# Patient Record
Sex: Female | Born: 1935 | Race: Black or African American | Hispanic: No | Smoking: Never smoker
Health system: Southern US, Community
[De-identification: ages and names within clinical notes are randomized; demographics above are authoritative.]

## PROBLEM LIST (undated history)

## (undated) DIAGNOSIS — I10 Essential (primary) hypertension: Secondary | ICD-10-CM

## (undated) DIAGNOSIS — Z9109 Other allergy status, other than to drugs and biological substances: Secondary | ICD-10-CM

## (undated) DIAGNOSIS — R7989 Other specified abnormal findings of blood chemistry: Secondary | ICD-10-CM

## (undated) DIAGNOSIS — E871 Hypo-osmolality and hyponatremia: Secondary | ICD-10-CM

## (undated) DIAGNOSIS — K219 Gastro-esophageal reflux disease without esophagitis: Secondary | ICD-10-CM

## (undated) DIAGNOSIS — N390 Urinary tract infection, site not specified: Secondary | ICD-10-CM

## (undated) DIAGNOSIS — R011 Cardiac murmur, unspecified: Secondary | ICD-10-CM

## (undated) DIAGNOSIS — K8020 Calculus of gallbladder without cholecystitis without obstruction: Secondary | ICD-10-CM

## (undated) DIAGNOSIS — J309 Allergic rhinitis, unspecified: Secondary | ICD-10-CM

## (undated) DIAGNOSIS — M1990 Unspecified osteoarthritis, unspecified site: Secondary | ICD-10-CM

## (undated) DIAGNOSIS — I2510 Atherosclerotic heart disease of native coronary artery without angina pectoris: Secondary | ICD-10-CM

## (undated) DIAGNOSIS — D499 Neoplasm of unspecified behavior of unspecified site: Principal | ICD-10-CM

## (undated) DIAGNOSIS — B351 Tinea unguium: Secondary | ICD-10-CM

## (undated) DIAGNOSIS — C801 Malignant (primary) neoplasm, unspecified: Secondary | ICD-10-CM

## (undated) HISTORY — DX: Allergic rhinitis, unspecified: J30.9

## (undated) HISTORY — PX: COLON SURGERY: SHX602

## (undated) HISTORY — PX: SMALL BOWEL REPAIR: SHX6447

## (undated) HISTORY — DX: Atherosclerotic heart disease of native coronary artery without angina pectoris: I25.10

## (undated) HISTORY — DX: Essential (primary) hypertension: I10

## (undated) HISTORY — DX: Tinea unguium: B35.1

## (undated) HISTORY — DX: Gastro-esophageal reflux disease without esophagitis: K21.9

## (undated) HISTORY — PX: CARDIAC SURGERY: SHX584

## (undated) HISTORY — DX: Neoplasm of unspecified behavior of unspecified site: D49.9

## (undated) HISTORY — DX: Cardiac murmur, unspecified: R01.1

## (undated) HISTORY — PX: TONSILLECTOMY: SUR1361

## (undated) HISTORY — DX: Calculus of gallbladder without cholecystitis without obstruction: K80.20

## (undated) HISTORY — DX: Other specified abnormal findings of blood chemistry: R79.89

## (undated) HISTORY — DX: Hypo-osmolality and hyponatremia: E87.1

---

## 2000-03-16 ENCOUNTER — Emergency Department (HOSPITAL_COMMUNITY): Admission: EM | Admit: 2000-03-16 | Discharge: 2000-03-16 | Payer: Self-pay | Admitting: Emergency Medicine

## 2000-11-01 ENCOUNTER — Encounter: Payer: Self-pay | Admitting: Emergency Medicine

## 2000-11-01 ENCOUNTER — Inpatient Hospital Stay (HOSPITAL_COMMUNITY): Admission: EM | Admit: 2000-11-01 | Discharge: 2000-11-19 | Payer: Self-pay | Admitting: Emergency Medicine

## 2000-11-01 ENCOUNTER — Encounter (INDEPENDENT_AMBULATORY_CARE_PROVIDER_SITE_OTHER): Payer: Self-pay

## 2000-11-02 ENCOUNTER — Encounter: Payer: Self-pay | Admitting: Internal Medicine

## 2000-11-11 ENCOUNTER — Encounter: Payer: Self-pay | Admitting: General Surgery

## 2000-11-12 ENCOUNTER — Encounter: Payer: Self-pay | Admitting: General Surgery

## 2000-11-14 ENCOUNTER — Encounter: Payer: Self-pay | Admitting: General Surgery

## 2000-12-04 ENCOUNTER — Encounter: Payer: Self-pay | Admitting: General Surgery

## 2000-12-04 ENCOUNTER — Encounter: Admission: RE | Admit: 2000-12-04 | Discharge: 2000-12-04 | Payer: Self-pay | Admitting: General Surgery

## 2001-04-01 ENCOUNTER — Encounter: Admission: RE | Admit: 2001-04-01 | Discharge: 2001-04-01 | Payer: Self-pay | Admitting: *Deleted

## 2001-04-01 ENCOUNTER — Encounter: Payer: Self-pay | Admitting: *Deleted

## 2001-09-14 ENCOUNTER — Encounter: Payer: Self-pay | Admitting: *Deleted

## 2001-09-14 ENCOUNTER — Ambulatory Visit (HOSPITAL_COMMUNITY): Admission: RE | Admit: 2001-09-14 | Discharge: 2001-09-14 | Payer: Self-pay | Admitting: *Deleted

## 2002-04-09 ENCOUNTER — Inpatient Hospital Stay (HOSPITAL_COMMUNITY): Admission: EM | Admit: 2002-04-09 | Discharge: 2002-04-11 | Payer: Self-pay | Admitting: Emergency Medicine

## 2002-04-09 ENCOUNTER — Encounter: Payer: Self-pay | Admitting: Emergency Medicine

## 2002-04-10 ENCOUNTER — Encounter: Payer: Self-pay | Admitting: Internal Medicine

## 2002-07-23 ENCOUNTER — Ambulatory Visit (HOSPITAL_COMMUNITY): Admission: RE | Admit: 2002-07-23 | Discharge: 2002-07-23 | Payer: Self-pay | Admitting: *Deleted

## 2002-07-23 ENCOUNTER — Encounter: Payer: Self-pay | Admitting: *Deleted

## 2004-06-18 ENCOUNTER — Encounter: Admission: RE | Admit: 2004-06-18 | Discharge: 2004-06-18 | Payer: Self-pay | Admitting: Internal Medicine

## 2004-08-22 ENCOUNTER — Encounter
Admission: RE | Admit: 2004-08-22 | Discharge: 2004-10-24 | Payer: Self-pay | Admitting: Physical Medicine & Rehabilitation

## 2004-08-24 ENCOUNTER — Ambulatory Visit: Payer: Self-pay | Admitting: Physical Medicine & Rehabilitation

## 2004-09-03 ENCOUNTER — Encounter
Admission: RE | Admit: 2004-09-03 | Discharge: 2004-09-26 | Payer: Self-pay | Admitting: Physical Medicine & Rehabilitation

## 2004-09-03 ENCOUNTER — Encounter
Admission: RE | Admit: 2004-09-03 | Discharge: 2004-09-03 | Payer: Self-pay | Admitting: Physical Medicine & Rehabilitation

## 2004-09-27 ENCOUNTER — Ambulatory Visit: Payer: Self-pay | Admitting: Internal Medicine

## 2004-11-01 ENCOUNTER — Ambulatory Visit: Payer: Self-pay | Admitting: Internal Medicine

## 2004-11-12 ENCOUNTER — Ambulatory Visit: Payer: Self-pay | Admitting: Internal Medicine

## 2005-01-03 ENCOUNTER — Ambulatory Visit: Payer: Self-pay | Admitting: Internal Medicine

## 2005-02-11 ENCOUNTER — Ambulatory Visit: Payer: Self-pay | Admitting: Internal Medicine

## 2005-02-15 ENCOUNTER — Ambulatory Visit: Payer: Self-pay | Admitting: Internal Medicine

## 2005-05-01 ENCOUNTER — Ambulatory Visit: Payer: Self-pay | Admitting: Internal Medicine

## 2005-05-14 ENCOUNTER — Ambulatory Visit: Payer: Self-pay | Admitting: Internal Medicine

## 2005-05-17 ENCOUNTER — Ambulatory Visit: Payer: Self-pay | Admitting: Cardiology

## 2005-05-28 ENCOUNTER — Ambulatory Visit: Payer: Self-pay | Admitting: Internal Medicine

## 2005-06-25 ENCOUNTER — Ambulatory Visit: Payer: Self-pay | Admitting: Internal Medicine

## 2005-07-23 ENCOUNTER — Ambulatory Visit: Payer: Self-pay | Admitting: Internal Medicine

## 2005-08-07 ENCOUNTER — Ambulatory Visit: Payer: Self-pay | Admitting: Internal Medicine

## 2005-09-04 ENCOUNTER — Ambulatory Visit: Payer: Self-pay | Admitting: Internal Medicine

## 2005-10-03 ENCOUNTER — Ambulatory Visit: Payer: Self-pay | Admitting: Internal Medicine

## 2005-10-10 ENCOUNTER — Ambulatory Visit: Payer: Self-pay | Admitting: Internal Medicine

## 2005-11-14 ENCOUNTER — Ambulatory Visit: Payer: Self-pay | Admitting: Internal Medicine

## 2005-11-28 ENCOUNTER — Ambulatory Visit: Payer: Self-pay | Admitting: Internal Medicine

## 2005-12-10 ENCOUNTER — Ambulatory Visit: Payer: Self-pay | Admitting: Internal Medicine

## 2005-12-24 ENCOUNTER — Ambulatory Visit: Payer: Self-pay | Admitting: Pulmonary Disease

## 2006-01-03 ENCOUNTER — Ambulatory Visit: Payer: Self-pay | Admitting: Internal Medicine

## 2006-02-05 ENCOUNTER — Ambulatory Visit: Payer: Self-pay | Admitting: Internal Medicine

## 2006-03-19 ENCOUNTER — Ambulatory Visit: Payer: Self-pay | Admitting: Internal Medicine

## 2006-06-19 ENCOUNTER — Ambulatory Visit: Payer: Self-pay | Admitting: Internal Medicine

## 2006-06-26 ENCOUNTER — Ambulatory Visit: Payer: Self-pay | Admitting: Internal Medicine

## 2006-07-02 ENCOUNTER — Ambulatory Visit: Payer: Self-pay | Admitting: Cardiovascular Disease

## 2006-07-04 ENCOUNTER — Ambulatory Visit: Payer: Self-pay | Admitting: Internal Medicine

## 2006-07-15 ENCOUNTER — Ambulatory Visit: Payer: Self-pay | Admitting: Gastroenterology

## 2006-08-18 ENCOUNTER — Ambulatory Visit: Payer: Self-pay | Admitting: Internal Medicine

## 2006-10-01 ENCOUNTER — Ambulatory Visit: Payer: Self-pay | Admitting: Internal Medicine

## 2006-10-01 LAB — CONVERTED CEMR LAB
Basophils Absolute: 0 10*3/uL (ref 0.0–0.1)
Basophils Relative: 0.1 % (ref 0.0–1.0)
Eosinophil percent: 1.6 % (ref 0.0–5.0)
HCT: 32 % — ABNORMAL LOW (ref 36.0–46.0)
Hemoglobin: 10.7 g/dL — ABNORMAL LOW (ref 12.0–15.0)
Lymphocytes Relative: 28.8 % (ref 12.0–46.0)
MCHC: 33.4 g/dL (ref 30.0–36.0)
MCV: 95.7 fL (ref 78.0–100.0)
Monocytes Absolute: 0.5 10*3/uL (ref 0.2–0.7)
Monocytes Relative: 12.8 % — ABNORMAL HIGH (ref 3.0–11.0)
Neutro Abs: 2.2 10*3/uL (ref 1.4–7.7)
Neutrophils Relative %: 56.7 % (ref 43.0–77.0)
Platelets: 382 10*3/uL (ref 150–400)
RBC: 3.34 M/uL — ABNORMAL LOW (ref 3.87–5.11)
RDW: 13.3 % (ref 11.5–14.6)
WBC: 4 10*3/uL — ABNORMAL LOW (ref 4.5–10.5)

## 2006-11-12 ENCOUNTER — Ambulatory Visit: Payer: Self-pay | Admitting: Internal Medicine

## 2006-11-12 LAB — CONVERTED CEMR LAB
ALT: 17 units/L (ref 0–40)
AST: 22 units/L (ref 0–37)
Albumin: 3.6 g/dL (ref 3.5–5.2)
Alkaline Phosphatase: 120 units/L — ABNORMAL HIGH (ref 39–117)
BUN: 14 mg/dL (ref 6–23)
Basophils Absolute: 0 10*3/uL (ref 0.0–0.1)
Basophils Relative: 0.4 % (ref 0.0–1.0)
Bilirubin, Direct: 0.1 mg/dL (ref 0.0–0.3)
CO2: 30 meq/L (ref 19–32)
Calcium: 9.3 mg/dL (ref 8.4–10.5)
Chloride: 102 meq/L (ref 96–112)
Cholesterol: 239 mg/dL (ref 0–200)
Creatinine, Ser: 1.4 mg/dL — ABNORMAL HIGH (ref 0.4–1.2)
Direct LDL: 166.8 mg/dL
Eosinophils Absolute: 0.1 10*3/uL (ref 0.0–0.6)
Eosinophils Relative: 2 % (ref 0.0–5.0)
GFR calc Af Amer: 46 mL/min
GFR calc non Af Amer: 38 mL/min
Glucose, Bld: 104 mg/dL — ABNORMAL HIGH (ref 70–99)
HCT: 32 % — ABNORMAL LOW (ref 36.0–46.0)
HDL: 53.6 mg/dL (ref >39.0)
Hemoglobin: 11 g/dL — ABNORMAL LOW (ref 12.0–15.0)
Lymphocytes Relative: 29.2 % (ref 12.0–46.0)
MCHC: 34.4 g/dL (ref 30.0–36.0)
MCV: 95.3 fL (ref 78.0–100.0)
Monocytes Absolute: 0.5 10*3/uL (ref 0.2–0.7)
Monocytes Relative: 12.7 % — ABNORMAL HIGH (ref 3.0–11.0)
Neutro Abs: 2.1 10*3/uL (ref 1.4–7.7)
Neutrophils Relative %: 55.7 % (ref 43.0–77.0)
Platelets: 394 10*3/uL (ref 150–400)
Potassium: 4 meq/L (ref 3.5–5.1)
RBC: 3.36 M/uL — ABNORMAL LOW (ref 3.87–5.11)
RDW: 13.3 % (ref 11.5–14.6)
Sodium: 137 meq/L (ref 135–145)
Total Bilirubin: 0.8 mg/dL (ref 0.3–1.2)
Total CHOL/HDL Ratio: 4.5
Total Protein: 7.1 g/dL (ref 6.0–8.3)
Triglycerides: 86 mg/dL (ref 0–149)
VLDL: 17 mg/dL (ref 0–40)
WBC: 3.8 10*3/uL — ABNORMAL LOW (ref 4.5–10.5)

## 2007-02-11 ENCOUNTER — Ambulatory Visit: Payer: Self-pay | Admitting: Internal Medicine

## 2007-02-11 LAB — CONVERTED CEMR LAB
ALT: 12 units/L (ref 0–40)
AST: 20 units/L (ref 0–37)
Albumin: 3.6 g/dL (ref 3.5–5.2)
Alkaline Phosphatase: 103 units/L (ref 39–117)
BUN: 17 mg/dL (ref 6–23)
Basophils Absolute: 0 10*3/uL (ref 0.0–0.1)
Basophils Relative: 0.1 % (ref 0.0–1.0)
Bilirubin, Direct: 0.1 mg/dL (ref 0.0–0.3)
CO2: 29 meq/L (ref 19–32)
Calcium: 9.2 mg/dL (ref 8.4–10.5)
Chloride: 103 meq/L (ref 96–112)
Creatinine, Ser: 1.4 mg/dL — ABNORMAL HIGH (ref 0.4–1.2)
Eosinophils Absolute: 0.1 10*3/uL (ref 0.0–0.6)
Eosinophils Relative: 1.5 % (ref 0.0–5.0)
Ferritin: 65.6 ng/mL (ref 10.0–291.0)
Folate: 18.1 ng/mL
GFR calc Af Amer: 46 mL/min
GFR calc non Af Amer: 38 mL/min
Glucose, Bld: 94 mg/dL (ref 70–99)
HCT: 30.1 % — ABNORMAL LOW (ref 36.0–46.0)
Hemoglobin: 10.4 g/dL — ABNORMAL LOW (ref 12.0–15.0)
Lymphocytes Relative: 26.6 % (ref 12.0–46.0)
MCHC: 34.6 g/dL (ref 30.0–36.0)
MCV: 92.1 fL (ref 78.0–100.0)
Monocytes Absolute: 0.5 10*3/uL (ref 0.2–0.7)
Monocytes Relative: 10.9 % (ref 3.0–11.0)
Neutro Abs: 2.6 10*3/uL (ref 1.4–7.7)
Neutrophils Relative %: 60.9 % (ref 43.0–77.0)
Platelets: 376 10*3/uL (ref 150–400)
Potassium: 4 meq/L (ref 3.5–5.1)
RBC: 3.27 M/uL — ABNORMAL LOW (ref 3.87–5.11)
RDW: 13.4 % (ref 11.5–14.6)
Sodium: 138 meq/L (ref 135–145)
Total Bilirubin: 0.7 mg/dL (ref 0.3–1.2)
Total Protein: 7.2 g/dL (ref 6.0–8.3)
Vitamin B-12: 427 pg/mL (ref 211–911)
WBC: 4.3 10*3/uL — ABNORMAL LOW (ref 4.5–10.5)

## 2007-02-26 ENCOUNTER — Ambulatory Visit: Payer: Self-pay | Admitting: Internal Medicine

## 2007-04-09 ENCOUNTER — Ambulatory Visit: Payer: Self-pay | Admitting: Internal Medicine

## 2007-04-13 DIAGNOSIS — I10 Essential (primary) hypertension: Secondary | ICD-10-CM

## 2007-04-13 DIAGNOSIS — R7989 Other specified abnormal findings of blood chemistry: Secondary | ICD-10-CM

## 2007-04-13 DIAGNOSIS — J309 Allergic rhinitis, unspecified: Secondary | ICD-10-CM

## 2007-04-13 DIAGNOSIS — D499 Neoplasm of unspecified behavior of unspecified site: Secondary | ICD-10-CM

## 2007-04-13 DIAGNOSIS — K219 Gastro-esophageal reflux disease without esophagitis: Secondary | ICD-10-CM

## 2007-04-13 DIAGNOSIS — C7A00 Malignant carcinoid tumor of unspecified site: Secondary | ICD-10-CM

## 2007-04-13 HISTORY — DX: Allergic rhinitis, unspecified: J30.9

## 2007-04-13 HISTORY — DX: Essential (primary) hypertension: I10

## 2007-04-13 HISTORY — DX: Neoplasm of unspecified behavior of unspecified site: D49.9

## 2007-04-13 HISTORY — DX: Other specified abnormal findings of blood chemistry: R79.89

## 2007-04-13 HISTORY — DX: Gastro-esophageal reflux disease without esophagitis: K21.9

## 2007-04-20 ENCOUNTER — Ambulatory Visit: Payer: Self-pay | Admitting: Internal Medicine

## 2007-06-12 ENCOUNTER — Ambulatory Visit: Payer: Self-pay | Admitting: Internal Medicine

## 2007-06-12 LAB — CONVERTED CEMR LAB
BUN: 16 mg/dL (ref 6–23)
CO2: 28 meq/L (ref 19–32)
Calcium: 9.4 mg/dL (ref 8.4–10.5)
Chloride: 99 meq/L (ref 96–112)
Creatinine, Ser: 1.4 mg/dL — ABNORMAL HIGH (ref 0.4–1.2)
GFR calc Af Amer: 46 mL/min
GFR calc non Af Amer: 38 mL/min
Glucose, Bld: 99 mg/dL (ref 70–99)
Hgb A1c MFr Bld: 5.8 % (ref 4.6–6.0)
Potassium: 4.4 meq/L (ref 3.5–5.1)
Sodium: 135 meq/L (ref 135–145)

## 2007-07-20 ENCOUNTER — Ambulatory Visit: Payer: Self-pay | Admitting: Internal Medicine

## 2007-07-20 LAB — CONVERTED CEMR LAB
BUN: 16 mg/dL (ref 6–23)
CO2: 28 meq/L (ref 19–32)
Calcium: 9.4 mg/dL (ref 8.4–10.5)
Chloride: 103 meq/L (ref 96–112)
Creatinine, Ser: 1.4 mg/dL — ABNORMAL HIGH (ref 0.4–1.2)
GFR calc Af Amer: 46 mL/min
GFR calc non Af Amer: 38 mL/min
Glucose, Bld: 99 mg/dL (ref 70–99)
Potassium: 4.4 meq/L (ref 3.5–5.1)
Sodium: 137 meq/L (ref 135–145)

## 2007-07-22 ENCOUNTER — Ambulatory Visit: Payer: Self-pay | Admitting: Cardiology

## 2007-08-06 ENCOUNTER — Ambulatory Visit: Payer: Self-pay | Admitting: Pulmonary Disease

## 2007-08-06 ENCOUNTER — Encounter: Payer: Self-pay | Admitting: Internal Medicine

## 2007-08-19 ENCOUNTER — Ambulatory Visit: Payer: Self-pay | Admitting: Internal Medicine

## 2007-08-20 ENCOUNTER — Ambulatory Visit: Payer: Self-pay | Admitting: Oncology

## 2007-08-20 LAB — CONVERTED CEMR LAB
Basophils Absolute: 0 10*3/uL (ref 0.0–0.1)
Basophils Relative: 1 % (ref 0.0–1.0)
Eosinophils Absolute: 0 10*3/uL (ref 0.0–0.6)
Eosinophils Relative: 1 % (ref 0.0–5.0)
HCT: 31.1 % — ABNORMAL LOW (ref 36.0–46.0)
Hemoglobin: 10.7 g/dL — ABNORMAL LOW (ref 12.0–15.0)
Lymphocytes Relative: 31.8 % (ref 12.0–46.0)
MCHC: 34.4 g/dL (ref 30.0–36.0)
MCV: 95.1 fL (ref 78.0–100.0)
Monocytes Absolute: 0.5 10*3/uL (ref 0.2–0.7)
Monocytes Relative: 11 % (ref 3.0–11.0)
Neutro Abs: 2.7 10*3/uL (ref 1.4–7.7)
Neutrophils Relative %: 55.2 % (ref 43.0–77.0)
Platelets: 378 10*3/uL (ref 150–400)
RBC: 3.27 M/uL — ABNORMAL LOW (ref 3.87–5.11)
RDW: 13.4 % (ref 11.5–14.6)
WBC: 4.7 10*3/uL (ref 4.5–10.5)

## 2007-08-21 ENCOUNTER — Telehealth: Payer: Self-pay | Admitting: Internal Medicine

## 2007-09-01 ENCOUNTER — Ambulatory Visit: Payer: Self-pay | Admitting: Oncology

## 2007-10-09 ENCOUNTER — Encounter: Payer: Self-pay | Admitting: Internal Medicine

## 2007-10-09 LAB — CBC WITH DIFFERENTIAL/PLATELET
BASO%: 0.1 % (ref 0.0–2.0)
EOS%: 1.8 % (ref 0.0–7.0)
MCH: 32 pg (ref 26.0–34.0)
MCHC: 34.8 g/dL (ref 32.0–36.0)
MONO#: 0.1 10*3/uL (ref 0.1–0.9)
RBC: 3.36 10*6/uL — ABNORMAL LOW (ref 3.70–5.32)
WBC: 4.7 10*3/uL (ref 3.9–10.0)
lymph#: 0.8 10*3/uL — ABNORMAL LOW (ref 0.9–3.3)

## 2007-10-09 LAB — CHCC SMEAR

## 2007-10-09 LAB — RETICULOCYTES: Retic %: 1.7 % (ref 0.4–2.3)

## 2007-10-13 LAB — COMPREHENSIVE METABOLIC PANEL
ALT: 13 U/L (ref 0–35)
AST: 17 U/L (ref 0–37)
Albumin: 4 g/dL (ref 3.5–5.2)
Alkaline Phosphatase: 126 U/L — ABNORMAL HIGH (ref 39–117)
BUN: 21 mg/dL (ref 6–23)
Calcium: 9.5 mg/dL (ref 8.4–10.5)
Chloride: 103 mEq/L (ref 96–112)
Creatinine, Ser: 1.22 mg/dL — ABNORMAL HIGH (ref 0.40–1.20)
Potassium: 4.3 mEq/L (ref 3.5–5.3)

## 2007-10-13 LAB — SPEP & IFE WITH QIG
Alpha-2-Globulin: 13 % — ABNORMAL HIGH (ref 7.1–11.8)
Beta Globulin: 7.4 % — ABNORMAL HIGH (ref 4.7–7.2)
Gamma Globulin: 15.5 % (ref 11.1–18.8)
IgA: 636 mg/dL — ABNORMAL HIGH (ref 68–378)
IgM, Serum: 117 mg/dL (ref 60–263)
Total Protein, Serum Electrophoresis: 7.8 g/dL (ref 6.0–8.3)

## 2007-11-19 ENCOUNTER — Ambulatory Visit: Payer: Self-pay | Admitting: Internal Medicine

## 2007-11-19 LAB — CONVERTED CEMR LAB
BUN: 14 mg/dL (ref 6–23)
Basophils Relative: 0.3 % (ref 0.0–1.0)
CO2: 26 meq/L (ref 19–32)
Calcium: 9.5 mg/dL (ref 8.4–10.5)
Chloride: 104 meq/L (ref 96–112)
Creatinine, Ser: 1.3 mg/dL — ABNORMAL HIGH (ref 0.4–1.2)
Eosinophils Relative: 2 % (ref 0.0–5.0)
GFR calc Af Amer: 50 mL/min
GFR calc non Af Amer: 41 mL/min
Glucose, Bld: 93 mg/dL (ref 70–99)
HCT: 32.6 % — ABNORMAL LOW (ref 36.0–46.0)
Hemoglobin: 10.7 g/dL — ABNORMAL LOW (ref 12.0–15.0)
Hgb A1c MFr Bld: 6 % (ref 4.6–6.0)
Lymphocytes Relative: 18.4 % (ref 12.0–46.0)
MCHC: 32.7 g/dL (ref 30.0–36.0)
MCV: 93.8 fL (ref 78.0–100.0)
Monocytes Relative: 7.9 % (ref 3.0–11.0)
Neutrophils Relative %: 71.4 % (ref 43.0–77.0)
Platelets: 431 10*3/uL — ABNORMAL HIGH (ref 150–400)
Potassium: 4.3 meq/L (ref 3.5–5.1)
RBC: 3.47 M/uL — ABNORMAL LOW (ref 3.87–5.11)
RDW: 13.1 % (ref 11.5–14.6)
Sodium: 139 meq/L (ref 135–145)
WBC: 6.8 10*3/uL (ref 4.5–10.5)

## 2007-11-27 ENCOUNTER — Ambulatory Visit: Payer: Self-pay | Admitting: Internal Medicine

## 2008-01-12 ENCOUNTER — Ambulatory Visit: Payer: Self-pay | Admitting: Oncology

## 2008-01-14 ENCOUNTER — Encounter: Payer: Self-pay | Admitting: Internal Medicine

## 2008-03-22 ENCOUNTER — Ambulatory Visit: Payer: Self-pay | Admitting: Internal Medicine

## 2008-03-22 LAB — CONVERTED CEMR LAB
BUN: 21 mg/dL (ref 6–23)
Basophils Absolute: 0 10*3/uL (ref 0.0–0.1)
Basophils Relative: 0.5 % (ref 0.0–1.0)
CO2: 28 meq/L (ref 19–32)
Calcium: 9.6 mg/dL (ref 8.4–10.5)
Chloride: 98 meq/L (ref 96–112)
Creatinine, Ser: 1.5 mg/dL — ABNORMAL HIGH (ref 0.4–1.2)
Eosinophils Absolute: 0.1 10*3/uL (ref 0.0–0.7)
Eosinophils Relative: 1.8 % (ref 0.0–5.0)
GFR calc Af Amer: 42 mL/min
GFR calc non Af Amer: 35 mL/min
Glucose, Bld: 99 mg/dL (ref 70–99)
HCT: 31.8 % — ABNORMAL LOW (ref 36.0–46.0)
Hemoglobin: 10.5 g/dL — ABNORMAL LOW (ref 12.0–15.0)
Hgb A1c MFr Bld: 5.9 % (ref 4.6–6.0)
Lymphocytes Relative: 26.3 % (ref 12.0–46.0)
MCHC: 33 g/dL (ref 30.0–36.0)
MCV: 94.5 fL (ref 78.0–100.0)
Monocytes Absolute: 0.4 10*3/uL (ref 0.1–1.0)
Monocytes Relative: 10.2 % (ref 3.0–12.0)
Neutro Abs: 2.6 10*3/uL (ref 1.4–7.7)
Neutrophils Relative %: 61.2 % (ref 43.0–77.0)
Platelets: 358 10*3/uL (ref 150–400)
Potassium: 3.9 meq/L (ref 3.5–5.1)
RBC: 3.36 M/uL — ABNORMAL LOW (ref 3.87–5.11)
RDW: 14.5 % (ref 11.5–14.6)
Sodium: 138 meq/L (ref 135–145)
WBC: 4.2 10*3/uL — ABNORMAL LOW (ref 4.5–10.5)

## 2008-04-08 ENCOUNTER — Telehealth: Payer: Self-pay | Admitting: Internal Medicine

## 2008-07-12 ENCOUNTER — Ambulatory Visit: Payer: Self-pay | Admitting: Oncology

## 2008-07-29 ENCOUNTER — Ambulatory Visit: Payer: Self-pay | Admitting: Internal Medicine

## 2008-08-01 LAB — CONVERTED CEMR LAB
ALT: 16 units/L (ref 0–35)
AST: 23 units/L (ref 0–37)
BUN: 21 mg/dL (ref 6–23)
Basophils Absolute: 0 10*3/uL (ref 0.0–0.1)
Basophils Relative: 0.2 % (ref 0.0–3.0)
CO2: 27 meq/L (ref 19–32)
Calcium: 9.6 mg/dL (ref 8.4–10.5)
Chloride: 105 meq/L (ref 96–112)
Cholesterol: 226 mg/dL (ref 0–200)
Creatinine, Ser: 1.4 mg/dL — ABNORMAL HIGH (ref 0.4–1.2)
Direct LDL: 136 mg/dL
Eosinophils Absolute: 0.1 10*3/uL (ref 0.0–0.7)
Eosinophils Relative: 2.4 % (ref 0.0–5.0)
GFR calc Af Amer: 46 mL/min
GFR calc non Af Amer: 38 mL/min
Glucose, Bld: 97 mg/dL (ref 70–99)
HCT: 32.1 % — ABNORMAL LOW (ref 36.0–46.0)
HDL: 64.2 mg/dL (ref >39.0)
Hemoglobin: 10.9 g/dL — ABNORMAL LOW (ref 12.0–15.0)
Hgb A1c MFr Bld: 6 % (ref 4.6–6.0)
Lymphocytes Relative: 30 % (ref 12.0–46.0)
MCHC: 34 g/dL (ref 30.0–36.0)
MCV: 95.3 fL (ref 78.0–100.0)
Monocytes Absolute: 0.5 10*3/uL (ref 0.1–1.0)
Monocytes Relative: 11.4 % (ref 3.0–12.0)
Neutro Abs: 2.3 10*3/uL (ref 1.4–7.7)
Neutrophils Relative %: 56 % (ref 43.0–77.0)
Platelets: 347 10*3/uL (ref 150–400)
Potassium: 4.3 meq/L (ref 3.5–5.1)
RBC: 3.36 M/uL — ABNORMAL LOW (ref 3.87–5.11)
RDW: 13.8 % (ref 11.5–14.6)
Sodium: 140 meq/L (ref 135–145)
Total CHOL/HDL Ratio: 3.5
Triglycerides: 82 mg/dL (ref 0–149)
VLDL: 16 mg/dL (ref 0–40)
WBC: 4.1 10*3/uL — ABNORMAL LOW (ref 4.5–10.5)

## 2008-08-15 ENCOUNTER — Encounter: Payer: Self-pay | Admitting: Internal Medicine

## 2008-08-15 LAB — CBC WITH DIFFERENTIAL/PLATELET
BASO%: 1 % (ref 0.0–2.0)
EOS%: 2.2 % (ref 0.0–7.0)
HCT: 31.2 % — ABNORMAL LOW (ref 34.8–46.6)
LYMPH%: 26.6 % (ref 14.0–48.0)
MCH: 31.9 pg (ref 26.0–34.0)
MCHC: 34.3 g/dL (ref 32.0–36.0)
MCV: 93.1 fL (ref 81.0–101.0)
MONO#: 0.6 10*3/uL (ref 0.1–0.9)
MONO%: 11.7 % (ref 0.0–13.0)
NEUT%: 58.6 % (ref 39.6–76.8)
Platelets: 369 10*3/uL (ref 145–400)
RBC: 3.36 10*6/uL — ABNORMAL LOW (ref 3.70–5.32)
WBC: 4.7 10*3/uL (ref 3.9–10.0)

## 2008-12-02 ENCOUNTER — Ambulatory Visit: Payer: Self-pay | Admitting: Internal Medicine

## 2008-12-08 ENCOUNTER — Telehealth: Payer: Self-pay | Admitting: Internal Medicine

## 2008-12-08 LAB — CONVERTED CEMR LAB
ALT: 17 units/L (ref 0–35)
AST: 22 units/L (ref 0–37)
BUN: 10 mg/dL (ref 6–23)
Basophils Absolute: 0.1 10*3/uL (ref 0.0–0.1)
Basophils Relative: 2.9 % (ref 0.0–3.0)
CO2: 28 meq/L (ref 19–32)
Calcium: 9.3 mg/dL (ref 8.4–10.5)
Chloride: 105 meq/L (ref 96–112)
Cholesterol: 222 mg/dL (ref 0–200)
Creatinine, Ser: 1.3 mg/dL — ABNORMAL HIGH (ref 0.4–1.2)
Direct LDL: 141.6 mg/dL
Eosinophils Absolute: 0.1 10*3/uL (ref 0.0–0.7)
Eosinophils Relative: 1.8 % (ref 0.0–5.0)
GFR calc Af Amer: 50 mL/min
GFR calc non Af Amer: 41 mL/min
Glucose, Bld: 101 mg/dL — ABNORMAL HIGH (ref 70–99)
HCT: 31.9 % — ABNORMAL LOW (ref 36.0–46.0)
HDL: 63.8 mg/dL (ref >39.0)
Hemoglobin: 10.8 g/dL — ABNORMAL LOW (ref 12.0–15.0)
Hgb A1c MFr Bld: 6 % (ref 4.6–6.0)
LDL Cholesterol: 147 mg/dL — ABNORMAL HIGH (ref 0–99)
Lymphocytes Relative: 26.8 % (ref 12.0–46.0)
MCHC: 34 g/dL (ref 30.0–36.0)
MCV: 96.7 fL (ref 78.0–100.0)
Monocytes Absolute: 0.4 10*3/uL (ref 0.1–1.0)
Monocytes Relative: 9.6 % (ref 3.0–12.0)
Neutro Abs: 2.6 10*3/uL (ref 1.4–7.7)
Neutrophils Relative %: 58.9 % (ref 43.0–77.0)
Platelets: 354 10*3/uL (ref 150–400)
Potassium: 4.6 meq/L (ref 3.5–5.1)
RBC: 3.3 M/uL — ABNORMAL LOW (ref 3.87–5.11)
RDW: 13.7 % (ref 11.5–14.6)
Sodium: 139 meq/L (ref 135–145)
Total CHOL/HDL Ratio: 3.5
Triglycerides: 57 mg/dL (ref 0–149)
VLDL: 11 mg/dL (ref 0–40)
WBC: 4.4 10*3/uL — ABNORMAL LOW (ref 4.5–10.5)

## 2009-01-11 ENCOUNTER — Telehealth: Payer: Self-pay | Admitting: Internal Medicine

## 2009-01-24 ENCOUNTER — Emergency Department (HOSPITAL_COMMUNITY): Admission: EM | Admit: 2009-01-24 | Discharge: 2009-01-24 | Payer: Self-pay | Admitting: Emergency Medicine

## 2009-01-26 ENCOUNTER — Ambulatory Visit: Payer: Self-pay | Admitting: Family Medicine

## 2009-02-09 ENCOUNTER — Ambulatory Visit: Payer: Self-pay | Admitting: Oncology

## 2009-02-13 ENCOUNTER — Encounter: Payer: Self-pay | Admitting: Internal Medicine

## 2009-02-13 LAB — CBC WITH DIFFERENTIAL/PLATELET
Basophils Absolute: 0 10*3/uL (ref 0.0–0.1)
EOS%: 2 % (ref 0.0–7.0)
HGB: 10.8 g/dL — ABNORMAL LOW (ref 11.6–15.9)
LYMPH%: 22.7 % (ref 14.0–49.7)
MCH: 30.3 pg (ref 25.1–34.0)
MCV: 89.3 fL (ref 79.5–101.0)
MONO%: 15.2 % — ABNORMAL HIGH (ref 0.0–14.0)
Platelets: 462 10*3/uL — ABNORMAL HIGH (ref 145–400)
RDW: 13.4 % (ref 11.2–14.5)

## 2009-02-17 ENCOUNTER — Ambulatory Visit: Payer: Self-pay | Admitting: Internal Medicine

## 2009-02-22 ENCOUNTER — Telehealth: Payer: Self-pay | Admitting: Internal Medicine

## 2009-03-03 ENCOUNTER — Encounter: Payer: Self-pay | Admitting: Internal Medicine

## 2009-03-11 ENCOUNTER — Emergency Department (HOSPITAL_COMMUNITY): Admission: EM | Admit: 2009-03-11 | Discharge: 2009-03-11 | Payer: Self-pay | Admitting: Emergency Medicine

## 2009-06-23 ENCOUNTER — Ambulatory Visit: Payer: Self-pay | Admitting: Internal Medicine

## 2009-07-03 LAB — CONVERTED CEMR LAB
BUN: 18 mg/dL (ref 6–23)
Basophils Absolute: 0 10*3/uL (ref 0.0–0.1)
Basophils Relative: 0.4 % (ref 0.0–3.0)
CO2: 29 meq/L (ref 19–32)
Calcium: 9.3 mg/dL (ref 8.4–10.5)
Chloride: 104 meq/L (ref 96–112)
Creatinine, Ser: 1.4 mg/dL — ABNORMAL HIGH (ref 0.4–1.2)
Eosinophils Absolute: 0.1 10*3/uL (ref 0.0–0.7)
Eosinophils Relative: 1.7 % (ref 0.0–5.0)
GFR calc non Af Amer: 45.69 mL/min (ref >60)
Glucose, Bld: 95 mg/dL (ref 70–99)
HCT: 32.6 % — ABNORMAL LOW (ref 36.0–46.0)
Hemoglobin: 11.1 g/dL — ABNORMAL LOW (ref 12.0–15.0)
Lymphocytes Relative: 34.9 % (ref 12.0–46.0)
Lymphs Abs: 1.5 10*3/uL (ref 0.7–4.0)
MCHC: 34.1 g/dL (ref 30.0–36.0)
MCV: 94 fL (ref 78.0–100.0)
Monocytes Absolute: 0.5 10*3/uL (ref 0.1–1.0)
Monocytes Relative: 11.8 % (ref 3.0–12.0)
Neutro Abs: 2.2 10*3/uL (ref 1.4–7.7)
Neutrophils Relative %: 51.2 % (ref 43.0–77.0)
Platelets: 333 10*3/uL (ref 150.0–400.0)
Potassium: 4.3 meq/L (ref 3.5–5.1)
RBC: 3.46 M/uL — ABNORMAL LOW (ref 3.87–5.11)
RDW: 14.5 % (ref 11.5–14.6)
Sodium: 140 meq/L (ref 135–145)
TSH: 4.41 microintl units/mL (ref 0.35–5.50)
WBC: 4.3 10*3/uL — ABNORMAL LOW (ref 4.5–10.5)

## 2009-09-27 ENCOUNTER — Telehealth: Payer: Self-pay | Admitting: Internal Medicine

## 2009-10-17 ENCOUNTER — Telehealth: Payer: Self-pay | Admitting: Internal Medicine

## 2009-10-19 ENCOUNTER — Ambulatory Visit: Payer: Self-pay | Admitting: Internal Medicine

## 2009-10-24 LAB — CONVERTED CEMR LAB
BUN: 13 mg/dL (ref 6–23)
Basophils Absolute: 0 10*3/uL (ref 0.0–0.1)
Basophils Relative: 0.7 % (ref 0.0–3.0)
CO2: 27 meq/L (ref 19–32)
Calcium: 9.4 mg/dL (ref 8.4–10.5)
Chloride: 100 meq/L (ref 96–112)
Creatinine, Ser: 1.2 mg/dL (ref 0.4–1.2)
Eosinophils Absolute: 0.1 10*3/uL (ref 0.0–0.7)
Eosinophils Relative: 2.3 % (ref 0.0–5.0)
GFR calc non Af Amer: 54.55 mL/min (ref >60)
Glucose, Bld: 108 mg/dL — ABNORMAL HIGH (ref 70–99)
HCT: 35.5 % — ABNORMAL LOW (ref 36.0–46.0)
Hemoglobin: 12 g/dL (ref 12.0–15.0)
Lymphocytes Relative: 29.8 % (ref 12.0–46.0)
Lymphs Abs: 1.3 10*3/uL (ref 0.7–4.0)
MCHC: 33.8 g/dL (ref 30.0–36.0)
MCV: 97.7 fL (ref 78.0–100.0)
Monocytes Absolute: 0.5 10*3/uL (ref 0.1–1.0)
Monocytes Relative: 11.1 % (ref 3.0–12.0)
Neutro Abs: 2.3 10*3/uL (ref 1.4–7.7)
Neutrophils Relative %: 56.1 % (ref 43.0–77.0)
Platelets: 337 10*3/uL (ref 150.0–400.0)
Potassium: 4.4 meq/L (ref 3.5–5.1)
RBC: 3.64 M/uL — ABNORMAL LOW (ref 3.87–5.11)
RDW: 13.7 % (ref 11.5–14.6)
Sodium: 137 meq/L (ref 135–145)
TSH: 4.86 microintl units/mL (ref 0.35–5.50)
WBC: 4.2 10*3/uL — ABNORMAL LOW (ref 4.5–10.5)

## 2009-11-08 ENCOUNTER — Ambulatory Visit: Payer: Self-pay | Admitting: Oncology

## 2009-11-10 ENCOUNTER — Encounter: Payer: Self-pay | Admitting: Internal Medicine

## 2009-11-10 LAB — CBC WITH DIFFERENTIAL/PLATELET
BASO%: 0.5 % (ref 0.0–2.0)
Basophils Absolute: 0 10*3/uL (ref 0.0–0.1)
EOS%: 2.5 % (ref 0.0–7.0)
Eosinophils Absolute: 0.1 10*3/uL (ref 0.0–0.5)
HCT: 33.7 % — ABNORMAL LOW (ref 34.8–46.6)
HGB: 11.6 g/dL (ref 11.6–15.9)
LYMPH%: 29.1 % (ref 14.0–49.7)
MCH: 32.8 pg (ref 25.1–34.0)
MCHC: 34.5 g/dL (ref 31.5–36.0)
MCV: 95.2 fL (ref 79.5–101.0)
MONO#: 0.6 10*3/uL (ref 0.1–0.9)
MONO%: 13.6 % (ref 0.0–14.0)
NEUT#: 2.6 10*3/uL (ref 1.5–6.5)
NEUT%: 54.3 % (ref 38.4–76.8)
Platelets: 376 10*3/uL (ref 145–400)
RBC: 3.53 10*6/uL — ABNORMAL LOW (ref 3.70–5.45)
RDW: 14.4 % (ref 11.2–14.5)
WBC: 4.7 10*3/uL (ref 3.9–10.3)
lymph#: 1.4 10*3/uL (ref 0.9–3.3)

## 2010-02-05 ENCOUNTER — Ambulatory Visit: Payer: Self-pay | Admitting: Internal Medicine

## 2010-02-05 DIAGNOSIS — M549 Dorsalgia, unspecified: Secondary | ICD-10-CM | POA: Insufficient documentation

## 2010-02-08 ENCOUNTER — Telehealth: Payer: Self-pay | Admitting: Internal Medicine

## 2010-03-08 ENCOUNTER — Telehealth: Payer: Self-pay | Admitting: Internal Medicine

## 2010-04-19 ENCOUNTER — Emergency Department (HOSPITAL_COMMUNITY): Admission: EM | Admit: 2010-04-19 | Discharge: 2010-04-19 | Payer: Self-pay | Admitting: Emergency Medicine

## 2010-05-02 ENCOUNTER — Ambulatory Visit: Payer: Self-pay | Admitting: Internal Medicine

## 2010-05-02 DIAGNOSIS — K8020 Calculus of gallbladder without cholecystitis without obstruction: Secondary | ICD-10-CM | POA: Insufficient documentation

## 2010-05-02 HISTORY — DX: Calculus of gallbladder without cholecystitis without obstruction: K80.20

## 2010-06-04 ENCOUNTER — Ambulatory Visit: Payer: Self-pay | Admitting: Internal Medicine

## 2010-06-05 LAB — CONVERTED CEMR LAB
ALT: 18 units/L (ref 0–35)
AST: 21 units/L (ref 0–37)
Albumin: 3.9 g/dL (ref 3.5–5.2)
Alkaline Phosphatase: 120 units/L — ABNORMAL HIGH (ref 39–117)
BUN: 21 mg/dL (ref 6–23)
Basophils Absolute: 0 10*3/uL (ref 0.0–0.1)
Basophils Relative: 0.1 % (ref 0.0–3.0)
Bilirubin, Direct: 0.1 mg/dL (ref 0.0–0.3)
CO2: 25 meq/L (ref 19–32)
Calcium: 9.6 mg/dL (ref 8.4–10.5)
Chloride: 100 meq/L (ref 96–112)
Creatinine, Ser: 1.3 mg/dL — ABNORMAL HIGH (ref 0.4–1.2)
Eosinophils Absolute: 0.1 10*3/uL (ref 0.0–0.7)
Eosinophils Relative: 1.6 % (ref 0.0–5.0)
GFR calc non Af Amer: 49.66 mL/min (ref >60)
Glucose, Bld: 97 mg/dL (ref 70–99)
HCT: 33.6 % — ABNORMAL LOW (ref 36.0–46.0)
Hemoglobin: 11.5 g/dL — ABNORMAL LOW (ref 12.0–15.0)
Lymphocytes Relative: 26 % (ref 12.0–46.0)
Lymphs Abs: 1.2 10*3/uL (ref 0.7–4.0)
MCHC: 34.2 g/dL (ref 30.0–36.0)
MCV: 95.1 fL (ref 78.0–100.0)
Monocytes Absolute: 0.5 10*3/uL (ref 0.1–1.0)
Monocytes Relative: 10.5 % (ref 3.0–12.0)
Neutro Abs: 2.9 10*3/uL (ref 1.4–7.7)
Neutrophils Relative %: 61.8 % (ref 43.0–77.0)
Platelets: 388 10*3/uL (ref 150.0–400.0)
Potassium: 5 meq/L (ref 3.5–5.1)
RBC: 3.53 M/uL — ABNORMAL LOW (ref 3.87–5.11)
RDW: 15.1 % — ABNORMAL HIGH (ref 11.5–14.6)
Sodium: 135 meq/L (ref 135–145)
Total Bilirubin: 0.5 mg/dL (ref 0.3–1.2)
Total Protein: 7.1 g/dL (ref 6.0–8.3)
WBC: 4.7 10*3/uL (ref 4.5–10.5)

## 2010-07-03 ENCOUNTER — Telehealth: Payer: Self-pay | Admitting: Internal Medicine

## 2010-07-05 ENCOUNTER — Ambulatory Visit: Payer: Self-pay | Admitting: Oncology

## 2010-07-10 ENCOUNTER — Encounter: Payer: Self-pay | Admitting: Internal Medicine

## 2010-07-10 LAB — CBC WITH DIFFERENTIAL/PLATELET
BASO%: 0.4 % (ref 0.0–2.0)
EOS%: 3.4 % (ref 0.0–7.0)
HCT: 34.1 % — ABNORMAL LOW (ref 34.8–46.6)
MCH: 31.9 pg (ref 25.1–34.0)
MCHC: 33.7 g/dL (ref 31.5–36.0)
MONO#: 0.5 10*3/uL (ref 0.1–0.9)
RBC: 3.6 10*6/uL — ABNORMAL LOW (ref 3.70–5.45)
RDW: 15.2 % — ABNORMAL HIGH (ref 11.2–14.5)
WBC: 4.4 10*3/uL (ref 3.9–10.3)
lymph#: 1.3 10*3/uL (ref 0.9–3.3)

## 2010-07-19 ENCOUNTER — Ambulatory Visit: Payer: Self-pay | Admitting: Internal Medicine

## 2010-07-20 ENCOUNTER — Telehealth: Payer: Self-pay | Admitting: Internal Medicine

## 2010-07-26 ENCOUNTER — Encounter: Admission: RE | Admit: 2010-07-26 | Discharge: 2010-07-26 | Payer: Self-pay | Admitting: Internal Medicine

## 2010-08-13 ENCOUNTER — Ambulatory Visit: Payer: Self-pay | Admitting: Internal Medicine

## 2010-08-13 ENCOUNTER — Telehealth: Payer: Self-pay | Admitting: Internal Medicine

## 2010-08-13 LAB — CONVERTED CEMR LAB
IgA: 597 mg/dL — ABNORMAL HIGH (ref 68–378)
IgG (Immunoglobin G), Serum: 1110 mg/dL (ref 694–1618)
IgM, Serum: 95 mg/dL (ref 60–263)
Total Protein, Serum Electrophoresis: 7.6 g/dL (ref 6.0–8.3)

## 2010-10-05 ENCOUNTER — Ambulatory Visit: Payer: Self-pay | Admitting: Internal Medicine

## 2010-11-20 NOTE — Assessment & Plan Note (Signed)
Summary: f/u UTI/dm   Vital Signs:  Patient profile:   77 year old female Pulse rate:   84 / minute Pulse rhythm:   regular Resp:     12 per minute BP sitting:   128 / 68  (left arm) Cuff size:   regular  Vitals Entered By: Gladis Riffle, RN (May 02, 2010 11:16 AM) CC: went to ER for UTI--for FU plus was told she has gallstones Is Patient Diabetic? No Comments completed cipro 500mg  two times a day x 6 days--states still has frequency but no burning   CC:  went to ER for UTI--for FU plus was told she has gallstones.  History of Present Illness: recent ED visit--UTI  gallstones:IMPRESSION:   Cholelithiasis.  No sonographic evidence of acute cholecystitis. she went to ED with severe RUQ pain---seen by dr newman---has gall stones.   htn---All other systems reviewed and were negative   All other systems reviewed and were negative   Preventive Screening-Counseling & Management  Alcohol-Tobacco     Smoking Status: never  Current Medications (verified): 1)  Aspir-81 81 Mg Tbec (Aspirin) .... Take 1 Tablet By Mouth Once A Day 2)  Potassium Chloride Crys Cr 10 Meq Tbcr (Potassium Chloride Crys Cr) .... Take 1 Tablet By Mouth Once A Day 3)  Ferrous Fumarate 325 (106 Fe) Mg  Tabs (Ferrous Fumarate) .... Take 1 Tablet By Mouth Once A Day 4)  Oscal 500/200 D-3 500-200 Mg-Unit  Tabs (Calcium-Vitamin D) .... Qd 5)  Multivitamins   Tabs (Multiple Vitamin) .... Qd 6)  Clorazepate Dipotassium 3.75 Mg  Tabs (Clorazepate Dipotassium) .... Take 1 Tablet By Mouth Once A Day As Needed 7)  Diovan Hct 160-12.5 Mg  Tabs (Valsartan-Hydrochlorothiazide) .... One By Mouth Every Morning 8)  Zyrtec Allergy 10 Mg Tabs (Cetirizine Hcl) .... One By Mouth Daily 9)  Prilosec 20 Mg Cpdr (Omeprazole) .... Take 1 Tablet By Mouth Once A Day 10)  Hydrocodone-Acetaminophen 5-325 Mg Tabs (Hydrocodone-Acetaminophen) .Marland Kitchen.. 1 By Mouth Up To 4 Times Per Day As Needed For Pain  Allergies (verified): No Known Drug  Allergies  Past History:  Past Medical History: Last updated: 11/27/2007 Allergic rhinitis GERD Hypertension degenerative joint disease, OA carcinoid tumor increased glucose, hx of Anemia-iron deficiency---chronic---dr sherrill  Past Surgical History: Last updated: 06/12/2007 SB0--surgery--dx of carcinoid  Social History: Last updated: 06/12/2007 Retired Married Regular exercise-no  Risk Factors: Exercise: no (06/12/2007)  Risk Factors: Smoking Status: never (05/02/2010)  Physical Exam  General:  alert and well-developed.   Head:  normocephalic and atraumatic.   Eyes:  pupils equal and pupils round.   Neck:  No deformities, masses, or tenderness noted. Chest Wall:  no deformities and no tenderness.   Lungs:  normal respiratory effort and no intercostal retractions.   Abdomen:  nondistended. Normal bowel sounds. Soft and nontender. No hepatomegaly or splenomegaly noted   Impression & Recommendations:  Problem # 1:  CHOLELITHIASIS (ICD-574.20) discussed with patient and family memeber  i suspect her sxs were related to gall bladder dzs she refeuses to see surgeon at this time she swill call for any recurrent sxs  Problem # 2:  BACK PAIN (ICD-724.5) chronic Her updated medication list for this problem includes:    Aspir-81 81 Mg Tbec (Aspirin) .Marland Kitchen... Take 1 tablet by mouth once a day    Hydrocodone-acetaminophen 5-325 Mg Tabs (Hydrocodone-acetaminophen) .Marland Kitchen... 1 by mouth up to 4 times per day as needed for pain  Problem # 3:  HYPERTENSION (ICD-401.9) controlled, continue current  medications  Her updated medication list for this problem includes:    Diovan Hct 160-12.5 Mg Tabs (Valsartan-hydrochlorothiazide) ..... One by mouth every morning  BP today: 128/68 Prior BP: 134/68 (02/05/2010)  Labs Reviewed: K+: 4.4 (10/19/2009) Creat: : 1.2 (10/19/2009)   Chol: 222 (12/02/2008)   HDL: 63.8 (12/02/2008)   LDL: 147 (12/02/2008)   TG: 57 (12/02/2008)  Complete  Medication List: 1)  Aspir-81 81 Mg Tbec (Aspirin) .... Take 1 tablet by mouth once a day 2)  Potassium Chloride Crys Cr 10 Meq Tbcr (Potassium chloride crys cr) .... Take 1 tablet by mouth once a day 3)  Ferrous Fumarate 325 (106 Fe) Mg Tabs (Ferrous fumarate) .... Take 1 tablet by mouth once a day 4)  Oscal 500/200 D-3 500-200 Mg-unit Tabs (Calcium-vitamin d) .... Qd 5)  Multivitamins Tabs (Multiple vitamin) .... Qd 6)  Clorazepate Dipotassium 3.75 Mg Tabs (Clorazepate dipotassium) .... Take 1 tablet by mouth once a day as needed 7)  Diovan Hct 160-12.5 Mg Tabs (Valsartan-hydrochlorothiazide) .... One by mouth every morning 8)  Zyrtec Allergy 10 Mg Tabs (Cetirizine hcl) .... One by mouth daily 9)  Prilosec 20 Mg Cpdr (Omeprazole) .... Take 1 tablet by mouth once a day 10)  Hydrocodone-acetaminophen 5-325 Mg Tabs (Hydrocodone-acetaminophen) .Marland Kitchen.. 1 by mouth up to 4 times per day as needed for pain  Patient Instructions: 1)  Please schedule a follow-up appointment in 1 month. Prescriptions: HYDROCODONE-ACETAMINOPHEN 5-325 MG TABS (HYDROCODONE-ACETAMINOPHEN) 1 by mouth up to 4 times per day as needed for pain  #30 x 0   Entered and Authorized by:   Birdie Sons MD   Signed by:   Birdie Sons MD on 05/02/2010   Method used:   Print then Give to Patient   RxID:   [id number]

## 2010-11-20 NOTE — Progress Notes (Signed)
Summary: rx refill  Phone Note Refill Request Message from:  Fax from Pharmacy on Mar 08, 2010 2:16 PM  Refills Requested: Medication #1:  CLORAZEPATE DIPOTASSIUM 3.75 MG  TABS Take 1 tablet by mouth once a day as needed  Method Requested: Electronic Initial call taken by: Kern Reap CMA Duncan Dull),  Mar 08, 2010 2:16 PM    Prescriptions: CLORAZEPATE DIPOTASSIUM 3.75 MG  TABS (CLORAZEPATE DIPOTASSIUM) Take 1 tablet by mouth once a day as needed  #90 x 0   Entered by:   Kern Reap CMA (AAMA)   Authorized by:   Birdie Sons MD   Signed by:   Kern Reap CMA (AAMA) on 03/08/2010   Method used:   Telephoned to ...       [facility] (retail)       [address]       [place], [place]  [address]       Ph: [phone]       Fax: [phone]   RxID:   [id number]

## 2010-11-20 NOTE — Progress Notes (Signed)
Summary: refill  Phone Note Refill Request Message from:  Fax from Pharmacy on July 03, 2010 12:34 PM  Refills Requested: Medication #1:  CLORAZEPATE DIPOTASSIUM 3.75 MG  TABS Take 1 tablet by mouth once a day as needed Initial call taken by: Kern Reap CMA Duncan Dull),  July 03, 2010 12:34 PM    Prescriptions: CLORAZEPATE DIPOTASSIUM 3.75 MG  TABS (CLORAZEPATE DIPOTASSIUM) Take 1 tablet by mouth once a day as needed  #90 x 0   Entered by:   Kern Reap CMA (AAMA)   Authorized by:   Birdie Sons MD   Signed by:   Kern Reap CMA (AAMA) on 07/03/2010   Method used:   Historical   RxID:   [id number]

## 2010-11-20 NOTE — Progress Notes (Signed)
Summary: refill  Phone Note Refill Request Call back at Home Phone [phone] Message from:  Patient---voice mail  Refills Requested: Medication #1:  PRILOSEC 20 MG CPDR Take 1 tablet by mouth once a day   Brand Name Necessary? No  Medication #2:  HYDROCODONE-ACETAMINOPHEN 5-325 MG TABS 1 by mouth up to 4 times per day as needed for pain send to [facility] pharmacy. pt is out of meds.  Initial call taken by: Warnell Forester,  August 13, 2010 2:39 PM  Follow-up for Phone Call        ok to refill both  (hydrocodone.acetaminophen #30/0 refills) Follow-up by: Birdie Sons MD,  August 13, 2010 4:05 PM  Additional Follow-up for Phone Call Additional follow up Details #1::        Rx called to pharmacy Additional Follow-up by: Alfred Levins, CMA,  August 13, 2010 4:37 PM    Prescriptions: HYDROCODONE-ACETAMINOPHEN 5-325 MG TABS (HYDROCODONE-ACETAMINOPHEN) 1 by mouth up to 4 times per day as needed for pain  #30 x 0   Entered by:   Alfred Levins, CMA   Authorized by:   Birdie Sons MD   Signed by:   Alfred Levins, CMA on 08/13/2010   Method used:   Telephoned to ...       [facility] (retail)       [address]       [place], [place]  [address]       Ph: [phone]       Fax: [phone]   RxID:   [id number] PRILOSEC 20 MG CPDR (OMEPRAZOLE) Take 1 tablet by mouth once a day  #30 x 6   Entered by:   Alfred Levins, CMA   Authorized by:   Birdie Sons MD   Signed by:   Alfred Levins, CMA on 08/13/2010   Method used:   Electronically to        [facility] (retail)       [address]       [place], [place]  [address]       Ph: [phone]       Fax: [phone]   RxID:   [id number]

## 2010-11-20 NOTE — Letter (Signed)
[facility]   Imported By: Maryln Gottron 12/11/2009 14:44:55  _____________________________________________________________________  External Attachment:    Type:   Image     Comment:   External Document

## 2010-11-20 NOTE — Assessment & Plan Note (Signed)
Summary: fup//ccm   Vital Signs:  Patient profile:   77 year old female Weight:      162 pounds Temp:     98.9 degrees F oral Pulse rate:   88 / minute Pulse rhythm:   regular Resp:     12 per minute BP sitting:   148 / 72  Vitals Entered By: Lynann Beaver CMA (July 19, 2010 9:38 AM) CC: rov Is Patient Diabetic? No Pain Assessment Patient in pain? no        CC:  rov.  History of Present Illness:  Follow-Up Visit      This is an 77 year old woman who presents for Follow-up visit.  The patient denies chest pain and palpitations.  Since the last visit the patient notes no new problems or concerns.  The patient reports taking meds as prescribed.  When questioned about possible medication side effects, the patient notes none.  has some back pain---relieved with OTC meds  All other systems reviewed and were negative   Current Problems (verified): 1)  Cholelithiasis  (ICD-574.20) 2)  Back Pain  (ICD-724.5) 3)  Anemia-chronic---dr Sherrill  (ICD-280.9) 4)  Carcinoid Tumor  (ICD-239.9) 5)  Hyperglycemia  (ICD-790.6) 6)  Hypertension  (ICD-401.9) 7)  Gerd  (ICD-530.81) 8)  Allergic Rhinitis  (ICD-477.9)  Current Medications (verified): 1)  Aspir-81 81 Mg Tbec (Aspirin) .... Take 1 Tablet By Mouth Once A Day 2)  Potassium Chloride Crys Cr 10 Meq Tbcr (Potassium Chloride Crys Cr) .... Take 1 Tablet By Mouth Once A Day 3)  Ferrous Fumarate 325 (106 Fe) Mg  Tabs (Ferrous Fumarate) .... Take 1 Tablet By Mouth Once A Day 4)  Oscal 500/200 D-3 500-200 Mg-Unit  Tabs (Calcium-Vitamin D) .... Qd 5)  Multivitamins   Tabs (Multiple Vitamin) .... Qd 6)  Clorazepate Dipotassium 3.75 Mg  Tabs (Clorazepate Dipotassium) .... Take 1 Tablet By Mouth Once A Day As Needed 7)  Diovan Hct 160-12.5 Mg  Tabs (Valsartan-Hydrochlorothiazide) .... One By Mouth Every Morning 8)  Zyrtec Allergy 10 Mg Tabs (Cetirizine Hcl) .... One By Mouth Daily 9)  Prilosec 20 Mg Cpdr (Omeprazole) .... Take 1 Tablet  By Mouth Once A Day 10)  Hydrocodone-Acetaminophen 5-325 Mg Tabs (Hydrocodone-Acetaminophen) .Marland Kitchen.. 1 By Mouth Up To 4 Times Per Day As Needed For Pain  Allergies (verified): No Known Drug Allergies  Past History:  Past Medical History: Last updated: 11/27/2007 Allergic rhinitis GERD Hypertension degenerative joint disease, OA carcinoid tumor increased glucose, hx of Anemia-iron deficiency---chronic---dr sherrill  Past Surgical History: Last updated: 06/12/2007 SB0--surgery--dx of carcinoid  Social History: Last updated: 06/12/2007 Retired Married Regular exercise-no  Risk Factors: Exercise: no (06/12/2007)  Risk Factors: Smoking Status: never (06/04/2010)  Physical Exam  General:  alert and well-developed.   Head:  normocephalic and atraumatic.   Eyes:  pupils equal and pupils round.   Ears:  R ear normal and L ear normal.   Neck:  No deformities, masses, or tenderness noted. Lungs:  normal respiratory effort and no intercostal retractions.   Heart:  normal rate and regular rhythm.   Skin:  turgor normal, color normal, and no rashes.   Psych:  normally interactive and good eye contact.     Impression & Recommendations:  Problem # 1:  BACK PAIN (ICD-724.5)  Her updated medication list for this problem includes:    Aspir-81 81 Mg Tbec (Aspirin) .Marland Kitchen... Take 1 tablet by mouth once a day    Hydrocodone-acetaminophen 5-325 Mg Tabs (Hydrocodone-acetaminophen) .Marland KitchenMarland KitchenMarland KitchenMarland Kitchen  1 by mouth up to 4 times per day as needed for pain  Problem # 2:  CARCINOID TUMOR (ICD-239.9) no recurrence  Problem # 3:  ANEMIA-CHRONIC---DR SHERRILL (ICD-280.9)  Her updated medication list for this problem includes:    Ferrous Fumarate 325 (106 Fe) Mg Tabs (Ferrous fumarate) .Marland Kitchen... Take 1 tablet by mouth once a day  Problem # 4:  HYPERTENSION (ICD-401.9)  Her updated medication list for this problem includes:    Diovan Hct 160-12.5 Mg Tabs (Valsartan-hydrochlorothiazide) ..... One by mouth every  morning  BP today: 148/72 Prior BP: 152/66 (06/04/2010)  Labs Reviewed: K+: 5.0 (06/04/2010) Creat: : 1.3 (06/04/2010)   Chol: 222 (12/02/2008)   HDL: 63.8 (12/02/2008)   LDL: 147 (12/02/2008)   TG: 57 (12/02/2008)  Complete Medication List: 1)  Aspir-81 81 Mg Tbec (Aspirin) .... Take 1 tablet by mouth once a day 2)  Potassium Chloride Crys Cr 10 Meq Tbcr (Potassium chloride crys cr) .... Take 1 tablet by mouth once a day 3)  Ferrous Fumarate 325 (106 Fe) Mg Tabs (Ferrous fumarate) .... Take 1 tablet by mouth once a day 4)  Oscal 500/200 D-3 500-200 Mg-unit Tabs (Calcium-vitamin d) .... Qd 5)  Multivitamins Tabs (Multiple vitamin) .... Qd 6)  Clorazepate Dipotassium 3.75 Mg Tabs (Clorazepate dipotassium) .... Take 1 tablet by mouth once a day as needed 7)  Diovan Hct 160-12.5 Mg Tabs (Valsartan-hydrochlorothiazide) .... One by mouth every morning 8)  Zyrtec Allergy 10 Mg Tabs (Cetirizine hcl) .... One by mouth daily 9)  Prilosec 20 Mg Cpdr (Omeprazole) .... Take 1 tablet by mouth once a day 10)  Hydrocodone-acetaminophen 5-325 Mg Tabs (Hydrocodone-acetaminophen) .Marland Kitchen.. 1 by mouth up to 4 times per day as needed for pain  Other Orders: Radiology Referral (Radiology)  Patient Instructions: 1)  Please schedule a follow-up appointment in 4 months.

## 2010-11-20 NOTE — Assessment & Plan Note (Signed)
Summary: ROA/FUP/RCD   Vital Signs:  Patient profile:   77 year old female Weight:      162 pounds Temp:     98.3 degrees F oral Pulse rate:   64 / minute Pulse rhythm:   regular Resp:     12 per minute BP sitting:   152 / 66  (left arm) Cuff size:   regular  Vitals Entered By: Gladis Riffle, RN (June 04, 2010 10:56 AM) CC: 1 month rov--states insurance will not cover omeprazole unless PA done Is Patient Diabetic? No Comments continues back pain   CC:  1 month rov--states insurance will not cover omeprazole unless PA done.  History of Present Illness: no recurrent uti sxs  reviewed previous notes and Ed evlauation and inmaging studies (echart)---gall stones. no recurrent pain no jaundice noted, no n/v  htn---tolerating meds  All other systems reviewed and were negative   Preventive Screening-Counseling & Management  Alcohol-Tobacco     Smoking Status: never  Current Medications (verified): 1)  Aspir-81 81 Mg Tbec (Aspirin) .... Take 1 Tablet By Mouth Once A Day 2)  Potassium Chloride Crys Cr 10 Meq Tbcr (Potassium Chloride Crys Cr) .... Take 1 Tablet By Mouth Once A Day 3)  Ferrous Fumarate 325 (106 Fe) Mg  Tabs (Ferrous Fumarate) .... Take 1 Tablet By Mouth Once A Day 4)  Oscal 500/200 D-3 500-200 Mg-Unit  Tabs (Calcium-Vitamin D) .... Qd 5)  Multivitamins   Tabs (Multiple Vitamin) .... Qd 6)  Clorazepate Dipotassium 3.75 Mg  Tabs (Clorazepate Dipotassium) .... Take 1 Tablet By Mouth Once A Day As Needed 7)  Diovan Hct 160-12.5 Mg  Tabs (Valsartan-Hydrochlorothiazide) .... One By Mouth Every Morning 8)  Zyrtec Allergy 10 Mg Tabs (Cetirizine Hcl) .... One By Mouth Daily 9)  Prilosec 20 Mg Cpdr (Omeprazole) .... Take 1 Tablet By Mouth Once A Day 10)  Hydrocodone-Acetaminophen 5-325 Mg Tabs (Hydrocodone-Acetaminophen) .Marland Kitchen.. 1 By Mouth Up To 4 Times Per Day As Needed For Pain  Allergies (verified): No Known Drug Allergies  Physical Exam  General:  alert and  well-developed.   Head:  normocephalic and atraumatic.   Eyes:  pupils equal and pupils round.   Ears:  R ear normal and L ear normal.   Neck:  No deformities, masses, or tenderness noted. Chest Wall:  no deformities and no tenderness.   Lungs:  normal respiratory effort and no intercostal retractions.   Heart:  normal rate and regular rhythm.   Abdomen:  nondistended. Normal bowel sounds. Soft and nontender. No hepatomegaly or splenomegaly noted Msk:  No deformity or scoliosis noted of thoracic or lumbar spine.   Neurologic:  cranial nerves II-XII intact.  walks with cane   Impression & Recommendations:  Problem # 1:  CHOLELITHIASIS (ICD-574.20) no recurrent sxs- she well call if sxs recur  Problem # 2:  HYPERTENSION (ICD-401.9) not as well controlled will continue to monitor Her updated medication list for this problem includes:    Diovan Hct 160-12.5 Mg Tabs (Valsartan-hydrochlorothiazide) ..... One by mouth every morning  BP today: 152/66 Prior BP: 128/68 (05/02/2010)  Labs Reviewed: K+: 4.4 (10/19/2009) Creat: : 1.2 (10/19/2009)   Chol: 222 (12/02/2008)   HDL: 63.8 (12/02/2008)   LDL: 147 (12/02/2008)   TG: 57 (12/02/2008)  Problem # 3:  GERD (ICD-530.81) controlled continue current medications  Her updated medication list for this problem includes:    Prilosec 20 Mg Cpdr (Omeprazole) .Marland Kitchen... Take 1 tablet by mouth once a day  Complete  Medication List: 1)  Aspir-81 81 Mg Tbec (Aspirin) .... Take 1 tablet by mouth once a day 2)  Potassium Chloride Crys Cr 10 Meq Tbcr (Potassium chloride crys cr) .... Take 1 tablet by mouth once a day 3)  Ferrous Fumarate 325 (106 Fe) Mg Tabs (Ferrous fumarate) .... Take 1 tablet by mouth once a day 4)  Oscal 500/200 D-3 500-200 Mg-unit Tabs (Calcium-vitamin d) .... Qd 5)  Multivitamins Tabs (Multiple vitamin) .... Qd 6)  Clorazepate Dipotassium 3.75 Mg Tabs (Clorazepate dipotassium) .... Take 1 tablet by mouth once a day as needed 7)   Diovan Hct 160-12.5 Mg Tabs (Valsartan-hydrochlorothiazide) .... One by mouth every morning 8)  Zyrtec Allergy 10 Mg Tabs (Cetirizine hcl) .... One by mouth daily 9)  Prilosec 20 Mg Cpdr (Omeprazole) .... Take 1 tablet by mouth once a day 10)  Hydrocodone-acetaminophen 5-325 Mg Tabs (Hydrocodone-acetaminophen) .Marland Kitchen.. 1 by mouth up to 4 times per day as needed for pain  Patient Instructions: 1)  Please schedule a follow-up appointment in 4 months.  Appended Document: Orders Update     Clinical Lists Changes  Orders: Added new Service order of Venipuncture (78295) - Signed Added new Test order of TLB-Hepatic/Liver Function Pnl (80076-HEPATIC) - Signed Added new Test order of TLB-CBC Platelet - w/Differential (85025-CBCD) - Signed Added new Test order of TLB-BMP (Basic Metabolic Panel-BMET) (80048-METABOL) - Signed      Appended Document: Orders Update     Clinical Lists Changes  Orders: Added new Service order of Specimen Handling (62130) - Signed

## 2010-11-20 NOTE — Letter (Signed)
Summary: [facility]  [facility]   Imported By: Maryln Gottron 08/06/2010 10:12:54  _____________________________________________________________________  External Attachment:    Type:   Image     Comment:   External Document

## 2010-11-20 NOTE — Progress Notes (Signed)
Summary: RX request and xray results.  Phone Note Call from Patient   Caller: Patient Call For: Birdie Sons MD Summary of Call: [facility].  Want RX for pain in back and to get results of LS Spine films. [phone] Initial call taken by: Lynann Beaver CMA,  February 08, 2010 2:56 PM  Follow-up for Phone Call        Pt is calling back Friday 02/09/2010 for pain meds and xray results pf xrays. Follow-up by: Lynann Beaver CMA,  February 12, 2010 8:32 AM  Additional Follow-up for Phone Call Additional follow up Details #1::        see append to xray  Pt wants pain pills. Lynann Beaver [facility]  February 12, 2010 11:56 AM  Additional Follow-up by: Birdie Sons MD,  February 12, 2010 11:45 AM    Additional Follow-up for Phone Call Additional follow up Details #2::    called in Rx to [facility] on Saturday---they should have Rx---see meds Follow-up by: Birdie Sons MD,  February 12, 2010 11:59 AM  Additional Follow-up for Phone Call Additional follow up Details #3:: Details for Additional Follow-up Action Taken: Pt notified. Additional Follow-up by: Lynann Beaver CMA,  February 12, 2010 12:04 PM

## 2010-11-20 NOTE — Progress Notes (Signed)
Summary: cough  Phone Note Call from Patient   Caller: Patient Call For: Birdie Sons MD Summary of Call: Pt is calling to ask for RX for cough.  [facility]) [phone]  Initial call taken by: Lynann Beaver CMA,  July 20, 2010 1:56 PM  Follow-up for Phone Call        see rx Follow-up by: Birdie Sons MD,  July 20, 2010 2:11 PM  Additional Follow-up for Phone Call Additional follow up Details #1::        Rx called in    New/Updated Medications: HOMATROPINE HBR  CRYS (HOMATROPINE HYDROBROMIDE) 1 tsp two times a day as needed cough Prescriptions: HOMATROPINE HBR  CRYS (HOMATROPINE HYDROBROMIDE) 1 tsp two times a day as needed cough  #120 cc x 0   Entered and Authorized by:   Birdie Sons MD   Signed by:   Birdie Sons MD on 07/20/2010   Method used:   Telephoned to ...       [facility] (retail)       [address]       [place], [place]  [id number]       Ph: [phone]       Fax: [phone]   RxID:   [id number]  Rx called in to [facility], ot informed Sid Falcon LPN  July 20, 2010 3:22 PM

## 2010-11-20 NOTE — Medication Information (Signed)
Summary: Prior Authorization Request and Approval for Omeprazole  Prior Authorization Request and Approval for Omeprazole   Imported By: Maryln Gottron 06/18/2010 13:16:25  _____________________________________________________________________  External Attachment:    Type:   Image     Comment:   External Document

## 2010-11-20 NOTE — Assessment & Plan Note (Signed)
Summary: back pain/njr   Vital Signs:  Patient profile:   77 year old female Weight:      165 pounds Temp:     97.8 degrees F oral Pulse rate:   84 / minute Pulse rhythm:   irregular Resp:     18 per minute BP sitting:   134 / 68  (left arm) Cuff size:   regular  Vitals Entered By: Gladis Riffle, RN (February 05, 2010 8:20 AM) CC: c/o back pain that moves around, at present left lower back, Back pain Is Patient Diabetic? No   CC:  c/o back pain that moves around, at present left lower back, and Back pain.  History of Present Illness:  Back Pain      This is an 77 year old woman who presents with Back pain.  The symptoms began 1 week ago.  The intensity is described as moderate.  The patient denies fever, chills, weakness, loss of sensation, fecal incontinence, and urinary incontinence.  The pain is located in the left low back and left thoracic back.  The pain began at home and gradually.  The pain radiates to the left flank.  The pain is made worse by standing or walking, flexion, and activity.  The pain is made better by inactivity.    All other systems reviewed and were negative   Preventive Screening-Counseling & Management  Alcohol-Tobacco     Smoking Status: never  Current Problems (verified): 1)  Anemia-chronic---dr Sherrill  (ICD-280.9) 2)  Carcinoid Tumor  (ICD-239.9) 3)  Hyperglycemia  (ICD-790.6) 4)  Hypertension  (ICD-401.9) 5)  Gerd  (ICD-530.81) 6)  Allergic Rhinitis  (ICD-477.9)  Current Medications (verified): 1)  Aspir-81 81 Mg Tbec (Aspirin) .... Take 1 Tablet By Mouth Once A Day 2)  Potassium Chloride Crys Cr 10 Meq Tbcr (Potassium Chloride Crys Cr) .... Take 1 Tablet By Mouth Once A Day 3)  Ferrous Fumarate 325 (106 Fe) Mg  Tabs (Ferrous Fumarate) .... Take 1 Tablet By Mouth Once A Day 4)  Oscal 500/200 D-3 500-200 Mg-Unit  Tabs (Calcium-Vitamin D) .... Qd 5)  Multivitamins   Tabs (Multiple Vitamin) .... Qd 6)  Clorazepate Dipotassium 3.75 Mg  Tabs  (Clorazepate Dipotassium) .... Take 1 Tablet By Mouth Once A Day As Needed 7)  Diovan Hct 160-12.5 Mg  Tabs (Valsartan-Hydrochlorothiazide) .... One By Mouth Every Morning 8)  Fluticasone Propionate 50 Mcg/act  Susp (Fluticasone Propionate) .... 2 Sprays Each Nostril Once Daily As Needed 9)  Zyrtec Allergy 10 Mg Tabs (Cetirizine Hcl) .... One By Mouth Daily 10)  Prilosec 20 Mg Cpdr (Omeprazole) .... Take 1 Tablet By Mouth Once A Day  Allergies (verified): No Known Drug Allergies  Past History:  Past Medical History: Last updated: 11/27/2007 Allergic rhinitis GERD Hypertension degenerative joint disease, OA carcinoid tumor increased glucose, hx of Anemia-iron deficiency---chronic---dr sherrill  Past Surgical History: Last updated: 06/12/2007 SB0--surgery--dx of carcinoid  Social History: Last updated: 06/12/2007 Retired Married Regular exercise-no  Risk Factors: Exercise: no (06/12/2007)  Risk Factors: Smoking Status: never (02/05/2010)  Physical Exam  General:  alert and well-developed.   Head:  normocephalic and atraumatic.   Eyes:  pupils equal and pupils round.   Neck:  No deformities, masses, or tenderness noted. Chest Wall:  no deformities and no tenderness.   Lungs:  normal respiratory effort and no intercostal retractions.   Heart:  normal rate and regular rhythm.   Abdomen:  nondistended. Normal bowel sounds. Soft and nontender. No hepatomegaly or splenomegaly  noted Msk:  tenderness mid thoracic to lumbar area walking with cane Pulses:  R radial normal and L radial normal.   Neurologic:  cranial nerves II-XII intact.  walking with cane Skin:  turgor normal and color normal.     Impression & Recommendations:  Problem # 1:  BACK PAIN (ICD-724.5)  new onset sxs in elderly woman check xray R/O compression fracture.  Her updated medication list for this problem includes:    Aspir-81 81 Mg Tbec (Aspirin) .Marland Kitchen... Take 1 tablet by mouth once a  day  discussed with patient and niece  Orders: T-Lumbar Spine 2 Views (72100TC) T-Thoracic Spine 2 Views [phone])  Complete Medication List: 1)  Aspir-81 81 Mg Tbec (Aspirin) .... Take 1 tablet by mouth once a day 2)  Potassium Chloride Crys Cr 10 Meq Tbcr (Potassium chloride crys cr) .... Take 1 tablet by mouth once a day 3)  Ferrous Fumarate 325 (106 Fe) Mg Tabs (Ferrous fumarate) .... Take 1 tablet by mouth once a day 4)  Oscal 500/200 D-3 500-200 Mg-unit Tabs (Calcium-vitamin d) .... Qd 5)  Multivitamins Tabs (Multiple vitamin) .... Qd 6)  Clorazepate Dipotassium 3.75 Mg Tabs (Clorazepate dipotassium) .... Take 1 tablet by mouth once a day as needed 7)  Diovan Hct 160-12.5 Mg Tabs (Valsartan-hydrochlorothiazide) .... One by mouth every morning 8)  Fluticasone Propionate 50 Mcg/act Susp (Fluticasone propionate) .... 2 sprays each nostril once daily as needed 9)  Zyrtec Allergy 10 Mg Tabs (Cetirizine hcl) .... One by mouth daily 10)  Prilosec 20 Mg Cpdr (Omeprazole) .... Take 1 tablet by mouth once a day

## 2010-11-22 NOTE — Assessment & Plan Note (Signed)
Summary: 49mo ROV/cb   Vital Signs:  Patient profile:   77 year old female Weight:      160 pounds Temp:     98.2 degrees F oral Pulse rate:   76 / minute Pulse rhythm:   regular BP sitting:   150 / 80  (left arm) Cuff size:   regular  Vitals Entered By: Alfred Levins, CMA (October 05, 2010 10:55 AM) CC: f/u   CC:  f/u.  History of Present Illness:  Follow-Up Visit      This is an 77 year old woman who presents for Follow-up visit.  The patient denies chest pain and palpitations.  Since the last visit the patient notes no new problems or concerns except for persistent back pain---pain is constant.  The patient reports taking meds as prescribed.  When questioned about possible medication side effects, the patient notes none.    All other systems reviewed and were negative   Current Medications (verified): 1)  Aspir-81 81 Mg Tbec (Aspirin) .... Take 1 Tablet By Mouth Once A Day 2)  Potassium Chloride Crys Cr 10 Meq Tbcr (Potassium Chloride Crys Cr) .... Take 1 Tablet By Mouth Once A Day 3)  Ferrous Fumarate 325 (106 Fe) Mg  Tabs (Ferrous Fumarate) .... Take 1 Tablet By Mouth Once A Day 4)  Oscal 500/200 D-3 500-200 Mg-Unit  Tabs (Calcium-Vitamin D) .... Qd 5)  Multivitamins   Tabs (Multiple Vitamin) .... Qd 6)  Clorazepate Dipotassium 3.75 Mg  Tabs (Clorazepate Dipotassium) .... Take 1 Tablet By Mouth Once A Day As Needed 7)  Diovan Hct 160-12.5 Mg  Tabs (Valsartan-Hydrochlorothiazide) .... One By Mouth Every Morning 8)  Zyrtec Allergy 10 Mg Tabs (Cetirizine Hcl) .... One By Mouth Daily 9)  Prilosec 20 Mg Cpdr (Omeprazole) .... Take 1 Tablet By Mouth Once A Day 10)  Hydrocodone-Acetaminophen 5-325 Mg Tabs (Hydrocodone-Acetaminophen) .Marland Kitchen.. 1 By Mouth Up To 4 Times Per Day As Needed For Pain 11)  Homatropine Hbr  Crys (Homatropine Hydrobromide) .Marland Kitchen.. 1 Tsp Two Times A Day As Needed Cough  Allergies (verified): No Known Drug Allergies  Past History:  Past Medical  History: Last updated: 11/27/2007 Allergic rhinitis GERD Hypertension degenerative joint disease, OA carcinoid tumor increased glucose, hx of Anemia-iron deficiency---chronic---dr sherrill  Past Surgical History: Last updated: 06/12/2007 SB0--surgery--dx of carcinoid  Social History: Last updated: 06/12/2007 Retired Married Regular exercise-no  Risk Factors: Exercise: no (06/12/2007)  Risk Factors: Smoking Status: never (06/04/2010)  Physical Exam  General:  elderly female no acute distress. HEENT exam atraumatic, normocephalic symmetric her muscles are intact. Neck is supple without jugular venous distention. Chest clear to auscultation cardiac exam S1-S2 are regular. Abdominal exam active bowel sounds, soft  extremities: no edema   Impression & Recommendations:  Problem # 1:  BACK PAIN (ICD-724.5) discussed the possibility of ESI Her updated medication list for this problem includes:    Aspir-81 81 Mg Tbec (Aspirin) .Marland Kitchen... Take 1 tablet by mouth once a day    Hydrocodone-acetaminophen 5-325 Mg Tabs (Hydrocodone-acetaminophen) .Marland Kitchen... 1 by mouth up to 4 times per day as needed for pain  Problem # 2:  CARCINOID TUMOR (ICD-239.9) no recurrence  Problem # 3:  HYPERTENSION (ICD-401.9) repeat bp 138/88 continue current medications  Her updated medication list for this problem includes:    Diovan Hct 160-12.5 Mg Tabs (Valsartan-hydrochlorothiazide) ..... One by mouth every morning  BP today: 150/80 Prior BP: 148/72 (07/19/2010)  Labs Reviewed: K+: 5.0 (06/04/2010) Creat: : 1.3 (06/04/2010)  Chol: 222 (12/02/2008)   HDL: 63.8 (12/02/2008)   LDL: 147 (12/02/2008)   TG: 57 (12/02/2008)  Problem # 4:  HYPERGLYCEMIA (ICD-790.6)  Complete Medication List: 1)  Aspir-81 81 Mg Tbec (Aspirin) .... Take 1 tablet by mouth once a day 2)  Potassium Chloride Crys Cr 10 Meq Tbcr (Potassium chloride crys cr) .... Take 1 tablet by mouth once a day 3)  Ferrous Fumarate 325 (106 Fe)  Mg Tabs (Ferrous fumarate) .... Take 1 tablet by mouth once a day 4)  Oscal 500/200 D-3 500-200 Mg-unit Tabs (Calcium-vitamin d) .... Qd 5)  Multivitamins Tabs (Multiple vitamin) .... Qd 6)  Clorazepate Dipotassium 3.75 Mg Tabs (Clorazepate dipotassium) .... Take 1 tablet by mouth once a day as needed 7)  Diovan Hct 160-12.5 Mg Tabs (Valsartan-hydrochlorothiazide) .... One by mouth every morning 8)  Zyrtec Allergy 10 Mg Tabs (Cetirizine hcl) .... One by mouth daily 9)  Prilosec 20 Mg Cpdr (Omeprazole) .... Take 1 tablet by mouth once a day 10)  Hydrocodone-acetaminophen 5-325 Mg Tabs (Hydrocodone-acetaminophen) .Marland Kitchen.. 1 by mouth up to 4 times per day as needed for pain 11)  Homatropine Hbr Crys (Homatropine hydrobromide) .Marland Kitchen.. 1 tsp two times a day as needed cough  Patient Instructions: 1)  Please schedule a follow-up appointment in 4 months. Prescriptions: HYDROCODONE-ACETAMINOPHEN 5-325 MG TABS (HYDROCODONE-ACETAMINOPHEN) 1 by mouth up to 4 times per day as needed for pain  #30 x 0   Entered and Authorized by:   Birdie Sons MD   Signed by:   Birdie Sons MD on 10/05/2010   Method used:   Print then Give to Patient   RxID:   [id number]    Orders Added: 1)  Est. Patient Level IV [14782]

## 2010-11-26 ENCOUNTER — Encounter: Payer: Self-pay | Admitting: *Deleted

## 2010-11-26 NOTE — Telephone Encounter (Signed)
Encounter opened in error

## 2010-11-27 ENCOUNTER — Ambulatory Visit: Payer: Self-pay | Admitting: Internal Medicine

## 2010-11-27 ENCOUNTER — Other Ambulatory Visit: Payer: Self-pay | Admitting: *Deleted

## 2010-11-27 MED ORDER — CLORAZEPATE DIPOTASSIUM 3.75 MG PO TABS: 3.7500 mg | ORAL_TABLET | Freq: Every day | ORAL | Status: AC | PRN

## 2010-11-29 ENCOUNTER — Ambulatory Visit (INDEPENDENT_AMBULATORY_CARE_PROVIDER_SITE_OTHER): Payer: Medicare Other | Admitting: Internal Medicine

## 2010-11-29 ENCOUNTER — Encounter: Payer: Self-pay | Admitting: Internal Medicine

## 2010-11-29 DIAGNOSIS — I10 Essential (primary) hypertension: Secondary | ICD-10-CM

## 2010-11-29 DIAGNOSIS — D499 Neoplasm of unspecified behavior of unspecified site: Secondary | ICD-10-CM

## 2010-11-29 DIAGNOSIS — M549 Dorsalgia, unspecified: Secondary | ICD-10-CM

## 2010-11-29 DIAGNOSIS — R7989 Other specified abnormal findings of blood chemistry: Secondary | ICD-10-CM

## 2010-11-29 LAB — CBC WITH DIFFERENTIAL/PLATELET
Basophils Relative: 0.5 % (ref 0.0–3.0)
Eosinophils Absolute: 0.1 10*3/uL (ref 0.0–0.7)
Eosinophils Relative: 1.8 % (ref 0.0–5.0)
HCT: 34.5 % — ABNORMAL LOW (ref 36.0–46.0)
Hemoglobin: 11.8 g/dL — ABNORMAL LOW (ref 12.0–15.0)
Lymphocytes Relative: 31.1 % (ref 12.0–46.0)
MCV: 94.8 fl (ref 78.0–100.0)
Monocytes Absolute: 0.6 10*3/uL (ref 0.1–1.0)
Monocytes Relative: 12.5 % — ABNORMAL HIGH (ref 3.0–12.0)
Neutro Abs: 2.4 10*3/uL (ref 1.4–7.7)
Neutrophils Relative %: 54.1 % (ref 43.0–77.0)
Platelets: 434 10*3/uL — ABNORMAL HIGH (ref 150.0–400.0)
RBC: 3.64 Mil/uL — ABNORMAL LOW (ref 3.87–5.11)
RDW: 15.4 % — ABNORMAL HIGH (ref 11.5–14.6)
WBC: 4.4 10*3/uL — ABNORMAL LOW (ref 4.5–10.5)

## 2010-11-29 LAB — HEPATIC FUNCTION PANEL
ALT: 18 U/L (ref 0–35)
AST: 20 U/L (ref 0–37)
Albumin: 3.8 g/dL (ref 3.5–5.2)
Alkaline Phosphatase: 105 U/L (ref 39–117)
Total Protein: 7 g/dL (ref 6.0–8.3)

## 2010-11-29 LAB — BASIC METABOLIC PANEL
BUN: 15 mg/dL (ref 6–23)
CO2: 29 mEq/L (ref 19–32)
Chloride: 94 mEq/L — ABNORMAL LOW (ref 96–112)
Creatinine, Ser: 1.2 mg/dL (ref 0.4–1.2)
Potassium: 5.1 mEq/L (ref 3.5–5.1)
Sodium: 131 mEq/L — ABNORMAL LOW (ref 135–145)

## 2010-11-29 NOTE — Progress Notes (Signed)
HPI:  Larey Seat 3 days ago : 130 in the morning---she thinks she rolled out of bed. No LOC but she hit left side of face---some swelling around right eye. She also has some associated back pain and bruising on knees. She has no residual neurolgic sxs. Her Gait is normal.   patient denies chest pain, shortness of breath, orthopnea. Denies lower extremity edema, abdominal pain, change in appetite, change in bowel movements. Patient denies rashes, musculoskeletal complaints. No other specific complaints in a complete review of systems.    EXAM:   well-developed well-nourished female in no acute distress. HEENT exam bruising around right eye, normocephalic, neck supple without jugular venous distention. Chest clear to auscultation cardiac exam S1-S2 are regular. Abdominal exam overweight with bowel sounds, soft and nontender. Extremities no edema. Neurologic exam is alert with a normal gait.    Assessment and plan. Patient has had a recent fall. She's had minor trauma to the face. She's not had any residual neurologic symptoms. She has had multiple medical problems. I think I will get some laboratory work from her related to her other medical problems. She may need further evaluation if the lab work is done.

## 2011-01-06 LAB — DIFFERENTIAL
Basophils Absolute: 0 10*3/uL (ref 0.0–0.1)
Basophils Relative: 0 % (ref 0–1)
Eosinophils Absolute: 0.1 10*3/uL (ref 0.0–0.7)
Eosinophils Relative: 2 % (ref 0–5)
Lymphs Abs: 1.1 10*3/uL (ref 0.7–4.0)

## 2011-01-06 LAB — CBC
HCT: 33.3 % — ABNORMAL LOW (ref 36.0–46.0)
MCH: 33.7 pg (ref 26.0–34.0)
MCV: 95.6 fL (ref 78.0–100.0)
Platelets: 382 10*3/uL (ref 150–400)
RDW: 13.5 % (ref 11.5–15.5)
WBC: 4.7 10*3/uL (ref 4.0–10.5)

## 2011-01-06 LAB — URINALYSIS, ROUTINE W REFLEX MICROSCOPIC
Bilirubin Urine: NEGATIVE
Nitrite: NEGATIVE
Specific Gravity, Urine: 1.007 (ref 1.005–1.030)
Urobilinogen, UA: 0.2 mg/dL (ref 0.0–1.0)
pH: 5.5 (ref 5.0–8.0)

## 2011-01-06 LAB — COMPREHENSIVE METABOLIC PANEL
Albumin: 3.8 g/dL (ref 3.5–5.2)
Alkaline Phosphatase: 116 U/L (ref 39–117)
BUN: 15 mg/dL (ref 6–23)
Chloride: 101 mEq/L (ref 96–112)
Creatinine, Ser: 1.25 mg/dL — ABNORMAL HIGH (ref 0.4–1.2)
Glucose, Bld: 120 mg/dL — ABNORMAL HIGH (ref 70–99)
Potassium: 3.7 mEq/L (ref 3.5–5.1)
Total Bilirubin: 0.6 mg/dL (ref 0.3–1.2)

## 2011-01-06 LAB — LIPASE, BLOOD: Lipase: 47 U/L (ref 11–59)

## 2011-01-06 LAB — URINE MICROSCOPIC-ADD ON

## 2011-01-29 ENCOUNTER — Ambulatory Visit (INDEPENDENT_AMBULATORY_CARE_PROVIDER_SITE_OTHER): Payer: Medicare Other | Admitting: Internal Medicine

## 2011-01-29 DIAGNOSIS — K219 Gastro-esophageal reflux disease without esophagitis: Secondary | ICD-10-CM

## 2011-01-29 DIAGNOSIS — I10 Essential (primary) hypertension: Secondary | ICD-10-CM

## 2011-01-29 DIAGNOSIS — M549 Dorsalgia, unspecified: Secondary | ICD-10-CM

## 2011-01-29 DIAGNOSIS — D499 Neoplasm of unspecified behavior of unspecified site: Secondary | ICD-10-CM

## 2011-01-29 MED ORDER — HYDROCODONE-ACETAMINOPHEN 5-325 MG PO TABS: 1.0000 | ORAL_TABLET | Freq: Four times a day (QID) | ORAL | Status: DC | PRN

## 2011-01-29 NOTE — Assessment & Plan Note (Signed)
Chronic problem Interferes with ADLs from a pain point of view She will likely benefit from steroid injection---will ask interventional radiology to review

## 2011-01-29 NOTE — Assessment & Plan Note (Signed)
Controlled Continue same meds 

## 2011-01-29 NOTE — Progress Notes (Signed)
  Subjective:    Patient ID: Becky Morales, female    DOB: Jul 04, 1920, 77 y.o.   MRN: [id number]  HPI  Back pain---chronic problem---reviewed MRI with patient and niece. No radiation of pain htn---tolerating meds gerd---no sxs on ppi Carcinoid tumor---no recurence  Past Medical History  Diagnosis Date  . CARCINOID TUMOR 04/13/2007  . HYPERTENSION 04/13/2007  . ALLERGIC RHINITIS 04/13/2007  . GERD 04/13/2007  . HYPERGLYCEMIA 04/13/2007  . CHOLELITHIASIS 05/02/2010   Past Surgical History  Procedure Date  . Sbo surgery     reports that she has never smoked. She does not have any smokeless tobacco history on file. She reports that she does not drink alcohol or use illicit drugs. family history includes Heart attack in her brother; Hyperlipidemia in her brother; Hypertension in her brother, father, and mother; and Uterine cancer in her mother. No Known Allergies  Current Outpatient Prescriptions  Medication Sig Dispense Refill  . aspirin 81 MG tablet Take 81 mg by mouth daily.        . calcium-vitamin D (OSCAL WITH D 500-200) 500-200 MG-UNIT per tablet Take 1 tablet by mouth daily.        . cetirizine (ZYRTEC) 10 MG tablet Take 10 mg by mouth daily.        Marland Kitchen DIOVAN HCT 160-12.5 MG per tablet       . ferrous sulfate 325 (65 FE) MG tablet Take 325 mg by mouth daily with breakfast.        . HYDROcodone-acetaminophen (NORCO) 5-325 MG per tablet Take 1 tablet by mouth every 6 (six) hours as needed.       . multivitamin (THERAGRAN) per tablet Take 1 tablet by mouth daily.        Marland Kitchen omeprazole (PRILOSEC) 20 MG capsule Take 20 mg by mouth daily.       . potassium chloride SA (K-DUR,KLOR-CON) 10 MEQ tablet Take 10 mEq by mouth daily.        Review of Systems  patient denies chest pain, shortness of breath, orthopnea. Denies lower extremity edema, abdominal pain, change in appetite, change in bowel movements. Patient denies rashes, musculoskeletal complaints (except back pain). No other specific  complaints in a complete review of systems.      Objective:   Physical Exam  Well-developed well-nourished female in no acute distress. HEENT exam atraumatic, normocephalic, extraocular muscles are intact. Neck is supple. No jugular venous distention no thyromegaly. Chest clear to auscultation without increased work of breathing. Cardiac exam S1 and S2 are regular. Abdominal exam active bowel sounds, soft, nontender. Extremities no edema. Neurologic exam she is alert without any motor sensory deficits. Gait is broad based.        Assessment & Plan:

## 2011-01-29 NOTE — Assessment & Plan Note (Signed)
Well controlled Continue meds 

## 2011-01-30 LAB — COMPREHENSIVE METABOLIC PANEL
ALT: 17 U/L (ref 0–35)
Calcium: 8.8 mg/dL (ref 8.4–10.5)
Glucose, Bld: 134 mg/dL — ABNORMAL HIGH (ref 70–99)
Sodium: 131 mEq/L — ABNORMAL LOW (ref 135–145)
Total Protein: 6.6 g/dL (ref 6.0–8.3)

## 2011-01-30 LAB — POCT CARDIAC MARKERS
CKMB, poc: <1 ng/mL — ABNORMAL LOW (ref 1.0–8.0)
Myoglobin, poc: 109 ng/mL (ref 12–200)
Troponin i, poc: <0.05 ng/mL (ref 0.00–0.09)

## 2011-01-30 LAB — CBC
Hemoglobin: 11.1 g/dL — ABNORMAL LOW (ref 12.0–15.0)
MCHC: 34.8 g/dL (ref 30.0–36.0)
RDW: 14.4 % (ref 11.5–15.5)

## 2011-01-30 LAB — DIFFERENTIAL
Lymphs Abs: 0.5 10*3/uL — ABNORMAL LOW (ref 0.7–4.0)
Monocytes Relative: 10 % (ref 3–12)
Neutro Abs: 6.1 10*3/uL (ref 1.7–7.7)
Neutrophils Relative %: 83 % — ABNORMAL HIGH (ref 43–77)

## 2011-02-01 ENCOUNTER — Encounter: Payer: Self-pay | Admitting: Internal Medicine

## 2011-03-05 NOTE — Assessment & Plan Note (Signed)
[facility]                                 ON-CALL NOTE   Becky Morales, Becky Morales                         MRN:          [id number]  DATE:04/11/2007                            DOB:          July 14, 1920    Patient of Dr. Cato Mulligan.  Called from [phone].  States she is being  treated for an infection with Avelox and promethazine with codeine.  Is  having a lot of nausea and vomiting with the codeine.  The daughter  actually called to relay this information.  Recommend stopping the  promethazine with codeine and getting some Mucinex over-the-counter, and  to make sure she took the Avelox with food and to call back if symptoms  do not improve.     Lelon Perla, DO  Electronically Signed    Shawnie Dapper  DD: 04/11/2007  DT: 04/11/2007  Job #: [id number]   cc:   Valetta Mole. Swords, MD

## 2011-03-08 NOTE — H&P (Signed)
[facility]  Patient:    Becky Morales, Becky Morales                            MRN: [id number] Adm. Date:  11/01/00 Attending:  Corwin Levins, M.D. [facility] CC:         Valetta Mole. Swords, M.D. [facility]   History and Physical  CHIEF COMPLAINT:  Abdominal pain, nausea, vomiting since this morning.  HISTORY OF PRESENT ILLNESS:  Becky Morales is a 77 year old black female with acute onset symptoms as above on waking this morning without fever.  The pain is more or less in the upper abdomen, but diffuse and thinks she may be constipated.  She had multiple episodes of vomiting prior to coming to the ER. After six hours in the emergency room she has had no vomiting at all.  She may be somewhat distended.  There is no fever.  No prior history of GI problems. No diarrhea.  PAST MEDICAL HISTORY: 1. Hypertension. 2. History of DJD T spine. 3. Anxiety. 4. Infrequent UTIs one to two times per year. 5. Urine incontinence.  PAST SURGICAL HISTORY:  None.  ALLERGIES:  No known drug allergies.  CURRENT MEDICATIONS: 1. Lasix 40 mg q.o.d. and 80 mg q.o.d. 2. Norvasc 5 mg p.o. q.d. 3. Ecotrin 81 mg p.o. q.d. 4. Tranxene 7.5 mg q.h.s. 5. KCL 10 mEq p.o. q.d. 6. Vioxx 12.5 mg p.r.n.  Samples given recently for a total of two weeks    trial.  SOCIAL HISTORY:  No tobacco.  Alcohol:  Occasional cocktail.  Widowed.  No children.  Retired Dance movement psychotherapist.  FAMILY HISTORY:  Negative for hypertension and throat cancer.  REVIEW OF SYSTEMS:  She has had two days of URI and coldlike symptoms with productive cough, possibly low grade temperature.  PHYSICAL EXAMINATION:  VITAL SIGNS:  Afebrile, respirations 20, heart rate 124, lying down blood pressure 145/48, standing up 114/44.  GENERAL:  Alert and oriented x 3.  Mildly ill-appearing.  HEENT:  Sclerae:  Clear.  TMs:  Clear.  Pharynx:  Mildly erythematous.  NECK:  Without JVD, bruit.  CHEST:  No rales or wheezing.  CARDIAC:  Tachy, regular  rate and rhythm.  No murmur.  ABDOMEN:  Question mild distended.  Somewhat decreased bowel sounds.  Does not appear hyperactive.  Diffusely mild upper abdominal tenderness and no organomegaly.  EXTREMITIES:  No edema.  NEUROLOGIC:  Alert and oriented x 3, otherwise nonfocal.  LABORATORIES:  Lipase 46.  Urinalysis normal.  White blood cell count 11.8, hemoglobin 13.7.  Glucose 175.  Electrolytes within normal limits.  BUN, creatinine 16/1.1.  SGOT 52, SGPT 74, alkaline phosphatase 134, LFTs otherwise within normal limits.  Acute abdominal series suggestive of a small bowel obstruction.  Chest x-ray with cardiac enlargement, no CHF.  Abdominal ultrasound:  Dilated bowel loops, otherwise negative.  ASSESSMENT AND PLAN: 1. Abdominal pain with nausea and vomiting, but none for the last six hours.    Differential includes small bowel obstruction suggestive per films versus    viral gastroenteritis.  She is to be admitted.  Consider NG for recurrent    frequent vomiting.  Will try antiemetics p.r.n., intravenous fluids.  Need    to follow-up laboratories and abdomen series in the a.m.  Need to consider    surgery and gastrointestinal consult.  n.p.o. except ice chips tonight. 2. Dehydration/orthostasis.  Given intravenous fluids. 3. Hyperglycemia likely secondary to stress/illness.  Will check  CBGs as well    as hemoglobin A1C. 4. Mild increased LFTs.  Ultrasound negative.  Likely secondary to vomiting    versus Vioxx.  Will check chronic hepatitis panel, coags.  Hold the Vioxx. 5. Cardiac enlargement by chest x-ray.  Will check 12 lead EKG.  Consider 2-D    echocardiogram. 6. Hypertension.  Simply hold p.o. medicines for tonight. DD:  11/01/00 TD:  11/01/00 Job: [id number]

## 2011-03-08 NOTE — Op Note (Signed)
[facility]  Patient:    Becky Morales, Becky Morales                          MRN: [id number] Proc. Date: 11/02/00 Adm. Date:  [id number] Attending:  Corwin Levins CC:         Corwin Levins, M.D. [facility]  Bruce H. Swords, M.D. [facility]   Operative Report  PREOPERATIVE DIAGNOSIS:  Intestinal obstruction.  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  PROCEDURES: 1. Exploratory laparotomy. 2. Right colectomy. 3. Resection of distal ileum.  SURGEON:  Adolph Pollack, M.D.  ASSISTANT:  None.  ANESTHESIA:  General.  INDICATIONS:  This 77 year old female was admitted on November 01, 2000, with signs, symptoms, and a radiographic picture consistent with intestinal obstruction.  Her condition worsened today in all regards.  Now she is brought to the operating room.  She has not had previous abdominal surgery.  FINDINGS:  There is an obstructing neoplasm at the ileocecal valve radiating to the small bowel obstruction.  I did not see or feel any liver lesions or see any adenopathy or see any pelvic lesions.  TECHNIQUE:  She was placed supine on the operating table and general anesthetic was administered.  Her abdomen was sterilely prepped and draped.  A long midline incision was made, incising the skin sharply.  The subcutaneous tissues and midline fascia were incised with the cautery.  The peritoneum was incised with the cautery and entered.  Upon entering the peritoneum, dilated small intestine was noted.  I began at the ligament of Treitz and proceeded distally.  When I came to the ileocecal valve, I noted that the intestine was dilated all the way to there.  There were some adhesions between the right fallopian tube and the distal ileum, which I lysed sharply.  I then mobilized part of the right colon and could feel the mass in the distal ileum at the ileocecal valve that was obstructive.  With some force I was able to  force the enteric contents into the cecum.  This mass was fairly firm.  I decided to perform a right colectomy and distal ileal resection at this time because of suspicion of malignant neoplasm.  The ascending colon was mobilized by medializing it and incising its bilateral attachments.  The proximal half of the transverse colon was isolated away from the omentum.  About 15 cm proximal to the ileocecal valve, the ileum was divided.  The transverse colon was divided in its proximal one third area. The mesentery was then divided between [facility] clamps and the vessels ligated. The specimen was handed off of the field and sent to pathology.  Next a side-to-side staple anastomosis was performed.  The enterotomy was closed with a linear stapler.  The anastomosis was patent, viable, and under no tension. The mesenteric defect created by the bowel resection was closed with interrupted sutures.  The abdominal cavity was the copiously irrigated with normal saline. Hemostasis was adequate.  The fascia was closed with a running 1-0 PDS suture. The subcutaneous tissues were irrigated.  The skin was closed with staples.  A sterile dressing was applied.  She tolerated the procedure fairly well without any apparent complications and was taken to the recovery room in satisfactory condition. DD:  11/02/00 TD:  11/02/00 Job: [id number]

## 2011-03-08 NOTE — Discharge Summary (Signed)
[facility]  Patient:    Becky Morales, Becky Morales Visit Number: [id number] MRN: [id number]          Service Type: MED Location: 3W [id number] Attending Physician:  Judie Petit Dictated by:   Sonda Primes, M.D. [facility] Admit Date:  04/09/2002 Discharge Date: 04/11/2002   CC:         Bruce H. Swords, M.D.   Discharge Summary  DATE OF BIRTH:  02/27/1920  DISCHARGE DIAGNOSES: 1. Chest pain, non-cardiac.  Cardiolite stress test was negative. 2. Myocardial infarction ruled out. 3. Probable indigestion/gastroesophageal reflux disease. 4. Hypertension. 5. Anxiety.  DISCHARGE MEDICATIONS: 1. Protonix 40 mg q.d. 2. Resume other home medications.  ACTIVITY:  As tolerated.  DIET:  Resume previous.  DISCHARGE INSTRUCTIONS:  Call if problems.  FOLLOWUP:  Dr. Cato Mulligan in 7 to 14 days.  HISTORY OF PRESENT ILLNESS:  The patient is an 77 year old female who presents with chest pain.  For the details, please address to Dr. Cato Mulligan history and physical on 04/09/02.  HOSPITAL COURSE:  During the course of hospitalization, she remained chest pain free.  She was put on Lovenox.  She underwent Cardiolite stress test on 04/10/02.  The results came back negative.  She was given Protonix for previous complaints of indigestion.  On the day of discharge, she is feeling fine, there is no chest pain.  Heart rate is 80, respiratory rate 22, blood pressure 140/49, O2 saturation 98% on room air.  She looks well.  HEENT is with moist mucosa.  Neck is supple, no thyromegaly or bruit.  Lungs are clear to auscultation and percussion.  Heart is with S1 and S2, no gallop.  Abdomen is soft, nontender.  Lower extremities are without edema.  She is alert, oriented, and cooperative.  LABORATORY DATA:  Ferratin 17.  White blood cell count 5.4, hemoglobin 11.4, platelets 291.  INR 1.0.  Potassium 3.7, glucose 140, creatinine 0.9.  AST, ALT, and alkaline phosphatase normal.  CK x3 normal.  Total  cholesterol 229, LDL 146, HDL 67.  Iron 46.  Normal B12 at 364.  Folate 18.8, normal.  Chest x-ray without acute disease.  EKG with normal sinus rhythm. Dictated by:   Sonda Primes, M.D. [facility] Attending Physician:  Judie Petit DD:  04/11/02 TD:  04/12/02 Job: [id number]

## 2011-03-08 NOTE — Consult Note (Signed)
[facility]  Patient:    Becky Morales, Becky Morales                            MRN: [id number] Proc. Date: 11/06/00 Attending:  Marnee Guarneri, M.D.                          Consultation Report  PATIENT ROOM:  405.  REASON FOR CONSULT:  Malignant carcinoid of a small bowel.  REFERRING PHYSICIAN:  Adolph Pollack, M.D.  HISTORY OF PRESENT ILLNESS:  Becky Morales is a 77 year old female who presented to the emergency room at [facility] on November 01, 2000 with a complaint of one-day history of abdominal pain and persistent nausea/vomiting. Abdominal films were ordered and consistent with a small bowel obstruction. She underwent exploratory laparotomy November 02, 2000 by Dr. Abbey Chatters with findings consistent with an obstructive neoplasm at the ileocecal valve. A right colectomy and distal ileal resection were done with mass sent for pathology. Final pathology report is not available, however, preliminary report shows findings consistent with carcinoid of the terminal ileum with two positive ______ lymph nodes.  PAST HISTORY SIGNIFICANT FOR: 1. Hypertension. 2. Degenerative joint disease of the spine. 3. Urinary incontinence. 4. Anxiety. 5. Recurrent UTIs. 6. Status post tonsillectomy.  MEDICATIONS:  Aspirin, Cefotan, Tranxene, Pepcid.  ALLERGIES:  No known drug allergies.  FAMILY HISTORY:   Her mother died of uterine cancer. Father died of an MI. Various cancers in the family: brothers with prostate cancer and lung cancer.  SOCIAL HISTORY:   She lives in [place] by herself. She is widowed. She has no children. She is retired from housekeeping an Arts administrator since 1985. She has no tobacco and she has an occasional alcohol use.  REVIEW OF SYSTEMS:  No weight loss. No fever, chills, night sweats. No flushing. No anorexia. She does have a chronic headache. No vision changes. No shortness of breath. No chest pain. No diarrhea. No bowel or bladder changes. No  focal neurologic complaints. No difficulty ambulating.  PHYSICAL EXAMINATION:  VITAL SIGNS:  Temperature is 99.2. Heart rate is 109. Respirations 16. blood pressure 155/69.  GENERAL:  She appears to be younger than her stated age of 70.  HEENT:  Pupils equal, round, react to light and accommodation. Extraocular muscles intact. Sclera nonicteric.  CHEST:  Clear.  HEART:  Regular rate and rhythm.  ABDOMEN:  Soft, nontender to palpation. No hepatosplenomegaly by palpation.  EXTREMITIES:  Free range of motion. No edema.  LYMPH NODES:  No cervical, supraclavicular, or axillary adenopathy.  NEURO:  Alert and oriented x 3. Follows commands. Cranial nerves 2-12 grossly intact. Motor and sensation are intact globally.  IMPRESSION:  A 77 year old with new diagnosis carcinoid of the ileum with metastases to the nodes recovered from surgery. She has thus far refused a CAT scan in terms of assessing any other evidence of metastatic disease to the liver. At this point, she does not seem to have any symptomatic signs of typical carcinoid, and at this point I would allow her to recover and follow up with her as an outpatient two weeks after discharge. DD:  11/06/00 TD:  11/06/00 Job: [id number]

## 2011-03-08 NOTE — Assessment & Plan Note (Signed)
[facility]                           GASTROENTEROLOGY OFFICE NOTE   Becky Morales, Becky Morales                          MRN:          [id number]  DATE:07/15/2006                            DOB:          [birth year]    Dr. Cato Mulligan asked me to evaluate Becky Morales in consultation regarding a  history of colon cancer and anemia.   HISTORY OF PRESENT ILLNESS:  Becky Morales is a very pleasant 77 year old woman  who underwent a terminal ileal resection and right hemicolectomy five years  ago by Dr. Avel Peace.  She had presented with acute bowel obstruction,  underwent emergency surgery.  It turned out that she had a 2.2 cm terminal  ileal carcinoid tumor.  This was resected, several lymph nodes were removed,  as well, and there was metastases in two of the lymph nodes as well as  extension of the tumor into the mesenteric adipost tissue.  She has not had  gastrointestinal follow up since then.  She has not had any bleeding.  She  has had no recurrent obstructions.  She has had one episode of loose stools  she thinks was medication related and it resolved on its own.  She had no  constipation.  She has, however, lost 10-20 pounds in the past three months.  She recently underwent blood tests and imaging studies.  Blood test showed a  white count 3.8, hemoglobin 10.9, platelet count 437.  Her liver tests were  essentially normal.  A CT scan showed no obvious return of her cancer.  There was some subcutaneous fluid in her right buttocks (she tells me this  is a very old process from when she was quite young and she had diphtheria  shot that turned into an abscess.   REVIEW OF SYSTEMS:  Notable for weight loss as noted above, otherwise,  essentially normal and is available on her on her intake sheet.   PAST MEDICAL HISTORY:  Terminal ileal carcinoid surgically removed with  metastases to two nearby lymph nodes described in full above.  Hypertension,  asthma,  arthritis, anxiety.   CURRENT MEDICATIONS:  Aspirin, Prilosec, Centrum, Os-Cal, Diovan, Reglan,  potassium.   ALLERGIES:  No known drug allergies.   SOCIAL HISTORY:  Widowed, lives by herself, non-smoker, non-drinker.   FAMILY HISTORY:  No history of colon cancer or colon polyps in the family.   PHYSICAL EXAMINATION:  VITAL SIGNS:  5 foot 7 inches, weight 172 pounds, blood pressure 130/64,  pulse 72.  CONSTITUTIONAL:  Generally well appearing.  NEUROLOGIC:  Alert and oriented x3.  HEENT:  Extraocular movements intact.  Mouth and oropharynx moist.  No  lesions.  NECK:  Supple, no lymphadenopathy.  CARDIOVASCULAR:  Heart regular rate and rhythm.  LUNGS:  Clear to auscultation bilaterally.  ABDOMEN:  Soft, nontender, nondistended, normal bowel sounds, well healed  midline scar.  BUTTOCKS:  Nontender fluid collection in subcutaneous tissue, obvious this  is an old process.  EXTREMITIES:  No lower extremity edema.   ASSESSMENT/PLAN:  77 year old woman with history of terminal ileal carcinoid  with metastases to  nearby lymph nodes resected 2002.  I think a colonoscopy  is definitely warranted since she had this ileal carcinoid and she now has  some weight loss.  She is adamantly against a colonoscopy.  I offered a  barium enema to see if there is evidence on that and she said that would not  change her mind.  She will, however, call if she has changed her  mind.  She  stated many times that she just feels she is too old to undergo such a thing  and I told her that we had many patients her age or older who tolerated the  procedure just fine, but this was not successful in changing her decision.  I gave her my card and she will call back if she has any further questions  or concerns.                                   Rachael Fee, MD   DPJ/MedQ  DD:  07/15/2006  DT:  07/16/2006  Job #:  [id number]   cc:   Valetta Mole. Swords, MD  Adolph Pollack, M.D.

## 2011-03-08 NOTE — Assessment & Plan Note (Signed)
HISTORY:  Becky Morales returns today after I last saw her August 24, 2004.  She is an 77 year old female with low back pain radiating into her legs.  Her MRI of her lumbar spine showed L2-3, L3-4, 4-5 spinal stenosis central  and then left L5-S1 foraminal stenosis.   She has made some good progress in therapy, has improved her pain level by  over 50%, she has excellent compliance, has not learned her leg strength  home exercise program in body mechanics.  She tried Ultram but this caused  excessive dizziness or drowsiness.   Her leg pain is her biggest complaint at this time.   REVIEW OF SYSTEMS:  Positive shortness of breath only with activity and  increased heart rate.  She has some pulling around her abdominal incisions  with some of the back exercises; I reassured her on this point.   EXAMINATION:  Blood pressure 156/66, pulse 95, respiratory rate 16, O2  saturation 98% room air.  Gait is without good heel strike, crouched,  stenotic appearing.  Affect is __________  .   She has reduced deep tendon reflexes bilateral lower extremities.  She has  good muscle strength bilateral quads and ankle dorsiflexors.  She has some  crepitus with range of motion of knees left greater than right and mildly  reduced hip internal-external rotation right greater than left.   IMPRESSION:  X-ray reports reveal advanced patellofemoral arthritis  bilaterally, mild to moderate hip DJD.   PLAN:  1.  We will discontinue Ultram.  2.  Trial Neurontin one at bedtime 100 mg work up by one tablet per week to      a maximum of four.  3.  I will see her back in 1 month.  4.  Continue physical therapy, finish this out over the next 1-2 weeks.      Encouraged her home exercise program.  5.  Consider Lidoderm patch for knees if Neurontin not particularly helpful      or oversedating.       AEK/MedQ  D:  09/24/2004 09:25:59  T:  09/24/2004 17:29:52  Job #:  [id number]   cc:   Valetta Mole. Swords, M.D. [facility]   [facility]  [address]

## 2011-03-08 NOTE — Assessment & Plan Note (Signed)
[facility]                                 ON-CALL NOTE   RAELEE, ROSSMANN                       MRN:          [id number]  DATE:12/19/2007                            DOB:          1920-09-30    TIME OF CALL:  10:13 a.m.   PHONE NUMBER:  [phone].   OBJECTIVE:  The patient says she cannot take Norvasc and Dr. Cato Mulligan put  her back on Diovan 160/12.5.  Per the chart, her old bottle says 320/25.  Prescription is called in for her old prescription at [facility]  at [phone].   PRIMARY CARE Sofya Moustafa:  Dr. Cato Mulligan.  Home office is [place].     Arta Silence, MD  Electronically Signed    RNS/MedQ  DD: 12/19/2007  DT: 12/20/2007  Job #: [id number]

## 2011-03-08 NOTE — Consult Note (Signed)
[facility]  Patient:    Becky Morales, Becky Morales                          MRN: [id number] Proc. Date: 11/02/00 Adm. Date:  [id number] Attending:  Corwin Levins CC:         Corwin Levins, M.D. [facility]  Bruce H. Swords, M.D. [facility]  H. Retia Passe, Montez Hageman., M.D.   Consultation Report  REQUESTING PHYSICIAN:  Corwin Levins, M.D.  REASON FOR CONSULTATION:  Small bowel obstruction.  HISTORY OF PRESENT ILLNESS:  This is a 77 year old female who was in her normal state of health until Saturday when she began having some abdominal pain and persistent nausea and vomiting.  Saturday night she presented to the emergency department and was admitted by Dr. Jonny Ruiz.  She had had a small bowel movement on Friday.  She did not have any fever or chills.  She has not had any previous abdominal operations.  PAST MEDICAL HISTORY: 1. Hypertension. 2. Degenerative joint disease. 3. Urinary incontinence. 4. Anxiety disorder.  PAST SURGICAL HISTORY:  Tonsillectomy.  ALLERGIES:  None reported.  CURRENT MEDICATIONS:  Tranxene, Rocephin, insulin sliding scale, Ativan p.r.n., Phenergan p.r.n.  SOCIAL HISTORY:  She denies tobacco use.  She occasionally has an alcoholic beverage.  REVIEW OF SYSTEMS:  GASTROINTESTINAL:  She denies any hepatitis, diverticulitis, peptic ulcer disease, or gallbladder disease.  RENAL:  She denies kidney stones.  HEMATOLOGIC:  She denies bleeding disorders.  PHYSICAL EXAMINATION:  GENERAL:  Ill-appearing female who is yet still pleasant and cooperative.  VITAL SIGNS:  Temperature 98.6, blood pressure 156/64, pulse 100.  NECK:  No palpable masses or adenopathy.  ABDOMEN:  Slightly ______, distended, and quiet.  No hernias are present in the groin or in the periumbilical area.  There is mild lower abdominal tenderness present.  No scar is noted.  LABORATORIES:  White blood cell count 10,000, hemoglobin 13.2.  Mild elevation of one transaminase and alkaline  phosphatase.  Abdominal x-rays compared with yesterday there is increased small bowel distention with air fluid levels present.  IMPRESSION:  Intestinal obstruction without obvious cause.  Etiology can include adhesions versus neoplasm versus internal hernia.  No external hernia is appreciated.  PLAN/RECOMMENDATION:  Exploratory laparotomy for small intestinal obstruction. I explained the procedure, rationale, and risks to her including, but not limited to, bleeding, infection, intra-abdominal organ damage, and the risk of anesthesia.  Both she and her family seem to understand this and are agreeable to proceeding. DD:  11/02/00 TD:  11/02/00 Job: [id number]

## 2011-03-08 NOTE — Discharge Summary (Signed)
[facility]  Patient:    Becky Morales, Becky Morales                          MRN: [id number] Adm. Date:  [id number] Disc. Date: 11/19/00 Attending:  Arlis Porta CC:         Valetta Mole. Swords, M.D. [facility]  Marnee Guarneri, M.D.   Discharge Summary  PRINCIPLE DISCHARGE DIAGNOSIS:  Small bowel obstruction secondary to malignant carcinoid tumor.  SECONDARY DISCHARGE DIAGNOSES: 1. Prolonged postoperative ileus. 2. Malnutrition. 3. Deconditioned state. 4. Urinary tract infection. 5. Wound infection. 6. Hypertension. 7. Hyperglycemia. 8. Hypokalemia.  PROCEDURES: 1. Exploratory laparotomy. Right colectomy and resection of distal ileum,    November 02, 2000. 2. Insertion of a left subclavian vein central line, November 11, 2000.  REASON FOR ADMISSION:  This is a 77 year old female admitted by Dr. Oliver Barre November 01, 2000. She was admitted because she had abdominal pain, distention, cramping, and nausea and vomiting. It was noted when seem in the emergency department that clinical findings and radiographic findings were consistent with small intestine obstruction.  HOSPITAL COURSE:  I was asked to see her the day after her admission and she was taken to the operating room where she underwent procedure #1. The specimen was sent to pathology. Pathology came back a malignant carcinoid tumor with two lymph nodes of the mesentery involved.  I did not see any obvious hepatic involvement during the operation. Her hypertension was managed postoperatively with intravenous labetalol. She also had hyperglycemia and sliding scale was started. Medical oncology consultation was obtained and recommendation for CT scan of the abdomen was recommending for staging; however, she wanted to wait on this. She had a persistent postoperative ileus. We tried to remove her nasogastric tube once and give her a diet, but she began vomiting again. She also had some hypokalemia at that  time. Subsequently a central line was placed and she was started on total parenteral nutrition. Physical therapy became involved and assisted in ambulating her. She continued to have a low grade fever and had a urinalysis obtained which was consistent with urinary tract infection. She was coughing up some brownish sputum but the chest x-ray did not demonstrate pneumonia. She is empirically placed on Tequin.  Subsequently her ileus began to resolve and she was started on a diet. Her wound began turning red and the staples were removed and the wound infection was drained and packed in part of the wound. The rest of the wound looked good. She began to be able to be ambulatory with a walker independently taking herself to and from the bathroom. Physical therapy discharged her from their care. She was afebrile. Oral diet was increased and was improving and she subsequently is able to be discharged to home on November 19, 2000.  DISPOSITION:  Discharged home in satisfactory condition November 19, 2000. She will have home health nurses coming out twice a day to do her dressing changes. There also will be a home health physical therapist coming out to assess her house for safety with respect to the walker. She will take all of her preoperative medications and Darvocet for pain. I recommended she see Dr. Birdie Sons in two to three weeks for further evaluation of her hypoglycemia. She will also need to see Dr. Katrinka Blazing with the CT scan. I will see her back in one week. DD:  11/19/00 TD:  11/19/00 Job: [id number]

## 2011-03-08 NOTE — H&P (Signed)
[facility]  Patient:    Becky Morales, Becky Morales Visit Number: [id number] MRN: [id number]          Service Type: MED Location: 3W [phone] Attending Physician:  Judie Petit Dictated by:   Valetta Mole Swords, M.D. [facility] Admit Date:  04/09/2002 Discharge Date: 04/11/2002                           History and Physical  CHIEF COMPLAINT:  Chest pain.  HISTORY OF PRESENT ILLNESS:  The patient is an 77 year old female who has been generally healthy.  At 11 oclock yesterday evening, she developed the onset of left-sided substernal chest pain with some radiation to the left shoulder and arm associated with diaphoresis.  She describes her chest as feeling tight.  There were no other associated symptoms.  No modifying factors. Discomfort started at rest.  Duration three hours.  She came to the emergency department and was given nitroglycerin with possibly some relief.  Pain persisted, was then given a GI cocktail with relief of her symptoms.  She has never had any previous similar symptoms.  She does admit to a history of heartburn which has not been treated.  There is no history of heart disease.  PAST MEDICAL HISTORY:  Her past medical history is significant for a carcinoid tumor, status post resection.  She has had diet-controlled diabetes.  She has a history of hypertension.  MEDICATIONS: 1. Norvasc 5 mg p.o. q.d. 2. Lasix 80 mg p.o. q.d. 3. K-Dur 20 mEq p.o. q.d. 4. Tranxene 7.5 mg p.o. q.h.s. p.r.n.  FAMILY HISTORY:  Father deceased of "heart trouble" in his 66s.  Mother deceased at the age of 12 from ovarian cancer.  SOCIAL HISTORY:  She lives alone.  She is a nonsmoker.  She has nieces in [place] who help take care of her.  She has no children.  REVIEW OF SYSTEMS:  She admits to a headache after taking the nitroglycerin. She has no other complaints in the review of systems other than those listed above.    PHYSICAL EXAMINATION:  VITAL SIGNS:   Temperature is 98.1, blood pressure 131/52, pulse 94, respirations 18.  GENERAL:  In general, she appears as a well-developed, well-nourished African American in no acute distress.  She appears younger than her stated age.  She is quite pleasant.  HEENT:  Atraumatic, normocephalic.  Extraocular muscles are intact. Conjunctivae are pink.  NECK:  Neck is supple without lymphadenopathy, thyromegaly, jugular venous distention or carotid bruits.  CHEST:  Chest is clear to auscultation without any increased work of breathing.  There is no dullness to percussion.  CARDIAC:  S1 and S2 are normal with a 1-2/6 holosystolic murmur at the apex.  ABDOMEN:  Active bowel sounds, soft, nontender.  There is no hepatosplenomegaly and no masses are palpated.  EXTREMITIES:  There is no clubbing, cyanosis, or edema.  Peripheral pulses are normal.  NEUROLOGIC:  She is alert and oriented, without any motor or sensory deficits. Her cranial nerves appear intact.  LABORATORY AND ACCESSORY DATA:  Troponin I 0.03.  CMET is normal except for a glucose of 140 and an albumin of 3.4.  CK and CK-MB are normal.  CBC is normal except for a hemoglobin of 11.4.  EKG demonstrates normal sinus rhythm with poor R wave progression, V1 through V3.  Premature ventricular complexes are noted.  ASSESSMENT AND PLAN: 1. Chest discomfort, unknown cause:  The patient needs inpatient  evaluation.    We will rule out myocardial infarction with cardiac enzymes and repeat    electrocardiograms.  Original enzymes are negative.  We will try to set up    a stress-only Cardiolite.  I think if the Cardiolite is normal, she can be    safely discharged for further outpatient evaluation. 2. Gastrointestinal:  The patients symptoms may be related to    gastroesophageal reflux disease.  We will start a proton pump inhibitor.  3. Anemia:  This can be further evaluated as an outpatient but I will obtain    iron studies and vitamin B12  and folate levels in the hospital. Dictated by:   Valetta Mole. Swords, M.D. [facility] Attending Physician:  Judie Petit DD:  04/09/02 TD:  04/09/02 Job: [id number]

## 2011-04-03 ENCOUNTER — Other Ambulatory Visit: Payer: Self-pay | Admitting: Internal Medicine

## 2011-04-18 ENCOUNTER — Encounter: Payer: Self-pay | Admitting: Podiatry

## 2011-04-18 DIAGNOSIS — B351 Tinea unguium: Secondary | ICD-10-CM

## 2011-05-30 ENCOUNTER — Ambulatory Visit (INDEPENDENT_AMBULATORY_CARE_PROVIDER_SITE_OTHER): Payer: Medicare Other | Admitting: Internal Medicine

## 2011-05-30 ENCOUNTER — Encounter: Payer: Self-pay | Admitting: Internal Medicine

## 2011-05-30 VITALS — BP 130/70 | HR 72 | Temp 97.7°F | Wt 158.0 lb

## 2011-05-30 DIAGNOSIS — Z23 Encounter for immunization: Secondary | ICD-10-CM

## 2011-05-30 DIAGNOSIS — R5383 Other fatigue: Secondary | ICD-10-CM | POA: Insufficient documentation

## 2011-05-30 DIAGNOSIS — R5381 Other malaise: Secondary | ICD-10-CM

## 2011-05-30 LAB — CBC WITH DIFFERENTIAL/PLATELET
Basophils Relative: 0.4 % (ref 0.0–3.0)
Eosinophils Absolute: 0.1 10*3/uL (ref 0.0–0.7)
Eosinophils Relative: 1.9 % (ref 0.0–5.0)
Hemoglobin: 12.3 g/dL (ref 12.0–15.0)
Lymphocytes Relative: 25.6 % (ref 12.0–46.0)
MCHC: 33.2 g/dL (ref 30.0–36.0)
MCV: 97.1 fl (ref 78.0–100.0)
Monocytes Absolute: 0.6 10*3/uL (ref 0.1–1.0)
Neutro Abs: 2.7 10*3/uL (ref 1.4–7.7)
RBC: 3.82 Mil/uL — ABNORMAL LOW (ref 3.87–5.11)

## 2011-05-30 LAB — BASIC METABOLIC PANEL
GFR: 49.99 mL/min — ABNORMAL LOW (ref >60.00)
Potassium: 4.8 mEq/L (ref 3.5–5.1)
Sodium: 134 mEq/L — ABNORMAL LOW (ref 135–145)

## 2011-05-30 LAB — HEPATIC FUNCTION PANEL
AST: 22 U/L (ref 0–37)
Alkaline Phosphatase: 114 U/L (ref 39–117)
Total Bilirubin: 1 mg/dL (ref 0.3–1.2)

## 2011-05-30 MED ORDER — CHLORPHENIRAMINE-HYDROCODONE 8-10 MG/5ML PO LQCR: 5.0000 mL | Freq: Every day | ORAL | Status: DC | PRN

## 2011-05-30 MED ORDER — HYDROCOD POLST-CHLORPHEN POLST 10-8 MG/5ML PO LQCR: 5.0000 mL | Freq: Every day | ORAL | Status: DC | PRN

## 2011-05-30 NOTE — Assessment & Plan Note (Signed)
She has a constellation of sxs best categorized by fatigue Will check labs and will make plan after review of meds.

## 2011-05-30 NOTE — Progress Notes (Signed)
  Subjective:    Patient ID: Becky Morales, female    DOB: 1920-03-01, 77 y.o.   MRN: [id number]  HPI  She complains of fatigue, lightheadedness and frequent cough and biweekly headache. These sxs have been ongoing for several weeks. No fever, chills, sweats.  She is treated for htn---no objective evidence of orthostasis but she does not monitor bp at home.   Past Medical History  Diagnosis Date  . CARCINOID TUMOR 04/13/2007  . HYPERTENSION 04/13/2007  . ALLERGIC RHINITIS 04/13/2007  . GERD 04/13/2007  . HYPERGLYCEMIA 04/13/2007  . CHOLELITHIASIS 05/02/2010   Past Surgical History  Procedure Date  . Sbo surgery     reports that she has never smoked. She does not have any smokeless tobacco history on file. She reports that she does not drink alcohol or use illicit drugs. family history includes Heart attack in her brother; Hyperlipidemia in her brother; Hypertension in her brother, father, and mother; and Uterine cancer in her mother. No Known Allergies   Review of Systems  patient denies chest pain, shortness of breath, orthopnea. Denies lower extremity edema, abdominal pain, change in appetite, change in bowel movements. Patient denies rashes, musculoskeletal complaints. No other specific complaints in a complete review of systems.      Objective:   Physical Exam  Well-developed well-nourished female in no acute distress. HEENT exam atraumatic, normocephalic, extraocular muscles are intact. Neck is supple. No jugular venous distention no thyromegaly. Chest clear to auscultation without increased work of breathing. Cardiac exam S1 and S2 are regular. Abdominal exam active bowel sounds, soft, nontender. Extremities no edema.       Assessment & Plan:

## 2011-05-31 ENCOUNTER — Ambulatory Visit: Payer: Medicare Other | Admitting: Internal Medicine

## 2011-06-07 ENCOUNTER — Telehealth: Payer: Self-pay | Admitting: Internal Medicine

## 2011-06-07 NOTE — Telephone Encounter (Signed)
Office note says check labs and will make plan after review of meds

## 2011-06-07 NOTE — Telephone Encounter (Signed)
Have her see me in 3 months

## 2011-06-07 NOTE — Telephone Encounter (Signed)
Was seen last week and was given her lab results over the phone. She wants to know when to fup again with Dr Cato Mulligan? Please call [phone]. Thanks.

## 2011-07-15 ENCOUNTER — Encounter: Payer: Self-pay | Admitting: Internal Medicine

## 2011-07-15 ENCOUNTER — Ambulatory Visit (INDEPENDENT_AMBULATORY_CARE_PROVIDER_SITE_OTHER): Payer: Medicare Other | Admitting: Internal Medicine

## 2011-07-15 VITALS — BP 146/70 | HR 84 | Temp 97.6°F | Wt 156.0 lb

## 2011-07-15 DIAGNOSIS — Z23 Encounter for immunization: Secondary | ICD-10-CM

## 2011-07-15 DIAGNOSIS — IMO0001 Reserved for inherently not codable concepts without codable children: Secondary | ICD-10-CM

## 2011-07-15 DIAGNOSIS — M791 Myalgia, unspecified site: Secondary | ICD-10-CM | POA: Insufficient documentation

## 2011-07-15 LAB — CBC WITH DIFFERENTIAL/PLATELET
Basophils Absolute: 0 10*3/uL (ref 0.0–0.1)
Eosinophils Absolute: 0.1 10*3/uL (ref 0.0–0.7)
HCT: 37 % (ref 36.0–46.0)
Hemoglobin: 12.3 g/dL (ref 12.0–15.0)
Lymphs Abs: 0.9 10*3/uL (ref 0.7–4.0)
MCHC: 33.1 g/dL (ref 30.0–36.0)
MCV: 97.3 fl (ref 78.0–100.0)
Neutro Abs: 2.4 10*3/uL (ref 1.4–7.7)
RDW: 15.7 % — ABNORMAL HIGH (ref 11.5–14.6)

## 2011-07-15 LAB — BASIC METABOLIC PANEL WITH GFR
BUN: 14 mg/dL (ref 6–23)
CO2: 27 meq/L (ref 19–32)
Calcium: 9.5 mg/dL (ref 8.4–10.5)
Chloride: 99 meq/L (ref 96–112)
Creatinine, Ser: 1.2 mg/dL (ref 0.4–1.2)
GFR: 55.94 mL/min — ABNORMAL LOW
Glucose, Bld: 105 mg/dL — ABNORMAL HIGH (ref 70–99)
Potassium: 4.7 meq/L (ref 3.5–5.1)
Sodium: 137 meq/L (ref 135–145)

## 2011-07-15 LAB — FERRITIN: Ferritin: 43.4 ng/mL (ref 10.0–291.0)

## 2011-07-15 LAB — SEDIMENTATION RATE: Sed Rate: 40 mm/h — ABNORMAL HIGH (ref 0–22)

## 2011-07-15 LAB — HIGH SENSITIVITY CRP: CRP, High Sensitivity: 1.07 mg/L (ref 0.000–5.000)

## 2011-07-15 LAB — VITAMIN B12: Vitamin B-12: 498 pg/mL (ref 211–911)

## 2011-07-15 NOTE — Progress Notes (Signed)
  Subjective:    Patient ID: Becky Morales, female    DOB: Feb 08, 1920, 77 y.o.   MRN: [id number]  HPI  Multiple complaints:  1) "hurt all over" she points to her back with radiation to flanks and hips. States that she is sore when she first gets up. sxs for two weeks. She admits to decreased appetite but she continues to eat "ok". She has chronic back pain--uses occasional hydrocodone  2) feels dizzy at times when she first stands up  3) Dyspnea--ongoing for months---no real change  She admits to generalized fatigue  Past Medical History  Diagnosis Date  . CARCINOID TUMOR 04/13/2007  . HYPERTENSION 04/13/2007  . ALLERGIC RHINITIS 04/13/2007  . GERD 04/13/2007  . HYPERGLYCEMIA 04/13/2007  . CHOLELITHIASIS 05/02/2010   Past Surgical History  Procedure Date  . Sbo surgery     reports that she has never smoked. She does not have any smokeless tobacco history on file. She reports that she does not drink alcohol or use illicit drugs. family history includes Heart attack in her brother; Hyperlipidemia in her brother; Hypertension in her brother, father, and mother; and Uterine cancer in her mother. No Known Allergies  Review of Systems  patient denies chest pain, shortness of breath, orthopnea. Denies lower extremity edema, abdominal pain, change in appetite, change in bowel movements. Patient denies rashes, musculoskeletal complaints. No other specific complaints in a complete review of systems.      Objective:   Physical Exam  Well-developed well-nourished female in no acute distress. HEENT exam atraumatic, normocephalic, extraocular muscles are intact. Neck is supple. No jugular venous distention no thyromegaly. Chest clear to auscultation without increased work of breathing. Cardiac exam S1 and S2 are regular. Abdominal exam active bowel sounds, soft, nontender. Extremities no edema. Neurologic exam she is alert without any motor sensory deficits. Gait is broad based with cane          Assessment & Plan:

## 2011-07-15 NOTE — Assessment & Plan Note (Addendum)
Associated with numerous other complaints ? PMR Check esr/crp  She is able to perform all of her activities of daily living. It could be that her symptoms are related to aging.

## 2011-07-17 ENCOUNTER — Telehealth: Payer: Self-pay

## 2011-07-17 DIAGNOSIS — M549 Dorsalgia, unspecified: Secondary | ICD-10-CM

## 2011-07-17 NOTE — Telephone Encounter (Signed)
Pt states the company [employer] did contact her and she told them if the back brace was free she would take it because she was still in pain.Pt states if the back brace is not free she does not want it.  Pt states she would like her results of her blood work called in because she is in pain; pt states a medication for pain was suppose to be called in to her pharmacy.  Pt would like a call from Dr. Marliss Coots nurse. Pt would like lab results as well.

## 2011-07-18 ENCOUNTER — Telehealth: Payer: Self-pay | Admitting: *Deleted

## 2011-07-18 MED ORDER — HYDROCODONE-ACETAMINOPHEN 5-325 MG PO TABS: 1.0000 | ORAL_TABLET | Freq: Two times a day (BID) | ORAL | Status: AC

## 2011-07-18 NOTE — Telephone Encounter (Addendum)
Pt is asking for lab results also.  Meds sent and referral made to PT.

## 2011-07-18 NOTE — Telephone Encounter (Signed)
Back brace will likely not be helpful.  Refer to PT evaluate and treat.  Can call in vicodin 5/325 1 po every 12 hours as needed.

## 2011-07-18 NOTE — Telephone Encounter (Signed)
Pt informed of lab per dr swords

## 2011-07-31 ENCOUNTER — Other Ambulatory Visit: Payer: Self-pay | Admitting: Internal Medicine

## 2011-08-05 ENCOUNTER — Ambulatory Visit: Payer: Medicare Other | Attending: Internal Medicine | Admitting: Rehabilitative and Restorative Service Providers"

## 2011-08-05 DIAGNOSIS — M545 Low back pain, unspecified: Secondary | ICD-10-CM | POA: Insufficient documentation

## 2011-08-05 DIAGNOSIS — M2569 Stiffness of other specified joint, not elsewhere classified: Secondary | ICD-10-CM | POA: Insufficient documentation

## 2011-08-05 DIAGNOSIS — IMO0001 Reserved for inherently not codable concepts without codable children: Secondary | ICD-10-CM | POA: Insufficient documentation

## 2011-08-07 ENCOUNTER — Ambulatory Visit: Payer: Medicare Other | Admitting: Physical Therapy

## 2011-08-12 ENCOUNTER — Ambulatory Visit: Payer: Medicare Other | Admitting: Physical Therapy

## 2011-08-15 ENCOUNTER — Encounter: Payer: Medicare Other | Admitting: Physical Therapy

## 2011-08-19 ENCOUNTER — Ambulatory Visit: Payer: Medicare Other | Admitting: Rehabilitative and Restorative Service Providers"

## 2011-08-20 ENCOUNTER — Encounter: Payer: Medicare Other | Admitting: Rehabilitative and Restorative Service Providers"

## 2011-08-21 ENCOUNTER — Encounter: Payer: Medicare Other | Admitting: Rehabilitative and Restorative Service Providers"

## 2011-08-22 ENCOUNTER — Encounter: Payer: Medicare Other | Admitting: Rehabilitative and Restorative Service Providers"

## 2011-08-27 ENCOUNTER — Encounter: Payer: Medicare Other | Admitting: Physical Therapy

## 2011-08-29 ENCOUNTER — Encounter (HOSPITAL_COMMUNITY): Payer: Self-pay | Admitting: Emergency Medicine

## 2011-08-29 ENCOUNTER — Emergency Department (HOSPITAL_COMMUNITY): Payer: Medicare Other

## 2011-08-29 ENCOUNTER — Encounter: Payer: Medicare Other | Admitting: Physical Therapy

## 2011-08-29 ENCOUNTER — Inpatient Hospital Stay (HOSPITAL_COMMUNITY)
Admission: EM | Admit: 2011-08-29 | Discharge: 2011-09-05 | DRG: 981 | Disposition: A | Discharge status: Home Health | Payer: Medicare Other | Attending: Internal Medicine | Admitting: Internal Medicine

## 2011-08-29 ENCOUNTER — Telehealth: Payer: Self-pay | Admitting: Internal Medicine

## 2011-08-29 DIAGNOSIS — K219 Gastro-esophageal reflux disease without esophagitis: Secondary | ICD-10-CM | POA: Diagnosis present

## 2011-08-29 DIAGNOSIS — N390 Urinary tract infection, site not specified: Secondary | ICD-10-CM | POA: Diagnosis present

## 2011-08-29 DIAGNOSIS — K5900 Constipation, unspecified: Secondary | ICD-10-CM | POA: Diagnosis present

## 2011-08-29 DIAGNOSIS — M545 Low back pain: Secondary | ICD-10-CM

## 2011-08-29 DIAGNOSIS — A498 Other bacterial infections of unspecified site: Secondary | ICD-10-CM | POA: Diagnosis present

## 2011-08-29 DIAGNOSIS — I10 Essential (primary) hypertension: Secondary | ICD-10-CM | POA: Diagnosis present

## 2011-08-29 DIAGNOSIS — M48061 Spinal stenosis, lumbar region without neurogenic claudication: Secondary | ICD-10-CM

## 2011-08-29 DIAGNOSIS — M5137 Other intervertebral disc degeneration, lumbosacral region: Principal | ICD-10-CM | POA: Diagnosis present

## 2011-08-29 DIAGNOSIS — I214 Non-ST elevation (NSTEMI) myocardial infarction: Secondary | ICD-10-CM

## 2011-08-29 DIAGNOSIS — E871 Hypo-osmolality and hyponatremia: Secondary | ICD-10-CM | POA: Diagnosis not present

## 2011-08-29 DIAGNOSIS — I2510 Atherosclerotic heart disease of native coronary artery without angina pectoris: Secondary | ICD-10-CM | POA: Diagnosis present

## 2011-08-29 DIAGNOSIS — M51379 Other intervertebral disc degeneration, lumbosacral region without mention of lumbar back pain or lower extremity pain: Principal | ICD-10-CM | POA: Diagnosis present

## 2011-08-29 HISTORY — DX: Other allergy status, other than to drugs and biological substances: Z91.09

## 2011-08-29 HISTORY — DX: Unspecified osteoarthritis, unspecified site: M19.90

## 2011-08-29 HISTORY — DX: Malignant (primary) neoplasm, unspecified: C80.1

## 2011-08-29 LAB — CBC
MCH: 31.8 pg (ref 26.0–34.0)
MCV: 91.1 fL (ref 78.0–100.0)
Platelets: 402 10*3/uL — ABNORMAL HIGH (ref 150–400)
RBC: 3.93 MIL/uL (ref 3.87–5.11)
RDW: 14.7 % (ref 11.5–15.5)
WBC: 5.2 10*3/uL (ref 4.0–10.5)

## 2011-08-29 LAB — POCT I-STAT, CHEM 8
Hemoglobin: 13.6 g/dL (ref 12.0–15.0)
Potassium: 4.3 mEq/L (ref 3.5–5.1)
Sodium: 134 mEq/L — ABNORMAL LOW (ref 135–145)
TCO2: 24 mmol/L (ref 0–100)

## 2011-08-29 LAB — URINALYSIS, ROUTINE W REFLEX MICROSCOPIC
Bilirubin Urine: NEGATIVE
Hgb urine dipstick: NEGATIVE
Ketones, ur: NEGATIVE mg/dL
Protein, ur: NEGATIVE mg/dL
Specific Gravity, Urine: 1.007 (ref 1.005–1.030)
Urobilinogen, UA: 0.2 mg/dL (ref 0.0–1.0)

## 2011-08-29 LAB — DIFFERENTIAL
Basophils Absolute: 0 10*3/uL (ref 0.0–0.1)
Eosinophils Absolute: 0.1 10*3/uL (ref 0.0–0.7)
Eosinophils Relative: 1 % (ref 0–5)
Lymphocytes Relative: 22 % (ref 12–46)
Neutrophils Relative %: 66 % (ref 43–77)

## 2011-08-29 LAB — URINE MICROSCOPIC-ADD ON

## 2011-08-29 MED ORDER — OLMESARTAN MEDOXOMIL 20 MG PO TABS
20.0000 mg | ORAL_TABLET | Freq: Every day | ORAL | Status: DC
  Administered 2011-08-30 – 2011-09-02 (×4): 20 mg via ORAL
  Filled 2011-08-29 (×5): qty 1

## 2011-08-29 MED ORDER — DEXTROSE 5 % IV SOLN
1.0000 g | INTRAVENOUS | Status: DC
  Administered 2011-08-29 – 2011-09-01 (×4): 1 g via INTRAVENOUS
  Filled 2011-08-29 (×4): qty 10

## 2011-08-29 MED ORDER — HYDROCHLOROTHIAZIDE 12.5 MG PO CAPS
12.5000 mg | ORAL_CAPSULE | Freq: Once | ORAL | Status: DC
  Filled 2011-08-29: qty 1

## 2011-08-29 MED ORDER — POTASSIUM CHLORIDE CRYS ER 10 MEQ PO TBCR
10.0000 meq | EXTENDED_RELEASE_TABLET | Freq: Every day | ORAL | Status: DC
  Administered 2011-08-30 – 2011-09-02 (×4): 10 meq via ORAL
  Filled 2011-08-29 (×5): qty 1

## 2011-08-29 MED ORDER — OXYCODONE-ACETAMINOPHEN 5-325 MG PO TABS
1.0000 | ORAL_TABLET | Freq: Once | ORAL | Status: AC
  Administered 2011-08-29: 1 via ORAL
  Filled 2011-08-29: qty 1

## 2011-08-29 MED ORDER — VALSARTAN-HYDROCHLOROTHIAZIDE 160-12.5 MG PO TABS: 1.0000 | ORAL_TABLET | Freq: Every day | ORAL | Status: DC

## 2011-08-29 MED ORDER — LORATADINE 10 MG PO TABS
10.0000 mg | ORAL_TABLET | Freq: Every day | ORAL | Status: DC
  Administered 2011-08-30 – 2011-09-05 (×6): 10 mg via ORAL
  Filled 2011-08-29 (×8): qty 1

## 2011-08-29 MED ORDER — DOCUSATE SODIUM 100 MG PO CAPS
100.0000 mg | ORAL_CAPSULE | Freq: Two times a day (BID) | ORAL | Status: DC
  Administered 2011-08-30 – 2011-09-05 (×12): 100 mg via ORAL
  Filled 2011-08-29 (×18): qty 1

## 2011-08-29 MED ORDER — HYDRALAZINE HCL 20 MG/ML IJ SOLN
10.0000 mg | Freq: Four times a day (QID) | INTRAMUSCULAR | Status: DC | PRN
  Filled 2011-08-29: qty 1

## 2011-08-29 MED ORDER — ONDANSETRON HCL 4 MG PO TABS
4.0000 mg | ORAL_TABLET | Freq: Four times a day (QID) | ORAL | Status: DC | PRN
  Administered 2011-09-01: 4 mg via ORAL
  Filled 2011-08-29: qty 1

## 2011-08-29 MED ORDER — ONDANSETRON 8 MG PO TBDP
8.0000 mg | ORAL_TABLET | Freq: Once | ORAL | Status: AC
  Administered 2011-08-29: 8 mg via ORAL
  Filled 2011-08-29: qty 1

## 2011-08-29 MED ORDER — ALUM & MAG HYDROXIDE-SIMETH 200-200-20 MG/5ML PO SUSP: 30.0000 mL | Freq: Four times a day (QID) | ORAL | Status: DC | PRN

## 2011-08-29 MED ORDER — FERROUS SULFATE 325 (65 FE) MG PO TABS
325.0000 mg | ORAL_TABLET | Freq: Every day | ORAL | Status: DC
  Administered 2011-08-30 – 2011-09-05 (×5): 325 mg via ORAL
  Filled 2011-08-29 (×10): qty 1

## 2011-08-29 MED ORDER — MORPHINE SULFATE 2 MG/ML IJ SOLN
1.0000 mg | INTRAMUSCULAR | Status: DC | PRN
  Administered 2011-08-30 – 2011-09-03 (×2): 1 mg via INTRAVENOUS
  Filled 2011-08-29 (×3): qty 1

## 2011-08-29 MED ORDER — ENOXAPARIN SODIUM 40 MG/0.4ML ~~LOC~~ SOLN
40.0000 mg | SUBCUTANEOUS | Status: DC
  Administered 2011-08-29 – 2011-09-01 (×4): 40 mg via SUBCUTANEOUS
  Filled 2011-08-29 (×5): qty 0.4

## 2011-08-29 MED ORDER — SODIUM CHLORIDE 0.9 % IV SOLN
INTRAVENOUS | Status: DC
  Administered 2011-08-29 – 2011-08-31 (×3): via INTRAVENOUS

## 2011-08-29 MED ORDER — ZOLPIDEM TARTRATE 5 MG PO TABS
5.0000 mg | ORAL_TABLET | Freq: Every evening | ORAL | Status: DC | PRN
  Administered 2011-08-31: 5 mg via ORAL
  Filled 2011-08-29: qty 1

## 2011-08-29 MED ORDER — MORPHINE SULFATE 4 MG/ML IJ SOLN
4.0000 mg | Freq: Once | INTRAMUSCULAR | Status: AC
  Administered 2011-08-29: 4 mg via INTRAMUSCULAR
  Filled 2011-08-29: qty 1

## 2011-08-29 MED ORDER — HYDROCHLOROTHIAZIDE 12.5 MG PO CAPS
12.5000 mg | ORAL_CAPSULE | Freq: Every day | ORAL | Status: DC
  Administered 2011-08-30 – 2011-08-31 (×2): 12.5 mg via ORAL
  Filled 2011-08-29 (×3): qty 1

## 2011-08-29 MED ORDER — ACETAMINOPHEN 650 MG RE SUPP: 650.0000 mg | Freq: Four times a day (QID) | RECTAL | Status: DC | PRN

## 2011-08-29 MED ORDER — BENZONATATE 100 MG PO CAPS
100.0000 mg | ORAL_CAPSULE | Freq: Three times a day (TID) | ORAL | Status: DC
  Administered 2011-08-30 – 2011-09-03 (×13): 100 mg via ORAL
  Filled 2011-08-29 (×21): qty 1

## 2011-08-29 MED ORDER — HYDROCODONE-ACETAMINOPHEN 5-325 MG PO TABS: 1.0000 | ORAL_TABLET | ORAL | Status: DC | PRN

## 2011-08-29 MED ORDER — OXYCODONE HCL 5 MG PO TABS
5.0000 mg | ORAL_TABLET | ORAL | Status: DC | PRN
  Administered 2011-08-29 – 2011-09-04 (×4): 5 mg via ORAL
  Filled 2011-08-29 (×4): qty 1

## 2011-08-29 MED ORDER — OLMESARTAN MEDOXOMIL 20 MG PO TABS
20.0000 mg | ORAL_TABLET | Freq: Once | ORAL | Status: AC
  Administered 2011-09-02: 20 mg via ORAL
  Filled 2011-08-29: qty 1

## 2011-08-29 MED ORDER — SENNOSIDES-DOCUSATE SODIUM 8.6-50 MG PO TABS
1.0000 | ORAL_TABLET | Freq: Every day | ORAL | Status: DC | PRN
  Administered 2011-08-31: 1 via ORAL
  Filled 2011-08-29 (×2): qty 1

## 2011-08-29 MED ORDER — ACETAMINOPHEN 325 MG PO TABS
650.0000 mg | ORAL_TABLET | Freq: Four times a day (QID) | ORAL | Status: DC | PRN
  Administered 2011-08-29 – 2011-09-03 (×2): 650 mg via ORAL
  Filled 2011-08-29 (×3): qty 2

## 2011-08-29 MED ORDER — PANTOPRAZOLE SODIUM 40 MG PO TBEC
40.0000 mg | DELAYED_RELEASE_TABLET | Freq: Every day | ORAL | Status: DC
  Administered 2011-08-30 – 2011-09-05 (×6): 40 mg via ORAL
  Filled 2011-08-29 (×9): qty 1

## 2011-08-29 MED ORDER — ONDANSETRON HCL 4 MG/2ML IJ SOLN
4.0000 mg | Freq: Four times a day (QID) | INTRAMUSCULAR | Status: DC | PRN
  Administered 2011-08-30 – 2011-09-03 (×2): 4 mg via INTRAVENOUS
  Filled 2011-08-29 (×2): qty 2

## 2011-08-29 MED ORDER — THERA M PLUS PO TABS
1.0000 | ORAL_TABLET | Freq: Every day | ORAL | Status: DC
  Administered 2011-08-30 – 2011-09-05 (×5): 1 via ORAL
  Filled 2011-08-29 (×8): qty 1

## 2011-08-29 MED ORDER — ASPIRIN EC 81 MG PO TBEC
81.0000 mg | DELAYED_RELEASE_TABLET | Freq: Every day | ORAL | Status: DC
  Administered 2011-08-30 – 2011-09-01 (×3): 81 mg via ORAL
  Filled 2011-08-29 (×4): qty 1

## 2011-08-29 NOTE — ED Notes (Signed)
Pt to ct 

## 2011-08-29 NOTE — ED Notes (Signed)
care assumed.  Patient moaning and crying with pain.  Oxyi IR given for pain .  Eating crackers and drinking soup. tolerating it well

## 2011-08-29 NOTE — Telephone Encounter (Signed)
Pt called and said that she is in extreme pain and when pt tries to stand up, her legs collapse under her and she falls. Pt said that she feels as if she needs to go on to the hosp. Pt has been advised to go to ER per Triage Nurse. Pts niece is calling paramedics for pt to be taken to [facility] ER.

## 2011-08-29 NOTE — Discharge Planning (Signed)
Paged Dr Isla Pence

## 2011-08-29 NOTE — H&P (Signed)
Becky Morales MRN: [id number] DOB/AGE: [birth year] 75 y.o. Primary Care Physician:SWORDS,BRUCE Sherilyn Cooter, MD, MD Admit date: 08/29/2011 Chief Complaint:Low back pain /Inability to ambulate HPI:  Becky Morales is a pleasant 77 year old African American female, history of carcinoid tumor, hypertension, gastroesophageal reflux disease, and a spinal stenosis per CT, who presents to the ED with worsening low back pain and inability to ambulate. She states she was seen by her PCP and had been having back pain for several months and was sent to physical therapy. Patient attended physical therapy last week, and was doing better until 2 days prior to admission. She states that 2 nights ago prior to admission after she went to the bathroom, she tried to sit down and felt that her legs gave away. He denied any bowel incontinence, patient denied any urinary incontinence ,patient denied any saddle anesthesia. Patient stated that she felt better the next day. Night prior to admission patient stated that her legs give away again and was unable to ambulate. Patient also complaining of right hip pain and cramps in the lower extremities. As stated she called her PCPs office spoke to the nurse and was told to come to the ED. And subsequently called EMS and presented to the ED. she denies any fever, no chills, no chest pain, no shortness of breath, no nausea, had one episode of emesis this afternoon and denies any abdominal pain no diarrhea no constipation. Patient denies any tingling in her lower extremities. She denies any dysuria. She does complain of increased urinary frequency and malodorous urine and also endorses some bilateral lower extremity weakness  and also some right hand tingling that has been chronic in nature. Patient was seen in the ED, urinalysis which was done was consistent with a urinary tract infection. Trace of the L-spine were consistent with degenerative disc disease. CT scan of the L-spine showed severe spinal  stenosis with progression of disease since 2011. We were called to admit the patient for further evaluation and management. EDPA has a consult to neurosurgery for further evaluation and recommendations.  Past Medical History  Diagnosis Date  . CARCINOID TUMOR 04/13/2007  . HYPERTENSION 04/13/2007  . ALLERGIC RHINITIS 04/13/2007  . GERD 04/13/2007  . HYPERGLYCEMIA 04/13/2007  . CHOLELITHIASIS 05/02/2010  . Arthritis   . Cancer   . Environmental allergies     has mucus in throat  . Diphtheria     age 77    Past Surgical History  Procedure Date  . Sbo surgery   . Tonsillectomy     many years ago    Prior to Admission medications   Medication Sig Start Date End Date Taking? Authorizing Provider  aspirin 81 MG tablet Take 81 mg by mouth daily.    Yes Historical Provider, MD  calcium-vitamin D (OSCAL WITH D 500-200) 500-200 MG-UNIT per tablet Take 1 tablet by mouth daily.    Yes Historical Provider, MD  cetirizine (ZYRTEC) 10 MG tablet Take 10 mg by mouth daily.    Yes Historical Provider, MD  ferrous sulfate 325 (65 FE) MG tablet Take 325 mg by mouth daily with breakfast.    Yes Historical Provider, MD  multivitamin (THERAGRAN) per tablet Take 1 tablet by mouth daily.    Yes Historical Provider, MD  omeprazole (PRILOSEC) 20 MG capsule   07/31/11  Yes Judie Petit, MD  potassium chloride (K-DUR,KLOR-CON) 10 MEQ tablet   04/03/11  Yes Judie Petit, MD  valsartan-hydrochlorothiazide (DIOVAN HCT) 160-12.5 MG per tablet   04/03/11  Yes Judie Petit, MD    Allergies: No Known Allergies  Family History  Problem Relation Age of Onset  . Hypertension Mother   . Uterine cancer Mother   . Ovarian cancer Mother   . Hypertension Father   . Stroke Father   . Hypertension Brother   . Hyperlipidemia Brother   . Heart attack Brother   . Lung cancer Sister     Social History:  reports that she has never smoked. She has never used smokeless tobacco. She reports that she does not  drink alcohol or use illicit drugs.  ROS: All systems reviewed with the patient and was positive as per HPI otherwise all other systems are negative.  PHYSICAL EXAM: Blood pressure 183/71, pulse 90, temperature 98 F (36.7 C), temperature source Oral, resp. rate 18, SpO2 100.00%. General: Alert, awake, oriented x3, in no acute distress. HEENT: [place]/AT, PERRLA. EOMI, Oropharynx clear no lesions, no exudate. Neck supple no LAD. No JVD. No bruits, no goiter. Heart: Regular rate and rhythm, without murmurs, rubs, gallops. Lungs: Clear to auscultation bilaterally. Abdomen: Soft, nontender, nondistended, positive bowel sounds. Extremities: No clubbing cyanosis or edema with positive pedal pulses. Neuro: Grossly intact, nonfocal. Back: Mild TTP in R lower back, no crepitus, no spinal tenderness. Limited ROM.   No results found for this or any previous visit (from the past 240 hour(s)).   Lab results:  [facility] 08/29/11 1321  NA 134*  K 4.3  CL 101  CO2 --  GLUCOSE 107*  BUN 17  CREATININE 1.10  CALCIUM --  MG --  PHOS --   No results found for this basename: AST:2,ALT:2,ALKPHOS:2,BILITOT:2,PROT:2,ALBUMIN:2 in the last 72 hours No results found for this basename: LIPASE:2,AMYLASE:2 in the last 72 hours  Basename 08/29/11 1321 08/29/11 1310  WBC -- 5.2  NEUTROABS -- 3.4  HGB 13.6 12.5  HCT 40.0 35.8*  MCV -- 91.1  PLT -- 402*   No results found for this basename: CKTOTAL:3,CKMB:3,CKMBINDEX:3,TROPONINI:3 in the last 72 hours No results found for this basename: POCBNP:3 in the last 72 hours No results found for this basename: DDIMER in the last 72 hours No results found for this basename: HGBA1C:2 in the last 72 hours No results found for this basename: CHOL:2,HDL:2,LDLCALC:2,TRIG:2,CHOLHDL:2,LDLDIRECT:2 in the last 72 hours No results found for this basename: TSH,T4TOTAL,FREET3,T3FREE,THYROIDAB in the last 72 hours No results found for this basename:  VITAMINB12:2,FOLATE:2,FERRITIN:2,TIBC:2,IRON:2,RETICCTPCT:2 in the last 72 hours Imaging results:  Dg Lumbar Spine 2-3 Views  08/29/2011  *RADIOLOGY REPORT*  Clinical Data: Severe low back pain.  LUMBAR SPINE - 2-3 VIEW 08/29/2011:  Comparison: Lumbar spine MRI 07/26/2010 [facility] [address].  Lumbar spine x-rays 02/05/2010 [facility].  Findings: Lateral image was obtained using cross-table lateral technique.  Five non-rib bearing lumbar vertebrae with anatomic alignment.  No fractures.  Straightening of the usual lumbar lordosis.  Mild disc space narrowing and endplate hypertrophic changes at L2-3, L3-4, L4-5, and L5-S1, unchanged.  Thoracolumbar scoliosis convex left, unchanged.  Posterior element hypertrophy throughout the lumbar spine.  Visualized sacroiliac joints intact with degenerative changes.  Degenerative changes also noted in both hips.  IMPRESSION: No acute osseous abnormality.  Straightening of the usual lumbar lordosis which may reflect positioning and/or spasm.  Diffuse degenerative disc disease, spondylosis, and facet degenerative changes.  Scoliosis.  Degenerative changes involving the visualized sacroiliac joints and hips.  Original Report Authenticated By: Arnell Sieving, M.D.   Ct Lumbar Spine Wo Contrast  08/29/2011  *RADIOLOGY REPORT*  Clinical Data: 77 year old female with back pain, bilateral leg pain.  Remote history of colon cancer.  CT LUMBAR SPINE WITHOUT CONTRAST  Technique:  Multidetector CT imaging of the lumbar spine was performed without intravenous contrast administration. Multiplanar CT image reconstructions were also generated.  Comparison: Lumbar radiographs 08/29/2011.  MRI 07/26/2010. CT abdomen and pelvis 07/02/2006.  Findings: Negative visualized abdominal viscera.  Osteopenia.  Normal lumbar segmentation.  Chronic changes to the medial right iliac bone partially visible, likely Paget's disease and stable since 2007. Visualized sacrum appears  intact.  Negative SI joints.  Stable lumbar vertebral height and alignment.  Mild scoliosis. No acute osseous abnormality identified.  T12-L1:  Chronic circumferential disc osteophyte complex.  No significant spinal stenosis.  L1-L2:  Chronic partially calcified circumferential disc bulge.  No significant stenosis.  L2-L3:  Chronic advanced disc degeneration with bulky right far lateral disc osteophyte complex.  Moderate facet degeneration. Mild spinal, right lateral recess, right foraminal stenosis.  L3-L4:  Chronic circumferential disc bulge.  Severe facet and ligament flavum hypertrophy.  Severe spinal stenosis and moderate right foraminal stenosis may have increased.  L4-L5:  Chronic circumferential disc osteophyte complex.  Severe ligament flavum and facet hypertrophy. Chronic severe spinal stenosis and mild left foraminal stenosis.  L5-S1:  Chronic very severe facet hypertrophy on the left.  Chronic bulky left far lateral disc osteophyte complex.  Chronic severe left lateral recess stenosis and left foraminal stenosis.  IMPRESSION: 1.  Chronic severe lumbar degenerative changes including severe spinal stenosis at L3-L4 and L4-L5, with stenosis at the former appearing somewhat progressed since 2011. 2.  Chronic Paget's disease of the right hemi pelvis appears stable since 2007. 3. No acute osseous abnormality identified.  Original Report Authenticated By: Harley Hallmark, M.D.   Impression/Plan:  Principal Problem:  *Low back pain Active Problems:  Spinal stenosis of lumbar region at multiple levels  UTI (lower urinary tract infection)  1. Low back pain- likely secondary to severe spinal stenosis, which has progressed since 2011 per CT scan.  NeuroSurgery consult is pending. PT /OT. Pain management. Follow. 2. UTI - urine cultures are pending. Place on IV Rocephin. 3. HTN- resume home regimen. Hydralazine PRN. 4. Allergic rhinitis - continue home regimen of Zyrtec. 5. GERD - PPI. 6. prophylaxis -  PPI for GI, Lovenox for DVT.   THOMPSON,DANIEL 08/29/2011, 5:43 PM

## 2011-08-29 NOTE — ED Provider Notes (Signed)
Medical screening examination/treatment/procedure(s) were conducted as a shared visit with non-physician practitioner(s) and myself.  I personally evaluated the patient during the encounter  Baseline walks with cane. Pt refusing to walk here 2/2 pain, she feels that pain is inhibiting her more than weakness. No saddle anesthesia. Strength 4+/5 b/l LE limited by pain. Gross sensation intact. SLR neg b/l. CT with worsening spinal stenosis. Admit for pain control, further w/u, likely MRI  Forbes Cellar, MD 08/29/11 2025

## 2011-08-29 NOTE — ED Provider Notes (Signed)
History     CSN: [id number] Arrival date & time: 08/29/2011 10:24 AM   First MD Initiated Contact with Patient 08/29/11 1028      Chief Complaint  Patient presents with  . Back Pain    Per ems from home. Went to Physical therapy 1 week ago and has had pain since. Pain sharp and achy. Denies fall.    (Consider location/radiation/quality/duration/timing/severity/associated sxs/prior treatment) Patient is a 77 y.o. female presenting with back pain.  Back Pain  This is a new problem. The current episode started more than 1 week ago. The problem occurs daily. The problem has not changed since onset.The pain is associated with no known injury. The pain is present in the lumbar spine and gluteal region. The quality of the pain is described as burning and shooting. The pain radiates to the right thigh. The pain is moderate. The symptoms are aggravated by twisting and certain positions. The pain is the same all the time. Pertinent negatives include no chest pain, no fever, no numbness, no weight loss, no headaches, no abdominal pain, no abdominal swelling, no bowel incontinence, no perianal numbness, no bladder incontinence, no dysuria, no pelvic pain, no leg pain, no paresthesias, no paresis, no tingling and no weakness. She has tried nothing for the symptoms. Risk factors include a history of cancer.  She seen by Dr. Cato Mulligan. She apparently has had back pain for several months and was sent to physical therapy for this. She has been a total of 3 times. The last time she went was a week ago, and she has had significant pain on the right side of her back since that time. The pain seems to start in her low right back and radiates into her hip and thigh. It is a sharp shooting sensation. She has not fallen but admits some weakness with walking, and feeling like her legs are about to go out. She denies bowel or bladder dysfunction, saddle anesthesia, numbness or tingling in the legs. The patient does have a  history of carcinoid tumor, which was removed approximately 4 years ago. She does not think she's had any recent imaging of her back, but recalls having an MRI about 18 months ago which showed degenerative changes.  Past Medical History  Diagnosis Date  . CARCINOID TUMOR 04/13/2007  . HYPERTENSION 04/13/2007  . ALLERGIC RHINITIS 04/13/2007  . GERD 04/13/2007  . HYPERGLYCEMIA 04/13/2007  . CHOLELITHIASIS 05/02/2010  . Arthritis   . Cancer   . Environmental allergies     has mucus in throat  . Diphtheria     age 67    Past Surgical History  Procedure Date  . Sbo surgery   . Tonsillectomy     many years ago    Family History  Problem Relation Age of Onset  . Hypertension Mother   . Uterine cancer Mother   . Hypertension Father   . Hypertension Brother   . Hyperlipidemia Brother   . Heart attack Brother     History  Substance Use Topics  . Smoking status: Never Smoker   . Smokeless tobacco: Not on file  . Alcohol Use: No    OB History    Grav Para Term Preterm Abortions TAB SAB Ect Mult Living                  Review of Systems  Constitutional: Negative for fever, chills, weight loss, activity change, appetite change, fatigue and unexpected weight change.  HENT: Negative.   Eyes:  Negative.   Respiratory: Negative.   Cardiovascular: Negative.  Negative for chest pain.  Gastrointestinal: Negative for abdominal pain and bowel incontinence.  Genitourinary: Negative for bladder incontinence, dysuria, decreased urine volume, difficulty urinating and pelvic pain.  Musculoskeletal: Positive for back pain.  Skin: Negative for color change and rash.  Neurological: Negative for dizziness, tingling, weakness, numbness, headaches and paresthesias.  Hematological: Negative for adenopathy. Does not bruise/bleed easily.    Allergies  Review of patient's allergies indicates no known allergies.  Home Medications   Current Outpatient Rx  Name Route Sig Dispense Refill  .  ASPIRIN 81 MG PO TABS Oral Take 81 mg by mouth daily.      Marland Kitchen CALCIUM CARBONATE-VITAMIN D 500-200 MG-UNIT PO TABS Oral Take 1 tablet by mouth daily.      Marland Kitchen CETIRIZINE HCL 10 MG PO TABS Oral Take 10 mg by mouth daily.      Marland Kitchen HYDROCOD POLST-CHLORPHEN POLST 10-8 MG/5ML PO LQCR Oral Take 5 mLs by mouth daily as needed. 60 mL 0  . CHLORPHENIRAMINE-HYDROCODONE 8-10 MG/5ML PO LQCR Oral Take 5 mLs by mouth daily as needed for cough. 60 mL 0  . DIOVAN HCT 160-12.5 MG PO TABS  TAKE ONE (1) TABLET EACH DAY 30 tablet 5  . FERROUS SULFATE 325 (65 FE) MG PO TABS Oral Take 325 mg by mouth daily with breakfast.      . MULTIVITAMINS PO TABS Oral Take 1 tablet by mouth daily.      Marland Kitchen OMEPRAZOLE 20 MG PO CPDR  TAKE ONE (1) CAPSULE EACH DAY 30 capsule 5  . POTASSIUM CHLORIDE CRYS CR 10 MEQ PO TBCR  TAKE 1 TABLET EACH MORNING 30 tablet 11    BP 170/56  Pulse 84  Temp(Src) 98 F (36.7 C) (Oral)  Resp 18  SpO2 100%  Physical Exam  Nursing note and vitals reviewed. Constitutional: She is oriented to person, place, and time. She appears well-developed and well-nourished. No distress.  HENT:  Head: Normocephalic and atraumatic.  Right Ear: External ear normal.  Left Ear: External ear normal.  Mouth/Throat: No oropharyngeal exudate.  Eyes: Conjunctivae and EOM are normal. Pupils are equal, round, and reactive to light.  Neck: Normal range of motion. Neck supple. No thyromegaly present.  Cardiovascular: Normal rate and regular rhythm.  Exam reveals no gallop and no friction rub.   No murmur heard. Pulmonary/Chest: Effort normal and breath sounds normal. She exhibits no tenderness.  Abdominal: Soft. Bowel sounds are normal. She exhibits no distension. There is no tenderness.  Musculoskeletal: She exhibits no edema and no tenderness.       Lumbar back: She exhibits decreased range of motion. She exhibits no tenderness and no bony tenderness.       No palpable muscle spasm or deformity. No notable tenderness to  palpation. No midline crepitus, stepoff, or deformity.  Lymphadenopathy:    She has no cervical adenopathy.  Neurological: She is alert and oriented to person, place, and time. She has normal reflexes. No cranial nerve deficit. She exhibits normal muscle tone. Coordination normal.  Skin: Skin is warm and dry. No rash noted. She is not diaphoretic.  Psychiatric: She has a normal mood and affect.    ED Course  Procedures (including critical care time)  Labs Reviewed  URINALYSIS, ROUTINE W REFLEX MICROSCOPIC - Abnormal; Notable for the following:    Appearance CLOUDY (*)    Nitrite POSITIVE (*)    All other components within normal limits  CBC -  Abnormal; Notable for the following:    HCT 35.8 (*)    Platelets 402 (*)    All other components within normal limits  POCT I-STAT, CHEM 8 - Abnormal; Notable for the following:    Sodium 134 (*)    Glucose, Bld 107 (*)    All other components within normal limits  URINE MICROSCOPIC-ADD ON - Abnormal; Notable for the following:    Bacteria, UA FEW (*)    All other components within normal limits  DIFFERENTIAL  I-STAT, CHEM 8  URINE CULTURE   Dg Lumbar Spine 2-3 Views  08/29/2011  *RADIOLOGY REPORT*  Clinical Data: Severe low back pain.  LUMBAR SPINE - 2-3 VIEW 08/29/2011:  Comparison: Lumbar spine MRI 07/26/2010 [facility] [address]  Lumbar spine x-rays 02/05/2010 [facility].  Findings: Lateral image was obtained using cross-table lateral technique.  Five non-rib bearing lumbar vertebrae with anatomic alignment.  No fractures.  Straightening of the usual lumbar lordosis.  Mild disc space narrowing and endplate hypertrophic changes at L2-3, L3-4, L4-5, and L5-S1, unchanged.  Thoracolumbar scoliosis convex left, unchanged.  Posterior element hypertrophy throughout the lumbar spine.  Visualized sacroiliac joints intact with degenerative changes.  Degenerative changes also noted in both hips.  IMPRESSION: No acute osseous  abnormality.  Straightening of the usual lumbar lordosis which may reflect positioning and/or spasm.  Diffuse degenerative disc disease, spondylosis, and facet degenerative changes.  Scoliosis.  Degenerative changes involving the visualized sacroiliac joints and hips.  Original Report Authenticated By: Arnell Sieving, M.D.   Ct Lumbar Spine Wo Contrast  08/29/2011  *RADIOLOGY REPORT*  Clinical Data: 77 year old female with back pain, bilateral leg pain.  Remote history of colon cancer.  CT LUMBAR SPINE WITHOUT CONTRAST  Technique:  Multidetector CT imaging of the lumbar spine was performed without intravenous contrast administration. Multiplanar CT image reconstructions were also generated.  Comparison: Lumbar radiographs 08/29/2011.  MRI 07/26/2010. CT abdomen and pelvis 07/02/2006.  Findings: Negative visualized abdominal viscera.  Osteopenia.  Normal lumbar segmentation.  Chronic changes to the medial right iliac bone partially visible, likely Paget's disease and stable since 2007. Visualized sacrum appears intact.  Negative SI joints.  Stable lumbar vertebral height and alignment.  Mild scoliosis. No acute osseous abnormality identified.  T12-L1:  Chronic circumferential disc osteophyte complex.  No significant spinal stenosis.  L1-L2:  Chronic partially calcified circumferential disc bulge.  No significant stenosis.  L2-L3:  Chronic advanced disc degeneration with bulky right far lateral disc osteophyte complex.  Moderate facet degeneration. Mild spinal, right lateral recess, right foraminal stenosis.  L3-L4:  Chronic circumferential disc bulge.  Severe facet and ligament flavum hypertrophy.  Severe spinal stenosis and moderate right foraminal stenosis may have increased.  L4-L5:  Chronic circumferential disc osteophyte complex.  Severe ligament flavum and facet hypertrophy. Chronic severe spinal stenosis and mild left foraminal stenosis.  L5-S1:  Chronic very severe facet hypertrophy on the left.   Chronic bulky left far lateral disc osteophyte complex.  Chronic severe left lateral recess stenosis and left foraminal stenosis.  IMPRESSION: 1.  Chronic severe lumbar degenerative changes including severe spinal stenosis at L3-L4 and L4-L5, with stenosis at the former appearing somewhat progressed since 2011. 2.  Chronic Paget's disease of the right hemi pelvis appears stable since 2007. 3. No acute osseous abnormality identified.  Original Report Authenticated By: Ulla Potash III, M.D.     1. Spinal stenosis of lumbar region   2. Low back pain  MDM  3:23 PM Patient's CT shows severe spinal stenosis at L3-L4 and L4-L5. This may be the cause of her pain. I went to explain this information to the patient and she clarified that she actually has not been able to weight bear since last night since she has felt as if her legs were about to give out. Given this information, she cannot safely be d/ced home at this point. Call placed to medicine for admission.  5:36 PM Patient admitted to [facility] team 5. Consultant has requested NSG consult, which I've placed.  Suspect that her pain and inability to ambulate may be related to her severe spinal stenosis. She will be admitted for further eval and tx.    Grant Fontana, [place] 08/29/11 1753

## 2011-08-29 NOTE — Progress Notes (Signed)
Spoke with Dr Venetia Maxon ortho who is willing to do surgery on patient (if she is agreeable). He wants an MRI LS spine and states she would need to be transferred to [facility] non-emergently for surgery if she desires.

## 2011-08-29 NOTE — ED Notes (Signed)
Report called to the Floor Nurse Sherell rn and in the middle of the report we had a code blue report was suspended and then called back to the the nurse

## 2011-08-29 NOTE — ED Notes (Signed)
ZOX:[id number]<AV> Expected date:08/29/11<BR> Expected time:10:08 AM<BR> Means of arrival:Ambulance<BR> Comments:<BR> EMS 51 GC - back pain

## 2011-08-30 LAB — COMPREHENSIVE METABOLIC PANEL
AST: 19 U/L (ref 0–37)
CO2: 25 mEq/L (ref 19–32)
Calcium: 9.7 mg/dL (ref 8.4–10.5)
Creatinine, Ser: 1.05 mg/dL (ref 0.50–1.10)
GFR calc non Af Amer: 46 mL/min — ABNORMAL LOW (ref >90)
Sodium: 131 mEq/L — ABNORMAL LOW (ref 135–145)
Total Protein: 6.9 g/dL (ref 6.0–8.3)

## 2011-08-30 LAB — CBC
HCT: 33 % — ABNORMAL LOW (ref 36.0–46.0)
Hemoglobin: 11.8 g/dL — ABNORMAL LOW (ref 12.0–15.0)
MCH: 32.9 pg (ref 26.0–34.0)
RBC: 3.59 MIL/uL — ABNORMAL LOW (ref 3.87–5.11)

## 2011-08-30 LAB — MAGNESIUM: Magnesium: 1.9 mg/dL (ref 1.5–2.5)

## 2011-08-30 NOTE — Progress Notes (Signed)
ot note:  Noted pt has neurosurgery consult pending; will check back after this.  Spoke to [employer].

## 2011-08-30 NOTE — Progress Notes (Signed)
[id number]/Bernadean Saling, RN, BSN, CCM/CHART REVIEW FOR UR PERFORMED. 

## 2011-08-30 NOTE — Progress Notes (Signed)
Noted that Neuro consult is pending. Will initiate PT after neuro consult.

## 2011-08-30 NOTE — Telephone Encounter (Signed)
Pt would like cindy to return her call [phone]

## 2011-08-30 NOTE — Progress Notes (Signed)
Reason for Consult:Lumbar spinal stenosis Referring Physician: Leiah Giannotti is an 77 y.o. female.  HPI: Mrs. Dowe has been having back and bilateral leg pain, worse on the right.  Her legs have given way and she is not able to stand for any length of time.  She denies bowel or bladder dysfunction or saddle anesthesia.  This problem is progressive and has been worsening.  Past Medical History  Diagnosis Date  . CARCINOID TUMOR 04/13/2007  . HYPERTENSION 04/13/2007  . ALLERGIC RHINITIS 04/13/2007  . GERD 04/13/2007  . HYPERGLYCEMIA 04/13/2007  . CHOLELITHIASIS 05/02/2010  . Arthritis   . Cancer   . Environmental allergies     has mucus in throat  . Diphtheria     age 89    Past Surgical History  Procedure Date  . Sbo surgery   . Tonsillectomy     many years ago    Family History  Problem Relation Age of Onset  . Hypertension Mother   . Uterine cancer Mother   . Ovarian cancer Mother   . Hypertension Father   . Stroke Father   . Hypertension Brother   . Hyperlipidemia Brother   . Heart attack Brother   . Lung cancer Sister     Social History:  reports that she has never smoked. She has never used smokeless tobacco. She reports that she does not drink alcohol or use illicit drugs.  Allergies: No Known Allergies  Medications: I have reviewed the patient's current medications.  Results for orders placed during the hospital encounter of 08/29/11 (from the past 48 hour(s))  URINALYSIS, ROUTINE W REFLEX MICROSCOPIC     Status: Abnormal   Collection Time   08/29/11 11:57 AM      Component Value Range Comment   Color, Urine YELLOW  YELLOW     Appearance CLOUDY (*) CLEAR     Specific Gravity, Urine 1.007  1.005 - 1.030     pH 5.5  5.0 - 8.0     Glucose, UA NEGATIVE  NEGATIVE (mg/dL)    Hgb urine dipstick NEGATIVE  NEGATIVE     Bilirubin Urine NEGATIVE  NEGATIVE     Ketones, ur NEGATIVE  NEGATIVE (mg/dL)    Protein, ur NEGATIVE  NEGATIVE (mg/dL)    Urobilinogen,  UA 0.2  0.0 - 1.0 (mg/dL)    Nitrite POSITIVE (*) NEGATIVE     Leukocytes, UA NEGATIVE  NEGATIVE    URINE MICROSCOPIC-ADD ON     Status: Abnormal   Collection Time   08/29/11 11:57 AM      Component Value Range Comment   Squamous Epithelial / LPF RARE  RARE     WBC, UA 0-2  <3 (WBC/hpf)    RBC / HPF 0-2  <3 (RBC/hpf)    Bacteria, UA FEW (*) RARE    URINE CULTURE     Status: Normal (Preliminary result)   Collection Time   08/29/11 11:57 AM      Component Value Range Comment   Specimen Description URINE, CATHETERIZED      Special Requests NONE      Setup Time [id number]      Colony Count >=100,000 COLONIES/ML      Culture GRAM NEGATIVE RODS      Report Status PENDING     CBC     Status: Abnormal   Collection Time   08/29/11  1:10 PM      Component Value Range Comment   WBC 5.2  4.0 -  10.5 (K/uL)    RBC 3.93  3.87 - 5.11 (MIL/uL)    Hemoglobin 12.5  12.0 - 15.0 (g/dL)    HCT 40.9 (*) 81.1 - 46.0 (%)    MCV 91.1  78.0 - 100.0 (fL)    MCH 31.8  26.0 - 34.0 (pg)    MCHC 34.9  30.0 - 36.0 (g/dL)    RDW 91.4  78.2 - 95.6 (%)    Platelets 402 (*) 150 - 400 (K/uL)   DIFFERENTIAL     Status: Normal   Collection Time   08/29/11  1:10 PM      Component Value Range Comment   Neutrophils Relative 66  43 - 77 (%)    Neutro Abs 3.4  1.7 - 7.7 (K/uL)    Lymphocytes Relative 22  12 - 46 (%)    Lymphs Abs 1.1  0.7 - 4.0 (K/uL)    Monocytes Relative 11  3 - 12 (%)    Monocytes Absolute 0.6  0.1 - 1.0 (K/uL)    Eosinophils Relative 1  0 - 5 (%)    Eosinophils Absolute 0.1  0.0 - 0.7 (K/uL)    Basophils Relative 0  0 - 1 (%)    Basophils Absolute 0.0  0.0 - 0.1 (K/uL)   POCT I-STAT, CHEM 8     Status: Abnormal   Collection Time   08/29/11  1:21 PM      Component Value Range Comment   Sodium 134 (*) 135 - 145 (mEq/L)    Potassium 4.3  3.5 - 5.1 (mEq/L)    Chloride 101  96 - 112 (mEq/L)    BUN 17  6 - 23 (mg/dL)    Creatinine, Ser 2.13  0.50 - 1.10 (mg/dL)    Glucose, Bld 086 (*) 70 - 99  (mg/dL)    Calcium, Ion 5.78  1.12 - 1.32 (mmol/L)    TCO2 24  0 - 100 (mmol/L)    Hemoglobin 13.6  12.0 - 15.0 (g/dL)    HCT 46.9  62.9 - 52.8 (%)   MAGNESIUM     Status: Normal   Collection Time   08/30/11  4:25 AM      Component Value Range Comment   Magnesium 1.9  1.5 - 2.5 (mg/dL)   COMPREHENSIVE METABOLIC PANEL     Status: Abnormal   Collection Time   08/30/11  4:25 AM      Component Value Range Comment   Sodium 131 (*) 135 - 145 (mEq/L)    Potassium 4.3  3.5 - 5.1 (mEq/L)    Chloride 97  96 - 112 (mEq/L)    CO2 25  19 - 32 (mEq/L)    Glucose, Bld 101 (*) 70 - 99 (mg/dL)    BUN 14  6 - 23 (mg/dL)    Creatinine, Ser 4.13  0.50 - 1.10 (mg/dL)    Calcium 9.7  8.4 - 10.5 (mg/dL)    Total Protein 6.9  6.0 - 8.3 (g/dL)    Albumin 3.2 (*) 3.5 - 5.2 (g/dL)    AST 19  0 - 37 (U/L)    ALT 12  0 - 35 (U/L)    Alkaline Phosphatase 114  39 - 117 (U/L)    Total Bilirubin 0.4  0.3 - 1.2 (mg/dL)    GFR calc non Af Amer 46 (*) >90 (mL/min)    GFR calc Af Amer 53 (*) >90 (mL/min)   CBC     Status: Abnormal   Collection Time  08/30/11  4:25 AM      Component Value Range Comment   WBC 4.9  4.0 - 10.5 (K/uL)    RBC 3.59 (*) 3.87 - 5.11 (MIL/uL)    Hemoglobin 11.8 (*) 12.0 - 15.0 (g/dL)    HCT 16.1 (*) 09.6 - 46.0 (%)    MCV 91.9  78.0 - 100.0 (fL)    MCH 32.9  26.0 - 34.0 (pg)    MCHC 35.8  30.0 - 36.0 (g/dL)    RDW 04.5  40.9 - 81.1 (%)    Platelets 395  150 - 400 (K/uL)     Dg Lumbar Spine 2-3 Views  08/29/2011  *RADIOLOGY REPORT*  Clinical Data: Severe low back pain.  LUMBAR SPINE - 2-3 VIEW 08/29/2011:  Comparison: Lumbar spine MRI 07/26/2010 [facility] [address].  Lumbar spine x-rays 02/05/2010 [facility].  Findings: Lateral image was obtained using cross-table lateral technique.  Five non-rib bearing lumbar vertebrae with anatomic alignment.  No fractures.  Straightening of the usual lumbar lordosis.  Mild disc space narrowing and endplate hypertrophic changes  at L2-3, L3-4, L4-5, and L5-S1, unchanged.  Thoracolumbar scoliosis convex left, unchanged.  Posterior element hypertrophy throughout the lumbar spine.  Visualized sacroiliac joints intact with degenerative changes.  Degenerative changes also noted in both hips.  IMPRESSION: No acute osseous abnormality.  Straightening of the usual lumbar lordosis which may reflect positioning and/or spasm.  Diffuse degenerative disc disease, spondylosis, and facet degenerative changes.  Scoliosis.  Degenerative changes involving the visualized sacroiliac joints and hips.  Original Report Authenticated By: Arnell Sieving, M.D.   Ct Lumbar Spine Wo Contrast  08/29/2011  *RADIOLOGY REPORT*  Clinical Data: 77 year old female with back pain, bilateral leg pain.  Remote history of colon cancer.  CT LUMBAR SPINE WITHOUT CONTRAST  Technique:  Multidetector CT imaging of the lumbar spine was performed without intravenous contrast administration. Multiplanar CT image reconstructions were also generated.  Comparison: Lumbar radiographs 08/29/2011.  MRI 07/26/2010. CT abdomen and pelvis 07/02/2006.  Findings: Negative visualized abdominal viscera.  Osteopenia.  Normal lumbar segmentation.  Chronic changes to the medial right iliac bone partially visible, likely Paget's disease and stable since 2007. Visualized sacrum appears intact.  Negative SI joints.  Stable lumbar vertebral height and alignment.  Mild scoliosis. No acute osseous abnormality identified.  T12-L1:  Chronic circumferential disc osteophyte complex.  No significant spinal stenosis.  L1-L2:  Chronic partially calcified circumferential disc bulge.  No significant stenosis.  L2-L3:  Chronic advanced disc degeneration with bulky right far lateral disc osteophyte complex.  Moderate facet degeneration. Mild spinal, right lateral recess, right foraminal stenosis.  L3-L4:  Chronic circumferential disc bulge.  Severe facet and ligament flavum hypertrophy.  Severe spinal stenosis  and moderate right foraminal stenosis may have increased.  L4-L5:  Chronic circumferential disc osteophyte complex.  Severe ligament flavum and facet hypertrophy. Chronic severe spinal stenosis and mild left foraminal stenosis.  L5-S1:  Chronic very severe facet hypertrophy on the left.  Chronic bulky left far lateral disc osteophyte complex.  Chronic severe left lateral recess stenosis and left foraminal stenosis.  IMPRESSION: 1.  Chronic severe lumbar degenerative changes including severe spinal stenosis at L3-L4 and L4-L5, with stenosis at the former appearing somewhat progressed since 2011. 2.  Chronic Paget's disease of the right hemi pelvis appears stable since 2007. 3. No acute osseous abnormality identified.  Original Report Authenticated By: Ulla Potash III, M.D.    @ROS @ Blood pressure 169/74, pulse 80, temperature 97.6  F (36.4 C), temperature source Oral, resp. rate 16, height 5\' 7"  (1.702 m), weight 73.483 kg (162 lb), SpO2 97.00%. On exam, patient has good strength in both upper and lower extremities. She has no deformities over her spine. She denies significant numbness in her legs.  Her reflexes are diminished in both upper and lower extremities.  Assessment/Plan: 77 yo woman with critical stenosis at L4/5 and severe stenosis at L3/4. I think it is unlikely she will improve significantly with injections, but am not opposed to trying an ESI.  This can be done at [facility] by radiology.  If she wishes to pursue surgery, I would recommend an MRI of her lumbar spine and transfer to [facility] for decompressive laminectomy for stenosis.  She wishes to discuss her options with her niece.  No need for MRI unless she decides to go ahead with surgery.  In the meantime, Rx UTI and initiate PT. Please let me know if she wishes to proceed with surgery.  MRI would be noncontrast MRI LS spine.  Dorian Heckle, MD 08/30/2011, 6:05 PM

## 2011-08-30 NOTE — Clinical Documentation Improvement (Signed)
Hypertension Documentation Clarification Query  Dear Dr.  Marton Redwood  In an effort to better capture your patient's severity of illness, reflect appropriate length of stay and utilization of resources, a review of the patient medical record has revealed the following indicators.     Pt admitted with low back pain 2/2 to lumbar stenosis.   According to H/P pt with HTN. B/Ps for this admission range from 183/71-164/70 necessitating the treatment of  hydralazine, and HCTZ.    Please clarify whether or not HTN can be further specified as one of the diagnoses listed below and  document in pn or d/c summary.  Malignant HTN  Accelerated HTN  Portal HTN  " Or Other Condition __________________________  " Cannot Clinically Determine   Supporting Information:  Risk Factors: Spinal stenosis of lumbar; HTN; Gerd; UTI  Diagnostics: 08/29/11:Blood Pressure 164/70  167/75  164/74  183/71  170/56     Treatments:      aspirin EC tablet 81 mg       enoxaparin (LOVENOX) injection 40 mg       hydrALAZINE (APRESOLINE) injection 10 mg       olmesartan (BENICAR) tablet 20 mg       hydrochlorothiazide (MICROZIDE) capsule 12.5 mg       olmesartan (BENICAR) tablet 20 mg       hydrochlorothiazide (MICROZIDE) capsule 12.5 mg     You may use possible, probable, or suspect with inpatient documentation. Possible, probable, suspected diagnoses MUST be documented at the time of discharge. Based on your clinical judgment, please clarify and document in a progress note and/or discharge summary the clinical condition associated with the following supporting information:  In responding to this query please exercise your independent judgment.  The fact that a query is asked, does not imply that any particular answer is desired or expected.  Reviewed:  no additional documentation provided --Cant further classify as patient hadnt taken her BP medications.   Thank You,  Sincerely, Enis Slipper   Clinical Documentation Specialist:  Pager  Health Information Management [place]

## 2011-08-30 NOTE — Telephone Encounter (Signed)
Pt was admitted to the hospital and she said Dr Janee Morn was going to do an MRI and told her she should have back surgery.  Pt does not want surgery.  Pt stated that she thought Dr Cato Mulligan was going to meet her in the ER when she called and told us she was going.  Explained to that we had hospitalist to do our admissions and she also should consider every option before ruling out surgery.  Pt agreed

## 2011-08-30 NOTE — Progress Notes (Signed)
Subjective: Pt states some improvement with low back pain/ hip pain. Pt hasnt tried to ambulate today. Objective: Vital signs in last 24 hours: Filed Vitals:   08/29/11 1601 08/29/11 2117 08/29/11 2140 08/30/11 0615  BP: 183/71 164/74 167/75 164/70  Pulse: 90 80 83 71  Temp: 98 F (36.7 C) 97.8 F (36.6 C) 98.3 F (36.8 C) 97.5 F (36.4 C)  TempSrc: Oral Oral Oral Oral  Resp: 18 16 18 12   Height:   5\' 7"  (1.702 m)   Weight:   73.483 kg (162 lb)   SpO2: 100% 98% 100% 94%    Intake/Output Summary (Last 24 hours) at 08/30/11 0933 Last data filed at 08/30/11 0735  Gross per 24 hour  Intake 617.26 ml  Output    830 ml  Net -212.74 ml    Weight change:   General: Alert, awake, oriented x3, in no acute distress. Heart: Regular rate and rhythm, without murmurs, rubs, gallops. Lungs: Clear to auscultation bilaterally. Abdomen: Soft, nontender, nondistended, positive bowel sounds. Extremities: No clubbing cyanosis or edema with positive pedal pulses. Neuro: Grossly intact, nonfocal.  Lab Results:  Basename 08/30/11 0425 08/29/11 1321  NA 131* 134*  K 4.3 4.3  CL 97 101  CO2 25 --  GLUCOSE 101* 107*  BUN 14 17  CREATININE 1.05 1.10  CALCIUM 9.7 --  MG 1.9 --  PHOS -- --    Basename 08/30/11 0425  AST 19  ALT 12  ALKPHOS 114  BILITOT 0.4  PROT 6.9  ALBUMIN 3.2*   No results found for this basename: LIPASE:2,AMYLASE:2 in the last 72 hours  Basename 08/30/11 0425 08/29/11 1321 08/29/11 1310  WBC 4.9 -- 5.2  NEUTROABS -- -- 3.4  HGB 11.8* 13.6 --  HCT 33.0* 40.0 --  MCV 91.9 -- 91.1  PLT 395 -- 402*   No results found for this basename: CKTOTAL:3,CKMB:3,CKMBINDEX:3,TROPONINI:3 in the last 72 hours No results found for this basename: POCBNP:3 in the last 72 hours No results found for this basename: DDIMER:2 in the last 72 hours No results found for this basename: HGBA1C:2 in the last 72 hours No results found for this basename:  CHOL:2,HDL:2,LDLCALC:2,TRIG:2,CHOLHDL:2,LDLDIRECT:2 in the last 72 hours No results found for this basename: TSH,T4TOTAL,FREET3,T3FREE,THYROIDAB in the last 72 hours No results found for this basename: VITAMINB12:2,FOLATE:2,FERRITIN:2,TIBC:2,IRON:2,RETICCTPCT:2 in the last 72 hours  Micro Results: No results found for this or any previous visit (from the past 240 hour(s)).  Studies/Results: Dg Lumbar Spine 2-3 Views  08/29/2011  *RADIOLOGY REPORT*  Clinical Data: Severe low back pain.  LUMBAR SPINE - 2-3 VIEW 08/29/2011:  Comparison: Lumbar spine MRI 07/26/2010 [facility] [address]  Lumbar spine x-rays 02/05/2010 [facility].  Findings: Lateral image was obtained using cross-table lateral technique.  Five non-rib bearing lumbar vertebrae with anatomic alignment.  No fractures.  Straightening of the usual lumbar lordosis.  Mild disc space narrowing and endplate hypertrophic changes at L2-3, L3-4, L4-5, and L5-S1, unchanged.  Thoracolumbar scoliosis convex left, unchanged.  Posterior element hypertrophy throughout the lumbar spine.  Visualized sacroiliac joints intact with degenerative changes.  Degenerative changes also noted in both hips.  IMPRESSION: No acute osseous abnormality.  Straightening of the usual lumbar lordosis which may reflect positioning and/or spasm.  Diffuse degenerative disc disease, spondylosis, and facet degenerative changes.  Scoliosis.  Degenerative changes involving the visualized sacroiliac joints and hips.  Original Report Authenticated By: Arnell Sieving, M.D.   Ct Lumbar Spine Wo Contrast  08/29/2011  *RADIOLOGY REPORT*  Clinical Data: 77 year old female with back pain, bilateral leg pain.  Remote history of colon cancer.  CT LUMBAR SPINE WITHOUT CONTRAST  Technique:  Multidetector CT imaging of the lumbar spine was performed without intravenous contrast administration. Multiplanar CT image reconstructions were also generated.  Comparison: Lumbar  radiographs 08/29/2011.  MRI 07/26/2010. CT abdomen and pelvis 07/02/2006.  Findings: Negative visualized abdominal viscera.  Osteopenia.  Normal lumbar segmentation.  Chronic changes to the medial right iliac bone partially visible, likely Paget's disease and stable since 2007. Visualized sacrum appears intact.  Negative SI joints.  Stable lumbar vertebral height and alignment.  Mild scoliosis. No acute osseous abnormality identified.  T12-L1:  Chronic circumferential disc osteophyte complex.  No significant spinal stenosis.  L1-L2:  Chronic partially calcified circumferential disc bulge.  No significant stenosis.  L2-L3:  Chronic advanced disc degeneration with bulky right far lateral disc osteophyte complex.  Moderate facet degeneration. Mild spinal, right lateral recess, right foraminal stenosis.  L3-L4:  Chronic circumferential disc bulge.  Severe facet and ligament flavum hypertrophy.  Severe spinal stenosis and moderate right foraminal stenosis may have increased.  L4-L5:  Chronic circumferential disc osteophyte complex.  Severe ligament flavum and facet hypertrophy. Chronic severe spinal stenosis and mild left foraminal stenosis.  L5-S1:  Chronic very severe facet hypertrophy on the left.  Chronic bulky left far lateral disc osteophyte complex.  Chronic severe left lateral recess stenosis and left foraminal stenosis.  IMPRESSION: 1.  Chronic severe lumbar degenerative changes including severe spinal stenosis at L3-L4 and L4-L5, with stenosis at the former appearing somewhat progressed since 2011. 2.  Chronic Paget's disease of the right hemi pelvis appears stable since 2007. 3. No acute osseous abnormality identified.  Original Report Authenticated By: Harley Hallmark, M.D.    Medications:     . aspirin EC  81 mg Oral Daily  . benzonatate  100 mg Oral TID  . cefTRIAXone (ROCEPHIN) IV  1 g Intravenous Q24H  . docusate sodium  100 mg Oral BID  . enoxaparin  40 mg Subcutaneous Q24H  . ferrous sulfate   325 mg Oral Q breakfast  . hydrochlorothiazide  12.5 mg Oral Once  . hydrochlorothiazide  12.5 mg Oral Daily  . loratadine  10 mg Oral Daily  .  morphine injection  4 mg Intramuscular Once  . multivitamins ther. w/minerals  1 tablet Oral Daily  . olmesartan  20 mg Oral Once  . olmesartan  20 mg Oral Daily  . ondansetron  8 mg Oral Once  . oxyCODONE-acetaminophen  1 tablet Oral Once  . pantoprazole  40 mg Oral Q1200  . potassium chloride  10 mEq Oral Daily  . DISCONTD: valsartan-hydrochlorothiazide  1 tablet Oral Daily    AssessmentPlan Principal Problem:  *Low back pain Active Problems:  Spinal stenosis of lumbar region at multiple levels  UTI (lower urinary tract infection)   1. Low back pain- likely secondary to severe spinal stenosis, which has progressed since 2011 per CT scan. NeuroSurgery consult is pending. Ashok Pall, Dr Haroldine Laws spoke with Dr Venetia Maxon last night who recommended MRI of LSpine, and if patient wanted surgery will need to be transferred to [facility]. Patient states wants to speak with neurosurgeon prior to MRI. Will d/c MRI for now until patient seen by neurosurgery consult. PT /OT. Pain management. Follow.  2. UTI - urine cultures are pending. Place on IV Rocephin.  3. HTN- resume home regimen. Hydralazine PRN.  4. Allergic rhinitis - continue home regimen of Zyrtec.  5. GERD - PPI.  6. prophylaxis - PPI for GI, Lovenox for DVT.     LOS: 1 day   [facility] 08/30/2011, 9:33 AM

## 2011-08-31 LAB — BASIC METABOLIC PANEL
BUN: 13 mg/dL (ref 6–23)
CO2: 25 mEq/L (ref 19–32)
Chloride: 97 mEq/L (ref 96–112)
Creatinine, Ser: 0.97 mg/dL (ref 0.50–1.10)

## 2011-08-31 LAB — CBC
HCT: 33.7 % — ABNORMAL LOW (ref 36.0–46.0)
MCH: 31.4 pg (ref 26.0–34.0)
MCV: 91.3 fL (ref 78.0–100.0)
RBC: 3.69 MIL/uL — ABNORMAL LOW (ref 3.87–5.11)
WBC: 5.8 10*3/uL (ref 4.0–10.5)

## 2011-08-31 LAB — URINE CULTURE

## 2011-08-31 MED ORDER — SODIUM CHLORIDE 0.9 % IJ SOLN
3.0000 mL | Freq: Two times a day (BID) | INTRAMUSCULAR | Status: DC
  Administered 2011-08-31 – 2011-09-02 (×2): 3 mL via INTRAVENOUS

## 2011-08-31 MED ORDER — POLYETHYLENE GLYCOL 3350 17 G PO PACK
17.0000 g | PACK | Freq: Every day | ORAL | Status: DC | PRN
  Administered 2011-09-01 – 2011-09-02 (×2): 17 g via ORAL
  Filled 2011-08-31 (×2): qty 1

## 2011-08-31 NOTE — Progress Notes (Signed)
Physical Therapy Evaluation Patient Details Name: Becky Morales MRN: [id number] DOB: 02/02/1920 Today's Date: 08/31/2011 Time: 4098-1191 Charge: EVII  Problem List:  Patient Active Problem List  Diagnoses  . CARCINOID TUMOR  . HYPERTENSION  . ALLERGIC RHINITIS  . GERD  . CHOLELITHIASIS  . HYPERGLYCEMIA  . Fatigue  . Myalgia  . Low back pain  . Spinal stenosis of lumbar region at multiple levels  . UTI (lower urinary tract infection)    Past Medical History:  Past Medical History  Diagnosis Date  . CARCINOID TUMOR 04/13/2007  . HYPERTENSION 04/13/2007  . ALLERGIC RHINITIS 04/13/2007  . GERD 04/13/2007  . HYPERGLYCEMIA 04/13/2007  . CHOLELITHIASIS 05/02/2010  . Arthritis   . Cancer   . Environmental allergies     has mucus in throat  . Diphtheria     age 77   Past Surgical History:  Past Surgical History  Procedure Date  . Sbo surgery   . Tonsillectomy     many years ago    PT Assessment/Plan/Recommendation PT Assessment Clinical Impression Statement: Pt presents with increased low back pain limiting ambulation distance as well as decreased LE strength.  Pt would benefit from acute PT in order to improve ambulation distance with LRAD, perform stairs, and increase LE strength prior to D/C home with family/friends to assist as needed. PT Recommendation/Assessment: Patient will need skilled PT in the acute care venue PT Problem List: Decreased strength;Decreased mobility;Decreased knowledge of use of DME;Pain PT Therapy Diagnosis : Difficulty walking;Generalized weakness;Acute pain PT Plan PT Frequency:  (2-3 visits) PT Treatment/Interventions: DME instruction;Gait training;Stair training;Functional mobility training;Therapeutic exercise;Patient/family education PT Recommendation Recommendations for Other Services:  (Co-eval with OT) Follow Up Recommendations: None Equipment Recommended: Rolling walker with 5" wheels PT Goals  Acute Rehab PT Goals PT Goal Formulation:  With patient Time For Goal Achievement: 3 days Pt will Roll Supine to Right Side: with modified independence PT Goal: Rolling Supine to Right Side - Progress: Progressing toward goal Pt will go Supine/Side to Sit: with modified independence PT Goal: Supine/Side to Sit - Progress: Progressing toward goal Pt will Transfer Sit to Stand/Stand to Sit: with modified independence PT Transfer Goal: Sit to Stand/Stand to Sit - Progress: Progressing toward goal Pt will Ambulate: 51 - 150 feet;with modified independence;with least restrictive assistive device PT Goal: Ambulate - Progress: Progressing toward goal Pt will Go Up / Down Stairs: 3-5 stairs;with supervision;with rail(s) (4 stairs with 2 rails) PT Goal: Up/Down Stairs - Progress: Not met Pt will Perform Home Exercise Program: with supervision, verbal cues required/provided (with handout) PT Goal: Perform Home Exercise Program - Progress: Not met  PT Evaluation Precautions/Restrictions  Precautions Precautions: Fall Precaution Comments: fall risk 2* reports legs buckle when feeling weak Required Braces or Orthoses: No Restrictions Weight Bearing Restrictions: No Prior Functioning  Home Living Lives With: Alone Receives Help From: Family (cousin helps with cleaning and laundry 2x/wk) Type of Home: House Home Layout: One level Home Access: Stairs to enter Entrance Stairs-Rails: Can reach both;Right;Left Entrance Stairs-Number of Steps: 4 Bathroom Shower/Tub: Engineer, manufacturing systems:  (has vanity next to commode on left) Home Adaptive Equipment: Straight cane;Shower chair with back Prior Function Level of Independence: Requires assistive device for independence (cousin helps with some cleaning. Requires cane for mobility) Driving: Yes Cognition Cognition Arousal/Alertness: Awake/alert Overall Cognitive Status: Appears within functional limits for tasks assessed Sensation/Coordination Sensation Light Touch: Impaired by  gross assessment (reports numbness/tingling in Bilateral plantar feet) Extremity Assessment RUE Assessment RUE Assessment:  Within Functional Limits LUE Assessment LUE Assessment: Within Functional Limits RLE AROM (degrees) RLE Overall AROM Comments: grossly WFL RLE Strength RLE Overall Strength Comments: grossly 4-/5 except hip flexion and knee flexion 3+/5 LLE AROM (degrees) LLE Overall AROM Comments: grossly WFL LLE Strength LLE Overall Strength Comments: grossly 4-/5 except hip flexion and knee flexion 3+/5 Mobility (including Balance) Bed Mobility Bed Mobility: Yes Rolling Right: 5: Supervision Rolling Right Details (indicate cue type and reason): verbal cues to complete sidelying Right Sidelying to Sit: 5: Supervision Right Sidelying to Sit Details (indicate cue type and reason): HOB elevated.  Pt required use of rail to assist with rise. Transfers Transfers: Yes Sit to Stand: 4: Min assist;With upper extremity assist;From bed Sit to Stand Details (indicate cue type and reason): min/guard.  verbal cues for hand placement Stand to Sit: 4: Min assist;With upper extremity assist;To chair/3-in-1 Stand to Sit Details: min/guard.  verbal cues to use armrests of recliner Ambulation/Gait Ambulation/Gait: Yes Ambulation/Gait Assistance: 4: Min assist Ambulation/Gait Assistance Details (indicate cue type and reason): min/guard.  verbal cues for increasing trunk extension per pain tolerance Ambulation Distance (Feet): 60 Feet Assistive device: Rolling walker Gait Pattern:  (increased trunk flexion) Gait velocity: slow cadence Stairs: No  Balance Balance Assessed: No (NT 2* back pain) Exercise    End of Session PT - End of Session Activity Tolerance: Patient tolerated treatment well Patient left: in chair;with call bell in reach;with family/visitor present General Behavior During Session: [facility] for tasks performed Cognition: [facility] for tasks performed  [facility] 08/31/2011,  4:27 PM

## 2011-08-31 NOTE — Progress Notes (Signed)
Occupational Therapy Evaluation Patient Details Name: Becky Morales MRN: [id number] DOB: 01-24-1920 Today's Date: 08/31/2011 Time in: 14:38 Time out: 15:12 Cotx with PT  Problem List:  Patient Active Problem List  Diagnoses  . CARCINOID TUMOR  . HYPERTENSION  . ALLERGIC RHINITIS  . GERD  . CHOLELITHIASIS  . HYPERGLYCEMIA  . Fatigue  . Myalgia  . Low back pain  . Spinal stenosis of lumbar region at multiple levels  . UTI (lower urinary tract infection)    Past Medical History:  Past Medical History  Diagnosis Date  . CARCINOID TUMOR 04/13/2007  . HYPERTENSION 04/13/2007  . ALLERGIC RHINITIS 04/13/2007  . GERD 04/13/2007  . HYPERGLYCEMIA 04/13/2007  . CHOLELITHIASIS 05/02/2010  . Arthritis   . Cancer   . Environmental allergies     has mucus in throat  . Diphtheria     age 77   Past Surgical History:  Past Surgical History  Procedure Date  . Sbo surgery   . Tonsillectomy     many years ago    OT Assessment/Plan/Recommendation OT Assessment Clinical Impression Statement: Patient will benefit from skilled OT in the acute setting to facilitate return to modified independence level. OT Recommendation/Assessment: Patient will need skilled OT in the acute care venue OT Problem List: Decreased strength;Decreased activity tolerance;Decreased knowledge of use of DME or AE;Pain OT Therapy Diagnosis : Generalized weakness;Acute pain OT Plan OT Frequency: Min 2X/week OT Treatment/Interventions: Self-care/ADL training;Therapeutic activities;DME and/or AE instruction OT Recommendation Follow Up Recommendations: None Equipment Recommended: 3 in 1 bedside comode (possibly a 3 in 1. will further assess ) Individuals Consulted Consulted and Agree with Results and Recommendations: Patient OT Goals Acute Rehab OT Goals OT Goal Formulation: With patient Time For Goal Achievement: 2 weeks ADL Goals Pt Will Perform Grooming: with modified independence;Standing at sink ADL Goal:  Grooming - Progress: Progressing toward goals Pt Will Perform Upper Body Bathing: with modified independence;Sitting, chair;Sitting, edge of bed ADL Goal: Upper Body Bathing - Progress: Progressing toward goals Pt Will Perform Lower Body Bathing: with modified independence;Sit to stand from chair;Sit to stand from bed ADL Goal: Lower Body Bathing - Progress: Progressing toward goals Pt Will Perform Upper Body Dressing: with modified independence;Sitting, chair;Sitting, bed ADL Goal: Upper Body Dressing - Progress: Progressing toward goals Pt Will Perform Lower Body Dressing: with modified independence;Sit to stand from chair;Sit to stand from bed ADL Goal: Lower Body Dressing - Progress: Progressing toward goals Pt Will Transfer to Toilet: with modified independence;3-in-1;Ambulation;with DME ADL Goal: Toilet Transfer - Progress: Progressing toward goals Pt Will Perform Toileting - Clothing Manipulation: with modified independence;Sitting on 3-in-1 or toilet;Standing ADL Goal: Toileting - Clothing Manipulation - Progress: Progressing toward goals Pt Will Perform Toileting - Hygiene: with modified independence;Sit to stand from 3-in-1/toilet ADL Goal: Toileting - Hygiene - Progress: Progressing toward goals Pt Will Perform Tub/Shower Transfer: with modified independence;Shower seat with back ADL Goal: Web designer - Progress: Progressing toward goals  OT Evaluation Precautions/Restrictions  Precautions Precautions:  (call for assist) Required Braces or Orthoses: No Restrictions Weight Bearing Restrictions: No Prior Functioning Home Living Lives With: Alone Receives Help From: Family (cousin helps with cleaning and laundry 2x/wk) Type of Home: House Home Layout: One level Home Access: Stairs to enter Entrance Stairs-Rails: Can reach both;Right;Left Entrance Stairs-Number of Steps: 4 Bathroom Shower/Tub: Engineer, manufacturing systems:  (has vanity next to commode on  left) Home Adaptive Equipment: Straight cane;Shower chair with back Prior Function Level of Independence: Requires assistive device for independence (  cousin helps with some cleaning. Requires cane for mobility) Driving: Yes ADL ADL Eating/Feeding: Simulated;Independent Where Assessed - Eating/Feeding: Bed level Grooming: Performed;Minimal assistance;Wash/dry hands Grooming Details (indicate cue type and reason): min guard assist Where Assessed - Grooming: Standing at sink Upper Body Bathing: Simulated;Set up Where Assessed - Upper Body Bathing: Unsupported;Sitting, bed Lower Body Bathing: Simulated;Minimal assistance Lower Body Bathing Details (indicate cue type and reason): min guard assist Where Assessed - Lower Body Bathing: Sit to stand from bed Upper Body Dressing: Simulated;Set up Where Assessed - Upper Body Dressing: Sitting, bed;Unsupported Lower Body Dressing: Minimal assistance Lower Body Dressing Details (indicate cue type and reason): min guard assist Where Assessed - Lower Body Dressing: Sit to stand from bed Toilet Transfer: Performed;Minimal assistance Toilet Transfer Details (indicate cue type and reason): min guard assist Toilet Transfer Method: Ambulating (with rolling walker) Toilet Transfer Equipment: Comfort height toilet;Grab bars Toileting - Clothing Manipulation: Simulated;Minimal assistance Toileting - Clothing Manipulation Details (indicate cue type and reason): min guard assist Where Assessed - Toileting Clothing Manipulation: Sit to stand from 3-in-1 or toilet Toileting - Hygiene: Performed;Minimal assistance Toileting - Hygiene Details (indicate cue type and reason): min guard assist Where Assessed - Toileting Hygiene: Sit to stand from 3-in-1 or toilet Tub/Shower Transfer: Not assessed Tub/Shower Transfer Method: Not assessed Equipment Used: Rolling walker Vision/Perception  Vision - History Baseline Vision: No visual  deficits Cognition Cognition Arousal/Alertness: Awake/alert Overall Cognitive Status:  (overall WFL. Some inconsistencies with using pain scale) Sensation/Coordination Sensation Light Touch: Impaired by gross assessment (states some numness right hand due to carpal tunnel) Extremity Assessment RUE Assessment RUE Assessment: Within Functional Limits LUE Assessment LUE Assessment: Within Functional Limits Mobility  Transfers Transfers: Yes Exercises   End of Session OT - End of Session Equipment Utilized During Treatment:  (rolling walker) Activity Tolerance: Patient tolerated treatment well Patient left: in chair;with call bell in reach General Behavior During Session: [facility] for tasks performed Cognition: [facility] for tasks performed   Lennox Laity Pager [phone] 08/31/2011, 3:50 PM

## 2011-08-31 NOTE — Progress Notes (Signed)
Subjective: Pain controlled. Patient states hasn't had a bowel movement since admission.  Objective: Vital signs in last 24 hours: Filed Vitals:   08/30/11 0615 08/30/11 1349 08/30/11 2149 08/31/11 0523  BP: 164/70 169/74 164/71 156/78  Pulse: 71 80 82 86  Temp: 97.5 F (36.4 C) 97.6 F (36.4 C) 98.1 F (36.7 C) 97.9 F (36.6 C)  TempSrc: Oral Oral Oral Oral  Resp: 12 16 16 18   Height:      Weight:      SpO2: 94% 97% 99% 100%    Intake/Output Summary (Last 24 hours) at 08/31/11 1348 Last data filed at 08/31/11 0800  Gross per 24 hour  Intake 2702.5 ml  Output    750 ml  Net 1952.5 ml    Weight change:      . aspirin EC  81 mg Oral Daily  . benzonatate  100 mg Oral TID  . cefTRIAXone (ROCEPHIN) IV  1 g Intravenous Q24H  . docusate sodium  100 mg Oral BID  . enoxaparin  40 mg Subcutaneous Q24H  . ferrous sulfate  325 mg Oral Q breakfast  . hydrochlorothiazide  12.5 mg Oral Once  . hydrochlorothiazide  12.5 mg Oral Daily  . loratadine  10 mg Oral Daily  . multivitamins ther. w/minerals  1 tablet Oral Daily  . olmesartan  20 mg Oral Once  . olmesartan  20 mg Oral Daily  . pantoprazole  40 mg Oral Q1200  . potassium chloride  10 mEq Oral Daily    Lab Results:  [facility] 08/31/11 0429 08/30/11 0425  NA 132* 131*  K 3.8 4.3  CL 97 97  CO2 25 25  GLUCOSE 113* 101*  BUN 13 14  CREATININE 0.97 1.05  CALCIUM 9.4 9.7  MG -- 1.9  PHOS -- --    Basename 08/30/11 0425  AST 19  ALT 12  ALKPHOS 114  BILITOT 0.4  PROT 6.9  ALBUMIN 3.2*   No results found for this basename: LIPASE:2,AMYLASE:2 in the last 72 hours  Basename 08/31/11 0429 08/30/11 0425 08/29/11 1310  WBC 5.8 4.9 --  NEUTROABS -- -- 3.4  HGB 11.6* 11.8* --  HCT 33.7* 33.0* --  MCV 91.3 91.9 --  PLT 382 395 --   No results found for this basename: CKTOTAL:3,CKMB:3,CKMBINDEX:3,TROPONINI:3 in the last 72 hours No results found for this basename: POCBNP:3 in the last 72 hours No results found  for this basename: DDIMER:2 in the last 72 hours No results found for this basename: HGBA1C:2 in the last 72 hours No results found for this basename: CHOL:2,HDL:2,LDLCALC:2,TRIG:2,CHOLHDL:2,LDLDIRECT:2 in the last 72 hours No results found for this basename: TSH,T4TOTAL,FREET3,T3FREE,THYROIDAB in the last 72 hours No results found for this basename: VITAMINB12:2,FOLATE:2,FERRITIN:2,TIBC:2,IRON:2,RETICCTPCT:2 in the last 72 hours  Micro Results: Recent Results (from the past 240 hour(s))  URINE CULTURE     Status: Normal   Collection Time   08/29/11 11:57 AM      Component Value Range Status Comment   Specimen Description URINE, CATHETERIZED   Final    Special Requests NONE   Final    Setup Time [id number]   Final    Colony Count >=100,000 COLONIES/ML   Final    Culture ESCHERICHIA COLI   Final    Report Status 08/31/2011 FINAL   Final    Organism ID, Bacteria ESCHERICHIA COLI   Final     Studies/Results: No results found.  Medications:     . aspirin EC  81 mg Oral Daily  .  benzonatate  100 mg Oral TID  . cefTRIAXone (ROCEPHIN) IV  1 g Intravenous Q24H  . docusate sodium  100 mg Oral BID  . enoxaparin  40 mg Subcutaneous Q24H  . ferrous sulfate  325 mg Oral Q breakfast  . hydrochlorothiazide  12.5 mg Oral Once  . hydrochlorothiazide  12.5 mg Oral Daily  . loratadine  10 mg Oral Daily  . multivitamins ther. w/minerals  1 tablet Oral Daily  . olmesartan  20 mg Oral Once  . olmesartan  20 mg Oral Daily  . pantoprazole  40 mg Oral Q1200  . potassium chloride  10 mEq Oral Daily    Assessment/Plan Principal Problem:  *Low back pain Active Problems:  Spinal stenosis of lumbar region at multiple levels  UTI (lower urinary tract infection)  1. Low back pain- likely secondary to severe spinal stenosis, which has progressed since 2011 per CT scan. NeuroSurgery has seen the patient and recommend surgery if patient wishes or may consider steriod injection which may not have  significant improving. Per NS MRI only if patient wishes surgery. Patient wishes to discuss with her niece.  PT /OT. Pain management. Follow.  2. E.Coli UTI - Rocephin D3/7 3. HTN- resume home regimen. Hydralazine PRN.  4. Allergic rhinitis - continue home regimen of Zyrtec.  5. GERD - PPI.  6.Constipation- Senokot, miralx prn. 7. prophylaxis - PPI for GI, Lovenox for DVT.        LOS: 2 days   THOMPSON,DANIEL 08/31/2011, 1:48 PM

## 2011-09-01 ENCOUNTER — Other Ambulatory Visit: Payer: Self-pay

## 2011-09-01 DIAGNOSIS — E871 Hypo-osmolality and hyponatremia: Secondary | ICD-10-CM | POA: Diagnosis not present

## 2011-09-01 LAB — CBC
HCT: 32.6 % — ABNORMAL LOW (ref 36.0–46.0)
MCH: 32 pg (ref 26.0–34.0)
MCHC: 35.6 g/dL (ref 30.0–36.0)
MCV: 89.8 fL (ref 78.0–100.0)
RDW: 14.5 % (ref 11.5–15.5)

## 2011-09-01 LAB — BASIC METABOLIC PANEL
BUN: 10 mg/dL (ref 6–23)
BUN: 9 mg/dL (ref 6–23)
Calcium: 9.6 mg/dL (ref 8.4–10.5)
Calcium: 9.1 mg/dL (ref 8.4–10.5)
Creatinine, Ser: 1 mg/dL (ref 0.50–1.10)
Creatinine, Ser: 0.89 mg/dL (ref 0.50–1.10)
GFR calc Af Amer: 56 mL/min — ABNORMAL LOW (ref >90)
GFR calc non Af Amer: 48 mL/min — ABNORMAL LOW (ref >90)
GFR calc non Af Amer: 56 mL/min — ABNORMAL LOW (ref >90)
Glucose, Bld: 150 mg/dL — ABNORMAL HIGH (ref 70–99)
Potassium: 3.8 mEq/L (ref 3.5–5.1)

## 2011-09-01 LAB — VITAMIN B12: Vitamin B-12: 648 pg/mL (ref 211–911)

## 2011-09-01 LAB — CREATININE, URINE, RANDOM: Creatinine, Urine: 28 mg/dL

## 2011-09-01 LAB — FOLATE: Folate: >20 ng/mL

## 2011-09-01 LAB — OSMOLALITY, URINE: Osmolality, Ur: 174 mOsm/kg — ABNORMAL LOW (ref 390–1090)

## 2011-09-01 MED ORDER — SODIUM CHLORIDE 0.9 % IV SOLN
INTRAVENOUS | Status: DC
  Administered 2011-09-01 – 2011-09-03 (×4): via INTRAVENOUS

## 2011-09-01 MED ORDER — MORPHINE SULFATE 2 MG/ML IJ SOLN
2.0000 mg | Freq: Once | INTRAMUSCULAR | Status: AC
  Administered 2011-09-01: 2 mg via INTRAVENOUS

## 2011-09-01 MED ORDER — ASPIRIN EC 325 MG PO TBEC
325.0000 mg | DELAYED_RELEASE_TABLET | Freq: Every day | ORAL | Status: DC
  Administered 2011-09-01 – 2011-09-02 (×2): 325 mg via ORAL
  Filled 2011-09-01 (×4): qty 1

## 2011-09-01 NOTE — Progress Notes (Signed)
Subjective: Complaining of sternal chest pain that radiates to left arm.   Objective: Vital signs in last 24 hours: Temp:  [97.3 F (36.3 C)-98.9 F (37.2 C)] 98.9 F (37.2 C) (11/11 2137) Pulse Rate:  [70-86] 85  (11/11 2137) Resp:  [16-18] 18  (11/11 2137) BP: (130-150)/(70-80) 150/80 mmHg (11/11 2137) SpO2:  [98 %] 98 % (11/11 2137) Weight change:  Last BM Date: 09/01/11  Intake/Output from previous day: 11/10 0701 - 11/11 0700 In: 1041.8 [P.O.:480; I.V.:561.8] Out: 850 [Urine:850] Intake/Output this shift: Total I/O In: -  Out: 200 [Urine:200]  General appearance: alert and cooperative Resp: clear to auscultation bilaterally Cardio: regular rate and rhythm GI: soft, non-tender; bowel sounds normal; no masses,  no organomegaly Extremities: extremities normal, atraumatic, no cyanosis or edema Neurologic: Grossly normal  Lab Results:  Basename 09/01/11 0448 08/31/11 0429  WBC 5.3 5.8  HGB 11.6* 11.6*  HCT 32.6* 33.7*  PLT 370 382   BMET  Basename 09/01/11 0448 08/31/11 0429  NA 127* 132*  K 3.9 3.8  CL 92* 97  CO2 26 25  GLUCOSE 99 113*  BUN 10 13  CREATININE 1.00 0.97  CALCIUM 9.6 9.4    Studies/Results: EKG shows sinus rhythm with PAC's.  ST&T was abnormality, consider lateral ischemia.  Medications: I have reviewed the patient's current medications.  Assessment/Plan: Chest pain. R/O MI - Pain improved after Morphine x 1. Cardiac panel pending. Started ASA 325mg  daily. Transfer to telemetry unit. VSS.  LOS: 3 days   [facility], Hiedi Touchton A. 09/01/2011, 11:01 PM

## 2011-09-01 NOTE — Plan of Care (Signed)
Problem: Phase III Progression Outcomes Goal: IV/normal saline lock discontinued Outcome: Not Progressing 09/01/11 Pt restarted on NS @ 69ml/hr due to low Sodium Level.

## 2011-09-01 NOTE — Progress Notes (Signed)
Subjective: Patient with nausea this morning. Patient states she had a bout of emesis this morning. No bowel movement. Patient denies any hip pain.  Objective: Vital signs in last 24 hours: Filed Vitals:   08/31/11 0523 08/31/11 1330 08/31/11 2200 09/01/11 0515  BP: 156/78 195/83 137/75 135/71  Pulse: 86 84 77 86  Temp: 97.9 F (36.6 C) 98 F (36.7 C) 97.8 F (36.6 C) 97.9 F (36.6 C)  TempSrc: Oral Oral Oral Oral  Resp: 18 16 16 16   Height:      Weight:      SpO2: 100% 100% 98% 98%    Intake/Output Summary (Last 24 hours) at 09/01/11 0907 Last data filed at 09/01/11 0900  Gross per 24 hour  Intake 901.75 ml  Output    650 ml  Net 251.75 ml    Weight change:   General: Alert, awake, oriented x3, in no acute distress. HEENT: No bruits, no goiter. Heart: Regular rate and rhythm, without murmurs, rubs, gallops. Lungs: Clear to auscultation bilaterally. Abdomen: Soft, nontender, nondistended, positive bowel sounds. Extremities: No clubbing cyanosis or edema with positive pedal pulses. Neuro: Grossly intact, nonfocal.   Lab Results:  Basename 09/01/11 0448 08/31/11 0429 08/30/11 0425  NA 127* 132* --  K 3.9 3.8 --  CL 92* 97 --  CO2 26 25 --  GLUCOSE 99 113* --  BUN 10 13 --  CREATININE 1.00 0.97 --  CALCIUM 9.6 9.4 --  MG -- -- 1.9  PHOS -- -- --    Basename 08/30/11 0425  AST 19  ALT 12  ALKPHOS 114  BILITOT 0.4  PROT 6.9  ALBUMIN 3.2*   No results found for this basename: LIPASE:2,AMYLASE:2 in the last 72 hours  Basename 09/01/11 0448 08/31/11 0429 08/29/11 1310  WBC 5.3 5.8 --  NEUTROABS -- -- 3.4  HGB 11.6* 11.6* --  HCT 32.6* 33.7* --  MCV 89.8 91.3 --  PLT 370 382 --   No results found for this basename: CKTOTAL:3,CKMB:3,CKMBINDEX:3,TROPONINI:3 in the last 72 hours No results found for this basename: POCBNP:3 in the last 72 hours No results found for this basename: DDIMER:2 in the last 72 hours No results found for this basename: HGBA1C:2  in the last 72 hours No results found for this basename: CHOL:2,HDL:2,LDLCALC:2,TRIG:2,CHOLHDL:2,LDLDIRECT:2 in the last 72 hours No results found for this basename: TSH,T4TOTAL,FREET3,T3FREE,THYROIDAB in the last 72 hours No results found for this basename: VITAMINB12:2,FOLATE:2,FERRITIN:2,TIBC:2,IRON:2,RETICCTPCT:2 in the last 72 hours  Micro Results: Recent Results (from the past 240 hour(s))  URINE CULTURE     Status: Normal   Collection Time   08/29/11 11:57 AM      Component Value Range Status Comment   Specimen Description URINE, CATHETERIZED   Final    Special Requests NONE   Final    Setup Time [id number]   Final    Colony Count >=100,000 COLONIES/ML   Final    Culture ESCHERICHIA COLI   Final    Report Status 08/31/2011 FINAL   Final    Organism ID, Bacteria ESCHERICHIA COLI   Final     Studies/Results: No results found.  Medications:     . aspirin EC  81 mg Oral Daily  . benzonatate  100 mg Oral TID  . cefTRIAXone (ROCEPHIN) IV  1 g Intravenous Q24H  . docusate sodium  100 mg Oral BID  . enoxaparin  40 mg Subcutaneous Q24H  . ferrous sulfate  325 mg Oral Q breakfast  . hydrochlorothiazide  12.5 mg  Oral Once  . hydrochlorothiazide  12.5 mg Oral Daily  . loratadine  10 mg Oral Daily  . multivitamins ther. w/minerals  1 tablet Oral Daily  . olmesartan  20 mg Oral Once  . olmesartan  20 mg Oral Daily  . pantoprazole  40 mg Oral Q1200  . potassium chloride  10 mEq Oral Daily  . sodium chloride  3 mL Intravenous Q12H    Assessment/Plan Principal Problem:  *Low back pain Active Problems:  Spinal stenosis of lumbar region at multiple levels  UTI (lower urinary tract infection)  Hyponatremia  1. Low back pain- Secondary to severe spinal stenosis, which has progressed since 2011 per CT scan. NeuroSurgery has seen the patient and recommend surgery if patient wishes or may consider steriod injection which may not have significant improving. Per NS MRI only if  patient wishes surgery. Patient wishes to discuss with her niece and is leaning against surgery at this time. Patient still contemplating ESI. PT /OT. Pain management. Follow.  2. E.Coli UTI - Rocephin D4/7 3. Hyponatremia - Check a urine sodium, FENA, serum and urine osmolality. Hold HCTZ. Gentle hydration.  4. HTN- resume home regimen except HCTZ secondary to # 3. Hydralazine PRN.  5. Allergic rhinitis - continue home regimen of Zyrtec.  6. GERD - PPI.  7.Constipation- Senokot, miralx prn. If no BM with Miralax will try soap suds enema. 8. prophylaxis - PPI for GI, Lovenox for DVT.          LOS: 3 days   THOMPSON,DANIEL 09/01/2011, 9:07 AM

## 2011-09-01 NOTE — Progress Notes (Signed)
D: 77yo [facility] with low back pain due to worsening spinal stenosis.  Pt with h/o HTN, GERD, spinal stenosis and Allergic Rhinitis. At 2210 pt c/o chest pain.  A: Assessed pt.  Pt positive for c/o radiating pain to Left arm as well.  VS assessed. Noted as follows BP 176/66, HR 92, Resp 18, O2 Sats 95% on RA.  Paged On Call for [facility] Service.  Spoke with Daphane Shepherd.  Received orders for stat EKG, cardiac enzymes, and Morphine IV.  EKG performed by NT.  Results paged to M. Lynch at approx 2220.  Received orders for O2 2L via [place], ASA, and transfer to tele bed asap. R: informed pt of POC and orders to transfer to tele bed.  Informed charge RN of pt's condition and orders to transfer. Paged AC J. Carlynn Purl to notify of transfer orders.  Pt to be transferred as soon as bed assignment provided. CKMerritt RN

## 2011-09-01 NOTE — Progress Notes (Signed)
Cm spoke with pt with nieces at bedside. Niece is HPOA. Given choice list for HHPT and DME. Family will provide  24 care. Decision not made. CM contact number left on choice list.

## 2011-09-02 ENCOUNTER — Other Ambulatory Visit: Payer: Self-pay

## 2011-09-02 ENCOUNTER — Inpatient Hospital Stay (HOSPITAL_COMMUNITY): Payer: Medicare Other

## 2011-09-02 ENCOUNTER — Encounter (HOSPITAL_COMMUNITY): Payer: Self-pay | Admitting: Physician Assistant

## 2011-09-02 DIAGNOSIS — I214 Non-ST elevation (NSTEMI) myocardial infarction: Secondary | ICD-10-CM | POA: Diagnosis not present

## 2011-09-02 LAB — CARDIAC PANEL(CRET KIN+CKTOT+MB+TROPI)
CK, MB: 27.7 ng/mL (ref 0.3–4.0)
CK, MB: 20.1 ng/mL (ref 0.3–4.0)
Troponin I: 7.42 ng/mL (ref <0.30)
Troponin I: 10.32 ng/mL (ref <0.30)

## 2011-09-02 LAB — BASIC METABOLIC PANEL
BUN: 7 mg/dL (ref 6–23)
CO2: 24 mEq/L (ref 19–32)
Chloride: 94 mEq/L — ABNORMAL LOW (ref 96–112)
Glucose, Bld: 110 mg/dL — ABNORMAL HIGH (ref 70–99)
Potassium: 3.9 mEq/L (ref 3.5–5.1)

## 2011-09-02 LAB — APTT: aPTT: 36 seconds (ref 24–37)

## 2011-09-02 LAB — CBC
HCT: 33.5 % — ABNORMAL LOW (ref 36.0–46.0)
Hemoglobin: 11.7 g/dL — ABNORMAL LOW (ref 12.0–15.0)
MCH: 31.4 pg (ref 26.0–34.0)
MCHC: 34.9 g/dL (ref 30.0–36.0)
MCV: 89.8 fL (ref 78.0–100.0)

## 2011-09-02 MED ORDER — ROSUVASTATIN CALCIUM 40 MG PO TABS
40.0000 mg | ORAL_TABLET | Freq: Every day | ORAL | Status: DC
  Administered 2011-09-03: 40 mg via ORAL
  Filled 2011-09-02 (×3): qty 1

## 2011-09-02 MED ORDER — NITROGLYCERIN 2 % TD OINT
1.0000 [in_us] | TOPICAL_OINTMENT | Freq: Four times a day (QID) | TRANSDERMAL | Status: DC
  Administered 2011-09-02 – 2011-09-04 (×8): 1 [in_us] via TOPICAL
  Filled 2011-09-02: qty 30

## 2011-09-02 MED ORDER — HEPARIN BOLUS VIA INFUSION
2000.0000 [IU] | Freq: Once | INTRAVENOUS | Status: AC
  Administered 2011-09-02: 2000 [IU] via INTRAVENOUS
  Filled 2011-09-02: qty 2000

## 2011-09-02 MED ORDER — METOPROLOL TARTRATE 25 MG PO TABS
25.0000 mg | ORAL_TABLET | Freq: Three times a day (TID) | ORAL | Status: DC
  Administered 2011-09-02 – 2011-09-03 (×2): 25 mg via ORAL
  Filled 2011-09-02 (×12): qty 1

## 2011-09-02 MED ORDER — CEFUROXIME AXETIL 250 MG PO TABS
250.0000 mg | ORAL_TABLET | Freq: Two times a day (BID) | ORAL | Status: DC
  Administered 2011-09-02 – 2011-09-05 (×6): 250 mg via ORAL
  Filled 2011-09-02 (×12): qty 1

## 2011-09-02 MED ORDER — CLOPIDOGREL BISULFATE 300 MG PO TABS
300.0000 mg | ORAL_TABLET | Freq: Once | ORAL | Status: AC
  Administered 2011-09-02: 300 mg via ORAL
  Filled 2011-09-02: qty 1

## 2011-09-02 MED ORDER — ROSUVASTATIN CALCIUM 40 MG PO TABS
40.0000 mg | ORAL_TABLET | ORAL | Status: AC
  Administered 2011-09-02: 40 mg via ORAL
  Filled 2011-09-02: qty 1

## 2011-09-02 MED ORDER — HEPARIN (PORCINE) IN NACL 100-0.45 UNIT/ML-% IJ SOLN
1100.0000 [IU]/h | INTRAMUSCULAR | Status: DC
  Administered 2011-09-02 – 2011-09-03 (×2): 1100 [IU]/h via INTRAVENOUS
  Filled 2011-09-02 (×5): qty 250

## 2011-09-02 MED ORDER — METOPROLOL TARTRATE 25 MG PO TABS
25.0000 mg | ORAL_TABLET | Freq: Two times a day (BID) | ORAL | Status: DC
  Administered 2011-09-02 (×2): 25 mg via ORAL
  Filled 2011-09-02 (×3): qty 1

## 2011-09-02 MED ORDER — CLOPIDOGREL BISULFATE 75 MG PO TABS
75.0000 mg | ORAL_TABLET | Freq: Every day | ORAL | Status: DC
  Administered 2011-09-03 – 2011-09-05 (×3): 75 mg via ORAL
  Filled 2011-09-02 (×4): qty 1

## 2011-09-02 MED ORDER — SODIUM CHLORIDE 0.9 % IV SOLN
1.0000 mL/kg/h | INTRAVENOUS | Status: DC
  Administered 2011-09-03: 1 mL/kg/h via INTRAVENOUS

## 2011-09-02 MED ORDER — FUROSEMIDE 10 MG/ML IJ SOLN
40.0000 mg | Freq: Once | INTRAMUSCULAR | Status: AC
  Administered 2011-09-02: 40 mg via INTRAVENOUS
  Filled 2011-09-02: qty 4

## 2011-09-02 NOTE — Consult Note (Signed)
ANTICOAGULATION CONSULT NOTE - Follow Up Consult  Pharmacy Consult for Heparin Indication: chest pain/ACS  No Known Allergies  Patient Measurements: Height: 5\' 7"  (170.2 cm) Weight: 157 lb 8 oz (71.442 kg) IBW/kg (Calculated) : 61.6    Vital Signs: Temp: 98.9 F (37.2 C) (11/12 1747) Temp src: Oral (11/12 1747) BP: 133/60 mmHg (11/12 1747) Pulse Rate: 80  (11/12 1747)  Labs:  Basename 09/02/11 1910 09/02/11 1445 09/02/11 1150 09/02/11 0713 09/01/11 2254 09/01/11 0448 08/31/11 0429  HGB -- -- -- 11.7* -- 11.6* --  HCT -- -- -- 33.5* -- 32.6* 33.7*  PLT -- -- -- 369 -- 370 382  APTT -- -- 36 -- -- -- --  LABPROT -- -- 15.1 -- -- -- --  INR -- -- 1.17 -- -- -- --  HEPARINUNFRC 0.43 -- -- -- -- -- --  CREATININE -- -- -- 0.87 0.89 1.00 --  CKTOTAL -- 322* -- 363* 326* -- --  CKMB -- 20.1* -- 27.7* 23.4* -- --  TROPONINI -- 8.21* -- 10.32* 7.42* -- --   Estimated Creatinine Clearance: 42.6 ml/min (by C-G formula based on Cr of 0.87).    Assessment: Heparin level at goal  Goal of Therapy:  Heparin level 0.3-0.7 units/ml   Plan:  Will continue heparin at same rate with a daily CBC and Heparin level to start at 5 AM tomorrow  Pamala Duffel L 09/02/2011,8:51 PM

## 2011-09-02 NOTE — Progress Notes (Signed)
CRITICAL VALUE ALERT  Critical value received:  CK-MB 23.4, Troponin : 7.42  Date of notification:  09/02/11   Time of notification:  0001  Critical value read back:yes  Nurse who received alert:  Sumner Boast  MD notified (1st page):  Anne Fu   Time of first page:  0011  MD notified (2nd page): N/A  Time of second page: N/A  Responding MD:  Anne Fu  Time MD responded:  [phone]

## 2011-09-02 NOTE — Consult Note (Signed)
CARDIOLOGY CONSULT NOTE  Patient ID: CHAUNTA BEJARANO MRN: [id number], DOB/AGE: [birth year] 77 y.o.  Admit date: 08/29/2011 Date of Consult: 09/02/2011  Primary Physician: Judie Petit, MD, MD Primary Cardiologist: New to [place]  Chief Complaint: back pain / can't walk Reason for Consultation: NSTEMI   HPI: Ms. Cagley is an 77 y/o F with no prior cardiac hx but a hx of HTN, carcinoid, & spinal stenosis who presented to the ER with worsening low back pain and difficulty with ambulation. She has been followed as OP by PCP and referred to PT for back pain and had been improving up until 2 days prior to admission when she felt her legs "giving way" along with right hip pain and lower back pain. Her cousin brought her to [facility] on Wednesday. No bowel/bladder incontinence. CT of the L Spine suggests chronic progressive severe lumbar degenerative changes including severe spinal stenosis as well as chronic Paget's disease of the right hemi pelvis. She was seen in consultation by neurosurgery who recommended surgery as the only major solution, but per discussion with the primary team, the does not want to proceed at this time. ESI is still a pending option.  We are asked to consult regarding NSTEMI - the pt complained of chest pain radiating to her left arm yesterday PM. She describes it as across her whole chest, not sharp, not associated with any nausea/SOB although she did have nausea & vomiting x 1 earlier yesterday AM. EKG last night showed NSR with PACs, TWI V5-V6 with subtle STchanges in lead III, Q's V1-V2, III with possibly some ST depression avL. Followup EKG this AM demonstrates continued lateral TWI without evidence for ST elevation or depression. Initial troponin was 7.42, having risen to 10.32 this AM. Peak MB 27.7/peak CK 363. Last night she received Morphine which improved her pain and ASA & NTG paste were started. 2D echo is also pending.  Not on systemic anticoagulation - only on Lovenox 40mg  DVT ppx  at the moment. She is maintained on BB as well. She denies any CP since last night, and denies any hx of similar pain including any exertional sx while doing her PT exercises earlier last week.  Of note, she also endorsed malodorous urine this admission and was subsequently dx with an [facility] UTI for which she is on antibiotics. She also has had progressively decreasing sodium from 134 on admit down to 126 today, and HCTZ was held yesterday and she started getting IVF yesterday @ 75 cc/hr. Without much improvement, Dr. Janee Morn (primary attending) has planned to give 1 dose of IV Lasix this AM and has locked her fluids.   Past Medical History  Diagnosis Date  . CARCINOID TUMOR 04/13/2007  . HYPERTENSION 04/13/2007  . ALLERGIC RHINITIS 04/13/2007  . GERD 04/13/2007  . HYPERGLYCEMIA 04/13/2007  . CHOLELITHIASIS 05/02/2010  . Arthritis   . Cancer   . Environmental allergies   . Diphtheria     age 77    No prior cardiac history, CVA, bleeding history or cardiac workup per pt.  Surgical History:  Past Surgical History  Procedure Date  . Sbo surgery   . Tonsillectomy     many years ago     Prescriptions prior to admission  Medication Sig Dispense Refill  . aspirin 81 MG tablet Take 81 mg by mouth daily.       . calcium-vitamin D (OSCAL WITH D 500-200) 500-200 MG-UNIT per tablet Take 1 tablet by mouth daily.       Marland Kitchen  cetirizine (ZYRTEC) 10 MG tablet Take 10 mg by mouth daily.       . ferrous sulfate 325 (65 FE) MG tablet Take 325 mg by mouth daily with breakfast.       . multivitamin (THERAGRAN) per tablet Take 1 tablet by mouth daily.       Marland Kitchen omeprazole (PRILOSEC) 20 MG capsule        . potassium chloride (K-DUR,KLOR-CON) 10 MEQ tablet        . valsartan-hydrochlorothiazide (DIOVAN HCT) 160-12.5 MG per tablet          Inpatient Medications:    . aspirin EC  325 mg Oral Daily  . benzonatate  100 mg Oral TID  . cefUROXime  250 mg Oral BID WC  . docusate sodium  100 mg Oral BID  .  enoxaparin  40 mg Subcutaneous Q24H  . ferrous sulfate  325 mg Oral Q breakfast  . furosemide  40 mg Intravenous Once  . loratadine  10 mg Oral Daily  . metoprolol tartrate  25 mg Oral BID  . multivitamins ther. w/minerals  1 tablet Oral Daily  . nitroGLYCERIN  1 inch Topical Q6H  . olmesartan  20 mg Oral Daily  . pantoprazole  40 mg Oral Q1200  . potassium chloride  10 mEq Oral Daily  . sodium chloride  3 mL Intravenous Q12H    Allergies: No Known Allergies  Social History: Never smoked. She has never used smokeless tobacco. She reports that she does not drink alcohol or use illicit drugs. Lives by herself, but has several family members that stop in including nieces. Up until 2 days ago, able to get around fairly well (has cane at house).  Family History  Problem Relation Age of Onset  . Hypertension Mother   . Uterine cancer Mother   . Ovarian cancer Mother   . Hypertension Father   . Stroke Father   . Hypertension Brother   . Hyperlipidemia Brother   . Heart attack Brother   . Lung cancer Sister      Review of Systems: General: negative for chills, fever, night sweats or weight changes.  Cardiovascular: Positive for CP. No dyspnea on exertion, edema, orthopnea, palpitations, paroxysmal nocturnal dyspnea or shortness of breath Dermatological: negative for rash Respiratory: +Occasional cough. No wheezing Urologic: negative for hematuria. Positive for urinary odor, increased frequency. Abdominal: Positive for n/v yesterday AM x 1. No diarrhea, bright red blood per rectum, melena, or hematemesis Neurologic: negative for visual changes, syncope, or dizziness All other systems reviewed and are otherwise negative except as noted above.  Labs:  [facility] 09/02/11 0713 09/01/11 2254  CKTOTAL 363* 326*  CKMB 27.7* 23.4*  TROPONINI 10.32* 7.42*   Lab Results  Component Value Date   WBC 7.9 09/02/2011   HGB 11.7* 09/02/2011   HCT 33.5* 09/02/2011   MCV 89.8 09/02/2011    PLT 369 09/02/2011    Lab 09/02/11 0713 08/30/11 0425  NA 126* --  K 3.9 --  CL 94* --  CO2 24 --  BUN 7 --  CREATININE 0.87 --  CALCIUM 9.0 --  PROT -- 6.9  BILITOT -- 0.4  ALKPHOS -- 114  ALT -- 12  AST -- 19  GLUCOSE 110* --   Lab Results  Component Value Date   CHOL 222* 12/02/2008   HDL 63.8 12/02/2008   LDLCALC 147* 12/02/2008   TRIG 57 12/02/2008     Radiology/Studies:  08/29/2011  Lumbar Spine 2-3 Views IMPRESSION: No  acute osseous abnormality.  Straightening of the usual lumbar lordosis which may reflect positioning and/or spasm.  Diffuse degenerative disc disease, spondylosis, and facet degenerative changes.  Scoliosis.  Degenerative changes involving the visualized sacroiliac joints and hips.    08/29/2011 Ct Lumbar Spine Wo Contrast IMPRESSION: 1.  Chronic severe lumbar degenerative changes including severe spinal stenosis at L3-L4 and L4-L5, with stenosis at the former appearing somewhat progressed since 2011. 2.  Chronic Paget's disease of the right hemi pelvis appears stable since 2007. 3. No acute osseous abnormality identified.    09/02/2011 CXR - portable - IMPRESSION: Poor inspiration. No active disease.  EKG:see above for discussion in HPI Telemetry: NSR occasional PAC's, some sinus pauses <1.5 seconds, no evidence of AV block  Physical Exam: Blood pressure 137/71, pulse 77, temperature 98.2 F (36.8 C), temperature source Oral, resp. rate 20, height 5\' 7"  (1.702 m), weight 163 lb 12.8 oz (74.3 kg), SpO2 99.00%. General: Well developed, well nourished elderly F in no acute distress. Head: Normocephalic, atraumatic, sclera non-icteric, no xanthomas, nares are without discharge.  Neck: Negative for carotid bruits. JVD not elevated. Lungs: Clear bilaterally to auscultation without wheezes, rales, or rhonchi. Breathing is unlabored. Heart: RRR with S1 S2. Soft short SEM appreciated. ?S4. No rubs appreciated. Abdomen: Soft, non-tender, non-distended with  normoactive bowel sounds. No hepatomegaly. No rebound/guarding. No obvious abdominal masses. Msk:  Strength and tone appears normal for age. Extremities: No clubbing or cyanosis. Tr sockline edema.  Distal pedal pulses are 1+ and equal bilaterally. Neuro: Alert and oriented X 3. Moves all extremities spontaneously. Psych:  Responds to questions appropriately with a normal affect.  Assessment and Plan: 1. Back pain/gait instability felt secondary to severe spinal stenosis - patient was seen by NS with surgery recommended as definitive tx, but patient has been leaning against surgery (in lieu of considering ESI) which is reasonable in light of age/current comorbidities. Continue mgmt per primary team.  2. NSTEMI - chest pain yesterday with elevated enzymes and EKG changes indicates likely underlying CAD, no clear precipitant otherwise (was slightly hypertensive yesterday, 160's). Continue ASA, BB. Will discuss with MD re: anticoagulation and w/u vs. med rx in light of patient profile. I don't see any reason why she shouldn't be on heparin for now. If considering cath, would take into consideration fall risk with #1 and would lean towards BMS over DES given duration of Plavix.   3. HTN - BP still 130-160. Goal SBP 130 or less, may consider uptitrating BB vs adding amlodipine.  4. UTI - on abx per primary team.  Signed, Ronie Spies PA-C 09/02/2011, 9:14 AM   Patient seen with PA, agree with note.    Patient had NSTEMI in the hospital.  Chest pain last night for about 30 minutes radiating to the arm.  Troponin to 10.  She had been in the hospital for spinal stenosis/back pain.  She decided that she does not want surgery. The pain is actually better and she has been walking with a walker.  She has had no further chest pain since last night.   1. NSTEMI: Troponin to 10 with nonspecific T wave changes.  Chest pain last night and no further chest pain today.  I had a long discussion with patient  regarding medical management versus catheterization.  She lives alone and is very functional.  She will not be undergoing back surgery though she may need epidural steroid injection in the future.  She has been walking with a walker.   -  ASA, Plavix load - Heparin gtt - Continue NTG paste - metoprolol 25 q 8 hrs.  - Crestor 40 - Left heart cath in am.  If intervention done, would aim towards BMS rather than DES in case she needs epidural steroid injection in the future.  - echo 2. Lumbar spinal stenosis: Patient does not want surgery. Pain is better and she is walking with walker.  She may need epidural steroids in the future.  As above, if gets stent, aim for BMS.   Nader Boys [employer]

## 2011-09-02 NOTE — Progress Notes (Signed)
TALKED TO PATIENT ABOUT DCP; PATIENT LIVES ALONE, LOTS OF [facility]ERS PRESENT / PER PATIENT, THEY HELP HER WITH HER CARE AT HOME AT TIMES; PATIENT REQUEST [facility] NEEDS - RN, PT, RW, CANE; KRISTIAN WITH [facility] CALLED FOR ARRANGEMENTS; BCHANDLER RN, BSN, MHA

## 2011-09-02 NOTE — Progress Notes (Signed)
Subjective: Patient had chest pain last night. Patient denies any current chest pain.   Objective: Vital signs in last 24 hours: Filed Vitals:   09/01/11 2327 09/02/11 0159 09/02/11 0207 09/02/11 0638  BP: 161/100 150/76 150/76 137/71  Pulse: 96  90 77  Temp: 98.2 F (36.8 C)  98.1 F (36.7 C) 98.2 F (36.8 C)  TempSrc: Oral  Oral   Resp: 20  20 20   Height:      Weight: 74.3 kg (163 lb 12.8 oz)     SpO2: 98%  100% 99%    Intake/Output Summary (Last 24 hours) at 09/02/11 0846 Last data filed at 09/02/11 0700  Gross per 24 hour  Intake   2090 ml  Output   1600 ml  Net    490 ml    Weight change:   General: Alert, awake, oriented x3, in no acute distress Heart: Regular rate and rhythm, without murmurs, rubs, gallops. Lungs: Minimal wheezes, dec BS in bases. Abdomen: Soft, nontender, nondistended, positive bowel sounds. Extremities: No clubbing cyanosis or edema with positive pedal pulses. Neuro: Grossly intact, nonfocal.   Lab Results:  [facility] 09/02/11 0713 09/01/11 2254  NA 126* 126*  K 3.9 3.8  CL 94* 93*  CO2 24 24  GLUCOSE 110* 150*  BUN 7 9  CREATININE 0.87 0.89  CALCIUM 9.0 9.1  MG -- --  PHOS -- --   No results found for this basename: AST:2,ALT:2,ALKPHOS:2,BILITOT:2,PROT:2,ALBUMIN:2 in the last 72 hours No results found for this basename: LIPASE:2,AMYLASE:2 in the last 72 hours  Basename 09/02/11 0713 09/01/11 0448  WBC 7.9 5.3  NEUTROABS -- --  HGB 11.7* 11.6*  HCT 33.5* 32.6*  MCV 89.8 89.8  PLT 369 370    Basename 09/02/11 0713 09/01/11 2254  CKTOTAL 363* 326*  CKMB 27.7* 23.4*  CKMBINDEX -- --  TROPONINI 10.32* 7.42*   No results found for this basename: POCBNP:3 in the last 72 hours No results found for this basename: DDIMER:2 in the last 72 hours No results found for this basename: HGBA1C:2 in the last 72 hours No results found for this basename: CHOL:2,HDL:2,LDLCALC:2,TRIG:2,CHOLHDL:2,LDLDIRECT:2 in the last 72 hours No results  found for this basename: TSH,T4TOTAL,FREET3,T3FREE,THYROIDAB in the last 72 hours  Basename 09/01/11 0945  VITAMINB12 648  FOLATE >20.0  FERRITIN 67  TIBC 305  IRON 62  RETICCTPCT --    Micro Results: Recent Results (from the past 240 hour(s))  URINE CULTURE     Status: Normal   Collection Time   08/29/11 11:57 AM      Component Value Range Status Comment   Specimen Description URINE, CATHETERIZED   Final    Special Requests NONE   Final    Setup Time [id number]   Final    Colony Count >=100,000 COLONIES/ML   Final    Culture ESCHERICHIA COLI   Final    Report Status 08/31/2011 FINAL   Final    Organism ID, Bacteria ESCHERICHIA COLI   Final     Studies/Results: No results found.  Medications:     . aspirin EC  325 mg Oral Daily  . benzonatate  100 mg Oral TID  . cefTRIAXone (ROCEPHIN) IV  1 g Intravenous Q24H  . docusate sodium  100 mg Oral BID  . enoxaparin  40 mg Subcutaneous Q24H  . ferrous sulfate  325 mg Oral Q breakfast  . hydrochlorothiazide  12.5 mg Oral Once  . loratadine  10 mg Oral Daily  . metoprolol tartrate  25 mg Oral BID  .  morphine injection  2 mg Intravenous Once  . multivitamins ther. w/minerals  1 tablet Oral Daily  . nitroGLYCERIN  1 inch Topical Q6H  . olmesartan  20 mg Oral Once  . olmesartan  20 mg Oral Daily  . pantoprazole  40 mg Oral Q1200  . potassium chloride  10 mEq Oral Daily  . sodium chloride  3 mL Intravenous Q12H  . DISCONTD: aspirin EC  81 mg Oral Daily  . DISCONTD: hydrochlorothiazide  12.5 mg Oral Daily    Assessment/Plan Principal Problem:  *Low back pain Active Problems:  NSTEMI (non-ST elevated myocardial infarction)  Spinal stenosis of lumbar region at multiple levels  UTI (lower urinary tract infection)  Hyponatremia  1. NSTEMI - Patient currently chest pain free with nitro patch. EKG from last night with t wave changes in v5-v6 and st t changes in lead III. Cardiac enzymes positive with troponins of 7.42 and  10.32. Will repeat EKG this morning, check a 2 d echo, check CXR, continue asa, beta blocker, and ARB. Will consult with cardiology for further evaluation and rxcs. 2. Low back pain- Secondary to severe spinal stenosis, which has progressed since 2011 per CT scan. NeuroSurgery has seen the patient and recommend surgery if patient wishes or may consider steriod injection which may not have significant improving. Per NS MRI only if patient wishes surgery. Patient wishes to discuss with her niece and is leaning against surgery at this time. Patient still contemplating ESI. PT /OT. Pain management. Follow.  2. E.Coli UTI - change rocephin to oral ceftin andtibiotic d 5/7. 3. Hyponatremia - NSL IVF. FENA 1.15. Will give a dose of lasix and follow. 4. HTN- stable. Continue ARB, Beta blocker. HCTZ on hold.Hydralazine PRN.  5. Allergic rhinitis - continue home regimen of Zyrtec.  6. GERD - PPI.  7.Constipation- resolved. Patient with BM yesterday. Follow. 8. prophylaxis - PPI for GI, Lovenox for DVT.          LOS: 4 days   [facility] 09/02/2011, 8:46 AM

## 2011-09-02 NOTE — Progress Notes (Signed)
Patient transfered from the 6th  Floor to 1445, patient alert and oriented X 3  Denies any chest pain or distress. Placed on telemetry NSR. Elevated BP in 160s, MD aware and started new medication. Pt in bed resting, will continue to monitor. Skin intact with moles on face, upper chest and upper back.

## 2011-09-02 NOTE — Progress Notes (Signed)
ANTICOAGULATION CONSULT NOTE - Initial Consult  Pharmacy Consult for Heparin Indication: NSTEMI  No Known Allergies  Patient Measurements: Height: 5\' 7"  (170.2 cm) (patient stated) Weight: 163 lb 12.8 oz (74.3 kg) IBW/kg (Calculated) : 61.6  Adjusted Body Weight: 66.8kg  Vital Signs: Temp: 98.1 F (36.7 C) (11/12 0900) Temp src: Oral (11/12 0207) BP: 156/80 mmHg (11/12 0900) Pulse Rate: 81  (11/12 0900)  Labs:  Basename 09/02/11 0713 09/01/11 2254 09/01/11 0448 08/31/11 0429  HGB 11.7* -- 11.6* --  HCT 33.5* -- 32.6* 33.7*  PLT 369 -- 370 382  APTT -- -- -- --  LABPROT -- -- -- --  INR -- -- -- --  HEPARINUNFRC -- -- -- --  CREATININE 0.87 0.89 1.00 --  CKTOTAL 363* 326* -- --  CKMB 27.7* 23.4* -- --  TROPONINI 10.32* 7.42* -- --   Estimated Creatinine Clearance: 46.2 ml/min (by C-G formula based on Cr of 0.87).  Medical History: Past Medical History  Diagnosis Date  . CARCINOID TUMOR 04/13/2007  . HYPERTENSION 04/13/2007  . ALLERGIC RHINITIS 04/13/2007  . GERD 04/13/2007  . HYPERGLYCEMIA 04/13/2007  . CHOLELITHIASIS 05/02/2010  . Arthritis   . Cancer   . Environmental allergies     has mucus in throat  . Diphtheria     age 35    Medications:  Scheduled:    . aspirin EC  325 mg Oral Daily  . benzonatate  100 mg Oral TID  . cefUROXime  250 mg Oral BID WC  . clopidogrel  300 mg Oral Once  . clopidogrel  75 mg Oral Q breakfast  . docusate sodium  100 mg Oral BID  . ferrous sulfate  325 mg Oral Q breakfast  . furosemide  40 mg Intravenous Once  . heparin  2,000 Units Intravenous Once  . loratadine  10 mg Oral Daily  . metoprolol tartrate  25 mg Oral Q8H  .  morphine injection  2 mg Intravenous Once  . multivitamins ther. w/minerals  1 tablet Oral Daily  . nitroGLYCERIN  1 inch Topical Q6H  . olmesartan  20 mg Oral Once  . olmesartan  20 mg Oral Daily  . pantoprazole  40 mg Oral Q1200  . potassium chloride  10 mEq Oral Daily  . rosuvastatin  40 mg Oral  NOW   Followed by  . rosuvastatin  40 mg Oral Q0600  . sodium chloride  3 mL Intravenous Q12H  . DISCONTD: aspirin EC  81 mg Oral Daily  . DISCONTD: cefTRIAXone (ROCEPHIN) IV  1 g Intravenous Q24H  . DISCONTD: enoxaparin  40 mg Subcutaneous Q24H  . DISCONTD: hydrochlorothiazide  12.5 mg Oral Once  . DISCONTD: metoprolol tartrate  25 mg Oral BID    Assessment: 77 yo female with no previous cardiac history presents with back pain, developed chest pain yesterday & positive cardiac enzymes.  Now to begin full-dose anticoag with IV heparin with possible plans for heart cath.  Patient received lovenox 40mg  last pm @ 22:39, therefore will give a reduced-dose bolus.  Goal of Therapy:  Heparin level 0.3-0.7 units/ml   Plan:  1. Check baseline PT/INT/APTT 2. Heparin 2000 units IV bolus 3. Heparin 1100 units/hr IV infusion 4. Check heparin level in 6hrs 5. Check heparin level & CBC daily while on heparin  Loralee Pacas R 09/02/2011,11:38 AM

## 2011-09-03 ENCOUNTER — Ambulatory Visit (HOSPITAL_COMMUNITY): Admission: RE | Admit: 2011-09-03 | Payer: Medicare Other | Source: Ambulatory Visit | Admitting: Cardiology

## 2011-09-03 ENCOUNTER — Encounter: Payer: Medicare Other | Admitting: Rehabilitative and Restorative Service Providers"

## 2011-09-03 ENCOUNTER — Encounter (HOSPITAL_COMMUNITY)
Admission: EM | Disposition: A | Discharge status: Home Health | Payer: Self-pay | Source: Home / Self Care | Attending: Internal Medicine

## 2011-09-03 ENCOUNTER — Other Ambulatory Visit: Payer: Self-pay

## 2011-09-03 ENCOUNTER — Encounter (HOSPITAL_COMMUNITY): Payer: Self-pay

## 2011-09-03 DIAGNOSIS — I059 Rheumatic mitral valve disease, unspecified: Secondary | ICD-10-CM

## 2011-09-03 DIAGNOSIS — I2510 Atherosclerotic heart disease of native coronary artery without angina pectoris: Secondary | ICD-10-CM

## 2011-09-03 HISTORY — PX: PERCUTANEOUS CORONARY STENT INTERVENTION (PCI-S): SHX5485

## 2011-09-03 HISTORY — PX: LEFT HEART CATHETERIZATION WITH CORONARY ANGIOGRAM: SHX5451

## 2011-09-03 LAB — BASIC METABOLIC PANEL
BUN: 10 mg/dL (ref 6–23)
Calcium: 8.3 mg/dL — ABNORMAL LOW (ref 8.4–10.5)
Calcium: 8.7 mg/dL (ref 8.4–10.5)
GFR calc Af Amer: 58 mL/min — ABNORMAL LOW (ref >90)
GFR calc Af Amer: 56 mL/min — ABNORMAL LOW (ref >90)
GFR calc non Af Amer: 50 mL/min — ABNORMAL LOW (ref >90)
GFR calc non Af Amer: 48 mL/min — ABNORMAL LOW (ref >90)
Glucose, Bld: 115 mg/dL — ABNORMAL HIGH (ref 70–99)
Glucose, Bld: 104 mg/dL — ABNORMAL HIGH (ref 70–99)
Potassium: 3.4 mEq/L — ABNORMAL LOW (ref 3.5–5.1)
Potassium: 4 mEq/L (ref 3.5–5.1)
Sodium: 125 mEq/L — ABNORMAL LOW (ref 135–145)
Sodium: 125 mEq/L — ABNORMAL LOW (ref 135–145)

## 2011-09-03 LAB — CORTISOL: Cortisol, Plasma: 17.4 ug/dL

## 2011-09-03 LAB — CBC
Hemoglobin: 10.7 g/dL — ABNORMAL LOW (ref 12.0–15.0)
MCH: 30.8 pg (ref 26.0–34.0)
Platelets: 335 10*3/uL (ref 150–400)
RBC: 3.47 MIL/uL — ABNORMAL LOW (ref 3.87–5.11)

## 2011-09-03 LAB — HEPARIN LEVEL (UNFRACTIONATED): Heparin Unfractionated: 0.53 IU/mL (ref 0.30–0.70)

## 2011-09-03 SURGERY — LEFT HEART CATHETERIZATION WITH CORONARY ANGIOGRAM
Anesthesia: LOCAL

## 2011-09-03 MED ORDER — ACETAMINOPHEN 325 MG PO TABS
650.0000 mg | ORAL_TABLET | ORAL | Status: DC | PRN
  Administered 2011-09-03: 650 mg via ORAL
  Filled 2011-09-03: qty 2

## 2011-09-03 MED ORDER — SODIUM CHLORIDE 0.9 % IV SOLN: 1.0000 mL/kg/h | INTRAVENOUS | Status: DC

## 2011-09-03 MED ORDER — ASPIRIN 81 MG PO CHEW
81.0000 mg | CHEWABLE_TABLET | Freq: Every day | ORAL | Status: DC
  Administered 2011-09-04 (×2): 81 mg via ORAL
  Filled 2011-09-03 (×2): qty 1

## 2011-09-03 MED ORDER — NITROGLYCERIN 0.2 MG/ML ON CALL CATH LAB
INTRAVENOUS | Status: AC
  Filled 2011-09-03: qty 1

## 2011-09-03 MED ORDER — HEPARIN (PORCINE) IN NACL 2-0.9 UNIT/ML-% IJ SOLN
INTRAMUSCULAR | Status: AC
  Filled 2011-09-03: qty 2000

## 2011-09-03 MED ORDER — FENTANYL CITRATE 0.05 MG/ML IJ SOLN
INTRAMUSCULAR | Status: AC
  Filled 2011-09-03: qty 2

## 2011-09-03 MED ORDER — BIVALIRUDIN 250 MG IV SOLR
INTRAVENOUS | Status: AC
  Filled 2011-09-03: qty 250

## 2011-09-03 MED ORDER — LIDOCAINE HCL (PF) 1 % IJ SOLN
INTRAMUSCULAR | Status: AC
  Filled 2011-09-03: qty 30

## 2011-09-03 MED ORDER — SODIUM CHLORIDE 0.9 % IV SOLN
INTRAVENOUS | Status: DC
  Administered 2011-09-03: 13:00:00 via INTRAVENOUS

## 2011-09-03 MED ORDER — POTASSIUM CHLORIDE CRYS ER 20 MEQ PO TBCR
40.0000 meq | EXTENDED_RELEASE_TABLET | Freq: Once | ORAL | Status: AC
  Administered 2011-09-03: 40 meq via ORAL
  Filled 2011-09-03: qty 2

## 2011-09-03 MED ORDER — VITAMINS A & D EX OINT
TOPICAL_OINTMENT | CUTANEOUS | Status: AC
  Administered 2011-09-03: 15:00:00
  Filled 2011-09-03: qty 5

## 2011-09-03 MED ORDER — ONDANSETRON HCL 4 MG/2ML IJ SOLN: 4.0000 mg | Freq: Four times a day (QID) | INTRAMUSCULAR | Status: DC | PRN

## 2011-09-03 MED ORDER — MIDAZOLAM HCL 2 MG/2ML IJ SOLN
INTRAMUSCULAR | Status: AC
  Filled 2011-09-03: qty 2

## 2011-09-03 NOTE — Interval H&P Note (Signed)
History and Physical Interval Note:   09/03/2011   6:08 PM   Becky Morales  has presented today for surgery, with the diagnosis of chest pain  The various methods of treatment have been discussed with the patient and family. After consideration of risks, benefits and other options for treatment, the patient has consented to  Procedure(s): LEFT HEART CATHETERIZATION WITH CORONARY ANGIOGRAM as a surgical intervention .  The patients' history has been reviewed, patient examined, no change in status, stable for surgery.  I have reviewed the patients' chart and labs.  Questions were answered to the patient's satisfaction.     Veena Sturgess [place]  MD

## 2011-09-03 NOTE — Progress Notes (Signed)
Subjective: Patient complains of low abdominal pain from no bowel movement; no chest pain or SOB   Physical Exam:   Blood pressure 124/69, pulse 75, temperature 98.1 F (36.7 C), temperature source Oral, resp. rate 16, height 5' 7" (1.702 m), weight 157 lb 8 oz (71.442 kg), SpO2 98.00%. Temp:  [98.1 F (36.7 C)-99.4 F (37.4 C)] 98.1 F (36.7 C) (11/13 0451) Pulse Rate:  [75-84] 75  (11/13 0451) Resp:  [16-20] 16  (11/13 0451) BP: (116-158)/(59-80) 124/69 mmHg (11/13 0451) SpO2:  [98 %-100 %] 98 % (11/13 0451) Weight:  [157 lb 8 oz (71.442 kg)] 157 lb 8 oz (71.442 kg) (11/12 1747) Weight change: -6 lb 4.8 oz (-2.859 kg) I/O 11/12 0701 - 11/13 0700 In: 1226.4 [P.O.:1160; I.V.:66.4] Out: 1075 [Urine:1075]  HEENT-normal/normal eyelids Neck supple chest - CTA/ normal expansion CV - RRR/normal S1 and S2; 2/6 systolic murmur left sternal border Abdomen -NT/ND, no HSM, no mass, + bowel sounds, no bruit 2+ femoral pulses, no bruits Ext-no edema, chords, 2+ DP Neuro-grossly nonfocal  Results for orders placed during the hospital encounter of 08/29/11 (from the past 48 hour(s))  OSMOLALITY     Status: Abnormal   Collection Time   09/01/11  9:45 AM      Component Value Range Comment   Osmolality 265 (*) 275 - 300 (mOsm/kg)   VITAMIN B12     Status: Normal   Collection Time   09/01/11  9:45 AM      Component Value Range Comment   Vitamin B-12 648  211 - 911 (pg/mL)   FOLATE     Status: Normal   Collection Time   09/01/11  9:45 AM      Component Value Range Comment   Folate >20.0     IRON AND TIBC     Status: Normal   Collection Time   09/01/11  9:45 AM      Component Value Range Comment   Iron 62  42 - 135 (ug/dL)    TIBC 305  250 - 470 (ug/dL)    Saturation Ratios 20  20 - 55 (%)    UIBC 243  125 - 400 (ug/dL)   FERRITIN     Status: Normal   Collection Time   09/01/11  9:45 AM      Component Value Range Comment   Ferritin 67  10 - 291 (ng/mL)   SODIUM, URINE, RANDOM      Status: Normal   Collection Time   09/01/11  5:10 PM      Component Value Range Comment   Sodium, Ur 41     CREATININE, URINE, RANDOM     Status: Normal   Collection Time   09/01/11  5:10 PM      Component Value Range Comment   Creatinine, Urine 28.0     OSMOLALITY, URINE     Status: Abnormal   Collection Time   09/01/11  5:10 PM      Component Value Range Comment   Osmolality, Ur 174 (*) 390 - 1090 (mOsm/kg)   BASIC METABOLIC PANEL     Status: Abnormal   Collection Time   09/01/11 10:54 PM      Component Value Range Comment   Sodium 126 (*) 135 - 145 (mEq/L)    Potassium 3.8  3.5 - 5.1 (mEq/L)    Chloride 93 (*) 96 - 112 (mEq/L)    CO2 24  19 - 32 (mEq/L)    Glucose, Bld 150 (*)   70 - 99 (mg/dL)    BUN 9  6 - 23 (mg/dL)    Creatinine, Ser 0.89  0.50 - 1.10 (mg/dL)    Calcium 9.1  8.4 - 10.5 (mg/dL)    GFR calc non Af Amer 56 (*) >90 (mL/min)    GFR calc Af Amer 65 (*) >90 (mL/min)   CARDIAC PANEL(CRET KIN+CKTOT+MB+TROPI)     Status: Abnormal   Collection Time   09/01/11 10:54 PM      Component Value Range Comment   Total CK 326 (*) 7 - 177 (U/L)    CK, MB 23.4 (*) 0.3 - 4.0 (ng/mL)    Troponin I 7.42 (*) <0.30 (ng/mL)    Relative Index 7.2 (*) 0.0 - 2.5    BASIC METABOLIC PANEL     Status: Abnormal   Collection Time   09/02/11  7:13 AM      Component Value Range Comment   Sodium 126 (*) 135 - 145 (mEq/L)    Potassium 3.9  3.5 - 5.1 (mEq/L)    Chloride 94 (*) 96 - 112 (mEq/L)    CO2 24  19 - 32 (mEq/L)    Glucose, Bld 110 (*) 70 - 99 (mg/dL)    BUN 7  6 - 23 (mg/dL)    Creatinine, Ser 0.87  0.50 - 1.10 (mg/dL)    Calcium 9.0  8.4 - 10.5 (mg/dL)    GFR calc non Af Amer 57 (*) >90 (mL/min)    GFR calc Af Amer 66 (*) >90 (mL/min)   CBC     Status: Abnormal   Collection Time   09/02/11  7:13 AM      Component Value Range Comment   WBC 7.9  4.0 - 10.5 (K/uL)    RBC 3.73 (*) 3.87 - 5.11 (MIL/uL)    Hemoglobin 11.7 (*) 12.0 - 15.0 (g/dL)    HCT 33.5 (*) 36.0 - 46.0  (%)    MCV 89.8  78.0 - 100.0 (fL)    MCH 31.4  26.0 - 34.0 (pg)    MCHC 34.9  30.0 - 36.0 (g/dL)    RDW 14.4  11.5 - 15.5 (%)    Platelets 369  150 - 400 (K/uL)   CARDIAC PANEL(CRET KIN+CKTOT+MB+TROPI)     Status: Abnormal   Collection Time   09/02/11  7:13 AM      Component Value Range Comment   Total CK 363 (*) 7 - 177 (U/L)    CK, MB 27.7 (*) 0.3 - 4.0 (ng/mL) CRITICAL VALUE NOTED.  VALUE IS CONSISTENT WITH PREVIOUSLY REPORTED AND CALLED VALUE.   Troponin I 10.32 (*) <0.30 (ng/mL)    Relative Index 7.6 (*) 0.0 - 2.5    APTT     Status: Normal   Collection Time   09/02/11 11:50 AM      Component Value Range Comment   aPTT 36  24 - 37 (seconds)   PROTIME-INR     Status: Normal   Collection Time   09/02/11 11:50 AM      Component Value Range Comment   Prothrombin Time 15.1  11.6 - 15.2 (seconds)    INR 1.17  0.00 - 1.49    CARDIAC PANEL(CRET KIN+CKTOT+MB+TROPI)     Status: Abnormal   Collection Time   09/02/11  2:45 PM      Component Value Range Comment   Total CK 322 (*) 7 - 177 (U/L)    CK, MB 20.1 (*) 0.3 - 4.0 (ng/mL) CRITICAL VALUE NOTED.    VALUE IS CONSISTENT WITH PREVIOUSLY REPORTED AND CALLED VALUE.   Troponin I 8.21 (*) <0.30 (ng/mL)    Relative Index 6.2 (*) 0.0 - 2.5    HEPARIN LEVEL     Status: Normal   Collection Time   09/02/11  7:10 PM      Component Value Range Comment   Heparin Unfractionated 0.43  0.30 - 0.70 (IU/mL)   BASIC METABOLIC PANEL     Status: Abnormal   Collection Time   09/03/11  4:38 AM      Component Value Range Comment   Sodium 125 (*) 135 - 145 (mEq/L)    Potassium 3.4 (*) 3.5 - 5.1 (mEq/L)    Chloride 91 (*) 96 - 112 (mEq/L)    CO2 26  19 - 32 (mEq/L)    Glucose, Bld 115 (*) 70 - 99 (mg/dL)    BUN 9  6 - 23 (mg/dL)    Creatinine, Ser 0.98  0.50 - 1.10 (mg/dL)    Calcium 8.3 (*) 8.4 - 10.5 (mg/dL)    GFR calc non Af Amer 50 (*) >90 (mL/min)    GFR calc Af Amer 58 (*) >90 (mL/min)   CBC     Status: Abnormal   Collection Time    09/03/11  4:38 AM      Component Value Range Comment   WBC 10.6 (*) 4.0 - 10.5 (K/uL)    RBC 3.47 (*) 3.87 - 5.11 (MIL/uL)    Hemoglobin 10.7 (*) 12.0 - 15.0 (g/dL)    HCT 31.2 (*) 36.0 - 46.0 (%)    MCV 89.9  78.0 - 100.0 (fL)    MCH 30.8  26.0 - 34.0 (pg)    MCHC 34.3  30.0 - 36.0 (g/dL)    RDW 14.2  11.5 - 15.5 (%)    Platelets 335  150 - 400 (K/uL)   HEPARIN LEVEL     Status: Normal   Collection Time   09/03/11  4:38 AM      Component Value Range Comment   Heparin Unfractionated 0.53  0.30 - 0.70 (IU/mL)     Dg Chest Portable 1 View  09/02/2011  *RADIOLOGY REPORT*  Clinical Data: Short of breath.  Possible congestive heart failure.  PORTABLE CHEST - 1 VIEW  Comparison: 04/19/2010  Findings: Artifact overlies chest.  Heart size is normal.  There is a poor inspiration.  Allowing for that, the lungs are clear the vascularity is normal.  No effusions.  No focal findings.  IMPRESSION: Poor inspiration.  No active disease.  Original Report Authenticated By: MARK E. SHOGRY, M.D.    Assessment/Plan Patient Active Hospital Problem List: NSTEMI (non-ST elevated myocardial infarction) (09/02/2011) Continue ASA, plavix, beta blocker and heparin; continue statin; she will need cath. May need to delay due to hyponatremia; will review with interventional colleagues. Low back pain (08/29/2011) Management per primary care. UTI (lower urinary tract infection) (08/29/2011) Continue antibiotics Hyponatremia (09/01/2011) Etiology unclear; fluid restrict; follow NA.   Brian Crenshaw MD 09/03/2011, 6:33 AM    

## 2011-09-03 NOTE — Progress Notes (Signed)
Becky Morales Score at bedside to assess patient. Becky Morales

## 2011-09-03 NOTE — Progress Notes (Signed)
Subjective: Patient denies any chest pain. Patient states had BM today and abdominal pain improved. No back or hip pain.   Objective: Vital signs in last 24 hours: Filed Vitals:   09/02/11 1741 09/02/11 1747 09/03/11 0210 09/03/11 0451  BP: 123/59 133/60 116/69 124/69  Pulse: 84 80 76 75  Temp:  98.9 F (37.2 C) 99.4 F (37.4 C) 98.1 F (36.7 C)  TempSrc:  Oral Oral Oral  Resp:  18 16 16   Height:  5\' 7"  (1.702 m)    Weight:  71.442 kg (157 lb 8 oz)    SpO2:  100% 100% 98%    Intake/Output Summary (Last 24 hours) at 09/03/11 0819 Last data filed at 09/03/11 0300  Gross per 24 hour  Intake 1226.38 ml  Output   1075 ml  Net 151.38 ml    Weight change: -2.859 kg (-6 lb 4.8 oz)  General: Alert, awake, oriented x3, in no acute distress Heart: Regular rate and rhythm, with 2/6 SEM Lungs: CTAB Abdomen: Soft, nontender, nondistended, positive bowel sounds. Extremities: No clubbing cyanosis or edema with positive pedal pulses. Neuro: Grossly intact, nonfocal.   Lab Results:  Basename 09/03/11 0438 09/02/11 0713  NA 125* 126*  K 3.4* 3.9  CL 91* 94*  CO2 26 24  GLUCOSE 115* 110*  BUN 9 7  CREATININE 0.98 0.87  CALCIUM 8.3* 9.0  MG -- --  PHOS -- --   No results found for this basename: AST:2,ALT:2,ALKPHOS:2,BILITOT:2,PROT:2,ALBUMIN:2 in the last 72 hours No results found for this basename: LIPASE:2,AMYLASE:2 in the last 72 hours  Basename 09/03/11 0438 09/02/11 0713  WBC 10.6* 7.9  NEUTROABS -- --  HGB 10.7* 11.7*  HCT 31.2* 33.5*  MCV 89.9 89.8  PLT 335 369    Basename 09/02/11 1445 09/02/11 0713 09/01/11 2254  CKTOTAL 322* 363* 326*  CKMB 20.1* 27.7* 23.4*  CKMBINDEX -- -- --  TROPONINI 8.21* 10.32* 7.42*   No results found for this basename: POCBNP:3 in the last 72 hours No results found for this basename: DDIMER:2 in the last 72 hours No results found for this basename: HGBA1C:2 in the last 72 hours No results found for this basename:  CHOL:2,HDL:2,LDLCALC:2,TRIG:2,CHOLHDL:2,LDLDIRECT:2 in the last 72 hours No results found for this basename: TSH,T4TOTAL,FREET3,T3FREE,THYROIDAB in the last 72 hours  Basename 09/01/11 0945  VITAMINB12 648  FOLATE >20.0  FERRITIN 67  TIBC 305  IRON 62  RETICCTPCT --    Micro Results: Recent Results (from the past 240 hour(s))  URINE CULTURE     Status: Normal   Collection Time   08/29/11 11:57 AM      Component Value Range Status Comment   Specimen Description URINE, CATHETERIZED   Final    Special Requests NONE   Final    Setup Time [id number]   Final    Colony Count >=100,000 COLONIES/ML   Final    Culture ESCHERICHIA COLI   Final    Report Status 08/31/2011 FINAL   Final    Organism ID, Bacteria ESCHERICHIA COLI   Final     Studies/Results: Dg Chest Portable 1 View  09/02/2011  *RADIOLOGY REPORT*  Clinical Data: Short of breath.  Possible congestive heart failure.  PORTABLE CHEST - 1 VIEW  Comparison: 04/19/2010  Findings: Artifact overlies chest.  Heart size is normal.  There is a poor inspiration.  Allowing for that, the lungs are clear the vascularity is normal.  No effusions.  No focal findings.  IMPRESSION: Poor inspiration.  No active disease.  Original Report Authenticated By: Thomasenia Sales, M.D.    Medications:     . aspirin EC  325 mg Oral Daily  . benzonatate  100 mg Oral TID  . cefUROXime  250 mg Oral BID WC  . clopidogrel  300 mg Oral Once  . clopidogrel  75 mg Oral Q breakfast  . docusate sodium  100 mg Oral BID  . ferrous sulfate  325 mg Oral Q breakfast  . furosemide  40 mg Intravenous Once  . heparin  2,000 Units Intravenous Once  . loratadine  10 mg Oral Daily  . metoprolol tartrate  25 mg Oral Q8H  . multivitamins ther. w/minerals  1 tablet Oral Daily  . nitroGLYCERIN  1 inch Topical Q6H  . olmesartan  20 mg Oral Once  . olmesartan  20 mg Oral Daily  . pantoprazole  40 mg Oral Q1200  . potassium chloride  10 mEq Oral Daily  . potassium  chloride  40 mEq Oral Once  . rosuvastatin  40 mg Oral NOW   Followed by  . rosuvastatin  40 mg Oral Q0600  . sodium chloride  3 mL Intravenous Q12H  . DISCONTD: cefTRIAXone (ROCEPHIN) IV  1 g Intravenous Q24H  . DISCONTD: enoxaparin  40 mg Subcutaneous Q24H  . DISCONTD: hydrochlorothiazide  12.5 mg Oral Once  . DISCONTD: metoprolol tartrate  25 mg Oral BID    Assessment/Plan Principal Problem:  *NSTEMI (non-ST elevated myocardial infarction) Active Problems:  Low back pain  Spinal stenosis of lumbar region at multiple levels  UTI (lower urinary tract infection)  Hyponatremia  1. NSTEMI - Patient currently chest pain free with nitro patch. Cardiac enzymes positive with troponins of 7.42 and 10.32.  2 d echo pending. CXR with no active disease,continue asa, beta blocker, plavix, heparin, statin. Will hold ARB for 3 days for hyponatremia correction. Cardiology following and deciding on when to do cath based on hyponatremia.  2. Low back pain- Secondary to severe spinal stenosis, which has progressed since 2011 per CT scan. NeuroSurgery has seen the patient and recommend surgery if patient wishes or may consider steriod injection which may not have significant improving. Patient doesnot want surgery at this time. Patient still contemplating ESI. PT /OT. Pain management. Follow.  2. E.Coli UTI - ceftin antibiotic d 6/7. 3. Hyponatremia - Unknown etiology. Urine and serum osmolality low. Sodium levels decreasing. Patient looks clinically dry. FENA 1.15. Will increase IVF to 125cc/hr, will hold ARB for 3 days for hyponatremia correction, check a random cortisol, check a TSH, repeat BMET this afternoon. Follow. 4. HTN- stable. Hold ARB secondary to # 3. Continue  Beta blocker. HCTZ on hold.Hydralazine PRN.  5. Allergic rhinitis - continue home regimen of Zyrtec.  6. GERD - PPI.  7.Constipation- resolved. Patient with BM today. Follow. 8. prophylaxis - PPI for GI, Lovenox for DVT.           LOS: 5 days   Zeidy Tayag 09/03/2011, 8:19 AM

## 2011-09-03 NOTE — Progress Notes (Signed)
[name] referral for SNF placement. RNCM following for HH/DME for d/c home. No other [name] needs reported or noted. [name] signing off.  Dellie Burns, MSW, LCSWA [phone] (Coverage)

## 2011-09-03 NOTE — H&P (View-Only) (Signed)
Subjective: Patient complains of low abdominal pain from no bowel movement; no chest pain or SOB   Physical Exam:   Blood pressure 124/69, pulse 75, temperature 98.1 F (36.7 C), temperature source Oral, resp. rate 16, height 5\' 7"  (1.702 m), weight 157 lb 8 oz (71.442 kg), SpO2 98.00%. Temp:  [98.1 F (36.7 C)-99.4 F (37.4 C)] 98.1 F (36.7 C) (11/13 0451) Pulse Rate:  [75-84] 75  (11/13 0451) Resp:  [16-20] 16  (11/13 0451) BP: (116-158)/(59-80) 124/69 mmHg (11/13 0451) SpO2:  [98 %-100 %] 98 % (11/13 0451) Weight:  [157 lb 8 oz (71.442 kg)] 157 lb 8 oz (71.442 kg) (11/12 1747) Weight change: -6 lb 4.8 oz (-2.859 kg) I/O 11/12 0701 - 11/13 0700 In: 1226.4 [P.O.:1160; I.V.:66.4] Out: 1075 [Urine:1075]  HEENT-normal/normal eyelids Neck supple chest - CTA/ normal expansion CV - RRR/normal S1 and S2; 2/6 systolic murmur left sternal border Abdomen -NT/ND, no HSM, no mass, + bowel sounds, no bruit 2+ femoral pulses, no bruits Ext-no edema, chords, 2+ DP Neuro-grossly nonfocal  Results for orders placed during the hospital encounter of 08/29/11 (from the past 48 hour(s))  OSMOLALITY     Status: Abnormal   Collection Time   09/01/11  9:45 AM      Component Value Range Comment   Osmolality 265 (*) 275 - 300 (mOsm/kg)   VITAMIN B12     Status: Normal   Collection Time   09/01/11  9:45 AM      Component Value Range Comment   Vitamin B-12 648  211 - 911 (pg/mL)   FOLATE     Status: Normal   Collection Time   09/01/11  9:45 AM      Component Value Range Comment   Folate >20.0     IRON AND TIBC     Status: Normal   Collection Time   09/01/11  9:45 AM      Component Value Range Comment   Iron 62  42 - 135 (ug/dL)    TIBC 161  096 - 045 (ug/dL)    Saturation Ratios 20  20 - 55 (%)    UIBC 243  125 - 400 (ug/dL)   FERRITIN     Status: Normal   Collection Time   09/01/11  9:45 AM      Component Value Range Comment   Ferritin 67  10 - 291 (ng/mL)   SODIUM, URINE, RANDOM      Status: Normal   Collection Time   09/01/11  5:10 PM      Component Value Range Comment   Sodium, Ur 41     CREATININE, URINE, RANDOM     Status: Normal   Collection Time   09/01/11  5:10 PM      Component Value Range Comment   Creatinine, Urine 28.0     OSMOLALITY, URINE     Status: Abnormal   Collection Time   09/01/11  5:10 PM      Component Value Range Comment   Osmolality, Ur 174 (*) 390 - 1090 (mOsm/kg)   BASIC METABOLIC PANEL     Status: Abnormal   Collection Time   09/01/11 10:54 PM      Component Value Range Comment   Sodium 126 (*) 135 - 145 (mEq/L)    Potassium 3.8  3.5 - 5.1 (mEq/L)    Chloride 93 (*) 96 - 112 (mEq/L)    CO2 24  19 - 32 (mEq/L)    Glucose, Bld 150 (*)  70 - 99 (mg/dL)    BUN 9  6 - 23 (mg/dL)    Creatinine, Ser 4.54  0.50 - 1.10 (mg/dL)    Calcium 9.1  8.4 - 10.5 (mg/dL)    GFR calc non Af Amer 56 (*) >90 (mL/min)    GFR calc Af Amer 65 (*) >90 (mL/min)   CARDIAC PANEL(CRET KIN+CKTOT+MB+TROPI)     Status: Abnormal   Collection Time   09/01/11 10:54 PM      Component Value Range Comment   Total CK 326 (*) 7 - 177 (U/L)    CK, MB 23.4 (*) 0.3 - 4.0 (ng/mL)    Troponin I 7.42 (*) <0.30 (ng/mL)    Relative Index 7.2 (*) 0.0 - 2.5    BASIC METABOLIC PANEL     Status: Abnormal   Collection Time   09/02/11  7:13 AM      Component Value Range Comment   Sodium 126 (*) 135 - 145 (mEq/L)    Potassium 3.9  3.5 - 5.1 (mEq/L)    Chloride 94 (*) 96 - 112 (mEq/L)    CO2 24  19 - 32 (mEq/L)    Glucose, Bld 110 (*) 70 - 99 (mg/dL)    BUN 7  6 - 23 (mg/dL)    Creatinine, Ser 0.98  0.50 - 1.10 (mg/dL)    Calcium 9.0  8.4 - 10.5 (mg/dL)    GFR calc non Af Amer 57 (*) >90 (mL/min)    GFR calc Af Amer 66 (*) >90 (mL/min)   CBC     Status: Abnormal   Collection Time   09/02/11  7:13 AM      Component Value Range Comment   WBC 7.9  4.0 - 10.5 (K/uL)    RBC 3.73 (*) 3.87 - 5.11 (MIL/uL)    Hemoglobin 11.7 (*) 12.0 - 15.0 (g/dL)    HCT 11.9 (*) 14.7 - 46.0  (%)    MCV 89.8  78.0 - 100.0 (fL)    MCH 31.4  26.0 - 34.0 (pg)    MCHC 34.9  30.0 - 36.0 (g/dL)    RDW 82.9  56.2 - 13.0 (%)    Platelets 369  150 - 400 (K/uL)   CARDIAC PANEL(CRET KIN+CKTOT+MB+TROPI)     Status: Abnormal   Collection Time   09/02/11  7:13 AM      Component Value Range Comment   Total CK 363 (*) 7 - 177 (U/L)    CK, MB 27.7 (*) 0.3 - 4.0 (ng/mL) CRITICAL VALUE NOTED.  VALUE IS CONSISTENT WITH PREVIOUSLY REPORTED AND CALLED VALUE.   Troponin I 10.32 (*) <0.30 (ng/mL)    Relative Index 7.6 (*) 0.0 - 2.5    APTT     Status: Normal   Collection Time   09/02/11 11:50 AM      Component Value Range Comment   aPTT 36  24 - 37 (seconds)   PROTIME-INR     Status: Normal   Collection Time   09/02/11 11:50 AM      Component Value Range Comment   Prothrombin Time 15.1  11.6 - 15.2 (seconds)    INR 1.17  0.00 - 1.49    CARDIAC PANEL(CRET KIN+CKTOT+MB+TROPI)     Status: Abnormal   Collection Time   09/02/11  2:45 PM      Component Value Range Comment   Total CK 322 (*) 7 - 177 (U/L)    CK, MB 20.1 (*) 0.3 - 4.0 (ng/mL) CRITICAL VALUE NOTED.  VALUE IS CONSISTENT WITH PREVIOUSLY REPORTED AND CALLED VALUE.   Troponin I 8.21 (*) <0.30 (ng/mL)    Relative Index 6.2 (*) 0.0 - 2.5    HEPARIN LEVEL     Status: Normal   Collection Time   09/02/11  7:10 PM      Component Value Range Comment   Heparin Unfractionated 0.43  0.30 - 0.70 (IU/mL)   BASIC METABOLIC PANEL     Status: Abnormal   Collection Time   09/03/11  4:38 AM      Component Value Range Comment   Sodium 125 (*) 135 - 145 (mEq/L)    Potassium 3.4 (*) 3.5 - 5.1 (mEq/L)    Chloride 91 (*) 96 - 112 (mEq/L)    CO2 26  19 - 32 (mEq/L)    Glucose, Bld 115 (*) 70 - 99 (mg/dL)    BUN 9  6 - 23 (mg/dL)    Creatinine, Ser 1.61  0.50 - 1.10 (mg/dL)    Calcium 8.3 (*) 8.4 - 10.5 (mg/dL)    GFR calc non Af Amer 50 (*) >90 (mL/min)    GFR calc Af Amer 58 (*) >90 (mL/min)   CBC     Status: Abnormal   Collection Time    09/03/11  4:38 AM      Component Value Range Comment   WBC 10.6 (*) 4.0 - 10.5 (K/uL)    RBC 3.47 (*) 3.87 - 5.11 (MIL/uL)    Hemoglobin 10.7 (*) 12.0 - 15.0 (g/dL)    HCT 09.6 (*) 04.5 - 46.0 (%)    MCV 89.9  78.0 - 100.0 (fL)    MCH 30.8  26.0 - 34.0 (pg)    MCHC 34.3  30.0 - 36.0 (g/dL)    RDW 40.9  81.1 - 91.4 (%)    Platelets 335  150 - 400 (K/uL)   HEPARIN LEVEL     Status: Normal   Collection Time   09/03/11  4:38 AM      Component Value Range Comment   Heparin Unfractionated 0.53  0.30 - 0.70 (IU/mL)     Dg Chest Portable 1 View  09/02/2011  *RADIOLOGY REPORT*  Clinical Data: Short of breath.  Possible congestive heart failure.  PORTABLE CHEST - 1 VIEW  Comparison: 04/19/2010  Findings: Artifact overlies chest.  Heart size is normal.  There is a poor inspiration.  Allowing for that, the lungs are clear the vascularity is normal.  No effusions.  No focal findings.  IMPRESSION: Poor inspiration.  No active disease.  Original Report Authenticated By: Thomasenia Sales, M.D.    Assessment/Plan Patient Active Hospital Problem List: NSTEMI (non-ST elevated myocardial infarction) (09/02/2011) Continue ASA, plavix, beta blocker and heparin; continue statin; she will need cath. May need to delay due to hyponatremia; will review with interventional colleagues. Low back pain (08/29/2011) Management per primary care. UTI (lower urinary tract infection) (08/29/2011) Continue antibiotics Hyponatremia (09/01/2011) Etiology unclear; fluid restrict; follow NA.   Olga Millers MD 09/03/2011, 6:33 AM

## 2011-09-03 NOTE — Consult Note (Signed)
ANTICOAGULATION CONSULT NOTE - Follow Up  Pharmacy Consult for Heparin Indication: NSTEMI  No Known Allergies  Patient Measurements: Height: 5\' 7"  (170.2 cm) Weight: 157 lb 8 oz (71.442 kg) IBW/kg (Calculated) : 61.6    Vital Signs: Temp: 98.1 F (36.7 C) (11/13 0451) Temp src: Oral (11/13 0451) BP: 124/69 mmHg (11/13 0451) Pulse Rate: 75  (11/13 0451)  Current anticoagulants: Aspirin 325mg  po daily Plavix 300mg  po (11/12), 75mg  po daily IV Heparin 1100 units/hr  Labs:  Basename 09/03/11 0438 09/02/11 1910 09/02/11 1445 09/02/11 1150 09/02/11 0713 09/01/11 2254 09/01/11 0448  HGB 10.7* -- -- -- 11.7* -- --  HCT 31.2* -- -- -- 33.5* -- 32.6*  PLT 335 -- -- -- 369 -- 370  APTT -- -- -- 36 -- -- --  LABPROT -- -- -- 15.1 -- -- --  INR -- -- -- 1.17 -- -- --  HEPARINUNFRC 0.53 0.43 -- -- -- -- --  CREATININE 0.98 -- -- -- 0.87 0.89 --  CKTOTAL -- -- 322* -- 363* 326* --  CKMB -- -- 20.1* -- 27.7* 23.4* --  TROPONINI -- -- 8.21* -- 10.32* 7.42* --   Estimated Creatinine Clearance: 37.8 ml/min (by C-G formula based on Cr of 0.98).   Assessment: 77 yo female on IV heparin for NSTEMI, heparin level = 0.53 (in goal range j0.3-0.7) on 1100 units/hr.  Plan is for cath, possible delay due to hyponatremia.  Goal of Therapy:  Heparin level 0.3-0.7 units/ml   Plan:  Will continue heparin at same rate with a daily CBC and Heparin level.  Loralee Pacas R 09/03/2011,7:15 AM

## 2011-09-03 NOTE — Op Note (Signed)
Cardiac Catheterization Procedure Note  Name: Becky Morales MRN: [id number] DOB: [birth year]  Procedure: Left Heart Cath, Selective Coronary Angiography, LV angiography,  PTCA/Stent of Obtuse marginal vessel.  Indication: 77 year old black female who presents with a non-ST elevation myocardial infarction.   Diagnostic Procedure Details: The right groin was prepped, draped, and anesthetized with 1% lidocaine. Using the modified Seldinger technique, a 5 French sheath was introduced into the right femoral artery. Standard Judkins catheters were used for selective coronary angiography and left ventriculography. Catheter exchanges were performed over a wire.  The diagnostic procedure was well-tolerated without immediate complications.  PROCEDURAL FINDINGS Hemodynamics: AO 123/62 with a mean of 92 mmHg LV 124/19  Coronary angiography: Coronary dominance: right  Left mainstem: Normal  Left anterior descending (LAD): 40-50% disease in the proximal LAD. There is a very small diagonal branch which has 80-90% stenosis proximally.  Left circumflex (LCx): This is a large vessel. It gives rise to a large obtuse marginal vessel that bifurcates immediately into 2 moderate size vessels. The more anterior vessel is without significant disease. The more lateral branch has a 99% stenosis with TIMI grade 1 flow. The mid to distal circumflex has a segmental 40% stenosis.  Right coronary artery (RCA): There is an 80% ostial stenosis. The mid-vessel is diffusely diseased up to 50-70%.  Left ventriculography: Left ventricular systolic function demonstrates mid to distal inferior wall akinesis., LVEF is estimated at 55 %, there is no significant mitral regurgitation   PCI Procedure Note:  Following the diagnostic procedure, the decision was made to proceed with PCI. The sheath was upsized to a 6 [place]. Weight-based bivalirudin was given for anticoagulation. The patient had already been loaded with Plavix. Once a  therapeutic ACT was achieved, a 6 [place] F L4 guide catheter was inserted.  A pro-water coronary guidewire was used to cross the lesion.  The lesion was predilated with a 2.5 x 12 emerge balloon.  The lesion was then stented with a 2.5 x 16 express stent.  The stent was postdilated with a 2.5 noncompliant balloon to 14 atmospheres.  Following PCI, there was 0% residual stenosis and TIMI-3 flow. Final angiography confirmed an excellent result. Femoral hemostasis was achieved with manual compression.  The patient tolerated the PCI procedure well. There were no immediate procedural complications.  The patient was transferred to the post catheterization recovery area for further monitoring.  PCI Data: Vessel - obtuse marginal vessel/Segment - lateral branch Percent Stenosis (pre)  99% TIMI-flow 1 Stent 2.5 x 16 mm express Percent Stenosis (post) 0% TIMI-flow (post) 3  Final Conclusions:    1. Three-vessel obstructive coronary disease. The culprit lesion is in a moderate sized obtuse marginal vessel. There is also severe disease of a very small diagonal branch. There is moderate to severe disease at the ostium of the right coronary.  2. Overall good left ventricular function with inferior wall motion abnormality.   3. Successful and coronary stenting of the obtuse marginal vessel with a bare-metal stent.  Recommendations: Continue aspirin and Plavix. I would recommend treating her other coronary disease medically.  Drexler Maland [place] 09/03/2011, 7:32 PM

## 2011-09-03 NOTE — Progress Notes (Signed)
2D Echo Completed.    Becky Morales 09/03/11. 5:13pm

## 2011-09-03 NOTE — Progress Notes (Signed)
Occupational Therapy Treatment Patient Details Name: Becky Morales MRN: [id number] DOB: Feb 08, 1920 Today's Date: 09/03/2011  OT Assessment/Plan OT Assessment/Plan OT Plan: Discharge plan remains appropriate OT Frequency: Min 2X/week Follow Up Recommendations: None Equipment Recommended: 3 in 1 bedside comode OT Goals ADL Goals ADL Goal: Grooming - Progress: Progressing toward goals ADL Goal: Upper Body Bathing - Progress: Progressing toward goals ADL Goal: Lower Body Bathing - Progress: Progressing toward goals ADL Goal: Upper Body Dressing - Progress: Progressing toward goals ADL Goal: Lower Body Dressing - Progress: Progressing toward goals ADL Goal: Toilet Transfer - Progress: Progressing toward goals ADL Goal: Toileting - Clothing Manipulation - Progress: Progressing toward goals ADL Goal: Toileting - Hygiene - Progress: Progressing toward goals ADL Goal: Tub/Shower Transfer - Progress: Progressing toward goals  OT Treatment Precautions/Restrictions  Precautions Precautions: Fall Restrictions Weight Bearing Restrictions: No   ADL ADL Grooming: Simulated;Supervision/safety Where Assessed - Grooming: Standing at sink Lower Body Bathing: Simulated;Supervision/safety Where Assessed - Lower Body Bathing: Sit to stand from bed;Sit to stand from chair Lower Body Dressing: Supervision/safety;Simulated Where Assessed - Lower Body Dressing: Sit to stand from chair;Sit to stand from bed Toilet Transfer: Supervision/safety Toilet Transfer Method: Proofreader: Raised toilet seat with arms (or 3-in-1 over toilet) Toileting - Clothing Manipulation: Supervision/safety Where Assessed - Toileting Clothing Manipulation: Sit to stand from 3-in-1 or toilet Tub/Shower Transfer: Simulated;Supervision/safety (Simulated stepping over tub with RW) Equipment Used: Rolling walker ADL Comments: Pt. performed static and dynamic standing activities for 10 mins to simulate  ADLs.  Performed with supervision and no LOB, and no dyspnea.  Pt. ambulated on unit >200 ft. per her request with supervision Mobility  Bed Mobility Bed Mobility: Yes Rolling Right: 7: Independent Supine to Sit: 7: Independent Transfers Transfers: Yes Sit to Stand: 5: Supervision Stand to Sit: 5: Supervision Exercises    End of Session OT - End of Session Equipment Utilized During Treatment:  (RW) Activity Tolerance: Patient tolerated treatment well Patient left: in bed;with call bell in reach;with family/visitor present Nurse Communication: Mobility status for transfers (O2 sats with activity) General Behavior During Session: [facility] for tasks performed Cognition: [facility] for tasks performed  Margrett Kalb M  09/03/2011, 4:44 PM

## 2011-09-04 ENCOUNTER — Other Ambulatory Visit: Payer: Self-pay

## 2011-09-04 LAB — BASIC METABOLIC PANEL
BUN: 11 mg/dL (ref 6–23)
Creatinine, Ser: 0.92 mg/dL (ref 0.50–1.10)
GFR calc Af Amer: 62 mL/min — ABNORMAL LOW (ref >90)
GFR calc non Af Amer: 54 mL/min — ABNORMAL LOW (ref >90)

## 2011-09-04 LAB — CBC
HCT: 33.2 % — ABNORMAL LOW (ref 36.0–46.0)
MCHC: 35.2 g/dL (ref 30.0–36.0)
RDW: 14.5 % (ref 11.5–15.5)

## 2011-09-04 MED ORDER — ROSUVASTATIN CALCIUM 40 MG PO TABS
40.0000 mg | ORAL_TABLET | Freq: Every day | ORAL | Status: DC
  Administered 2011-09-04 – 2011-09-05 (×2): 40 mg via ORAL
  Filled 2011-09-04 (×2): qty 1

## 2011-09-04 MED ORDER — METOPROLOL SUCCINATE ER 25 MG PO TB24
25.0000 mg | ORAL_TABLET | Freq: Every day | ORAL | Status: DC
  Administered 2011-09-04 – 2011-09-05 (×2): 25 mg via ORAL
  Filled 2011-09-04 (×2): qty 1

## 2011-09-04 MED ORDER — ACETAMINOPHEN 325 MG PO TABS
650.0000 mg | ORAL_TABLET | Freq: Four times a day (QID) | ORAL | Status: DC | PRN
  Administered 2011-09-04: 650 mg via ORAL
  Filled 2011-09-04: qty 2

## 2011-09-04 NOTE — Progress Notes (Signed)
Physical Therapy Treatment Patient Details Name: Becky Morales MRN: [id number] DOB: [birth year] Today's Date: 09/04/2011  PT Assessment/Plan  PT - Assessment/Plan PT Plan: Discharge plan remains appropriate Follow Up Recommendations: Home health PT Equipment Recommended:  (pt received RW for use on bad days) PT Goals  Acute Rehab PT Goals Pt will Roll Supine to Right Side:  ( ) PT Goal: Rolling Supine to Right Side - Progress: Other (comment) (not addressed today) PT Goal: Supine/Side to Sit - Progress: Progressing toward goal Pt will Transfer Sit to Stand/Stand to Sit: with modified independence PT Transfer Goal: Sit to Stand/Stand to Sit - Progress: Met PT Goal: Ambulate - Progress: Met PT Goal: Up/Down Stairs - Progress: Met PT Goal: Perform Home Exercise Program - Progress:  (not addressed)  PT Treatment Precautions/Restrictions  Precautions Precautions: Fall Precaution Comments: fall risk 2* reports legs buckle when feeling weak Required Braces or Orthoses: No Restrictions Weight Bearing Restrictions: No Mobility (including Balance) Bed Mobility Bed Mobility: No Rolling Right: Not tested (comment) Right Sidelying to Sit: Not tested (comment) Supine to Sit: Not tested (comment) Transfers Transfers: Yes Sit to Stand: From bed;6: Modified independent (Device/Increase time) ( ) Stand to Sit: 6: Modified independent (Device/Increase time) ( ) Stand to Sit Details: no cuing needed Ambulation/Gait Ambulation/Gait: Yes Ambulation/Gait Assistance: 6: Modified independent (Device/Increase time) Ambulation/Gait Assistance Details (indicate cue type and reason):  (no cuing; used cane appropriately) Ambulation Distance (Feet): 200 Feet Assistive device: Straight cane;Rolling walker (does not need the RW presently; safe with standard cane) Gait Pattern:  (mildly tentative with gait, but safe) Gait velocity: slower with little ability to significantly increase velocity Stairs:  Yes Stairs Assistance: 6: Modified independent (Device/Increase time) Stair Management Technique: Step to pattern Number of Stairs: 5   Balance Balance Assessed: Yes (generally stable as long as she uses cane) Exercise    End of Session PT - End of Session Activity Tolerance: Patient tolerated treatment well Patient left: in chair Nurse Communication: Mobility status for ambulation General Behavior During Session: [facility] for tasks performed Cognition: [facility] for tasks performed  Marianita Botkin, Eliseo Gum 09/04/2011, 2:29 PM  09/04/2011  Barrera Bing, PT [phone] (pager)

## 2011-09-04 NOTE — Progress Notes (Signed)
CARDIAC REHAB PHASE I   PRE:  Rate/Rhythm: 80 SR   BP:  Supine: 120/50  Sitting:   Standing:   SaO2:   MODE:  Ambulation: 340 ft   POST:  Rate/Rhythem: 100 SR  BP:  Supine:   Sitting: 126/50  Standing:    SaO2:  [phone] Assisted times one and used walker to ambulate. Gait steady with walker. Pt. Without c/o during walk other than sore groin. Completed discharge education with pt. She agrees to [employer]. CRP in [place], will send referral. To chair after walk with call light in reach  Beatrix Fetters

## 2011-09-04 NOTE — Progress Notes (Signed)
[facility]  SUBJECTIVE:Doing well this am.  Walking with cardiac rehab.  Back is ok.  No chest pain. Labs not done this morning.    Filed Vitals:   09/04/11 0029 09/04/11 0100 09/04/11 0200 09/04/11 0706  BP: 120/54 112/57 93/42 117/46  Pulse: 91  65 85  Temp: 98.3 F (36.8 C)   98 F (36.7 C)  TempSrc: Oral   Oral  Resp: 29 21  22   Height:      Weight:      SpO2: 99%   97%    Intake/Output Summary (Last 24 hours) at 09/04/11 0834 Last data filed at 09/03/11 1500  Gross per 24 hour  Intake 580.75 ml  Output      0 ml  Net 580.75 ml    LABS: Basic Metabolic Panel:  Basename 09/03/11 1530 09/03/11 0438  NA 125* 125*  K 4.0 3.4*  CL 93* 91*  CO2 25 26  GLUCOSE 104* 115*  BUN 10 9  CREATININE 1.00 0.98  CALCIUM 8.7 8.3*  MG -- --  PHOS -- --   Liver Function Tests: No results found for this basename: AST:2,ALT:2,ALKPHOS:2,BILITOT:2,PROT:2,ALBUMIN:2 in the last 72 hours No results found for this basename: LIPASE:2,AMYLASE:2 in the last 72 hours CBC:  Basename 09/03/11 0438 09/02/11 0713  WBC 10.6* 7.9  NEUTROABS -- --  HGB 10.7* 11.7*  HCT 31.2* 33.5*  MCV 89.9 89.8  PLT 335 369   Cardiac Enzymes:  Basename 09/02/11 1445 09/02/11 0713 09/01/11 2254  CKTOTAL 322* 363* 326*  CKMB 20.1* 27.7* 23.4*  CKMBINDEX -- -- --  TROPONINI 8.21* 10.32* 7.42*   Thyroid Function Tests:  Basename 09/03/11 0438  TSH 2.060  T4TOTAL --  T3FREE --  THYROIDAB --   Anemia Panel:  Basename 09/01/11 0945  VITAMINB12 648  FOLATE >20.0  FERRITIN 67  TIBC 305  IRON 62  RETICCTPCT --    PHYSICAL EXAM General: NAD Neck: No JVD, no thyromegaly or thyroid nodule.  Lungs: Clear to auscultation bilaterally with normal respiratory effort. CV: Nondisplaced PMI.  Heart regular S1/S2, no S3/S4, 2/6 SEM RUSB.  No peripheral edema.  No carotid bruit.  Normal pedal pulses.  Abdomen: Soft, nontender, no hepatosplenomegaly, no distention.  Neurologic: Alert and  oriented x 3.  Psych: Normal affect. Extremities: No clubbing or cyanosis.   ASSESSMENT AND PLAN:  77 yo with lumbar spinal stenosis and back pain, hyponatremia, UTI, and NSTEMI now s/p PCI.   1. NSTEMI: PCI with BMS to OM1.  Has ostial RCA disease that will be managed medically.  EF was 55% on LV-gram. - Continue ASA 81 and Plavix.  - Add statin (Crestor 40) - Change metoprolol to Toprol XL 25 mg daily.  - Doing well with ambulation.  2. Hyponatremia: Labs not done today, awaiting.  She does not appear hypervolemic.  TSH was normal. 3. UTI: on cefuroxime, per [facility]. 4. Dispo: From a cardiac standpoint, she could be discharged today.  Will discuss with hospitalist.   Becky Morales

## 2011-09-04 NOTE — Progress Notes (Signed)
Right groin. Level 0. Becky Morales [name]  

## 2011-09-04 NOTE — Progress Notes (Signed)
Sheath Pull note- 6 Fr sheath removed from RFA @ 2140. Manual pressure applied for 20 minutes. Hemostasis achieved at 2200. Level 0 s/p sheath pull. Distal pulses +2.  Pre-medicated patient with 1mg  IV morphine. Pressure dressing applied to site. Patient tolerated procedure well. Carollee Herter

## 2011-09-04 NOTE — Progress Notes (Signed)
6 Fr. Sheath in RFA. Due to be pulled at 2115. Carollee Herter

## 2011-09-04 NOTE — Progress Notes (Signed)
Subjective: No specific complaints.  Only complained about pain from where the skin patches were removed.  Denies any back pain.  Objective: Vital signs in last 24 hours: Filed Vitals:   09/04/11 0100 09/04/11 0200 09/04/11 0706 09/04/11 0900  BP: 112/57 93/42 117/46   Pulse:  65 85   Temp:   98 F (36.7 C) 98.1 F (36.7 C)  TempSrc:   Oral Oral  Resp: 21  22 18   Height:      Weight:      SpO2:   97% 97%   Weight change:   Intake/Output Summary (Last 24 hours) at 09/04/11 1115 Last data filed at 09/04/11 0900  Gross per 24 hour  Intake 700.75 ml  Output      0 ml  Net 700.75 ml   Physical Exam: General: Awake, Oriented, No acute distress. HEENT: EOMI. Neck: Supple CV: S1 and S2 Lungs: Clear to ascultation bilaterally Abdomen: Soft, Nontender, Nondistended, +bowel sounds. Ext: Good pulses. Trace edema.  Lab Results:  [facility] 09/03/11 1530 09/03/11 0438  NA 125* 125*  K 4.0 3.4*  CL 93* 91*  CO2 25 26  GLUCOSE 104* 115*  BUN 10 9  CREATININE 1.00 0.98  CALCIUM 8.7 8.3*  MG -- --  PHOS -- --   No results found for this basename: AST:2,ALT:2,ALKPHOS:2,BILITOT:2,PROT:2,ALBUMIN:2 in the last 72 hours No results found for this basename: LIPASE:2,AMYLASE:2 in the last 72 hours  Basename 09/03/11 0438 09/02/11 0713  WBC 10.6* 7.9  NEUTROABS -- --  HGB 10.7* 11.7*  HCT 31.2* 33.5*  MCV 89.9 89.8  PLT 335 369    Basename 09/02/11 1445 09/02/11 0713 09/01/11 2254  CKTOTAL 322* 363* 326*  CKMB 20.1* 27.7* 23.4*  CKMBINDEX -- -- --  TROPONINI 8.21* 10.32* 7.42*   No results found for this basename: POCBNP:3 in the last 72 hours No results found for this basename: DDIMER:2 in the last 72 hours No results found for this basename: HGBA1C:2 in the last 72 hours No results found for this basename: CHOL:2,HDL:2,LDLCALC:2,TRIG:2,CHOLHDL:2,LDLDIRECT:2 in the last 72 hours  Basename 09/03/11 0438  TSH 2.060  T4TOTAL --  T3FREE --  THYROIDAB --   No results  found for this basename: VITAMINB12:2,FOLATE:2,FERRITIN:2,TIBC:2,IRON:2,RETICCTPCT:2 in the last 72 hours  Micro Results: Recent Results (from the past 240 hour(s))  URINE CULTURE     Status: Normal   Collection Time   08/29/11 11:57 AM      Component Value Range Status Comment   Specimen Description URINE, CATHETERIZED   Final    Special Requests NONE   Final    Setup Time [id number]   Final    Colony Count >=100,000 COLONIES/ML   Final    Culture ESCHERICHIA COLI   Final    Report Status 08/31/2011 FINAL   Final    Organism ID, Bacteria ESCHERICHIA COLI   Final     Studies/Results: Dg Lumbar Spine 2-3 Views  08/29/2011  *RADIOLOGY REPORT*  Clinical Data: Severe low back pain.  LUMBAR SPINE - 2-3 VIEW 08/29/2011:  Comparison: Lumbar spine MRI 07/26/2010 [facility] [address]  Lumbar spine x-rays 02/05/2010 [facility].  Findings: Lateral image was obtained using cross-table lateral technique.  Five non-rib bearing lumbar vertebrae with anatomic alignment.  No fractures.  Straightening of the usual lumbar lordosis.  Mild disc space narrowing and endplate hypertrophic changes at L2-3, L3-4, L4-5, and L5-S1, unchanged.  Thoracolumbar scoliosis convex left, unchanged.  Posterior element hypertrophy throughout the lumbar spine.  Visualized  sacroiliac joints intact with degenerative changes.  Degenerative changes also noted in both hips.  IMPRESSION: No acute osseous abnormality.  Straightening of the usual lumbar lordosis which may reflect positioning and/or spasm.  Diffuse degenerative disc disease, spondylosis, and facet degenerative changes.  Scoliosis.  Degenerative changes involving the visualized sacroiliac joints and hips.  Original Report Authenticated By: Arnell Sieving, M.D.   Ct Lumbar Spine Wo Contrast  08/29/2011  *RADIOLOGY REPORT*  Clinical Data: 77 year old female with back pain, bilateral leg pain.  Remote history of colon cancer.  CT LUMBAR SPINE WITHOUT  CONTRAST  Technique:  Multidetector CT imaging of the lumbar spine was performed without intravenous contrast administration. Multiplanar CT image reconstructions were also generated.  Comparison: Lumbar radiographs 08/29/2011.  MRI 07/26/2010. CT abdomen and pelvis 07/02/2006.  Findings: Negative visualized abdominal viscera.  Osteopenia.  Normal lumbar segmentation.  Chronic changes to the medial right iliac bone partially visible, likely Paget's disease and stable since 2007. Visualized sacrum appears intact.  Negative SI joints.  Stable lumbar vertebral height and alignment.  Mild scoliosis. No acute osseous abnormality identified.  T12-L1:  Chronic circumferential disc osteophyte complex.  No significant spinal stenosis.  L1-L2:  Chronic partially calcified circumferential disc bulge.  No significant stenosis.  L2-L3:  Chronic advanced disc degeneration with bulky right far lateral disc osteophyte complex.  Moderate facet degeneration. Mild spinal, right lateral recess, right foraminal stenosis.  L3-L4:  Chronic circumferential disc bulge.  Severe facet and ligament flavum hypertrophy.  Severe spinal stenosis and moderate right foraminal stenosis may have increased.  L4-L5:  Chronic circumferential disc osteophyte complex.  Severe ligament flavum and facet hypertrophy. Chronic severe spinal stenosis and mild left foraminal stenosis.  L5-S1:  Chronic very severe facet hypertrophy on the left.  Chronic bulky left far lateral disc osteophyte complex.  Chronic severe left lateral recess stenosis and left foraminal stenosis.  IMPRESSION: 1.  Chronic severe lumbar degenerative changes including severe spinal stenosis at L3-L4 and L4-L5, with stenosis at the former appearing somewhat progressed since 2011. 2.  Chronic Paget's disease of the right hemi pelvis appears stable since 2007. 3. No acute osseous abnormality identified.  Original Report Authenticated By: Harley Hallmark, M.D.   Dg Chest Portable 1  View  09/02/2011  *RADIOLOGY REPORT*  Clinical Data: Short of breath.  Possible congestive heart failure.  PORTABLE CHEST - 1 VIEW  Comparison: 04/19/2010  Findings: Artifact overlies chest.  Heart size is normal.  There is a poor inspiration.  Allowing for that, the lungs are clear the vascularity is normal.  No effusions.  No focal findings.  IMPRESSION: Poor inspiration.  No active disease.  Original Report Authenticated By: Thomasenia Sales, M.D.    Medications: I have reviewed the patient's current medications. Scheduled Meds:   . aspirin  81 mg Oral Daily  . bivalirudin      . cefUROXime  250 mg Oral BID WC  . clopidogrel  75 mg Oral Q breakfast  . docusate sodium  100 mg Oral BID  . fentaNYL      . ferrous sulfate  325 mg Oral Q breakfast  . heparin      . loratadine  10 mg Oral Daily  . metoprolol succinate  25 mg Oral Daily  . midazolam      . multivitamins ther. w/minerals  1 tablet Oral Daily  . nitroGLYCERIN      . pantoprazole  40 mg Oral Q1200  . rosuvastatin  40 mg Oral  Daily  . vitamin A & D      . DISCONTD: aspirin EC  325 mg Oral Daily  . DISCONTD: benzonatate  100 mg Oral TID  . DISCONTD: metoprolol tartrate  25 mg Oral Q8H  . DISCONTD: nitroGLYCERIN  1 inch Topical Q6H  . DISCONTD: rosuvastatin  40 mg Oral Q0600   Continuous Infusions:   . sodium chloride    . DISCONTD: sodium chloride 125 mL/hr at 09/03/11 1315  . DISCONTD: heparin 1,100 Units/hr (09/03/11 0917)   PRN Meds:.acetaminophen, ondansetron (ZOFRAN) IV, polyethylene glycol, zolpidem, DISCONTD: acetaminophen, DISCONTD: acetaminophen, DISCONTD: alum & mag hydroxide-simeth, DISCONTD: hydrALAZINE, DISCONTD: morphine, DISCONTD: ondansetron (ZOFRAN) IV, DISCONTD: ondansetron, DISCONTD: oxyCODONE, DISCONTD: senna-docusate  Assessment/Plan: 1. NSTEMI (non-ST elevated myocardial infarction).  Patient had a cardiac catheter on 09/03/2011, which showed three-vessel obstructive coronary disease. Culprit lesion  is in a moderate sized obtuse marginal vessel. There is also severe disease of a very small diagonal branch. There is moderate to severe disease at the ostium of the right coronary.  Continue aspirin and Plavix.  Will treat her medically for her coronary disease.  2. Low back pain, secondary to spinal stenosis of lumbar region at multiple levels.  Neurosurgery evaluated the patient.  Patient does not want surgery at this time.  Back pain improved at this time.  Continue pain management.  Will arrange for home health PT and OT at the time of discharge.  3. UTI (lower urinary tract infection).  Urine culture from 08/29/2011, growing Escherichia coli, culture results showed pan-sensitivity.  Continue cefuroxime.  4. Hyponatremia.  Etiology unclear at this time.  Continue to monitor.  If the patient continues to have hyponatremia will consider further workup at that time.  Patient's ARB was held.  Patient's random cortisol was 17.4 on 09/03/2011.  Patient's TSH was 2.060 on 09/03/2011.  5. Hypertension stable.  Currently on a beta blocker.  Patient's ARB and hydrochlorothiazide has been held due to hyponatremia.  6. Allergic rhinitis stable.  7. GERD on PPI.  8. Constipation resolved.  9. Disposition.  Pending improvement in sodium in 1-2 days.   LOS: 6 days  Reianna Batdorf A, MD 09/04/2011, 11:15 AM

## 2011-09-05 ENCOUNTER — Encounter: Payer: Medicare Other | Admitting: Rehabilitation

## 2011-09-05 ENCOUNTER — Telehealth: Payer: Self-pay | Admitting: Internal Medicine

## 2011-09-05 LAB — BASIC METABOLIC PANEL
BUN: 11 mg/dL (ref 6–23)
BUN: 9 mg/dL (ref 6–23)
CO2: 24 mEq/L (ref 19–32)
Chloride: 96 mEq/L (ref 96–112)
Creatinine, Ser: 0.87 mg/dL (ref 0.50–1.10)
GFR calc Af Amer: 66 mL/min — ABNORMAL LOW (ref >90)
GFR calc non Af Amer: 49 mL/min — ABNORMAL LOW (ref >90)
Glucose, Bld: 109 mg/dL — ABNORMAL HIGH (ref 70–99)
Glucose, Bld: 124 mg/dL — ABNORMAL HIGH (ref 70–99)
Potassium: 3.9 mEq/L (ref 3.5–5.1)
Potassium: 3.3 mEq/L — ABNORMAL LOW (ref 3.5–5.1)
Sodium: 131 mEq/L — ABNORMAL LOW (ref 135–145)

## 2011-09-05 LAB — CBC
HCT: 30.2 % — ABNORMAL LOW (ref 36.0–46.0)
Hemoglobin: 10.5 g/dL — ABNORMAL LOW (ref 12.0–15.0)
MCH: 31.2 pg (ref 26.0–34.0)
MCHC: 34.8 g/dL (ref 30.0–36.0)
RBC: 3.37 MIL/uL — ABNORMAL LOW (ref 3.87–5.11)

## 2011-09-05 MED ORDER — METOPROLOL SUCCINATE ER 25 MG PO TB24: 25.0000 mg | ORAL_TABLET | Freq: Every day | ORAL | Status: DC

## 2011-09-05 MED ORDER — ASPIRIN 81 MG PO TBEC
81.0000 mg | DELAYED_RELEASE_TABLET | Freq: Every day | ORAL | Status: DC
  Administered 2011-09-05: 81 mg via ORAL
  Filled 2011-09-05: qty 1

## 2011-09-05 MED ORDER — CEFUROXIME AXETIL 250 MG PO TABS: 250.0000 mg | ORAL_TABLET | Freq: Two times a day (BID) | ORAL | Status: AC

## 2011-09-05 MED ORDER — ATORVASTATIN CALCIUM 80 MG PO TABS: 80.0000 mg | ORAL_TABLET | Freq: Every day | ORAL | Status: DC

## 2011-09-05 MED ORDER — POLYETHYLENE GLYCOL 3350 17 G PO PACK: 17.0000 g | PACK | Freq: Every day | ORAL | Status: AC | PRN

## 2011-09-05 MED ORDER — NITROGLYCERIN 0.4 MG SL SUBL: 0.4000 mg | SUBLINGUAL_TABLET | SUBLINGUAL | Status: DC | PRN

## 2011-09-05 MED ORDER — CLOPIDOGREL BISULFATE 75 MG PO TABS: 75.0000 mg | ORAL_TABLET | Freq: Every day | ORAL | Status: DC

## 2011-09-05 MED ORDER — VALSARTAN 80 MG PO TABS: 80.0000 mg | ORAL_TABLET | Freq: Every day | ORAL | Status: DC

## 2011-09-05 NOTE — Progress Notes (Signed)
Physical Therapy Treatment Patient Details Name: Becky Morales MRN: [id number] DOB: 03-24-1920 Today's Date: 09/05/2011  PT Assessment/Plan  PT - Assessment/Plan Comments on Treatment Session: Pt is a very pleasant, independent lady who is very excited about going home today. Given her gait speed, stooped posture and slowness of movement this pt could still benefit from HHPT and I tried to stress this to her that she could gain a lot of balance and safety at home. She is agreeable to using the RW at hom.  PT Plan: Discharge plan remains appropriate PT Goals  Acute Rehab PT Goals PT Goal: Rolling Supine to Right Side - Progress:  (not addressed, pt reports no problem ) PT Goal: Supine/Side to Sit - Progress:  (not addressed) PT Transfer Goal: Sit to Stand/Stand to Sit - Progress: Met Pt will Ambulate: >150 feet;with gait velocity >(comment) ft/second (gait velocity >/= 1.8 ft/sec) PT Goal: Ambulate - Progress: Revised (modified due to goal met) PT Goal: Up/Down Stairs - Progress: Met PT Goal: Perform Home Exercise Program - Progress:  (not addressed )  PT Treatment Precautions/Restrictions  Precautions Precautions: Fall Precaution Comments: fall risk 2* reports legs buckle when feeling weak Required Braces or Orthoses: No Restrictions Weight Bearing Restrictions: No Mobility (including Balance) Bed Mobility Bed Mobility: No Transfers Sit to Stand: 6: Modified independent (Device/Increase time) Stand to Sit: 6: Modified independent (Device/Increase time) Ambulation/Gait Ambulation/Gait Assistance: 5: Supervision;6: Modified independent (Device/Increase time) Ambulation/Gait Assistance Details (indicate cue type and reason): pt attempted ambulating with SPC but moving very slowly with supervision; gave pt the RW and she was able to ambulate modI with gait speed 1.22 ft/sec still putting her at a risk of falls as anyone who walks with gait speed </=1.8 ft/sec is at risk for recurrent  falls; pt stilll amb with flexed posture Ambulation Distance (Feet): 400 Feet Assistive device: Rolling walker;Straight cane Gait Pattern: Trunk flexed Stairs: Yes Stairs Assistance: 5: Supervision Stairs Assistance Details (indicate cue type and reason): v/c's for technique Stair Management Technique: One rail Right;With cane Number of Stairs: 4   Balance Balance Assessed: Yes Dynamic Standing Balance Dynamic Standing - Comments: Pt presents standing independently in room. When initiating gait patient with very stooped posture and shuffled feet. Supervision at most but would still benefit from further therapies for balance.  Exercise    End of Session PT - End of Session Equipment Utilized During Treatment: Gait belt Activity Tolerance: Patient tolerated treatment well Patient left: in chair General Behavior During Session: [facility] for tasks performed Cognition: Impaired (awareness of balance deficit)  Morales,Becky Larrivee HELEN 09/05/2011, 1:04 PM

## 2011-09-05 NOTE — Telephone Encounter (Signed)
Was discharged today and was told that she will need a hosp fup with Dr Cato Mulligan within a week. Please advise of where to work in ? Thanks.

## 2011-09-05 NOTE — Progress Notes (Signed)
Subjective: No specific complaints.  Only complained about pain from where the skin patches were removed.  Denies any back pain.  Objective: Vital signs in last 24 hours: Filed Vitals:   09/04/11 1640 09/04/11 1650 09/04/11 2100 09/05/11 0500  BP: 119/45  125/50 122/68  Pulse:   77 64  Temp:  97.6 F (36.4 C) 97.8 F (36.6 C) 97.5 F (36.4 C)  TempSrc:   Oral Oral  Resp:  20 18 18   Height:      Weight:      SpO2:  100%  98%   Weight change:  No intake or output data in the 24 hours ending 09/05/11 0945 Physical Exam: General: Awake, Oriented, No acute distress. HEENT: EOMI. Neck: Supple CV: S1 and S2 Lungs: Clear to ascultation bilaterally Abdomen: Soft, Nontender, Nondistended, +bowel sounds. Ext: Good pulses. Trace edema.  Lab Results:  Basename 09/05/11 0700 09/04/11 1116  NA 131* 126*  K 3.9 3.8  CL 97 94*  CO2 24 24  GLUCOSE 109* 159*  BUN 11 11  CREATININE 0.99 0.92  CALCIUM 9.3 9.3  MG -- --  PHOS -- --   No results found for this basename: AST:2,ALT:2,ALKPHOS:2,BILITOT:2,PROT:2,ALBUMIN:2 in the last 72 hours No results found for this basename: LIPASE:2,AMYLASE:2 in the last 72 hours  Basename 09/05/11 0700 09/04/11 1116  WBC 6.1 10.2  NEUTROABS -- --  HGB 10.5* 11.7*  HCT 30.2* 33.2*  MCV 89.6 90.2  PLT 371 416*    Basename 09/02/11 1445  CKTOTAL 322*  CKMB 20.1*  CKMBINDEX --  TROPONINI 8.21*   No results found for this basename: POCBNP:3 in the last 72 hours No results found for this basename: DDIMER:2 in the last 72 hours No results found for this basename: HGBA1C:2 in the last 72 hours No results found for this basename: CHOL:2,HDL:2,LDLCALC:2,TRIG:2,CHOLHDL:2,LDLDIRECT:2 in the last 72 hours  Basename 09/03/11 0438  TSH 2.060  T4TOTAL --  T3FREE --  THYROIDAB --   No results found for this basename: VITAMINB12:2,FOLATE:2,FERRITIN:2,TIBC:2,IRON:2,RETICCTPCT:2 in the last 72 hours  Micro Results: Recent Results (from the past 240  hour(s))  URINE CULTURE     Status: Normal   Collection Time   08/29/11 11:57 AM      Component Value Range Status Comment   Specimen Description URINE, CATHETERIZED   Final    Special Requests NONE   Final    Setup Time [id number]   Final    Colony Count >=100,000 COLONIES/ML   Final    Culture ESCHERICHIA COLI   Final    Report Status 08/31/2011 FINAL   Final    Organism ID, Bacteria ESCHERICHIA COLI   Final     Studies/Results: Dg Lumbar Spine 2-3 Views  08/29/2011  *RADIOLOGY REPORT*  Clinical Data: Severe low back pain.  LUMBAR SPINE - 2-3 VIEW 08/29/2011:  Comparison: Lumbar spine MRI 07/26/2010 [facility] [address].  Lumbar spine x-rays 02/05/2010 [facility].  Findings: Lateral image was obtained using cross-table lateral technique.  Five non-rib bearing lumbar vertebrae with anatomic alignment.  No fractures.  Straightening of the usual lumbar lordosis.  Mild disc space narrowing and endplate hypertrophic changes at L2-3, L3-4, L4-5, and L5-S1, unchanged.  Thoracolumbar scoliosis convex left, unchanged.  Posterior element hypertrophy throughout the lumbar spine.  Visualized sacroiliac joints intact with degenerative changes.  Degenerative changes also noted in both hips.  IMPRESSION: No acute osseous abnormality.  Straightening of the usual lumbar lordosis which may reflect positioning and/or spasm.  Diffuse degenerative  disc disease, spondylosis, and facet degenerative changes.  Scoliosis.  Degenerative changes involving the visualized sacroiliac joints and hips.  Original Report Authenticated By: Arnell Sieving, M.D.   Ct Lumbar Spine Wo Contrast  08/29/2011  *RADIOLOGY REPORT*  Clinical Data: 77 year old female with back pain, bilateral leg pain.  Remote history of colon cancer.  CT LUMBAR SPINE WITHOUT CONTRAST  Technique:  Multidetector CT imaging of the lumbar spine was performed without intravenous contrast administration. Multiplanar CT image  reconstructions were also generated.  Comparison: Lumbar radiographs 08/29/2011.  MRI 07/26/2010. CT abdomen and pelvis 07/02/2006.  Findings: Negative visualized abdominal viscera.  Osteopenia.  Normal lumbar segmentation.  Chronic changes to the medial right iliac bone partially visible, likely Paget's disease and stable since 2007. Visualized sacrum appears intact.  Negative SI joints.  Stable lumbar vertebral height and alignment.  Mild scoliosis. No acute osseous abnormality identified.  T12-L1:  Chronic circumferential disc osteophyte complex.  No significant spinal stenosis.  L1-L2:  Chronic partially calcified circumferential disc bulge.  No significant stenosis.  L2-L3:  Chronic advanced disc degeneration with bulky right far lateral disc osteophyte complex.  Moderate facet degeneration. Mild spinal, right lateral recess, right foraminal stenosis.  L3-L4:  Chronic circumferential disc bulge.  Severe facet and ligament flavum hypertrophy.  Severe spinal stenosis and moderate right foraminal stenosis may have increased.  L4-L5:  Chronic circumferential disc osteophyte complex.  Severe ligament flavum and facet hypertrophy. Chronic severe spinal stenosis and mild left foraminal stenosis.  L5-S1:  Chronic very severe facet hypertrophy on the left.  Chronic bulky left far lateral disc osteophyte complex.  Chronic severe left lateral recess stenosis and left foraminal stenosis.  IMPRESSION: 1.  Chronic severe lumbar degenerative changes including severe spinal stenosis at L3-L4 and L4-L5, with stenosis at the former appearing somewhat progressed since 2011. 2.  Chronic Paget's disease of the right hemi pelvis appears stable since 2007. 3. No acute osseous abnormality identified.  Original Report Authenticated By: Harley Hallmark, M.D.   Dg Chest Portable 1 View  09/02/2011  *RADIOLOGY REPORT*  Clinical Data: Short of breath.  Possible congestive heart failure.  PORTABLE CHEST - 1 VIEW  Comparison: 04/19/2010   Findings: Artifact overlies chest.  Heart size is normal.  There is a poor inspiration.  Allowing for that, the lungs are clear the vascularity is normal.  No effusions.  No focal findings.  IMPRESSION: Poor inspiration.  No active disease.  Original Report Authenticated By: Thomasenia Sales, M.D.    Medications: I have reviewed the patient's current medications. Scheduled Meds:    . aspirin  81 mg Oral Daily  . cefUROXime  250 mg Oral BID WC  . clopidogrel  75 mg Oral Q breakfast  . docusate sodium  100 mg Oral BID  . ferrous sulfate  325 mg Oral Q breakfast  . loratadine  10 mg Oral Daily  . metoprolol succinate  25 mg Oral Daily  . multivitamins ther. w/minerals  1 tablet Oral Daily  . pantoprazole  40 mg Oral Q1200  . rosuvastatin  40 mg Oral Daily  . DISCONTD: aspirin  81 mg Oral Daily   Continuous Infusions:    . DISCONTD: sodium chloride     PRN Meds:.acetaminophen, polyethylene glycol, zolpidem, DISCONTD: acetaminophen, DISCONTD: ondansetron (ZOFRAN) IV  Assessment/Plan: 1. NSTEMI (non-ST elevated myocardial infarction).  Patient had a cardiac catheter on 09/03/2011, which showed three-vessel obstructive coronary disease. Culprit lesion is in a moderate sized obtuse marginal vessel. There is  also severe disease of a very small diagonal branch. There is moderate to severe disease at the ostium of the right coronary.  Continue aspirin and Plavix.  Will treat her medically for her coronary disease.  2. Low back pain, secondary to spinal stenosis of lumbar region at multiple levels.  Neurosurgery evaluated the patient.  Patient does not want surgery at this time.  Back pain improved at this time.  Continue pain management.  Will arrange for home health PT and OT at the time of discharge.  3. UTI (lower urinary tract infection).  Urine culture from 08/29/2011, growing Escherichia coli, culture results showed pan-sensitivity.  Continue cefuroxime.  4. Hyponatremia.  Improved. Etiology  unclear at this time.  Patient's ARB was held during the hospitalization.  Patient's random cortisol was 17.4 on 09/03/2011.  Patient's TSH was 2.060 on 09/03/2011. Will need outpatient followup with PCP in 1 week. Resume ARB at discharge.  5. Hypertension stable.  Currently on a beta blocker.  Patient's ARB and hydrochlorothiazide has been held due to hyponatremia. Resume ARB at discharge.  6. Allergic rhinitis stable.  7. GERD on PPI.  8. Constipation resolved.  9. Disposition.  Discharge home with home health.   LOS: 7 days  Kadee Philyaw A, MD 09/05/2011, 9:45 AM

## 2011-09-05 NOTE — Progress Notes (Signed)
[facility]  SUBJECTIVE:Doing well this am.  Has been walking with cardiac rehab.  Back is ok.  No chest pain.    Filed Vitals:   09/04/11 1640 09/04/11 1650 09/04/11 2100 09/05/11 0500  BP: 119/45  125/50 122/68  Pulse:   77 64  Temp:  97.6 F (36.4 C) 97.8 F (36.6 C) 97.5 F (36.4 C)  TempSrc:   Oral Oral  Resp:  20 18 18   Height:      Weight:      SpO2:  100%  98%    Intake/Output Summary (Last 24 hours) at 09/05/11 0825 Last data filed at 09/04/11 0900  Gross per 24 hour  Intake    240 ml  Output      0 ml  Net    240 ml    LABS: Basic Metabolic Panel:  Basename 09/05/11 0700 09/04/11 1116  NA 131* 126*  K 3.9 3.8  CL 97 94*  CO2 24 24  GLUCOSE 109* 159*  BUN 11 11  CREATININE 0.99 0.92  CALCIUM 9.3 9.3  MG -- --  PHOS -- --   Liver Function Tests: No results found for this basename: AST:2,ALT:2,ALKPHOS:2,BILITOT:2,PROT:2,ALBUMIN:2 in the last 72 hours No results found for this basename: LIPASE:2,AMYLASE:2 in the last 72 hours CBC:  Basename 09/05/11 0700 09/04/11 1116  WBC 6.1 10.2  NEUTROABS -- --  HGB 10.5* 11.7*  HCT 30.2* 33.2*  MCV 89.6 90.2  PLT 371 416*   Cardiac Enzymes:  Basename 09/02/11 1445  CKTOTAL 322*  CKMB 20.1*  CKMBINDEX --  TROPONINI 8.21*   Thyroid Function Tests:  Basename 09/03/11 0438  TSH 2.060  T4TOTAL --  T3FREE --  THYROIDAB --   Anemia Panel: No results found for this basename: VITAMINB12,FOLATE,FERRITIN,TIBC,IRON,RETICCTPCT in the last 72 hours  PHYSICAL EXAM General: NAD Neck: No JVD, no thyromegaly or thyroid nodule.  Lungs: Clear to auscultation bilaterally with normal respiratory effort. CV: Nondisplaced PMI.  Heart regular S1/S2, no S3/S4, 2/6 SEM RUSB.  No peripheral edema.  No carotid bruit.  Normal pedal pulses.  Abdomen: Soft, nontender, no hepatosplenomegaly, no distention.  Neurologic: Alert and oriented x 3.  Psych: Normal affect. Extremities: No clubbing or cyanosis.    ASSESSMENT AND PLAN:  77 yo with lumbar spinal stenosis and back pain, hyponatremia, UTI, and NSTEMI now s/p PCI.   1. NSTEMI: PCI with BMS to OM1.  Has ostial RCA disease that will be managed medically.  EF was 55% on LV-gram. - Continue ASA 81, statin, Plavix, Toprol XL.  - Add low dose ARB (valsartan 80 mg daily). - Doing well with ambulation, needs home PT.  After completion of home PT, would consider cardiac rehab.  2. Hyponatremia: Improved.  She does not appear hypervolemic.  TSH was normal. 3. UTI: on cefuroxime, per [facility]. 4. Dispo: Should be able to go home today.  - Needs followup with me in 2 wks. - Home PT then cardiac rehab - Cardiac meds for home: ASA 81, Plavix 75, Toprol XL 25, valsartan 80, atorvastatin 80 daily  [employer]

## 2011-09-05 NOTE — Progress Notes (Signed)
D/c orders received, IVs removed with gauze on; pt remains in stable condition; pt meds and instructions reviewed and given to pt and pt family; pt questions answered; pt going home with [facility]

## 2011-09-05 NOTE — Progress Notes (Signed)
Spoke with patient about HHC, she chose [facility] from [facility] list. Carolan Clines at [facility] and requested [facility], and PT.Request placed in [facility]. Patient has a rolling walker, no other equipment recommended. Patient stated that she wishes to change PCP in the near future due to office location. Gave patient phone number for [facility].

## 2011-09-05 NOTE — Progress Notes (Signed)
Occupational Therapy Treatment Patient Details Name: Becky Morales MRN: [id number] DOB: [birth year] Today's Date: 09/05/2011  OT Assessment/Plan OT Assessment/Plan Comments on Treatment Session:  (Pt will have cousin to supervise at home.No HHOT desired. ) OT Plan: Discharge plan remains appropriate OT Frequency: Min 2X/week Follow Up Recommendations: None OT Goals ADL Goals ADL Goal: Grooming - Progress: Progressing toward goals ADL Goal: Upper Body Bathing - Progress: Progressing toward goals ADL Goal: Lower Body Bathing - Progress: Progressing toward goals ADL Goal: Upper Body Dressing - Progress: Progressing toward goals ADL Goal: Lower Body Dressing - Progress: Progressing toward goals ADL Goal: Toilet Transfer - Progress: Progressing toward goals ADL Goal: Toileting - Clothing Manipulation - Progress: Met ADL Goal: Toileting - Hygiene - Progress: Met  OT Treatment Precautions/Restrictions  Precautions Precautions: Fall Restrictions Weight Bearing Restrictions: No   ADL ADL Grooming: Performed;Supervision/safety Where Assessed - Grooming: Standing at sink Upper Body Bathing: Performed;Set up Where Assessed - Upper Body Bathing: Unsupported;Sitting, chair Lower Body Bathing: Performed;Set up Where Assessed - Lower Body Bathing: Sitting, chair;Unsupported;Sit to stand from chair Upper Body Dressing: Performed;Set up Where Assessed - Upper Body Dressing: Standing Lower Body Dressing: Set up;Performed Where Assessed - Lower Body Dressing: Sit to stand from chair;Unsupported;Sitting, chair Toilet Transfer: Performed;Modified independent Toilet Transfer Method: Proofreader: Regular height toilet Toileting - Clothing Manipulation: Independent;Performed Where Assessed - Toileting Clothing Manipulation: Sit to stand from 3-in-1 or toilet Toileting - Hygiene: Performed;Independent Where Assessed - Toileting Hygiene: Sit on 3-in-1 or toilet Equipment  Used: Rolling walker       End of Session OT - End of Session Activity Tolerance: Patient tolerated treatment well Patient left: in chair;with call bell in reach Nurse Communication: Mobility status for transfers General Behavior During Session: [facility] for tasks performed Cognition: [facility] for tasks performed  Evern Bio  09/05/2011, 9:49 AM [phone]

## 2011-09-05 NOTE — Discharge Summary (Addendum)
Discharge Summary  Becky Morales MR#: [id number]  DOB:1920/01/18  Date of Admission: 08/29/2011 Date of Discharge: 09/05/2011  Patient's PCP: Judie Petit, MD, MD  Attending Physician:Morrie Daywalt A  Consults: Treatment Team:  [name], MD (Cardiology)   Discharge Diagnoses: 1. NSTEMI (non-ST elevated myocardial infarction).  2. Low back pain, secondary to spinal stenosis of lumbar region at multiple levels.  3. UTI (lower urinary tract infection). 4. Hyponatremia. Improved. 5. Hypertension stable.  6. Allergic rhinitis stable.  7. GERD on PPI.  8. Constipation.  Brief Admitting History and Physical "Becky Morales is a pleasant 77 year old African American female, history of carcinoid tumor, hypertension, gastroesophageal reflux disease, and a spinal stenosis per CT, who presents to the ED with worsening low back pain and inability to ambulate. She states she was seen by her PCP and had been having back pain for several months and was sent to physical therapy. Patient attended physical therapy last week, and was doing better until 2 days prior to admission. She states that 2 nights ago prior to admission after she went to the bathroom, she tried to sit down and felt that her legs gave away. He denied any bowel incontinence, patient denied any urinary incontinence ,patient denied any saddle anesthesia. Patient stated that she felt better the next day. Night prior to admission patient stated that her legs give away again and was unable to ambulate. Patient also complaining of right hip pain and cramps in the lower extremities. As stated she called her PCPs office spoke to the nurse and was told to come to the ED. And subsequently called EMS and presented to the ED. she denies any fever, no chills, no chest pain, no shortness of breath, no nausea, had one episode of emesis this afternoon and denies any abdominal pain no diarrhea no constipation. Patient denies any tingling in her lower  extremities. She denies any dysuria. She does complain of increased urinary frequency and malodorous urine and also endorses some bilateral lower extremity weakness and also some right hand tingling that has been chronic in nature. Patient was seen in the ED, urinalysis which was done was consistent with a urinary tract infection. Trace of the L-spine were consistent with degenerative disc disease. CT scan of the L-spine showed severe spinal stenosis with progression of disease since 2011. We were called to admit the patient for further evaluation and management. ED PA has a consult to neurosurgery for further evaluation and recommendations."  Discharge Medications  Current Discharge Medication List    START taking these medications   Details  atorvastatin (LIPITOR) 80 MG tablet Take 1 tablet (80 mg total) by mouth daily. Qty: 30 tablet, Refills: 1    cefUROXime (CEFTIN) 250 MG tablet Take 1 tablet (250 mg total) by mouth 2 (two) times daily with a meal. Qty: 6 tablet, Refills: 0    clopidogrel (PLAVIX) 75 MG tablet Take 1 tablet (75 mg total) by mouth daily with breakfast. Qty: 30 tablet, Refills: 1    metoprolol succinate (TOPROL-XL) 25 MG 24 hr tablet Take 1 tablet (25 mg total) by mouth daily. Qty: 30 tablet, Refills: 1    nitroGLYCERIN (NITROSTAT) 0.4 MG SL tablet Place 1 tablet (0.4 mg total) under the tongue every 5 (five) minutes as needed for chest pain. Qty: 30 tablet, Refills: 12    polyethylene glycol (MIRALAX / GLYCOLAX) packet Take 17 g by mouth daily as needed (As needed for constipation). Qty: 14 each, Refills: 0    valsartan (DIOVAN) 80  MG tablet Take 1 tablet (80 mg total) by mouth daily. Qty: 30 tablet, Refills: 1      CONTINUE these medications which have NOT CHANGED   Details  aspirin 81 MG tablet Take 81 mg by mouth daily.     calcium-vitamin D (OSCAL WITH D 500-200) 500-200 MG-UNIT per tablet Take 1 tablet by mouth daily.     cetirizine (ZYRTEC) 10 MG tablet  Take 10 mg by mouth daily.     ferrous sulfate 325 (65 FE) MG tablet Take 325 mg by mouth daily with breakfast.     multivitamin (THERAGRAN) per tablet Take 1 tablet by mouth daily.     omeprazole (PRILOSEC) 20 MG capsule      potassium chloride (K-DUR,KLOR-CON) 10 MEQ tablet        STOP taking these medications     valsartan-hydrochlorothiazide (DIOVAN HCT) 160-12.5 MG per tablet Comments:  Reason for Stopping:          Hospital Course: 1. NSTEMI (non-ST elevated myocardial infarction).  During the course of hospital stay the patient had positive troponin with a peak of 10.32.  Cardiology was consult. Patient had a cardiac catheter on 09/03/2011, which showed three-vessel obstructive coronary disease. Culprit lesion is in a moderate sized obtuse marginal vessel. There is also severe disease of a very small diagonal branch. There is moderate to severe disease at the ostium of the right coronary. Continue aspirin and Plavix.  Cardiology recommended treating the patient medically for her coronary disease.  Patient to follow with cardiology in 2 weeks after discharge.  2. Low back pain, secondary to spinal stenosis of lumbar region at multiple levels. Neurosurgery evaluated the patient. Patient does not want surgery at this time. Back pain improved at this time. Continue pain management. Will arrange for home health PT at the time of discharge.   3. UTI (lower urinary tract infection). Urine culture from 08/29/2011, growing Escherichia coli, culture results showed pan-sensitivity. Continue cefuroxime for 3 more days to complete at least 10 day course of antibiotics.  4. Hyponatremia. Improved. Etiology unclear at this time. Patient's ARB was held during the hospitalization. Patient's random cortisol was 17.4 on 09/03/2011. Patient's TSH was 2.060 on 09/03/2011. Will need outpatient followup with PCP in 1 week for BMET. Resume ARB at discharge.   5. Hypertension stable. Currently on a beta  blocker. Patient's ARB and hydrochlorothiazide has been held due to hyponatremia. Resume ARB at discharge.   6. Allergic rhinitis stable.   7. GERD on PPI.   8. Constipation resolved.   9. Generalized weakness. Discharge home with home health.   Day of Discharge BP 122/68  Pulse 64  Temp(Src) 97.5 F (36.4 C) (Oral)  Resp 18  Ht 5\' 7"  (1.702 m)  Wt 71.442 kg (157 lb 8 oz)  BMI 24.67 kg/m2  SpO2 98%  Results for orders placed during the hospital encounter of 08/29/11 (from the past 48 hour(s))  BASIC METABOLIC PANEL     Status: Abnormal   Collection Time   09/03/11  3:30 PM      Component Value Range Comment   Sodium 125 (*) 135 - 145 (mEq/L)    Potassium 4.0  3.5 - 5.1 (mEq/L)    Chloride 93 (*) 96 - 112 (mEq/L)    CO2 25  19 - 32 (mEq/L)    Glucose, Bld 104 (*) 70 - 99 (mg/dL)    BUN 10  6 - 23 (mg/dL)    Creatinine, Ser 1.61  0.50 -  1.10 (mg/dL)    Calcium 8.7  8.4 - 10.5 (mg/dL)    GFR calc non Af Amer 48 (*) >90 (mL/min)    GFR calc Af Amer 56 (*) >90 (mL/min)   BASIC METABOLIC PANEL     Status: Abnormal   Collection Time   09/04/11 11:16 AM      Component Value Range Comment   Sodium 126 (*) 135 - 145 (mEq/L)    Potassium 3.8  3.5 - 5.1 (mEq/L)    Chloride 94 (*) 96 - 112 (mEq/L)    CO2 24  19 - 32 (mEq/L)    Glucose, Bld 159 (*) 70 - 99 (mg/dL)    BUN 11  6 - 23 (mg/dL)    Creatinine, Ser 1.19  0.50 - 1.10 (mg/dL)    Calcium 9.3  8.4 - 10.5 (mg/dL)    GFR calc non Af Amer 54 (*) >90 (mL/min)    GFR calc Af Amer 62 (*) >90 (mL/min)   CBC     Status: Abnormal   Collection Time   09/04/11 11:16 AM      Component Value Range Comment   WBC 10.2  4.0 - 10.5 (K/uL)    RBC 3.68 (*) 3.87 - 5.11 (MIL/uL)    Hemoglobin 11.7 (*) 12.0 - 15.0 (g/dL)    HCT 14.7 (*) 82.9 - 46.0 (%)    MCV 90.2  78.0 - 100.0 (fL)    MCH 31.8  26.0 - 34.0 (pg)    MCHC 35.2  30.0 - 36.0 (g/dL)    RDW 56.2  13.0 - 86.5 (%)    Platelets 416 (*) 150 - 400 (K/uL)   CBC     Status:  Abnormal   Collection Time   09/05/11  7:00 AM      Component Value Range Comment   WBC 6.1  4.0 - 10.5 (K/uL)    RBC 3.37 (*) 3.87 - 5.11 (MIL/uL)    Hemoglobin 10.5 (*) 12.0 - 15.0 (g/dL)    HCT 78.4 (*) 69.6 - 46.0 (%)    MCV 89.6  78.0 - 100.0 (fL)    MCH 31.2  26.0 - 34.0 (pg)    MCHC 34.8  30.0 - 36.0 (g/dL)    RDW 29.5  28.4 - 13.2 (%)    Platelets 371  150 - 400 (K/uL)   BASIC METABOLIC PANEL     Status: Abnormal   Collection Time   09/05/11  7:00 AM      Component Value Range Comment   Sodium 131 (*) 135 - 145 (mEq/L)    Potassium 3.9  3.5 - 5.1 (mEq/L)    Chloride 97  96 - 112 (mEq/L)    CO2 24  19 - 32 (mEq/L)    Glucose, Bld 109 (*) 70 - 99 (mg/dL)    BUN 11  6 - 23 (mg/dL)    Creatinine, Ser 4.40  0.50 - 1.10 (mg/dL)    Calcium 9.3  8.4 - 10.5 (mg/dL)    GFR calc non Af Amer 49 (*) >90 (mL/min)    GFR calc Af Amer 57 (*) >90 (mL/min)     Dg Lumbar Spine 2-3 Views  08/29/2011  *RADIOLOGY REPORT*  Clinical Data: Severe low back pain.  LUMBAR SPINE - 2-3 VIEW 08/29/2011:  Comparison: Lumbar spine MRI 07/26/2010 [facility] [address]  Lumbar spine x-rays 02/05/2010 [facility].  Findings: Lateral image was obtained using cross-table lateral technique.  Five non-rib bearing lumbar vertebrae with anatomic  alignment.  No fractures.  Straightening of the usual lumbar lordosis.  Mild disc space narrowing and endplate hypertrophic changes at L2-3, L3-4, L4-5, and L5-S1, unchanged.  Thoracolumbar scoliosis convex left, unchanged.  Posterior element hypertrophy throughout the lumbar spine.  Visualized sacroiliac joints intact with degenerative changes.  Degenerative changes also noted in both hips.  IMPRESSION: No acute osseous abnormality.  Straightening of the usual lumbar lordosis which may reflect positioning and/or spasm.  Diffuse degenerative disc disease, spondylosis, and facet degenerative changes.  Scoliosis.  Degenerative changes involving the visualized  sacroiliac joints and hips.  Original Report Authenticated By: Arnell Sieving, M.D.   Ct Lumbar Spine Wo Contrast  08/29/2011  *RADIOLOGY REPORT*  Clinical Data: 77 year old female with back pain, bilateral leg pain.  Remote history of colon cancer.  CT LUMBAR SPINE WITHOUT CONTRAST  Technique:  Multidetector CT imaging of the lumbar spine was performed without intravenous contrast administration. Multiplanar CT image reconstructions were also generated.  Comparison: Lumbar radiographs 08/29/2011.  MRI 07/26/2010. CT abdomen and pelvis 07/02/2006.  Findings: Negative visualized abdominal viscera.  Osteopenia.  Normal lumbar segmentation.  Chronic changes to the medial right iliac bone partially visible, likely Paget's disease and stable since 2007. Visualized sacrum appears intact.  Negative SI joints.  Stable lumbar vertebral height and alignment.  Mild scoliosis. No acute osseous abnormality identified.  T12-L1:  Chronic circumferential disc osteophyte complex.  No significant spinal stenosis.  L1-L2:  Chronic partially calcified circumferential disc bulge.  No significant stenosis.  L2-L3:  Chronic advanced disc degeneration with bulky right far lateral disc osteophyte complex.  Moderate facet degeneration. Mild spinal, right lateral recess, right foraminal stenosis.  L3-L4:  Chronic circumferential disc bulge.  Severe facet and ligament flavum hypertrophy.  Severe spinal stenosis and moderate right foraminal stenosis may have increased.  L4-L5:  Chronic circumferential disc osteophyte complex.  Severe ligament flavum and facet hypertrophy. Chronic severe spinal stenosis and mild left foraminal stenosis.  L5-S1:  Chronic very severe facet hypertrophy on the left.  Chronic bulky left far lateral disc osteophyte complex.  Chronic severe left lateral recess stenosis and left foraminal stenosis.  IMPRESSION: 1.  Chronic severe lumbar degenerative changes including severe spinal stenosis at L3-L4 and L4-L5,  with stenosis at the former appearing somewhat progressed since 2011. 2.  Chronic Paget's disease of the right hemi pelvis appears stable since 2007. 3. No acute osseous abnormality identified.  Original Report Authenticated By: Harley Hallmark, M.D.   Dg Chest Portable 1 View  09/02/2011  *RADIOLOGY REPORT*  Clinical Data: Short of breath.  Possible congestive heart failure.  PORTABLE CHEST - 1 VIEW  Comparison: 04/19/2010  Findings: Artifact overlies chest.  Heart size is normal.  There is a poor inspiration.  Allowing for that, the lungs are clear the vascularity is normal.  No effusions.  No focal findings.  IMPRESSION: Poor inspiration.  No active disease.  Original Report Authenticated By: Thomasenia Sales, M.D.   Recent Results (from the past 240 hour(s))  URINE CULTURE     Status: Normal   Collection Time   08/29/11 11:57 AM      Component Value Range Status Comment   Specimen Description URINE, CATHETERIZED   Final    Special Requests NONE   Final    Setup Time [id number]   Final    Colony Count >=100,000 COLONIES/ML   Final    Culture ESCHERICHIA COLI   Final    Report Status 08/31/2011 FINAL   Final  Organism ID, Bacteria ESCHERICHIA COLI   Final     Disposition: DC home with home health.  Diet: Heart healthy diet.  Activity: Resume as tolerated.   Follow-up Appts: Discharge Orders    Future Orders Please Complete By Expires   Diet - low sodium heart healthy      Increase activity slowly      Discharge instructions      Comments:   Followup with Dr. Shirlee Latch (Cardiology) in 2 weeks. Followup with Judie Petit, MD (PCP) in 1 week. Please have Dr. Cato Mulligan do a BMET at the next clinic visit.      Time spent on discharge, talking to the patient, and coordinating care: 40 mins.   Signed: Cristal Ford, MD 09/05/2011, 9:55 AM

## 2011-09-06 MED FILL — Dextrose Inj 5%: INTRAVENOUS | Qty: 50 | Status: AC

## 2011-09-13 ENCOUNTER — Encounter: Payer: Self-pay | Admitting: Internal Medicine

## 2011-09-13 ENCOUNTER — Ambulatory Visit (INDEPENDENT_AMBULATORY_CARE_PROVIDER_SITE_OTHER): Payer: Medicare Other | Admitting: Internal Medicine

## 2011-09-13 DIAGNOSIS — I214 Non-ST elevation (NSTEMI) myocardial infarction: Secondary | ICD-10-CM

## 2011-09-13 DIAGNOSIS — M549 Dorsalgia, unspecified: Secondary | ICD-10-CM

## 2011-09-13 DIAGNOSIS — M48061 Spinal stenosis, lumbar region without neurogenic claudication: Secondary | ICD-10-CM

## 2011-09-13 DIAGNOSIS — E871 Hypo-osmolality and hyponatremia: Secondary | ICD-10-CM

## 2011-09-13 DIAGNOSIS — I10 Essential (primary) hypertension: Secondary | ICD-10-CM

## 2011-09-13 LAB — BASIC METABOLIC PANEL
BUN: 16 mg/dL (ref 6–23)
CO2: 25 mEq/L (ref 19–32)
Calcium: 9.5 mg/dL (ref 8.4–10.5)
Chloride: 102 mEq/L (ref 96–112)
Creatinine, Ser: 1.2 mg/dL (ref 0.4–1.2)
Glucose, Bld: 109 mg/dL — ABNORMAL HIGH (ref 70–99)

## 2011-09-13 MED ORDER — HYDROCODONE-ACETAMINOPHEN 5-500 MG PO TABS: 2.0000 | ORAL_TABLET | Freq: Four times a day (QID) | ORAL | Status: DC | PRN

## 2011-09-13 MED ORDER — TRAMADOL HCL 50 MG PO TABS: 50.0000 mg | ORAL_TABLET | Freq: Four times a day (QID) | ORAL | Status: DC | PRN

## 2011-09-13 NOTE — Patient Instructions (Signed)
Limit your sodium (Salt) intake  Take medications as directed. Try tramadol 50 mg every 6 hours initially. If this is not effective try hydrocodone every 6 hours  Cardiology followup as scheduled

## 2011-09-13 NOTE — Progress Notes (Signed)
  Subjective:    Patient ID: Becky Morales, female    DOB: 24-Jun-1920, 77 y.o.   MRN: [id number]  HPI  77 year old patient who was discharged in the hospital approximately one week ago for a non-ST elevated MI.  She has a history of severe spinal stenosis and the back pain right greater than the left. This is her chief complaint today. During the hospital. She was evaluated by neurosurgery and she did not feel she wanted to pursue operative intervention or spinal injections. She is using OTC pain medication only She denies any chest pain or shortness of breath. She is scheduled for cardiology followup next week Hospital records reviewed history of hyponatremia. She has a history of hypertension which has been well-controlled.    Review of Systems  Constitutional: Negative.   HENT: Negative for hearing loss, congestion, sore throat, rhinorrhea, dental problem, sinus pressure and tinnitus.   Eyes: Negative for pain, discharge and visual disturbance.  Respiratory: Negative for cough and shortness of breath.   Cardiovascular: Negative for chest pain, palpitations and leg swelling.  Gastrointestinal: Negative for nausea, vomiting, abdominal pain, diarrhea, constipation, blood in stool and abdominal distention.  Genitourinary: Negative for dysuria, urgency, frequency, hematuria, flank pain, vaginal bleeding, vaginal discharge, difficulty urinating, vaginal pain and pelvic pain.  Musculoskeletal: Positive for back pain and gait problem. Negative for joint swelling and arthralgias.  Skin: Negative for rash.  Neurological: Negative for dizziness, syncope, speech difficulty, weakness, numbness and headaches.  Hematological: Negative for adenopathy.  Psychiatric/Behavioral: Negative for behavioral problems, dysphoric mood and agitation. The patient is not nervous/anxious.        Objective:   Physical Exam  Constitutional: She is oriented to person, place, and time. She appears well-developed and  well-nourished.  HENT:  Head: Normocephalic.  Right Ear: External ear normal.  Left Ear: External ear normal.  Mouth/Throat: Oropharynx is clear and moist.  Eyes: Conjunctivae and EOM are normal. Pupils are equal, round, and reactive to light.  Neck: Normal range of motion. Neck supple. No thyromegaly present.  Cardiovascular: Normal rate, regular rhythm, normal heart sounds and intact distal pulses.   Pulmonary/Chest: Effort normal and breath sounds normal.  Abdominal: Soft. Bowel sounds are normal. She exhibits no mass. There is no tenderness.  Musculoskeletal: Normal range of motion.  Lymphadenopathy:    She has no cervical adenopathy.  Neurological: She is alert and oriented to person, place, and time.  Skin: Skin is warm and dry. No rash noted.  Psychiatric: She has a normal mood and affect. Her behavior is normal.          Assessment & Plan:   Spinal stenosis with back pain Coronary artery disease status post recent non-ST segment elevated MI.  Asymptomatic Hypertension well controlled History of hyponatremia. We'll check a BMet

## 2011-09-19 ENCOUNTER — Telehealth: Payer: Self-pay | Admitting: Cardiology

## 2011-09-19 ENCOUNTER — Encounter: Payer: Self-pay | Admitting: Physician Assistant

## 2011-09-19 ENCOUNTER — Ambulatory Visit (INDEPENDENT_AMBULATORY_CARE_PROVIDER_SITE_OTHER): Payer: Medicare Other | Admitting: Physician Assistant

## 2011-09-19 DIAGNOSIS — E785 Hyperlipidemia, unspecified: Secondary | ICD-10-CM

## 2011-09-19 DIAGNOSIS — I10 Essential (primary) hypertension: Secondary | ICD-10-CM

## 2011-09-19 DIAGNOSIS — K219 Gastro-esophageal reflux disease without esophagitis: Secondary | ICD-10-CM

## 2011-09-19 DIAGNOSIS — I2510 Atherosclerotic heart disease of native coronary artery without angina pectoris: Secondary | ICD-10-CM

## 2011-09-19 DIAGNOSIS — I25769 Atherosclerosis of bypass graft of coronary artery of transplanted heart with unspecified angina pectoris: Secondary | ICD-10-CM | POA: Insufficient documentation

## 2011-09-19 LAB — BASIC METABOLIC PANEL
BUN: 17 mg/dL (ref 6–23)
Calcium: 9.2 mg/dL (ref 8.4–10.5)
Creatinine, Ser: 1.1 mg/dL (ref 0.4–1.2)
GFR: 62.67 mL/min (ref >60.00)
Glucose, Bld: 107 mg/dL — ABNORMAL HIGH (ref 70–99)

## 2011-09-19 MED ORDER — PANTOPRAZOLE SODIUM 40 MG PO TBEC: 40.0000 mg | DELAYED_RELEASE_TABLET | Freq: Every day | ORAL | Status: DC

## 2011-09-19 NOTE — Telephone Encounter (Signed)
New problem Pharmacy called and said she is taking on prilosec from different dr Please call and let her know about e script for protonix

## 2011-09-19 NOTE — Assessment & Plan Note (Signed)
Doing well post PCI for NSTEMI.  Continue ASA and Plavix and we discussed the importance of remaining on these medications.  She is not interested in cardiac rehab.  Continue statin, beta blocker, ARB.  Follow up with Dr. [name] in 4-6 weeks.  Of note, she has diffuse inf-lateral TW inversions on ECG.  She is not having any angina and I reviewed her ECG with Dr. Verne Carrow.  No further workup is needed at this time.

## 2011-09-19 NOTE — Telephone Encounter (Signed)
rx question 

## 2011-09-19 NOTE — Assessment & Plan Note (Signed)
Repeat BP is optimal.  Continue current Rx.  Recent K+ high normal.  Repeat BMET today.

## 2011-09-19 NOTE — Patient Instructions (Addendum)
Your physician recommends that you schedule a follow-up appointment in: 4-6 weeks with Dr Shirlee Latch  Your physician recommends that you return for lab work in: today (BMET) and in 4-6 weeks (Lipid panel and LFTs)  Your physician has recommended you make the following change in your medication:  Stop your Prilosec and start Protonix 40mg  daily

## 2011-09-19 NOTE — Assessment & Plan Note (Signed)
Continue Lipitor and check L/L in 6 weeks.

## 2011-09-19 NOTE — Progress Notes (Signed)
[address] [place], [place]  [address] Phone: [phone] Fax:  [phone]   History of Present Illness: PCP:  Dr. Cato Mulligan Primary Cardiologist:  Dr. [name]   Becky Morales is a 77 y.o. female who presents for post hospital follow up.  She has a h/o HTN, Carcinoid and spinal stenosis.  She was admitted 11/8-11/15.  She presented with worsening back pain and weakness.  She had worsening spinal stenosis noted on CT. She was seen by neurosurgery who recommend surgery.  However the patient declined.  She developed chest pain and ruled in for a NSTEMI.  LHC 09/03/11: pLAD 40-50%, pDx 80-90% (very small), lateral branch of OM 99%, m-dCFX 40%, oRCA 80%, m-dRCA 50-70%, EF 55%.  She had PCI with BMS to lateral branch of the OM.  RCA was tx medically.  She was noted to develop hyponatremia with Na 125.  Cortisol and TSH were normal and her Na recovered.  Follow up bmet 11/23: K 5.2, creatinine 1.2.  She was set up with HHPT and will eventually need to go to cardiac rehab.    She denies further chest pain.  No dyspnea.  No syncope, orthopnea, PND or significant edema.  She notes sinus drainage with cough.  No fevers or chills.  She is finished with HHPT.  She is not interested in outpatient cardiac rehab.    Past Medical History  Diagnosis Date  . CARCINOID TUMOR 04/13/2007  . HYPERTENSION 04/13/2007  . ALLERGIC RHINITIS 04/13/2007  . GERD 04/13/2007  . HYPERGLYCEMIA 04/13/2007  . CHOLELITHIASIS 05/02/2010  . Arthritis   . Cancer   . Environmental allergies     has mucus in throat  . Diphtheria     age 83  . CAD (coronary artery disease)     NSTEMI.  LHC 09/03/11: pLAD 40-50%, pDx 80-90% (very small), lateral branch of OM 99%, m-dCFX 40%, oRCA 80%, m-dRCA 50-70%, EF 55%.  She had PCI with BMS to lateral branch of the OM.  RCA was tx medically.   . Hyponatremia     Current Outpatient Prescriptions  Medication Sig Dispense Refill  . aspirin 81 MG tablet Take 81 mg by  mouth daily.       Marland Kitchen atorvastatin (LIPITOR) 80 MG tablet Take 1 tablet (80 mg total) by mouth daily.  30 tablet  1  . calcium-vitamin D (OSCAL WITH D 500-200) 500-200 MG-UNIT per tablet Take 1 tablet by mouth daily.       . clopidogrel (PLAVIX) 75 MG tablet Take 1 tablet (75 mg total) by mouth daily with breakfast.  30 tablet  1  . ferrous sulfate 325 (65 FE) MG tablet Take 325 mg by mouth daily with breakfast.       . HYDROcodone-acetaminophen (VICODIN) 5-500 MG per tablet Take 2 tablets by mouth every 6 (six) hours as needed for pain.  20 tablet  0  . metoprolol succinate (TOPROL-XL) 25 MG 24 hr tablet Take 1 tablet (25 mg total) by mouth daily.  30 tablet  1  . multivitamin (THERAGRAN) per tablet Take 1 tablet by mouth daily.       . nitroGLYCERIN (NITROSTAT) 0.4 MG SL tablet Place 1 tablet (0.4 mg total) under the tongue every 5 (five) minutes as needed for chest pain.  30 tablet  12  . omeprazole (PRILOSEC) 20 MG capsule        . potassium chloride (K-DUR,KLOR-CON) 10 MEQ tablet        .  traMADol (ULTRAM) 50 MG tablet Take 1 tablet (50 mg total) by mouth every 6 (six) hours as needed for pain. Maximum dose= 8 tablets per day  20 tablet  0  . valsartan (DIOVAN) 80 MG tablet Take 1 tablet (80 mg total) by mouth daily.  30 tablet  1    Allergies: No Known Allergies  History  Substance Use Topics  . Smoking status: Never Smoker   . Smokeless tobacco: Never Used  . Alcohol Use: No     ROS:  Please see the history of present illness.   All other systems reviewed and negative.   Vital Signs: BP 144/72  Ht 5\' 7"  (1.702 m)  Wt 160 lb 12.8 oz (72.938 kg)  BMI 25.18 kg/m2  PHYSICAL EXAM: Well nourished, well developed, in no acute distress HEENT: normal Neck: no JVD at 90 degrees Cardiac:  normal S1, S2; RRR; 2/6 systolic murmur at RUSB Lungs:  clear to auscultation bilaterally, no wheezing, rhonchi or rales Abd: soft, nontender, no hepatomegaly Ext: no edema; RFA site without  hematoma or bruit Skin: warm and dry Neuro:  CNs 2-12 intact, no focal abnormalities noted  EKG:   NSR, HR 73, normal axis, TW inversions 2, 3, aVF, V4-6 (new since last tracing prior to discharge)  Echocardiogram 09/03/11: Study Conclusions  - Left ventricle: The cavity size was normal. Wall thickness was normal. Systolic function was normal. The estimated   ejection fraction was in the range of 55% to 60%. - Aortic valve: Moderately calcified annulus. Mild regurgitation. - Mitral valve: Mild regurgitation. - Atrial septum: No defect or patent foramen ovale was identified.  ASSESSMENT AND PLAN:

## 2011-09-19 NOTE — Assessment & Plan Note (Signed)
D/c Prilosec due to potential interaction with Plavix.  Start Protonix 40 mg QD.

## 2011-09-20 ENCOUNTER — Other Ambulatory Visit: Payer: Self-pay

## 2011-09-20 MED ORDER — PANTOPRAZOLE SODIUM 40 MG PO TBEC: 40.0000 mg | DELAYED_RELEASE_TABLET | Freq: Every day | ORAL | Status: DC

## 2011-09-20 NOTE — Telephone Encounter (Signed)
.   Requested Prescriptions   Pending Prescriptions Disp Refills  . pantoprazole (PROTONIX) 40 MG tablet 30 tablet 6    Sig: Take 1 tablet (40 mg total) by mouth daily.

## 2011-09-20 NOTE — Telephone Encounter (Signed)
She is to stop Prilosec due to interaction with Plavix and recent PCI for NSTEMI. She can start Protonix in lieu of Prilosec. Make sure pharmacy knows that Prilosec is stopped.  Tereso Newcomer, PA-C  2:18 PM 09/20/2011

## 2011-09-20 NOTE — Telephone Encounter (Signed)
I talked with Lu Duffel at [facility]. He is aware pt to change from Prilosec to Protonix due to Prilosec/ Plavix interaction.

## 2011-09-20 NOTE — Telephone Encounter (Signed)
I will forward this to [facility].

## 2011-09-23 ENCOUNTER — Telehealth: Payer: Self-pay | Admitting: Cardiology

## 2011-09-23 NOTE — Telephone Encounter (Signed)
NA

## 2011-09-23 NOTE — Telephone Encounter (Signed)
Pt's plavix too expensive, can change to something cheaper? Uses [facility], pls call

## 2011-09-24 NOTE — Telephone Encounter (Signed)
I talked with pt. I confirmed that pt had clopidogrel 75mg  daily and  had been taking this regularly. Her insurance will pay for this.

## 2011-10-08 ENCOUNTER — Telehealth: Payer: Self-pay | Admitting: Internal Medicine

## 2011-10-08 NOTE — Telephone Encounter (Signed)
10-10-2011 8:30am post hos fup

## 2011-10-08 NOTE — Telephone Encounter (Signed)
Ok to f/u with padonda

## 2011-10-08 NOTE — Telephone Encounter (Signed)
Pt needs a hospital follow up before 10/30/11 please advise

## 2011-10-09 ENCOUNTER — Other Ambulatory Visit: Payer: Self-pay | Admitting: *Deleted

## 2011-10-09 MED ORDER — METOPROLOL SUCCINATE ER 25 MG PO TB24: 25.0000 mg | ORAL_TABLET | Freq: Every day | ORAL | Status: DC

## 2011-10-09 MED ORDER — VALSARTAN 80 MG PO TABS: 80.0000 mg | ORAL_TABLET | Freq: Every day | ORAL | Status: DC

## 2011-10-10 ENCOUNTER — Ambulatory Visit (INDEPENDENT_AMBULATORY_CARE_PROVIDER_SITE_OTHER): Payer: Medicare Other | Admitting: Family Medicine

## 2011-10-10 ENCOUNTER — Encounter: Payer: Self-pay | Admitting: Family Medicine

## 2011-10-10 VITALS — BP 146/80 | Temp 98.4°F | Wt 156.0 lb

## 2011-10-10 DIAGNOSIS — I252 Old myocardial infarction: Secondary | ICD-10-CM

## 2011-10-10 DIAGNOSIS — R05 Cough: Secondary | ICD-10-CM

## 2011-10-10 MED ORDER — GUAIFENESIN-CODEINE 100-10 MG/5ML PO SYRP: 5.0000 mL | ORAL_SOLUTION | Freq: Three times a day (TID) | ORAL | Status: AC | PRN

## 2011-10-10 MED ORDER — GUAIFENESIN-CODEINE 100-10 MG/5ML PO SYRP: 5.0000 mL | ORAL_SOLUTION | Freq: Three times a day (TID) | ORAL | Status: DC | PRN

## 2011-10-10 NOTE — Progress Notes (Signed)
Subjective:    Patient ID: Becky Morales, female    DOB: [birth year], 77 y.o.   MRN: [id number]  HPI 77 year old [place] American female, nonsmoker, and requesting clearance to drive. She's been cleared by cardiology to resume driving. She is 1 month status post non-ST elevated myocardial infarction. But the doctor also want her to see her PCP to be clear.  Patient is also in with complaints of a cough going on for about a week that is productive, clear drainage. The cough tends to be worse at night. She denies any sneezing, sinus pressure, or sinus pain, no nausea, vomiting, shortness of breath or chest pain.   Review of Systems  Constitutional: Negative.   HENT: Negative.   Respiratory: Positive for cough. Negative for apnea and chest tightness.   Cardiovascular: Negative.   Gastrointestinal: Negative.   Genitourinary: Negative.   Musculoskeletal: Negative.   Psychiatric/Behavioral: Negative.    Past Medical History  Diagnosis Date  . CARCINOID TUMOR 04/13/2007  . HYPERTENSION 04/13/2007  . ALLERGIC RHINITIS 04/13/2007  . GERD 04/13/2007  . HYPERGLYCEMIA 04/13/2007  . CHOLELITHIASIS 05/02/2010  . Arthritis   . Cancer   . Environmental allergies     has mucus in throat  . Diphtheria     age 62  . CAD (coronary artery disease)     NSTEMI.  LHC 09/03/11: pLAD 40-50%, pDx 80-90% (very small), lateral branch of OM 99%, m-dCFX 40%, oRCA 80%, m-dRCA 50-70%, EF 55%.  She had PCI with BMS to lateral branch of the OM.  RCA was tx medically.   . Hyponatremia   . Murmur     echo 11/12: EF 55-60%, mod calcified AV annulus, mild AI, mild MR    History   Social History  . Marital Status: Widowed    Spouse Name: N/A    Number of Children: N/A  . Years of Education: N/A   Occupational History  . Not on file.   Social History Main Topics  . Smoking status: Never Smoker   . Smokeless tobacco: Never Used  . Alcohol Use: No  . Drug Use: No  . Sexually Active: No   Other Topics Concern    . Not on file   Social History Narrative   Lives by herself, but has many family members that live in the area.    Past Surgical History  Procedure Date  . Sbo surgery   . Tonsillectomy     many years ago    Family History  Problem Relation Age of Onset  . Hypertension Mother   . Uterine cancer Mother   . Ovarian cancer Mother   . Hypertension Father   . Stroke Father   . Hypertension Brother   . Hyperlipidemia Brother   . Heart attack Brother   . Lung cancer Sister     No Known Allergies  Current Outpatient Prescriptions on File Prior to Visit  Medication Sig Dispense Refill  . aspirin 81 MG tablet Take 81 mg by mouth daily.       Marland Kitchen atorvastatin (LIPITOR) 80 MG tablet Take 1 tablet (80 mg total) by mouth daily.  30 tablet  1  . calcium-vitamin D (OSCAL WITH D 500-200) 500-200 MG-UNIT per tablet Take 1 tablet by mouth daily.       . cetirizine (ZYRTEC) 10 MG tablet Take 10 mg by mouth daily.        . clopidogrel (PLAVIX) 75 MG tablet Take 1 tablet (75 mg total) by mouth daily  with breakfast.  30 tablet  1  . ferrous sulfate 325 (65 FE) MG tablet Take 325 mg by mouth daily with breakfast.       . metoprolol succinate (TOPROL-XL) 25 MG 24 hr tablet Take 1 tablet (25 mg total) by mouth daily.  30 tablet  5  . multivitamin (THERAGRAN) per tablet Take 1 tablet by mouth daily.       . nitroGLYCERIN (NITROSTAT) 0.4 MG SL tablet Place 1 tablet (0.4 mg total) under the tongue every 5 (five) minutes as needed for chest pain.  30 tablet  12  . pantoprazole (PROTONIX) 40 MG tablet Take 1 tablet (40 mg total) by mouth daily.  30 tablet  6  . potassium chloride (K-DUR,KLOR-CON) 10 MEQ tablet        . traMADol (ULTRAM) 50 MG tablet Take 1 tablet (50 mg total) by mouth every 6 (six) hours as needed for pain. Maximum dose= 8 tablets per day  20 tablet  0  . valsartan (DIOVAN) 80 MG tablet Take 1 tablet (80 mg total) by mouth daily.  30 tablet  5    BP 146/80  Temp(Src) 98.4 F (36.9  C) (Oral)  Wt 156 lb (70.761 kg)chart    Objective:   Physical Exam  Constitutional: She is oriented to person, place, and time. She appears well-developed and well-nourished.  HENT:  Head: Normocephalic.  Right Ear: External ear normal.  Left Ear: External ear normal.  Nose: Nose normal.  Mouth/Throat: Oropharynx is clear and moist.  Neck: Normal range of motion. Neck supple.  Cardiovascular: Normal rate, regular rhythm and normal heart sounds.   Pulmonary/Chest: Effort normal.  Abdominal: Soft. Bowel sounds are normal.  Musculoskeletal: Normal range of motion.  Neurological: She is alert and oriented to person, place, and time.  Skin: Skin is warm and dry.  Psychiatric: She has a normal mood and affect.          Assessment & Plan:  Assessment: Status post NSTEMI, Cough  Plan: Robitussin AC prn cough at night. Since cardiology has cleared patient from a cardiovascular standpoint and drive, have advised that she may resume driving. Patient to follow up with Dr. Timoteo Gaul is scheduled and when necessary.

## 2011-10-10 NOTE — Patient Instructions (Signed)
Robitussin AC prn cough at night. Since cardiology has cleared patient from a cardiovascular standpoint and drive, have advised that she may resume driving. Patient to follow up with Dr. Timoteo Gaul is scheduled and when necessary.

## 2011-10-11 ENCOUNTER — Telehealth: Payer: Self-pay | Admitting: Family Medicine

## 2011-10-11 NOTE — Telephone Encounter (Signed)
Per Dr Cato Mulligan pt can take Delsym bid x5 days

## 2011-10-11 NOTE — Telephone Encounter (Signed)
Per Nurse Orvan Falconer, she is uncomfortable with providing another Rx for a controlled med because the say's she misplaced it. Consider asking pt's PCP for another Rx.

## 2011-10-11 NOTE — Telephone Encounter (Signed)
Was seen by [facility] yesterday and she stated that she misplaced her rx for her cough syrup. Please advise patient. Thanks.

## 2011-10-11 NOTE — Telephone Encounter (Signed)
Pt states she has some Delysm and that is not doing her any good.

## 2011-10-30 ENCOUNTER — Encounter: Payer: Self-pay | Admitting: Cardiology

## 2011-10-30 ENCOUNTER — Ambulatory Visit (INDEPENDENT_AMBULATORY_CARE_PROVIDER_SITE_OTHER): Payer: Medicare Other | Admitting: Cardiology

## 2011-10-30 ENCOUNTER — Other Ambulatory Visit: Payer: Self-pay | Admitting: *Deleted

## 2011-10-30 ENCOUNTER — Other Ambulatory Visit (INDEPENDENT_AMBULATORY_CARE_PROVIDER_SITE_OTHER): Payer: Medicare Other | Admitting: *Deleted

## 2011-10-30 VITALS — BP 134/62 | HR 72 | Ht 66.0 in | Wt 153.0 lb

## 2011-10-30 DIAGNOSIS — I2510 Atherosclerotic heart disease of native coronary artery without angina pectoris: Secondary | ICD-10-CM

## 2011-10-30 DIAGNOSIS — E785 Hyperlipidemia, unspecified: Secondary | ICD-10-CM

## 2011-10-30 DIAGNOSIS — K3189 Other diseases of stomach and duodenum: Secondary | ICD-10-CM

## 2011-10-30 DIAGNOSIS — K30 Functional dyspepsia: Secondary | ICD-10-CM

## 2011-10-30 DIAGNOSIS — I10 Essential (primary) hypertension: Secondary | ICD-10-CM

## 2011-10-30 DIAGNOSIS — E7800 Pure hypercholesterolemia, unspecified: Secondary | ICD-10-CM

## 2011-10-30 DIAGNOSIS — D649 Anemia, unspecified: Secondary | ICD-10-CM

## 2011-10-30 LAB — LIPID PANEL
Cholesterol: 115 mg/dL (ref 0–200)
HDL: 66.7 mg/dL (ref >39.00)
LDL Cholesterol: 41 mg/dL (ref 0–99)
Triglycerides: 35 mg/dL (ref 0.0–149.0)
VLDL: 7 mg/dL (ref 0.0–40.0)

## 2011-10-30 LAB — HEPATIC FUNCTION PANEL
AST: 33 U/L (ref 0–37)
Total Bilirubin: 0.8 mg/dL (ref 0.3–1.2)

## 2011-10-30 MED ORDER — METOPROLOL TARTRATE 25 MG PO TABS: 25.0000 mg | ORAL_TABLET | Freq: Two times a day (BID) | ORAL | Status: DC

## 2011-10-30 MED ORDER — FERROUS SULFATE 325 (65 FE) MG PO TABS: 325.0000 mg | ORAL_TABLET | Freq: Every day | ORAL | Status: DC

## 2011-10-30 MED ORDER — ATORVASTATIN CALCIUM 80 MG PO TABS: 80.0000 mg | ORAL_TABLET | Freq: Every day | ORAL | Status: DC

## 2011-10-30 MED ORDER — PANTOPRAZOLE SODIUM 40 MG PO TBEC: 40.0000 mg | DELAYED_RELEASE_TABLET | Freq: Every day | ORAL | Status: DC

## 2011-10-30 MED ORDER — VALSARTAN 80 MG PO TABS: 80.0000 mg | ORAL_TABLET | Freq: Every day | ORAL | Status: DC

## 2011-10-30 MED ORDER — POTASSIUM CHLORIDE CRYS ER 10 MEQ PO TBCR: 10.0000 meq | EXTENDED_RELEASE_TABLET | Freq: Every day | ORAL | Status: DC

## 2011-10-30 MED ORDER — CLOPIDOGREL BISULFATE 75 MG PO TABS: 75.0000 mg | ORAL_TABLET | Freq: Every day | ORAL | Status: AC

## 2011-10-30 NOTE — Patient Instructions (Addendum)
Your physician recommends that you schedule a follow-up appointment in: 4 months with Dr. Shirlee Latch. The office will mail a reminder letter 2 months prior appointment date. Your physician has recommended you make the following change in your medication: stop taken Toprol XL Start Metoprolol 25 mg twice a day. Your physician recommends that you return for lab work in: Lipid panel LFT today.

## 2011-10-31 DIAGNOSIS — E785 Hyperlipidemia, unspecified: Secondary | ICD-10-CM | POA: Insufficient documentation

## 2011-10-31 NOTE — Assessment & Plan Note (Signed)
Check lipids/LFTs with goal LDL < 70.  

## 2011-10-31 NOTE — Progress Notes (Signed)
PCP: Dr. Cato Mulligan  78 yo with history of CAD s/p NSTEMI in 11/12 with BMS to OM1 presents for cardiology followup.  She has been doing fairly well.  Her back pain from L-spine stenosis actually seems a bit better than it has been.  She continues to live alone but family checks on her.   She has had a prolonged cough and congestion suggestive of URI.  No fever.  She has been walking around the house without exertional dyspnea.  She is not very active, mainly due to her back pain.  No chest pain.  She wants to know if she can be on a less expensive medication than Toprol XL.   Labs (11/12): K 4, creatinine 1.1  PMH: 1. L-spine stenosis: Patient does not want surgery.  2. CAD: NSTEMI 11/12.  She had BMS to OM1.  There was also 80% ostial RCA that was not treated.  EF 55% on LV-gram.  3. HTN 4. Carcinoid 2008 5. GERD 6. Echo (11/12): EF 55-60%, mild AI, mild MR.   SH: Never smoked, lives by herself in [place] but family checks on her.    FH: No premature CAD  ROS: All systems reviewed and negative except as per HPI.   Current Outpatient Prescriptions  Medication Sig Dispense Refill  . aspirin 81 MG tablet Take 81 mg by mouth daily.       Marland Kitchen atorvastatin (LIPITOR) 80 MG tablet Take 1 tablet (80 mg total) by mouth daily.  30 tablet  8  . calcium-vitamin D (OSCAL WITH D 500-200) 500-200 MG-UNIT per tablet Take 1 tablet by mouth daily.       . cetirizine (ZYRTEC) 10 MG tablet Take 10 mg by mouth daily.        . clopidogrel (PLAVIX) 75 MG tablet Take 1 tablet (75 mg total) by mouth daily with breakfast.  30 tablet  7  . dextromethorphan (DELSYM) 30 MG/5ML liquid Take 60 mg by mouth as needed.      . ferrous sulfate 325 (65 FE) MG tablet Take 1 tablet (325 mg total) by mouth daily with breakfast.  30 tablet  8  . multivitamin (THERAGRAN) per tablet Take 1 tablet by mouth daily.       . nitroGLYCERIN (NITROSTAT) 0.4 MG SL tablet Place 1 tablet (0.4 mg total) under the tongue every 5 (five) minutes  as needed for chest pain.  30 tablet  12  . pantoprazole (PROTONIX) 40 MG tablet Take 1 tablet (40 mg total) by mouth daily.  30 tablet  8  . potassium chloride (K-DUR,KLOR-CON) 10 MEQ tablet Take 1 tablet (10 mEq total) by mouth daily.  30 tablet  8  . traMADol (ULTRAM) 50 MG tablet Take 1 tablet (50 mg total) by mouth every 6 (six) hours as needed for pain. Maximum dose= 8 tablets per day  20 tablet  0  . valsartan (DIOVAN) 80 MG tablet Take 1 tablet (80 mg total) by mouth daily.  30 tablet  5  . metoprolol tartrate (LOPRESSOR) 25 MG tablet Take 1 tablet (25 mg total) by mouth 2 (two) times daily.  60 tablet  11   BP 134/62  Pulse 72  Ht 5\' 6"  (1.676 m)  Wt 69.4 kg (153 lb)  BMI 24.69 kg/m2 General: NAD, elderly Neck: No JVD, no thyromegaly or thyroid nodule.  Lungs: Clear to auscultation bilaterally with normal respiratory effort. CV: Nondisplaced PMI.  Heart regular S1/S2, no S3/S4,2/6 early SEM RUSB.  No peripheral edema.  No carotid bruit.    Abdomen: Soft, nontender, no hepatosplenomegaly, no distention.  Neurologic: Alert and oriented x 3.  Psych: Normal affect. Extremities: No clubbing or cyanosis.

## 2011-10-31 NOTE — Assessment & Plan Note (Signed)
Stable s/p NSTEMI.  No ischemic symptoms.  She has not wanted to do cardiac rehab.   - Continue ASA 81, statin, valsartan.  - Stop Toprol XL and start metoprolol 25 mg bid (less expensive).  - Continue Plavix for at least a year as long as no bleeding develops.

## 2012-01-08 ENCOUNTER — Ambulatory Visit (INDEPENDENT_AMBULATORY_CARE_PROVIDER_SITE_OTHER): Payer: Medicare Other | Admitting: Internal Medicine

## 2012-01-08 ENCOUNTER — Ambulatory Visit: Payer: Medicare Other | Admitting: Internal Medicine

## 2012-01-08 VITALS — BP 124/74 | Temp 98.0°F | Wt 152.0 lb

## 2012-01-08 DIAGNOSIS — D649 Anemia, unspecified: Secondary | ICD-10-CM

## 2012-01-08 DIAGNOSIS — I214 Non-ST elevation (NSTEMI) myocardial infarction: Secondary | ICD-10-CM

## 2012-01-08 DIAGNOSIS — E785 Hyperlipidemia, unspecified: Secondary | ICD-10-CM

## 2012-01-08 DIAGNOSIS — I10 Essential (primary) hypertension: Secondary | ICD-10-CM

## 2012-01-08 LAB — CBC WITH DIFFERENTIAL/PLATELET
Basophils Relative: 0.5 % (ref 0.0–3.0)
Eosinophils Relative: 2.3 % (ref 0.0–5.0)
HCT: 33.7 % — ABNORMAL LOW (ref 36.0–46.0)
Hemoglobin: 11.3 g/dL — ABNORMAL LOW (ref 12.0–15.0)
Lymphs Abs: 1.2 10*3/uL (ref 0.7–4.0)
MCHC: 33.6 g/dL (ref 30.0–36.0)
MCV: 96.4 fl (ref 78.0–100.0)
Monocytes Absolute: 0.7 10*3/uL (ref 0.1–1.0)
Neutro Abs: 2.5 10*3/uL (ref 1.4–7.7)
RBC: 3.49 Mil/uL — ABNORMAL LOW (ref 3.87–5.11)
WBC: 4.5 10*3/uL (ref 4.5–10.5)

## 2012-01-08 LAB — BASIC METABOLIC PANEL
CO2: 25 mEq/L (ref 19–32)
Chloride: 103 mEq/L (ref 96–112)
Potassium: 4.2 mEq/L (ref 3.5–5.1)

## 2012-01-08 NOTE — Assessment & Plan Note (Signed)
Has seen cardiology Will continue current meds

## 2012-01-08 NOTE — Progress Notes (Signed)
Patient ID: Becky Morales, female   DOB: 19-Nov-1919, 78 y.o.   MRN: [id number] CAD with NSTEMI late last year. She feels well without Chest Pain. She has chronic DOE  She doesn't sleep well at night but takes several day time naps  Lipids---reviewed recent labs:  Lab Results  Component Value Date   CHOL 115 10/30/2011   HDL 66.70 10/30/2011   LDLCALC 41 10/30/2011   LDLDIRECT 141.6 12/02/2008   TRIG 35.0 10/30/2011   CHOLHDL 2 10/30/2011   Anemia--needs f/u  Past Medical History  Diagnosis Date  . CARCINOID TUMOR 04/13/2007  . HYPERTENSION 04/13/2007  . ALLERGIC RHINITIS 04/13/2007  . GERD 04/13/2007  . HYPERGLYCEMIA 04/13/2007  . CHOLELITHIASIS 05/02/2010  . Arthritis   . Cancer   . Environmental allergies     has mucus in throat  . Diphtheria     age 47  . CAD (coronary artery disease)     NSTEMI.  LHC 09/03/11: pLAD 40-50%, pDx 80-90% (very small), lateral branch of OM 99%, m-dCFX 40%, oRCA 80%, m-dRCA 50-70%, EF 55%.  She had PCI with BMS to lateral branch of the OM.  RCA was tx medically.   . Hyponatremia   . Murmur     echo 11/12: EF 55-60%, mod calcified AV annulus, mild AI, mild MR    History   Social History  . Marital Status: Widowed    Spouse Name: N/A    Number of Children: N/A  . Years of Education: N/A   Occupational History  . Not on file.   Social History Main Topics  . Smoking status: Never Smoker   . Smokeless tobacco: Never Used  . Alcohol Use: No  . Drug Use: No  . Sexually Active: No   Other Topics Concern  . Not on file   Social History Narrative   Lives by herself, but has many family members that live in the area.    Past Surgical History  Procedure Date  . Sbo surgery   . Tonsillectomy     many years ago    Family History  Problem Relation Age of Onset  . Hypertension Mother   . Uterine cancer Mother   . Ovarian cancer Mother   . Hypertension Father   . Stroke Father   . Hypertension Brother   . Hyperlipidemia Brother   . Heart attack  Brother   . Lung cancer Sister     Allergies  Allergen Reactions  . Ciprofloxacin     emesis    Current Outpatient Prescriptions on File Prior to Visit  Medication Sig Dispense Refill  . aspirin 81 MG tablet Take 81 mg by mouth daily.       Marland Kitchen atorvastatin (LIPITOR) 80 MG tablet Take 1 tablet (80 mg total) by mouth daily.  30 tablet  8  . calcium-vitamin D (OSCAL WITH D 500-200) 500-200 MG-UNIT per tablet Take 1 tablet by mouth daily.       . cetirizine (ZYRTEC) 10 MG tablet Take 10 mg by mouth daily.        . clopidogrel (PLAVIX) 75 MG tablet Take 1 tablet (75 mg total) by mouth daily with breakfast.  30 tablet  7  . ferrous sulfate 325 (65 FE) MG tablet Take 1 tablet (325 mg total) by mouth daily with breakfast.  30 tablet  8  . metoprolol tartrate (LOPRESSOR) 25 MG tablet Take 1 tablet (25 mg total) by mouth 2 (two) times daily.  60 tablet  11  .  multivitamin (THERAGRAN) per tablet Take 1 tablet by mouth daily.       . nitroGLYCERIN (NITROSTAT) 0.4 MG SL tablet Place 1 tablet (0.4 mg total) under the tongue every 5 (five) minutes as needed for chest pain.  30 tablet  12  . pantoprazole (PROTONIX) 40 MG tablet Take 1 tablet (40 mg total) by mouth daily.  30 tablet  8  . valsartan (DIOVAN) 80 MG tablet Take 1 tablet (80 mg total) by mouth daily.  30 tablet  5     patient denies chest pain, shortness of breath, orthopnea. Denies lower extremity edema, abdominal pain, change in appetite, change in bowel movements. Patient denies rashes, musculoskeletal complaints. No other specific complaints in a complete review of systems.   BP 124/74  Temp(Src) 98 F (36.7 C) (Oral)  Wt 152 lb (68.947 kg) Elderly, Well-developed well-nourished female in no acute distress. HEENT exam atraumatic, normocephalic, extraocular muscles are intact. Neck is supple. No jugular venous distention no thyromegaly. Chest clear to auscultation without increased work of breathing. Cardiac exam S1 and S2 are regular.  Abdominal exam active bowel sounds, soft, nontender. Extremities no edema. Neurologic exam she is alert without any motor sensory deficits.

## 2012-01-08 NOTE — Assessment & Plan Note (Signed)
Continue atorvastatin

## 2012-01-08 NOTE — Assessment & Plan Note (Signed)
Fair control Continue current meds 

## 2012-02-28 ENCOUNTER — Ambulatory Visit (INDEPENDENT_AMBULATORY_CARE_PROVIDER_SITE_OTHER): Payer: Medicare Other | Admitting: Cardiology

## 2012-02-28 ENCOUNTER — Encounter: Payer: Self-pay | Admitting: Cardiology

## 2012-02-28 VITALS — BP 144/76 | HR 66 | Ht 67.0 in | Wt 151.8 lb

## 2012-02-28 DIAGNOSIS — I2510 Atherosclerotic heart disease of native coronary artery without angina pectoris: Secondary | ICD-10-CM

## 2012-02-28 DIAGNOSIS — E785 Hyperlipidemia, unspecified: Secondary | ICD-10-CM

## 2012-02-28 MED ORDER — NITROGLYCERIN 0.4 MG SL SUBL: 0.4000 mg | SUBLINGUAL_TABLET | SUBLINGUAL | Status: DC | PRN

## 2012-02-28 NOTE — Progress Notes (Signed)
Patient ID: Becky Morales, female   DOB: [birth year], 78 y.o.   MRN: [id number] PCP: Dr. Cato Mulligan  78 yo with history of CAD s/p NSTEMI in 11/12 with BMS to OM1 presents for cardiology followup.  She has been doing fairly well.  Back pain is manageable.  She continues to live alone but family checks on her (here with her niece today).  She has been walking around the house without exertional dyspnea.  She is not very active, mainly due to her back pain.  No chest pain.  She still drives and does her shopping.   Labs (11/12): K 4, creatinine 1.1 Labs (1/13): LDL 41, HDL 67 Labs (3/13): K 4.2, creatinine 1.2  ECG: NSR, normal  PMH: 1. L-spine stenosis: Patient does not want surgery.  2. CAD: NSTEMI 11/12.  She had BMS to OM1.  There was also 80% ostial RCA that was not treated.  EF 55% on LV-gram.  3. HTN 4. Carcinoid 2008 5. GERD 6. Echo (11/12): EF 55-60%, mild AI, mild MR.   SH: Never smoked, lives by herself in [place] but family checks on her.    FH: No premature CAD   Current Outpatient Prescriptions  Medication Sig Dispense Refill  . aspirin 81 MG tablet Take 81 mg by mouth daily.       Marland Kitchen atorvastatin (LIPITOR) 80 MG tablet Take 1 tablet (80 mg total) by mouth daily.  30 tablet  8  . calcium-vitamin D (OSCAL WITH D 500-200) 500-200 MG-UNIT per tablet Take 1 tablet by mouth daily.       . cetirizine (ZYRTEC) 10 MG tablet Take 10 mg by mouth daily.        . clopidogrel (PLAVIX) 75 MG tablet Take 1 tablet (75 mg total) by mouth daily with breakfast.  30 tablet  7  . ferrous sulfate 325 (65 FE) MG tablet Take 1 tablet (325 mg total) by mouth daily with breakfast.  30 tablet  8  . metoprolol tartrate (LOPRESSOR) 25 MG tablet Take 1 tablet (25 mg total) by mouth 2 (two) times daily.  60 tablet  11  . multivitamin (THERAGRAN) per tablet Take 1 tablet by mouth daily.       . nitroGLYCERIN (NITROSTAT) 0.4 MG SL tablet Place 1 tablet (0.4 mg total) under the tongue every 5 (five) minutes as  needed for chest pain.  30 tablet  12  . pantoprazole (PROTONIX) 40 MG tablet Take 1 tablet (40 mg total) by mouth daily.  30 tablet  8  . valsartan (DIOVAN) 80 MG tablet Take 1 tablet (80 mg total) by mouth daily.  30 tablet  5  . DISCONTD: nitroGLYCERIN (NITROSTAT) 0.4 MG SL tablet Place 1 tablet (0.4 mg total) under the tongue every 5 (five) minutes as needed for chest pain.  30 tablet  12  . DISCONTD: potassium chloride (K-DUR,KLOR-CON) 10 MEQ tablet Take 1 tablet (10 mEq total) by mouth daily.  30 tablet  8   BP 144/76  Pulse 66  Ht 5\' 7"  (1.702 m)  Wt 151 lb 12 oz (68.833 kg)  BMI 23.77 kg/m2 General: NAD, elderly Neck: No JVD, no thyromegaly or thyroid nodule.  Lungs: Clear to auscultation bilaterally with normal respiratory effort. CV: Nondisplaced PMI.  Heart regular S1/S2, no S3/S4,2/6 early SEM RUSB.  No peripheral edema.  No carotid bruit.    Abdomen: Soft, nontender, no hepatosplenomegaly, no distention.  Neurologic: Alert and oriented x 3.  Psych: Normal affect. Extremities: No clubbing  or cyanosis.

## 2012-02-28 NOTE — Assessment & Plan Note (Signed)
LDL at goal (< 70) in 1/13.  

## 2012-02-28 NOTE — Assessment & Plan Note (Signed)
Stable s/p NSTEMI.  No ischemic symptoms.   - Continue ASA 81, statin, valsartan, metoprolol.  - Continue Plavix for at least a year (to 11/12) as long as no bleeding develops.

## 2012-02-28 NOTE — Patient Instructions (Signed)
Your physician wants you to follow-up in: 6 months with Dr Shirlee Latch. (November 2013). You will receive a reminder letter in the mail two months in advance. If you don't receive a letter, please call our office to schedule the follow-up appointment.   Your physician recommends that you return for a FASTING lipid profile /liver profile/BMET when you see Dr Shirlee Latch in 6 months.

## 2012-05-11 ENCOUNTER — Ambulatory Visit (INDEPENDENT_AMBULATORY_CARE_PROVIDER_SITE_OTHER): Payer: Medicare Other | Admitting: Physician Assistant

## 2012-05-11 ENCOUNTER — Ambulatory Visit (INDEPENDENT_AMBULATORY_CARE_PROVIDER_SITE_OTHER)
Admission: RE | Admit: 2012-05-11 | Discharge: 2012-05-11 | Disposition: A | Discharge status: Home / Self Care | Payer: Medicare Other | Source: Ambulatory Visit | Attending: Physician Assistant | Admitting: Physician Assistant

## 2012-05-11 ENCOUNTER — Encounter: Payer: Self-pay | Admitting: Physician Assistant

## 2012-05-11 VITALS — BP 147/80 | HR 78 | Ht 67.5 in | Wt 147.0 lb

## 2012-05-11 DIAGNOSIS — R5381 Other malaise: Secondary | ICD-10-CM

## 2012-05-11 DIAGNOSIS — R0602 Shortness of breath: Secondary | ICD-10-CM

## 2012-05-11 DIAGNOSIS — I214 Non-ST elevation (NSTEMI) myocardial infarction: Secondary | ICD-10-CM

## 2012-05-11 DIAGNOSIS — I2510 Atherosclerotic heart disease of native coronary artery without angina pectoris: Secondary | ICD-10-CM

## 2012-05-11 DIAGNOSIS — R05 Cough: Secondary | ICD-10-CM

## 2012-05-11 DIAGNOSIS — R5383 Other fatigue: Secondary | ICD-10-CM

## 2012-05-11 DIAGNOSIS — I10 Essential (primary) hypertension: Secondary | ICD-10-CM

## 2012-05-11 DIAGNOSIS — M549 Dorsalgia, unspecified: Secondary | ICD-10-CM

## 2012-05-11 DIAGNOSIS — R059 Cough, unspecified: Secondary | ICD-10-CM

## 2012-05-11 LAB — BASIC METABOLIC PANEL
BUN: 20 mg/dL (ref 6–23)
Calcium: 9.6 mg/dL (ref 8.4–10.5)
Creatinine, Ser: 1.2 mg/dL (ref 0.4–1.2)
GFR: 52.71 mL/min — ABNORMAL LOW (ref >60.00)
Glucose, Bld: 125 mg/dL — ABNORMAL HIGH (ref 70–99)

## 2012-05-11 LAB — CBC WITH DIFFERENTIAL/PLATELET
Basophils Absolute: 0 10*3/uL (ref 0.0–0.1)
Lymphocytes Relative: 24.9 % (ref 12.0–46.0)
Lymphs Abs: 1.2 10*3/uL (ref 0.7–4.0)
Monocytes Relative: 9.9 % (ref 3.0–12.0)
Neutrophils Relative %: 62.5 % (ref 43.0–77.0)
Platelets: 367 10*3/uL (ref 150.0–400.0)
RDW: 15.8 % — ABNORMAL HIGH (ref 11.5–14.6)
WBC: 4.9 10*3/uL (ref 4.5–10.5)

## 2012-05-11 LAB — TSH: TSH: 3.43 u[IU]/mL (ref 0.35–5.50)

## 2012-05-11 LAB — BRAIN NATRIURETIC PEPTIDE: Pro B Natriuretic peptide (BNP): 211 pg/mL — ABNORMAL HIGH (ref 0.0–100.0)

## 2012-05-11 MED ORDER — ISOSORBIDE MONONITRATE ER 30 MG PO TB24: 30.0000 mg | ORAL_TABLET | Freq: Every day | ORAL | Status: DC

## 2012-05-11 NOTE — Patient Instructions (Addendum)
Start Imdur 30 mg.  Take one tablet by mouth daily.    Please have blood work done today/  BMET, CBC, TSH, BNP  A chest x-ray takes a picture of the organs and structures inside the chest, including the heart, lungs, and blood vessels. This test can show several things, including, whether the heart is enlarges; whether fluid is building up in the lungs; and whether pacemaker / defibrillator leads are still in place.  Your physician recommends that you schedule a follow-up appointment in: 3-4 weeks with Dr. Shirlee Latch.

## 2012-05-11 NOTE — Progress Notes (Signed)
[address] [place], [place]  [address] Phone: [phone] Fax:  [phone]  Date:  05/11/2012   Name:  Becky Morales   DOB:  Dec 23, 1919   MRN:  [id number]  PCP:  Judie Petit, MD  Primary Cardiologist:  Dr. [name]  Primary Electrophysiologist:  None    History of Present Illness: Becky Morales is a 78 y.o. female who returns for evaluation of dyspnea.    She has a h/o HTN, carcinoid and spinal stenosis.  Admitted 08/2011 with worsening back pain and weakness. She had worsening spinal stenosis noted on CT. She was seen by neurosurgery who recommend surgery. However the patient declined. She developed chest pain and ruled in for a NSTEMI.  LHC 09/03/11: pLAD 40-50%, pDx 80-90% (very small), lateral branch of OM 99%, m-dCFX 40%, oRCA 80%, m-dRCA 50-70%, EF 55%.  PCI: BMS to lateral branch of the OM.  RCA was tx medically.  Echo 08/2011: Normal LV wall thickness, EF 55-60%, moderately calcified aortic valve annulus, mild AI, mild MR.  Last seen by Dr. Shirlee Latch 02/2012.  Stable at that time with plans for 6 month follow up.  She actually has several complaints.  She notes some pain in her right groin where her heart catheterization was done.  She also notes increased dyspnea over the last week.  She probably describes class IIb-III symptoms.  She sleeps on 2 pillows chronically.  There have been no changes.  She denies PND or pedal edema.  She denies chest pain.  She denies syncope.  She does note continued back pain without much change.  Wt Readings from Last 3 Encounters:  05/11/12 147 lb (66.679 kg)  02/28/12 151 lb 12 oz (68.833 kg)  01/08/12 152 lb (68.947 kg)     Potassium  Date/Time Value Range Status  01/08/2012  9:29 AM 4.2  3.5 - 5.1 mEq/L Final     Creatinine, Ser  Date/Time Value Range Status  01/08/2012  9:29 AM 1.2  0.4 - 1.2 mg/dL Final   TSH  Date/Time Value Range Status  09/03/2011  4:38 AM 2.060  0.350 - 4.500 uIU/mL Final    Hemoglobin  Date/Time Value Range Status  01/08/2012  9:29 AM 11.3* 12.0 - 15.0 g/dL Final    Past Medical History  Diagnosis Date  . CARCINOID TUMOR 04/13/2007  . HYPERTENSION 04/13/2007  . ALLERGIC RHINITIS 04/13/2007  . GERD 04/13/2007  . HYPERGLYCEMIA 04/13/2007  . CHOLELITHIASIS 05/02/2010  . Arthritis   . Cancer   . Environmental allergies     has mucus in throat  . Diphtheria     age 60  . CAD (coronary artery disease)     NSTEMI.  LHC 09/03/11: pLAD 40-50%, pDx 80-90% (very small), lateral branch of OM 99%, m-dCFX 40%, oRCA 80%, m-dRCA 50-70%, EF 55%.  She had PCI with BMS to lateral branch of the OM.  RCA was tx medically.   . Hyponatremia   . Murmur     echo 11/12: EF 55-60%, mod calcified AV annulus, mild AI, mild MR    Current Outpatient Prescriptions  Medication Sig Dispense Refill  . aspirin 81 MG tablet Take 81 mg by mouth daily.       Marland Kitchen atorvastatin (LIPITOR) 80 MG tablet Take 1 tablet (80 mg total) by mouth daily.  30 tablet  8  . calcium-vitamin D (OSCAL WITH D 500-200) 500-200 MG-UNIT per tablet Take 1 tablet by mouth daily.       Marland Kitchen  cetirizine (ZYRTEC) 10 MG tablet Take 10 mg by mouth daily.        . clopidogrel (PLAVIX) 75 MG tablet Take 1 tablet (75 mg total) by mouth daily with breakfast.  30 tablet  7  . ferrous sulfate 325 (65 FE) MG tablet Take 1 tablet (325 mg total) by mouth daily with breakfast.  30 tablet  8  . metoprolol tartrate (LOPRESSOR) 25 MG tablet Take 1 tablet (25 mg total) by mouth 2 (two) times daily.  60 tablet  11  . multivitamin (THERAGRAN) per tablet Take 1 tablet by mouth daily.       . nitroGLYCERIN (NITROSTAT) 0.4 MG SL tablet Place 1 tablet (0.4 mg total) under the tongue every 5 (five) minutes as needed for chest pain.  30 tablet  12  . pantoprazole (PROTONIX) 40 MG tablet Take 1 tablet (40 mg total) by mouth daily.  30 tablet  8  . valsartan (DIOVAN) 80 MG tablet Take 1 tablet (80 mg total) by mouth daily.  30 tablet  5  . DISCONTD:  potassium chloride (K-DUR,KLOR-CON) 10 MEQ tablet Take 1 tablet (10 mEq total) by mouth daily.  30 tablet  8    Allergies: Allergies  Allergen Reactions  . Ciprofloxacin     emesis    History  Substance Use Topics  . Smoking status: Never Smoker   . Smokeless tobacco: Never Used  . Alcohol Use: No     ROS:  Please see the history of present illness.   She has a chronic cough.  She notes fairly clear sputum production.  She feels cold.  She is not certain if she's had chills.  She denies fever.  She does notice spinning sensation which appears to be chronic.  She denies any melena, hematochezia, hematuria or hematemesis.  All other systems reviewed and negative.   PHYSICAL EXAM: VS:  BP 147/80  Pulse 78  Ht 5' 7.5" (1.715 m)  Wt 147 lb (66.679 kg)  BMI 22.68 kg/m2 Well nourished, well developed, in no acute distress HEENT: normal Neck: no JVD Cardiac:  normal S1, S2; RRR; no murmur Lungs:  clear to auscultation bilaterally, no wheezing, rhonchi or rales Abd: soft, nontender, no hepatomegaly Ext: trace bilateral LE edema Skin: warm and dry Neuro:  CNs 2-12 intact, no focal abnormalities noted  EKG:  Sinus rhythm, heart rate 78, normal axis, PACs, poor R-wave progression, no significant change when compared to prior tracing      ASSESSMENT AND PLAN:  1.  Dyspnea Etiology not clear.  She does not have a history of CHF.  She does not look volume overloaded on exam.  She does have residual CAD which has been treated medically.  She also has a cough which has been fairly chronic. Question if this is an anginal equivalent.  Given her advanced age, I would try medical therapy initially.  I will start her on isosorbide 30 mg a day.  Follow up in 3-4 weeks.  If she has a poor response to this and no other obvious cause for her symptoms, consider stress testing. Check labs today: Basic metabolic panel, CBC, TSH and BNP. Check a chest x-ray.  2.  CAD Continue aspirin and Plavix.   Workup as noted above.  3.  Cough Obtain chest x-ray is noted.  Chest x-ray clear, she should follow up with her PCP.  4.  Hypertension Blood pressure should be able to tolerate isosorbide.  5.  Back pain Chronic from spinal stenosis.  Question if some of this is an anginal equivalent.  Start isosorbide as noted.  Signed, Tereso Newcomer, PA-C  11:12 AM 05/11/2012

## 2012-05-14 ENCOUNTER — Telehealth: Payer: Self-pay | Admitting: *Deleted

## 2012-05-14 NOTE — Telephone Encounter (Signed)
Called patient regarding lab work and x-ray.  Pt did not start Lasix, however she did start the Isosorbide.  She took one dose last night and this morning she noticed that she was coughing harder, and her sputum had blood in it.  This is not a side effect of this medication and told her I would consult Tereso Newcomer and call her back.  Per Lorin Picket, the patient needs to follow up with her PCP immediately since her CXR was normal and she is reporting bloody sputum.  Called patient back, notified her to see PCP today.  Patient stated that she would call his office this morning.  Vista Mink, CMA

## 2012-05-14 NOTE — Telephone Encounter (Signed)
Agree. She should see her PCP for her cough and hemoptysis. Tereso Newcomer, PA-C  2:17 PM 05/14/2012

## 2012-06-19 ENCOUNTER — Ambulatory Visit: Payer: Medicare Other | Admitting: Cardiology

## 2012-06-20 ENCOUNTER — Other Ambulatory Visit: Payer: Self-pay | Admitting: Cardiology

## 2012-07-01 ENCOUNTER — Telehealth: Payer: Self-pay | Admitting: Internal Medicine

## 2012-07-01 NOTE — Telephone Encounter (Signed)
Caller: [name]/Patient; Patient Name: Becky Morales; PCP: Birdie [facility]); Best Callback Phone Number: [phone]; Reason for call: Pain in both feet that affects walking.  Onset 06/28/12.  Pain rated at 10/10 and worse with lying down and some pale discoloration to feet.  Other emergent symptoms ruled out.  Advised ED per Foot Non-Injury protocol.  Caller states she does not have transportation at this time, but will try to get there ASAP.  Information noted and sent to office per practice instructions.

## 2012-07-01 NOTE — Telephone Encounter (Signed)
ED notification 

## 2012-07-02 ENCOUNTER — Encounter: Payer: Self-pay | Admitting: Internal Medicine

## 2012-07-02 ENCOUNTER — Ambulatory Visit (INDEPENDENT_AMBULATORY_CARE_PROVIDER_SITE_OTHER): Payer: Medicare Other | Admitting: Internal Medicine

## 2012-07-02 VITALS — BP 130/78 | Temp 98.5°F | Wt 146.0 lb

## 2012-07-02 DIAGNOSIS — Z23 Encounter for immunization: Secondary | ICD-10-CM

## 2012-07-02 DIAGNOSIS — M79676 Pain in unspecified toe(s): Secondary | ICD-10-CM

## 2012-07-02 DIAGNOSIS — M79609 Pain in unspecified limb: Secondary | ICD-10-CM

## 2012-07-02 LAB — BASIC METABOLIC PANEL
Calcium: 9.6 mg/dL (ref 8.4–10.5)
Creatinine, Ser: 1.1 mg/dL (ref 0.4–1.2)
GFR: 58.11 mL/min — ABNORMAL LOW (ref >60.00)
Glucose, Bld: 98 mg/dL (ref 70–99)
Sodium: 138 mEq/L (ref 135–145)

## 2012-07-02 LAB — CBC WITH DIFFERENTIAL/PLATELET
Basophils Absolute: 0 10*3/uL (ref 0.0–0.1)
Eosinophils Absolute: 0.1 10*3/uL (ref 0.0–0.7)
Hemoglobin: 12 g/dL (ref 12.0–15.0)
Lymphocytes Relative: 18.4 % (ref 12.0–46.0)
Monocytes Relative: 10 % (ref 3.0–12.0)
Neutro Abs: 3.5 10*3/uL (ref 1.4–7.7)
Neutrophils Relative %: 69.3 % (ref 43.0–77.0)
Platelets: 365 10*3/uL (ref 150.0–400.0)
RDW: 16 % — ABNORMAL HIGH (ref 11.5–14.6)

## 2012-07-02 LAB — URIC ACID: Uric Acid, Serum: 5 mg/dL (ref 2.4–7.0)

## 2012-07-02 LAB — SEDIMENTATION RATE: Sed Rate: 36 mm/hr — ABNORMAL HIGH (ref 0–22)

## 2012-07-04 NOTE — Progress Notes (Signed)
Patient ID: Becky Morales, female   DOB: Dec 12, 1919, 78 y.o.   MRN: [id number]  Generally well but has had pain of right great toe.  No swelling No erythema No hx of gout  Past Medical History  Diagnosis Date  . CARCINOID TUMOR 04/13/2007  . HYPERTENSION 04/13/2007  . ALLERGIC RHINITIS 04/13/2007  . GERD 04/13/2007  . HYPERGLYCEMIA 04/13/2007  . CHOLELITHIASIS 05/02/2010  . Arthritis   . Cancer   . Environmental allergies     has mucus in throat  . Diphtheria     age 77  . CAD (coronary artery disease)     NSTEMI.  LHC 09/03/11: pLAD 40-50%, pDx 80-90% (very small), lateral branch of OM 99%, m-dCFX 40%, oRCA 80%, m-dRCA 50-70%, EF 55%.  She had PCI with BMS to lateral branch of the OM.  RCA was tx medically.   . Hyponatremia   . Murmur     echo 11/12: EF 55-60%, mod calcified AV annulus, mild AI, mild MR    History   Social History  . Marital Status: Widowed    Spouse Name: N/A    Number of Children: N/A  . Years of Education: N/A   Occupational History  . Not on file.   Social History Main Topics  . Smoking status: Never Smoker   . Smokeless tobacco: Never Used  . Alcohol Use: No  . Drug Use: No  . Sexually Active: No   Other Topics Concern  . Not on file   Social History Narrative   Lives by herself, but has many family members that live in the area.    Past Surgical History  Procedure Date  . Sbo surgery   . Tonsillectomy     many years ago    Family History  Problem Relation Age of Onset  . Hypertension Mother   . Uterine cancer Mother   . Ovarian cancer Mother   . Hypertension Father   . Stroke Father   . Hypertension Brother   . Hyperlipidemia Brother   . Heart attack Brother   . Lung cancer Sister     Allergies  Allergen Reactions  . Ciprofloxacin     emesis    Current Outpatient Prescriptions on File Prior to Visit  Medication Sig Dispense Refill  . aspirin 81 MG tablet Take 81 mg by mouth daily.       Marland Kitchen atorvastatin (LIPITOR) 80 MG tablet  Take 1 tablet (80 mg total) by mouth daily.  30 tablet  8  . cetirizine (ZYRTEC) 10 MG tablet Take 10 mg by mouth daily.        . clopidogrel (PLAVIX) 75 MG tablet Take 1 tablet (75 mg total) by mouth daily with breakfast.  30 tablet  7  . DIOVAN 80 MG tablet TAKE ONE (1) TABLET EACH DAY  30 tablet  2  . isosorbide mononitrate (IMDUR) 30 MG 24 hr tablet Take 1 tablet (30 mg total) by mouth daily.  90 tablet  3  . metoprolol tartrate (LOPRESSOR) 25 MG tablet Take 1 tablet (25 mg total) by mouth 2 (two) times daily.  60 tablet  11  . multivitamin (THERAGRAN) per tablet Take 1 tablet by mouth daily.       . nitroGLYCERIN (NITROSTAT) 0.4 MG SL tablet Place 1 tablet (0.4 mg total) under the tongue every 5 (five) minutes as needed for chest pain.  30 tablet  12  . pantoprazole (PROTONIX) 40 MG tablet Take 1 tablet (40 mg total)  by mouth daily.  30 tablet  8  . DISCONTD: potassium chloride (K-DUR,KLOR-CON) 10 MEQ tablet Take 1 tablet (10 mEq total) by mouth daily.  30 tablet  8     patient denies chest pain, shortness of breath, orthopnea. Denies lower extremity edema, abdominal pain, change in appetite, change in bowel movements. Patient denies rashes, musculoskeletal complaints. No other specific complaints in a complete review of systems.   BP 130/78  Temp 98.5 F (36.9 C) (Oral)  Wt 146 lb (66.225 kg) nad Extremity No LE edema No swelling of toe- no erythema  A/p- toe pain- unclear etiology Check labs-- and then will consider any other treatment

## 2012-07-07 ENCOUNTER — Telehealth: Payer: Self-pay | Admitting: *Deleted

## 2012-07-07 NOTE — Telephone Encounter (Signed)
Gave pt lab results and she reports that her back and leg pain.  She stated Dr Cato Mulligan would tell her what was next after we got lab results.  Please advise

## 2012-07-08 MED ORDER — GABAPENTIN 300 MG PO CAPS: 300.0000 mg | ORAL_CAPSULE | Freq: Every day | ORAL | Status: DC

## 2012-07-08 NOTE — Telephone Encounter (Signed)
Try neurontin 300 mg po qhs.  #30/3 refills

## 2012-07-08 NOTE — Telephone Encounter (Signed)
rx sent in electronically, pt aware 

## 2012-07-10 ENCOUNTER — Ambulatory Visit: Payer: Medicare Other | Admitting: Internal Medicine

## 2012-07-13 ENCOUNTER — Ambulatory Visit (INDEPENDENT_AMBULATORY_CARE_PROVIDER_SITE_OTHER): Payer: Medicare Other | Admitting: Internal Medicine

## 2012-07-13 ENCOUNTER — Encounter: Payer: Self-pay | Admitting: Internal Medicine

## 2012-07-13 VITALS — BP 162/94 | HR 80 | Temp 97.4°F | Wt 146.0 lb

## 2012-07-13 DIAGNOSIS — M79609 Pain in unspecified limb: Secondary | ICD-10-CM

## 2012-07-13 DIAGNOSIS — M79676 Pain in unspecified toe(s): Secondary | ICD-10-CM

## 2012-07-13 NOTE — Progress Notes (Signed)
Patient ID: Becky Morales, female   DOB: 07-07-1920, 78 y.o.   MRN: [id number] Toe pain- resolved Tried neurontin- "too hungover"  CAD-- no sxs  Reviewed meds and pmhx   Well-developed well-nourished female in no acute distress. HEENT exam atraumatic, normocephalic, extraocular muscles are intact. Neck is supple. No jugular venous distention no thyromegaly. Chest clear to auscultation without increased work of breathing. Cardiac exam S1 and S2 are regular. 2/6 SEM Abdominal exam active bowel sounds, soft, nontender. She has full range of motion of her ankles. There is no swelling of any toe joint of significance. No significant erythema of the toe joints.  Assessment and plan:  Patient had an episode of significant toe pain. This is significantly better. Neurontin, significant side effects and will be discontinued. I don't think any further evaluation is necessary at this time. I've asked her to apply ice to her feet for any significant discomfort. She can also take acetaminophen.

## 2012-07-27 ENCOUNTER — Encounter: Payer: Self-pay | Admitting: Cardiology

## 2012-07-27 ENCOUNTER — Ambulatory Visit (INDEPENDENT_AMBULATORY_CARE_PROVIDER_SITE_OTHER): Payer: Medicare Other | Admitting: Cardiology

## 2012-07-27 VITALS — BP 134/72 | HR 72 | Ht 67.5 in | Wt 149.4 lb

## 2012-07-27 DIAGNOSIS — I739 Peripheral vascular disease, unspecified: Secondary | ICD-10-CM

## 2012-07-27 DIAGNOSIS — M79609 Pain in unspecified limb: Secondary | ICD-10-CM

## 2012-07-27 DIAGNOSIS — I2510 Atherosclerotic heart disease of native coronary artery without angina pectoris: Secondary | ICD-10-CM

## 2012-07-27 DIAGNOSIS — E785 Hyperlipidemia, unspecified: Secondary | ICD-10-CM

## 2012-07-27 DIAGNOSIS — I10 Essential (primary) hypertension: Secondary | ICD-10-CM

## 2012-07-27 DIAGNOSIS — M79606 Pain in leg, unspecified: Secondary | ICD-10-CM

## 2012-07-27 LAB — LIPID PANEL
Cholesterol: 123 mg/dL (ref 0–200)
Triglycerides: 51 mg/dL (ref 0.0–149.0)

## 2012-07-27 LAB — HEPATIC FUNCTION PANEL
AST: 33 U/L (ref 0–37)
Albumin: 3.6 g/dL (ref 3.5–5.2)

## 2012-07-27 MED ORDER — METOPROLOL TARTRATE 25 MG PO TABS: 25.0000 mg | ORAL_TABLET | Freq: Two times a day (BID) | ORAL | Status: DC

## 2012-07-27 NOTE — Progress Notes (Signed)
Patient ID: Becky Morales, female   DOB: Oct 29, 1919, 78 y.o.   MRN: [id number] PCP: Dr. Cato Mulligan  78 yo with history of CAD s/p NSTEMI in 11/12 with BMS to OM1 presents for cardiology followup.  She still has considerable low back pain, radiating down her left leg.  She gets lightheaded with standing at times but was not orthostatic when we checked in the office today.  No dyspnea but she does get fatigued with walking, even around her house.  She has significant left foot pain, especially with ambulation.  No ulcers on that foot.  She had some mild chest pain at rest this morning.  This is the first chest pain she has had in a long time.  At last appointment with Tereso Newcomer, she was given Imdur for some atypical chest pain.  She does not take it because it makes her feel "jittery."    Labs (11/12): K 4, creatinine 1.1 Labs (1/13): LDL 41, HDL 67 Labs (3/13): K 4.2, creatinine 1.2 Labs (7/13): BNP 211 Labs (9/13): K 4.6, creatinine 1.1  ECG: NSR, anteroseptal Qs  PMH: 1. L-spine stenosis: Patient does not want surgery.  2. CAD: NSTEMI 11/12.  She had BMS to OM1.  There was also 80% ostial RCA that was not treated.  EF 55% on LV-gram.  3. HTN 4. Carcinoid 2008 5. GERD 6. Echo (11/12): EF 55-60%, mild AI, mild MR.   SH: Never smoked, lives by herself in [place] but family checks on her.    FH: No premature CAD  ROS: All systems reviewed and negative except as per HPI.    Current Outpatient Prescriptions  Medication Sig Dispense Refill  . aspirin 81 MG tablet Take 81 mg by mouth daily.       Marland Kitchen atorvastatin (LIPITOR) 80 MG tablet Take 1 tablet (80 mg total) by mouth daily.  30 tablet  8  . calcium-vitamin D (OSCAL WITH D) 500-200 MG-UNIT per tablet Take 1 tablet by mouth daily.      . cetirizine (ZYRTEC) 10 MG tablet Take 10 mg by mouth daily.        . clopidogrel (PLAVIX) 75 MG tablet Take 1 tablet (75 mg total) by mouth daily with breakfast.  30 tablet  7  . DIOVAN 80 MG tablet TAKE  ONE (1) TABLET EACH DAY  30 tablet  2  . magnesium oxide (MAG-OX) 400 MG tablet Take 400 mg by mouth daily.      . multivitamin (THERAGRAN) per tablet Take 1 tablet by mouth daily.       . nitroGLYCERIN (NITROSTAT) 0.4 MG SL tablet Place 1 tablet (0.4 mg total) under the tongue every 5 (five) minutes as needed for chest pain.  30 tablet  12  . pantoprazole (PROTONIX) 40 MG tablet Take 1 tablet (40 mg total) by mouth daily.  30 tablet  8  . DISCONTD: metoprolol tartrate (LOPRESSOR) 25 MG tablet Take 1 tablet (25 mg total) by mouth 2 (two) times daily.  60 tablet  11  . DISCONTD: metoprolol tartrate (LOPRESSOR) 25 MG tablet Take 25 mg by mouth daily.      . metoprolol tartrate (LOPRESSOR) 25 MG tablet Take 1 tablet (25 mg total) by mouth 2 (two) times daily.  180 tablet  3  . DISCONTD: potassium chloride (K-DUR,KLOR-CON) 10 MEQ tablet Take 1 tablet (10 mEq total) by mouth daily.  30 tablet  8   BP 134/72  Pulse 72  Ht 5' 7.5" (1.715 m)  Wt 149 lb 6.4 oz (67.767 kg)  BMI 23.05 kg/m2 General: NAD, elderly Neck: No JVD, no thyromegaly or thyroid nodule.  Lungs: Clear to auscultation bilaterally with normal respiratory effort. CV: Nondisplaced PMI.  Heart regular S1/S2, no S3/S4,1/6 early SEM RUSB.  No peripheral edema.  Left carotid bruit.  Unable to palpate pedal pulses but no foot ulcerations.  Abdomen: Soft, nontender, no hepatosplenomegaly, no distention.  Neurologic: Alert and oriented x 3.  Psych: Normal affect. Extremities: No clubbing or cyanosis.   Assessmant/Plan:  CAD  Stable s/p NSTEMI. Rare atypical chest pain now.  She does fatigue easily. - Continue ASA 81, statin, valsartan, metoprolol.  She should be taking metoprolol bid rather than qd.  - Continue Plavix for at least a year (to 11/13) as long as no bleeding develops.  Hyperlipidemia  Need to check lipids with goal LDL < 70.  Left leg and foot pain History is difficult, hard to tell if this is claudication.  She does  have significant symptoms and I cannot feel her pedal pulses.  I will get a peripheral arterial doppler evaluation.   Carotid bruid Left carotid bruit.  Check carotid dopplers.   HTN BP initially high but was lower when rechecked.  She was not orthostatic.  I am not going to change her regimen today.   [name] 07/27/2012

## 2012-07-27 NOTE — Patient Instructions (Addendum)
Take metoprolol tartrate 25mg  two times a day.   Your physician recommends that you have a FASTING lipid profile /liver profile today.   Your physician has requested that you have a lower extremity arterial duplex. This test is an ultrasound of the arteries in the legs . It looks at arterial blood flow in the legs. Allow one hour for Lower  Arterial scans. There are no restrictions or special instructions.  Your physician recommends that you schedule a follow-up appointment in: 4 months with Dr Shirlee Latch.

## 2012-08-21 ENCOUNTER — Other Ambulatory Visit: Payer: Medicare Other

## 2012-08-27 ENCOUNTER — Other Ambulatory Visit: Payer: Self-pay | Admitting: Internal Medicine

## 2012-08-27 NOTE — Telephone Encounter (Signed)
Dr. Swords pt 

## 2012-09-23 ENCOUNTER — Other Ambulatory Visit: Payer: Self-pay | Admitting: Cardiology

## 2012-09-25 ENCOUNTER — Telehealth: Payer: Self-pay | Admitting: Cardiology

## 2012-09-25 NOTE — Telephone Encounter (Signed)
Pt needs refill atorvastatin, diovan, pantroprazole [facility]

## 2012-09-28 ENCOUNTER — Other Ambulatory Visit: Payer: Self-pay | Admitting: *Deleted

## 2012-09-28 DIAGNOSIS — K30 Functional dyspepsia: Secondary | ICD-10-CM

## 2012-09-28 MED ORDER — PANTOPRAZOLE SODIUM 40 MG PO TBEC: 40.0000 mg | DELAYED_RELEASE_TABLET | Freq: Every day | ORAL | Status: DC

## 2012-11-23 DIAGNOSIS — R0989 Other specified symptoms and signs involving the circulatory and respiratory systems: Secondary | ICD-10-CM

## 2012-11-30 ENCOUNTER — Ambulatory Visit: Payer: Medicare Other | Admitting: Cardiology

## 2013-01-26 ENCOUNTER — Ambulatory Visit (INDEPENDENT_AMBULATORY_CARE_PROVIDER_SITE_OTHER): Payer: Medicare Other | Admitting: Cardiology

## 2013-01-26 ENCOUNTER — Encounter: Payer: Self-pay | Admitting: Cardiology

## 2013-01-26 ENCOUNTER — Other Ambulatory Visit: Payer: Self-pay | Admitting: Cardiology

## 2013-01-26 VITALS — BP 151/47 | HR 67 | Ht 67.5 in | Wt 143.8 lb

## 2013-01-26 DIAGNOSIS — E785 Hyperlipidemia, unspecified: Secondary | ICD-10-CM

## 2013-01-26 DIAGNOSIS — R5381 Other malaise: Secondary | ICD-10-CM

## 2013-01-26 DIAGNOSIS — I2510 Atherosclerotic heart disease of native coronary artery without angina pectoris: Secondary | ICD-10-CM

## 2013-01-26 DIAGNOSIS — R5383 Other fatigue: Secondary | ICD-10-CM | POA: Insufficient documentation

## 2013-01-26 DIAGNOSIS — R3915 Urgency of urination: Secondary | ICD-10-CM

## 2013-01-26 LAB — CBC WITH DIFFERENTIAL/PLATELET
Basophils Absolute: 0 10*3/uL (ref 0.0–0.1)
Basophils Relative: 0.3 % (ref 0.0–3.0)
Eosinophils Absolute: 0.1 10*3/uL (ref 0.0–0.7)
Hemoglobin: 13.2 g/dL (ref 12.0–15.0)
MCHC: 33.4 g/dL (ref 30.0–36.0)
MCV: 92.4 fl (ref 78.0–100.0)
Monocytes Absolute: 0.6 10*3/uL (ref 0.1–1.0)
Neutro Abs: 4.1 10*3/uL (ref 1.4–7.7)
RBC: 4.27 Mil/uL (ref 3.87–5.11)
RDW: 16.9 % — ABNORMAL HIGH (ref 11.5–14.6)
WBC: 6.1 10*3/uL (ref 4.5–10.5)

## 2013-01-26 LAB — URINALYSIS, ROUTINE W REFLEX MICROSCOPIC
Bilirubin Urine: NEGATIVE
Ketones, ur: NEGATIVE
Total Protein, Urine: NEGATIVE
Urine Glucose: NEGATIVE
pH: 5.5 (ref 5.0–8.0)

## 2013-01-26 LAB — BASIC METABOLIC PANEL
CO2: 27 mEq/L (ref 19–32)
Calcium: 9.3 mg/dL (ref 8.4–10.5)
Sodium: 134 mEq/L — ABNORMAL LOW (ref 135–145)

## 2013-01-26 NOTE — Progress Notes (Signed)
Patient ID: Becky Morales, female   DOB: January 21, 1920, 79 y.o.   MRN: [id number] PCP: Dr. Cato Mulligan  79 yo with history of CAD s/p NSTEMI in 11/12 with BMS to OM1 presents for cardiology followup.  She still lives alone.  She says that she had been doing well up until 5-6 days ago.  Over the last few days, she has been weak and lethargic.  She has felt "dizzy" though she has not passed out.  She has been more short of breath and fatigued with exertion => now notes some dyspnea walking around her house.  Poor appetite.  Occasional cough.  No fever.  She has had some urinary urgency over this period but no dysuria.  No melena or BRBPR.  Weight is down 6 lbs since last appointment.  No abdominal pain.  No flank pain.  No nausea/vomiting.  She had 1 episode of chest pain last Thursday that lasted for about 10 minutes.  No pain before that or since then.    Labs (11/12): K 4, creatinine 1.1 Labs (1/13): LDL 41, HDL 67 Labs (3/13): K 4.2, creatinine 1.2 Labs (7/13): BNP 211 Labs (9/13): K 4.6, creatinine 1.1 Labs (10/13): LDL 47, HDL 67, LFTs normal  ECG: NSR, biatrial enlargement, anteroseptal Qs  PMH: 1. L-spine stenosis: Patient does not want surgery.  2. CAD: NSTEMI 11/12.  She had BMS to OM1.  There was also 80% ostial RCA that was not treated.  EF 55% on LV-gram.  3. HTN 4. Carcinoid 2008 5. GERD 6. Echo (11/12): EF 55-60%, mild AI, mild MR.   SH: Never smoked, lives by herself in [place] but family checks on her.    FH: No premature CAD  ROS: All systems reviewed and negative except as per HPI.    Current Outpatient Prescriptions  Medication Sig Dispense Refill  . aspirin 81 MG tablet Take 81 mg by mouth daily.       Marland Kitchen atorvastatin (LIPITOR) 80 MG tablet TAKE 1 TABLET EACH DAY  90 tablet  1  . calcium-vitamin D (OSCAL WITH D) 500-200 MG-UNIT per tablet Take 1 tablet by mouth daily.      . cetirizine (ZYRTEC) 10 MG tablet Take 10 mg by mouth daily.        . clorazepate (TRANXENE) 3.75 MG  tablet Take 3.75 mg by mouth as directed.      . DimenhyDRINATE (MOTION SICKNESS PO) Take by mouth.      . DIOVAN 80 MG tablet TAKE ONE (1) TABLET EACH DAY  90 each  1  . ferrous sulfate 325 (65 FE) MG tablet Take 325 mg by mouth as directed.      . magnesium oxide (MAG-OX) 400 MG tablet Take 400 mg by mouth daily.      . metoprolol tartrate (LOPRESSOR) 25 MG tablet Take 1 tablet (25 mg total) by mouth 2 (two) times daily.  180 tablet  3  . multivitamin (THERAGRAN) per tablet Take 1 tablet by mouth daily.       . nitroGLYCERIN (NITROSTAT) 0.4 MG SL tablet Place 1 tablet (0.4 mg total) under the tongue every 5 (five) minutes as needed for chest pain.  30 tablet  12  . pantoprazole (PROTONIX) 40 MG tablet Take 1 tablet (40 mg total) by mouth daily.  90 tablet  1  . traMADol (ULTRAM) 50 MG tablet TAKE ONE TABLET BY MOUTH EVERY SIX HRS AS NEEDED  FOR PAIN.  30 tablet  3  . [DISCONTINUED] potassium  chloride (K-DUR,KLOR-CON) 10 MEQ tablet Take 1 tablet (10 mEq total) by mouth daily.  30 tablet  8   No current facility-administered medications for this visit.   BP 151/47  Pulse 67  Ht 5' 7.5" (1.715 m)  Wt 143 lb 12.8 oz (65.227 kg)  BMI 22.18 kg/m2  SpO2 99% General: NAD, elderly Neck: No JVD, no thyromegaly or thyroid nodule.  Lungs: Clear to auscultation bilaterally with normal respiratory effort. CV: Nondisplaced PMI.  Heart regular S1/S2, no S3/S4,1/6 early SEM RUSB.  No peripheral edema.  Left carotid bruit.  Unable to palpate pedal pulses but no foot ulcerations.  Abdomen: Soft, nontender, no hepatosplenomegaly, no distention.  Neurologic: Alert and oriented x 3.  Psych: Normal affect. Extremities: No clubbing or cyanosis.   Assessmant/Plan:  CAD  Stable s/p NSTEMI. Rare atypical chest pain now.  She does fatigue easily in general. - Continue ASA 81, statin, valsartan, metoprolol.   Hyperlipidemia  Good lipids in 10/13, continue statin.  Weakness/Fatigue/Urinary urgency Patient  has felt "sick" for 5-6 days now.  I am concerned that she may be infected somewhere.  Lungs are clear, abdomen is nontender.  She has had urinary urgency (no dysuria).   Need to rule out anemia as well.  - UA and culture - CBC, BMET, TSH - I will get an echocardiogram to make sure EF remains normal given increased exertional dyspnea.  - Followup in PCP's office later this week for re-assessment.  [name] 01/26/2013

## 2013-01-26 NOTE — Patient Instructions (Signed)
Your physician recommends that you have  lab work today--BMET/CBCd/TSH/Urinalysis and Urine culture.   Your physician has requested that you have an echocardiogram. Echocardiography is a painless test that uses sound waves to create images of your heart. It provides your doctor with information about the size and shape of your heart and how well your heart's chambers and valves are working. This procedure takes approximately one hour. There are no restrictions for this procedure.  Your physician recommends that you schedule a follow-up appointment the end of this week with Dr Cato Mulligan or his PA/NP.  Your physician recommends that you schedule a follow-up appointment in: 3-4 weeks with Dr Alford Highland NP/PA.

## 2013-01-27 ENCOUNTER — Other Ambulatory Visit: Payer: Self-pay | Admitting: Cardiology

## 2013-01-27 LAB — URINALYSIS, ROUTINE W REFLEX MICROSCOPIC
Bilirubin Urine: NEGATIVE
Glucose, UA: NEGATIVE mg/dL
Hgb urine dipstick: NEGATIVE
Protein, ur: NEGATIVE mg/dL
pH: 5 (ref 5.0–8.0)

## 2013-01-27 LAB — URINALYSIS, MICROSCOPIC ONLY
Casts: NONE SEEN
Crystals: NONE SEEN
WBC, UA: >50 WBC/hpf — AB (ref <3)

## 2013-01-27 LAB — TSH: TSH: 4.36 u[IU]/mL (ref 0.35–5.50)

## 2013-01-28 ENCOUNTER — Other Ambulatory Visit: Payer: Self-pay | Admitting: *Deleted

## 2013-01-28 LAB — URINE CULTURE: Colony Count: >=100000

## 2013-01-28 MED ORDER — LEVOFLOXACIN 250 MG PO TABS: ORAL_TABLET | ORAL | Status: DC

## 2013-02-01 ENCOUNTER — Ambulatory Visit (INDEPENDENT_AMBULATORY_CARE_PROVIDER_SITE_OTHER): Payer: Medicare Other | Admitting: Family

## 2013-02-01 ENCOUNTER — Encounter: Payer: Self-pay | Admitting: Family

## 2013-02-01 VITALS — BP 138/70 | HR 68 | Temp 97.7°F | Wt 146.0 lb

## 2013-02-01 DIAGNOSIS — N390 Urinary tract infection, site not specified: Secondary | ICD-10-CM

## 2013-02-01 LAB — POCT URINALYSIS DIPSTICK
Bilirubin, UA: NEGATIVE
Blood, UA: NEGATIVE
Glucose, UA: NEGATIVE
Ketones, UA: NEGATIVE
Nitrite, UA: NEGATIVE
pH, UA: 7

## 2013-02-01 MED ORDER — CLORAZEPATE DIPOTASSIUM 3.75 MG PO TABS: 3.7500 mg | ORAL_TABLET | ORAL | Status: DC

## 2013-02-01 NOTE — Progress Notes (Signed)
Subjective:    Patient ID: Becky Morales, female    DOB: December 19, 1919, 79 y.o.   MRN: [id number]  HPI 79 year old AAF, nonsmoker, patient of Dr. Cato Mulligan is in today for a recheck of a UTI originally diagnosed by Cardiology when she presented with weakness and fatigue. She was treated with Levaquin and reports feeling much better today. She had/has no urinary symptoms. No abdominal pain or back pain.    Review of Systems  Constitutional: Negative.  Negative for fatigue.  Respiratory: Negative.   Gastrointestinal: Negative.  Negative for abdominal pain.  Endocrine: Negative.   Genitourinary: Negative.  Negative for urgency and hematuria.  Musculoskeletal: Negative.  Negative for back pain.  Skin: Negative.   Psychiatric/Behavioral: Negative.    Past Medical History  Diagnosis Date  . CARCINOID TUMOR 04/13/2007  . HYPERTENSION 04/13/2007  . ALLERGIC RHINITIS 04/13/2007  . GERD 04/13/2007  . HYPERGLYCEMIA 04/13/2007  . CHOLELITHIASIS 05/02/2010  . Arthritis   . Cancer   . Environmental allergies     has mucus in throat  . Diphtheria     age 38  . CAD (coronary artery disease)     NSTEMI.  LHC 09/03/11: pLAD 40-50%, pDx 80-90% (very small), lateral branch of OM 99%, m-dCFX 40%, oRCA 80%, m-dRCA 50-70%, EF 55%.  She had PCI with BMS to lateral branch of the OM.  RCA was tx medically.   . Hyponatremia   . Murmur     echo 11/12: EF 55-60%, mod calcified AV annulus, mild AI, mild MR    History   Social History  . Marital Status: Widowed    Spouse Name: N/A    Number of Children: N/A  . Years of Education: N/A   Occupational History  . Not on file.   Social History Main Topics  . Smoking status: Never Smoker   . Smokeless tobacco: Never Used  . Alcohol Use: No  . Drug Use: No  . Sexually Active: No   Other Topics Concern  . Not on file   Social History Narrative   Lives by herself, but has many family members that live in the area.    Past Surgical History  Procedure  Laterality Date  . Sbo surgery    . Tonsillectomy      many years ago    Family History  Problem Relation Age of Onset  . Hypertension Mother   . Uterine cancer Mother   . Ovarian cancer Mother   . Hypertension Father   . Stroke Father   . Hypertension Brother   . Hyperlipidemia Brother   . Heart attack Brother   . Lung cancer Sister     Allergies  Allergen Reactions  . Ciprofloxacin     emesis    Current Outpatient Prescriptions on File Prior to Visit  Medication Sig Dispense Refill  . aspirin 81 MG tablet Take 81 mg by mouth daily.       Marland Kitchen atorvastatin (LIPITOR) 80 MG tablet TAKE 1 TABLET EACH DAY  90 tablet  1  . calcium-vitamin D (OSCAL WITH D) 500-200 MG-UNIT per tablet Take 1 tablet by mouth daily.      . cetirizine (ZYRTEC) 10 MG tablet Take 10 mg by mouth daily.        . DimenhyDRINATE (MOTION SICKNESS PO) Take by mouth.      . DIOVAN 80 MG tablet TAKE ONE (1) TABLET EACH DAY  90 each  1  . ferrous sulfate 325 (65 FE)  MG tablet Take 325 mg by mouth as directed.      Marland Kitchen levofloxacin (LEVAQUIN) 250 MG tablet 1 daily for 3 days  3 tablet  0  . magnesium oxide (MAG-OX) 400 MG tablet Take 400 mg by mouth daily.      . metoprolol tartrate (LOPRESSOR) 25 MG tablet Take 1 tablet (25 mg total) by mouth 2 (two) times daily.  180 tablet  3  . multivitamin (THERAGRAN) per tablet Take 1 tablet by mouth daily.       . nitroGLYCERIN (NITROSTAT) 0.4 MG SL tablet Place 1 tablet (0.4 mg total) under the tongue every 5 (five) minutes as needed for chest pain.  30 tablet  12  . pantoprazole (PROTONIX) 40 MG tablet Take 1 tablet (40 mg total) by mouth daily.  90 tablet  1  . traMADol (ULTRAM) 50 MG tablet TAKE ONE TABLET BY MOUTH EVERY SIX HRS AS NEEDED  FOR PAIN.  30 tablet  3  . [DISCONTINUED] potassium chloride (K-DUR,KLOR-CON) 10 MEQ tablet Take 1 tablet (10 mEq total) by mouth daily.  30 tablet  8   No current facility-administered medications on file prior to visit.    BP 138/70   Pulse 68  Temp(Src) 97.7 F (36.5 C) (Oral)  Wt 146 lb (66.225 kg)  BMI 22.52 kg/m2  SpO2 97%chart    Objective:   Physical Exam  Constitutional: She is oriented to person, place, and time. She appears well-developed and well-nourished.  Neck: Normal range of motion. Neck supple.  Cardiovascular: Normal rate, regular rhythm and normal heart sounds.   Abdominal: Soft. Bowel sounds are normal.  Neurological: She is alert and oriented to person, place, and time.  Skin: Skin is warm and dry.  Psychiatric: She has a normal mood and affect.          Assessment & Plan:  Assessment: 1. UTI-resolved  Plan: Drink plenty of fluids. Recheck with PCP as scheduled and as needed.

## 2013-02-05 ENCOUNTER — Telehealth: Payer: Self-pay | Admitting: *Deleted

## 2013-02-05 ENCOUNTER — Ambulatory Visit (HOSPITAL_COMMUNITY): Payer: Medicare Other | Attending: Cardiology | Admitting: Radiology

## 2013-02-05 DIAGNOSIS — I2510 Atherosclerotic heart disease of native coronary artery without angina pectoris: Secondary | ICD-10-CM

## 2013-02-05 DIAGNOSIS — R5381 Other malaise: Secondary | ICD-10-CM

## 2013-02-05 DIAGNOSIS — R3915 Urgency of urination: Secondary | ICD-10-CM

## 2013-02-05 NOTE — Telephone Encounter (Signed)
Message copied by Tarri Fuller on Fri Feb 05, 2013  1:29 PM ------      Message from: [facility], [place]      Created: Fri Feb 05, 2013  1:21 PM       EF normal, mild MR and AI ------

## 2013-02-05 NOTE — Progress Notes (Signed)
Echocardiogram performed.  

## 2013-02-05 NOTE — Telephone Encounter (Signed)
pt notified about echo results with verbal understanding today, advised to keep  5/6 f/u with [name], NP, pt said ok

## 2013-02-23 ENCOUNTER — Ambulatory Visit: Payer: Medicare Other | Admitting: Nurse Practitioner

## 2013-03-03 ENCOUNTER — Ambulatory Visit: Payer: Medicare Other | Admitting: Nurse Practitioner

## 2013-04-01 ENCOUNTER — Other Ambulatory Visit: Payer: Self-pay | Admitting: Cardiology

## 2013-04-14 ENCOUNTER — Encounter: Payer: Self-pay | Admitting: Nurse Practitioner

## 2013-04-14 ENCOUNTER — Ambulatory Visit (INDEPENDENT_AMBULATORY_CARE_PROVIDER_SITE_OTHER): Payer: Medicare Other | Admitting: Nurse Practitioner

## 2013-04-14 VITALS — BP 170/80 | HR 56 | Ht 67.5 in | Wt 142.1 lb

## 2013-04-14 DIAGNOSIS — I2510 Atherosclerotic heart disease of native coronary artery without angina pectoris: Secondary | ICD-10-CM

## 2013-04-14 MED ORDER — VALSARTAN 160 MG PO TABS: 160.0000 mg | ORAL_TABLET | Freq: Every day | ORAL | Status: DC

## 2013-04-14 NOTE — Patient Instructions (Addendum)
We will increase your Diovan to 160 mg a day  Try to cut back on the salt - that will help lower your blood pressure and help your swelling to go away  I will see you in a month  Call the [facility] office at [phone] if you have any questions, problems or concerns.

## 2013-04-14 NOTE — Progress Notes (Signed)
Becky Morales Date of Birth: 28-Jan-1920 Medical Record #[id number]  History of Present Illness: Becky Morales is seen back today for a post echo visit. Seen for Becky Morales. She is 79 years of age. She has known CAD with past NSTEMI in November of 2012 with BMS to OM1 with residual RCA disease that was not treated, L-spine stenosis (not wanting surgery), HTn, carcinoid noted in 2008, GERD and mild valvular heart disease.  Last seen here in April. Seemed to be doing ok. Echo was updated for her DOE. Rare chest pain reported as well as fatigue.   Comes in today. Here with family. Doing ok. She still has some dyspnea. She says she is a "little slow". Has been eating too much salt, esp pork. Some swelling. Had been trying to do TOTAL care for her niece that has been sick. This basically wore her out. No chest pain. Not really interested in further medicines. She still drives and lives alone. Not dizzy or lightheaded.   Current Outpatient Prescriptions  Medication Sig Dispense Refill  . aspirin 81 MG tablet Take 81 mg by mouth daily.       Marland Kitchen atorvastatin (LIPITOR) 80 MG tablet take 1 tablet by mouth once daily  90 tablet  1  . calcium-vitamin D (OSCAL WITH D) 500-200 MG-UNIT per tablet Take 1 tablet by mouth daily.      . cetirizine (ZYRTEC) 10 MG tablet Take 10 mg by mouth daily.        . clorazepate (TRANXENE) 3.75 MG tablet Take 1 tablet (3.75 mg total) by mouth as directed.  30 tablet  3  . DimenhyDRINATE (MOTION SICKNESS PO) Take by mouth.      . levofloxacin (LEVAQUIN) 250 MG tablet 1 daily for 3 days  3 tablet  0  . magnesium oxide (MAG-OX) 400 MG tablet Take 400 mg by mouth daily.      . metoprolol tartrate (LOPRESSOR) 25 MG tablet Take 1 tablet (25 mg total) by mouth 2 (two) times daily.  180 tablet  3  . multivitamin (THERAGRAN) per tablet Take 1 tablet by mouth daily.       . pantoprazole (PROTONIX) 40 MG tablet take 1 tablet by mouth once daily  90 tablet  1  . traMADol (ULTRAM) 50 MG tablet  TAKE ONE TABLET BY MOUTH EVERY SIX HRS AS NEEDED  FOR PAIN.  30 tablet  3  . nitroGLYCERIN (NITROSTAT) 0.4 MG SL tablet Place 1 tablet (0.4 mg total) under the tongue every 5 (five) minutes as needed for chest pain.  30 tablet  12  . valsartan (DIOVAN) 160 MG tablet Take 1 tablet (160 mg total) by mouth daily.  30 tablet  6  . [DISCONTINUED] potassium chloride (K-DUR,KLOR-CON) 10 MEQ tablet Take 1 tablet (10 mEq total) by mouth daily.  30 tablet  8   No current facility-administered medications for this visit.    Allergies  Allergen Reactions  . Ciprofloxacin     emesis    Past Medical History  Diagnosis Date  . CARCINOID TUMOR 04/13/2007  . HYPERTENSION 04/13/2007  . ALLERGIC RHINITIS 04/13/2007  . GERD 04/13/2007  . HYPERGLYCEMIA 04/13/2007  . CHOLELITHIASIS 05/02/2010  . Arthritis   . Cancer   . Environmental allergies     has mucus in throat  . Diphtheria     age 15  . CAD (coronary artery disease)     NSTEMI.  LHC 09/03/11: pLAD 40-50%, pDx 80-90% (very small), lateral branch of  OM 99%, m-dCFX 40%, oRCA 80%, m-dRCA 50-70%, EF 55%.  She had PCI with BMS to lateral branch of the OM.  RCA was tx medically.   . Hyponatremia   . Murmur     echo 11/12: EF 55-60%, mod calcified AV annulus, mild AI, mild MR    Past Surgical History  Procedure Laterality Date  . Sbo surgery    . Tonsillectomy      many years ago    History  Smoking status  . Never Smoker   Smokeless tobacco  . Never Used    History  Alcohol Use No    Family History  Problem Relation Age of Onset  . Hypertension Mother   . Uterine cancer Mother   . Ovarian cancer Mother   . Hypertension Father   . Stroke Father   . Hypertension Brother   . Hyperlipidemia Brother   . Heart attack Brother   . Lung cancer Sister     Review of Systems: The review of systems is per the HPI.  All other systems were reviewed and are negative.  Physical Exam: BP 170/80  Pulse 56  Ht 5' 7.5" (1.715 m)  Wt 142 lb  1.9 oz (64.465 kg)  BMI 21.92 kg/m2 BP by me is 180/80.  Patient is very pleasant and in no acute distress. Skin is warm and dry. Color is normal.  HEENT is unremarkable but has missing teeth.  Normocephalic/atraumatic. PERRL. Sclera are nonicteric. Neck is supple. No masses. No JVD. Lungs are clear. Cardiac exam shows a regular rate and rhythm. She has frequent ectopics. Systolic murmur noted. Abdomen is soft. Extremities are with 1+ edema. Gait and ROM are intact. No gross neurologic deficits noted.  LABORATORY DATA: EKG shows sinus arrhythmia. Anteroseptal Q's   Lab Results  Component Value Date   WBC 6.1 01/26/2013   HGB 13.2 01/26/2013   HCT 39.5 01/26/2013   PLT 407.0* 01/26/2013   GLUCOSE 78 01/26/2013   CHOL 123 07/27/2012   TRIG 51.0 07/27/2012   HDL 65.40 07/27/2012   LDLDIRECT 141.6 12/02/2008   LDLCALC 47 07/27/2012   ALT 28 07/27/2012   AST 33 07/27/2012   NA 134* 01/26/2013   K 4.7 01/26/2013   CL 100 01/26/2013   CREATININE 1.4* 01/26/2013   BUN 20 01/26/2013   CO2 27 01/26/2013   TSH 4.36 01/26/2013   INR 1.17 09/02/2011   HGBA1C 6.0 12/02/2008   Echo Study Conclusions  - Left ventricle: The cavity size was normal. Wall thickness was increased in a pattern of mild LVH. Systolic function was normal. The estimated ejection fraction was in the range of 60% to 65%. Wall motion was normal; there were no regional wall motion abnormalities. Doppler parameters are consistent with abnormal left ventricular relaxation (grade 1 diastolic dysfunction). - Aortic valve: Mild regurgitation. - Mitral valve: Mild regurgitation. - Left atrium: The atrium was mildly dilated. - Atrial septum: No defect or patent foramen ovale was identified.    Assessment / Plan: 1. CAD with prior NSTEMI with BMS to the OM1 - has residual 80% ostial RCA disease not treated. No significant chest pain reported. Would continue with current management.   2. HTN - BP quite elevated today. I suspect this is from salt. We  will increase her Diovan today to 160 mg. I will see her back in a month. Check BMET on return.   I will see her back in a month. Encouraged salt restriction. Check EKG today. She had  lots of ectopics on exam.   Patient is agreeable to this plan and will call if any problems develop in the interim.   Rosalio Macadamia, RN, ANP-C [facility] [address] [place], [place]  [address]

## 2013-05-10 ENCOUNTER — Ambulatory Visit (INDEPENDENT_AMBULATORY_CARE_PROVIDER_SITE_OTHER): Payer: Self-pay | Admitting: Family

## 2013-05-10 ENCOUNTER — Encounter: Payer: Self-pay | Admitting: Family

## 2013-05-10 VITALS — BP 138/80 | Temp 98.2°F | Wt 147.0 lb

## 2013-05-10 DIAGNOSIS — I10 Essential (primary) hypertension: Secondary | ICD-10-CM

## 2013-05-10 DIAGNOSIS — R51 Headache: Secondary | ICD-10-CM

## 2013-05-10 LAB — CBC WITH DIFFERENTIAL/PLATELET
Basophils Relative: 0.3 % (ref 0.0–3.0)
Eosinophils Relative: 1.2 % (ref 0.0–5.0)
HCT: 39.4 % (ref 36.0–46.0)
Hemoglobin: 13.1 g/dL (ref 12.0–15.0)
Lymphs Abs: 1.1 10*3/uL (ref 0.7–4.0)
MCV: 90.5 fl (ref 78.0–100.0)
Monocytes Absolute: 0.6 10*3/uL (ref 0.1–1.0)
Monocytes Relative: 10.3 % (ref 3.0–12.0)
Neutro Abs: 4 10*3/uL (ref 1.4–7.7)
WBC: 5.8 10*3/uL (ref 4.5–10.5)

## 2013-05-10 LAB — BASIC METABOLIC PANEL
BUN: 14 mg/dL (ref 6–23)
CO2: 28 mEq/L (ref 19–32)
Calcium: 9.3 mg/dL (ref 8.4–10.5)
Creatinine, Ser: 1.1 mg/dL (ref 0.4–1.2)
GFR: 61.76 mL/min (ref >60.00)
Glucose, Bld: 98 mg/dL (ref 70–99)

## 2013-05-10 MED ORDER — CLORAZEPATE DIPOTASSIUM 3.75 MG PO TABS: 3.7500 mg | ORAL_TABLET | ORAL | Status: DC

## 2013-05-10 MED ORDER — PAROXETINE HCL 10 MG PO TABS: ORAL_TABLET | ORAL | Status: DC

## 2013-05-10 NOTE — Progress Notes (Signed)
Subjective:    Patient ID: Becky Morales, female    DOB: [birth year], 79 y.o.   MRN: [id number]  HPI Pt is a 79 year old [place] American female who presents to the PCP for follow up regarding headaches. Pt states occur multiple times a week and can last for up to 3 days off and on. Pt localizes pain to the frontal region of the head and reports some radiation to the  R temporal region.  She reports the pain reaches 10/10 at times. Denies any aura, visual changes or dizziness with the onset of headaches. Upon last visit pt was started on Tramadol to help with the management of her headaches; but she reports no relief from this treatment. States the only relief she receives is from [employer]. Per pt and family, pt has been under a lot of stress recently due to the illness of a niece, who was initially the pt's caregiver. Both pt and family agree that stress could be a contributing factor to her chronic headaches. Pt also voices concern about being removed from her iron and potassium supplements and inquires what role this may play in her headaches.    Review of Systems  Constitutional: Negative.   Eyes: Negative.   Respiratory: Negative.   Cardiovascular: Negative.   Gastrointestinal: Negative.   Endocrine: Negative.   Genitourinary: Negative.   Musculoskeletal: Negative.   Skin: Negative.   Allergic/Immunologic: Negative.   Neurological: Positive for headaches.  Hematological: Negative.   Psychiatric/Behavioral: Negative.    Past Medical History  Diagnosis Date  . CARCINOID TUMOR 04/13/2007  . HYPERTENSION 04/13/2007  . ALLERGIC RHINITIS 04/13/2007  . GERD 04/13/2007  . HYPERGLYCEMIA 04/13/2007  . CHOLELITHIASIS 05/02/2010  . Arthritis   . Cancer   . Environmental allergies     has mucus in throat  . Diphtheria     age 79  . CAD (coronary artery disease)     NSTEMI.  LHC 09/03/11: pLAD 40-50%, pDx 80-90% (very small), lateral branch of OM 99%, m-dCFX 40%, oRCA 80%, m-dRCA 50-70%, EF  55%.  She had PCI with BMS to lateral branch of the OM.  RCA was tx medically.   . Hyponatremia   . Murmur     echo 11/12: EF 55-60%, mod calcified AV annulus, mild AI, mild MR    History   Social History  . Marital Status: Widowed    Spouse Name: N/A    Number of Children: N/A  . Years of Education: N/A   Occupational History  . Not on file.   Social History Main Topics  . Smoking status: Never Smoker   . Smokeless tobacco: Never Used  . Alcohol Use: No  . Drug Use: No  . Sexually Active: No   Other Topics Concern  . Not on file   Social History Narrative   Lives by herself, but has many family members that live in the area.    Past Surgical History  Procedure Laterality Date  . Sbo surgery    . Tonsillectomy      many years ago    Family History  Problem Relation Age of Onset  . Hypertension Mother   . Uterine cancer Mother   . Ovarian cancer Mother   . Hypertension Father   . Stroke Father   . Hypertension Brother   . Hyperlipidemia Brother   . Heart attack Brother   . Lung cancer Sister     Allergies  Allergen Reactions  . Ciprofloxacin  emesis    Current Outpatient Prescriptions on File Prior to Visit  Medication Sig Dispense Refill  . aspirin 81 MG tablet Take 81 mg by mouth daily.       Marland Kitchen atorvastatin (LIPITOR) 80 MG tablet take 1 tablet by mouth once daily  90 tablet  1  . calcium-vitamin D (OSCAL WITH D) 500-200 MG-UNIT per tablet Take 1 tablet by mouth daily.      . cetirizine (ZYRTEC) 10 MG tablet Take 10 mg by mouth daily.        . DimenhyDRINATE (MOTION SICKNESS PO) Take by mouth.      . levofloxacin (LEVAQUIN) 250 MG tablet 1 daily for 3 days  3 tablet  0  . magnesium oxide (MAG-OX) 400 MG tablet Take 400 mg by mouth daily.      . metoprolol tartrate (LOPRESSOR) 25 MG tablet Take 1 tablet (25 mg total) by mouth 2 (two) times daily.  180 tablet  3  . multivitamin (THERAGRAN) per tablet Take 1 tablet by mouth daily.       .  pantoprazole (PROTONIX) 40 MG tablet take 1 tablet by mouth once daily  90 tablet  1  . traMADol (ULTRAM) 50 MG tablet TAKE ONE TABLET BY MOUTH EVERY SIX HRS AS NEEDED  FOR PAIN.  30 tablet  3  . valsartan (DIOVAN) 160 MG tablet Take 1 tablet (160 mg total) by mouth daily.  30 tablet  6  . nitroGLYCERIN (NITROSTAT) 0.4 MG SL tablet Place 1 tablet (0.4 mg total) under the tongue every 5 (five) minutes as needed for chest pain.  30 tablet  12  . [DISCONTINUED] potassium chloride (K-DUR,KLOR-CON) 10 MEQ tablet Take 1 tablet (10 mEq total) by mouth daily.  30 tablet  8   No current facility-administered medications on file prior to visit.    BP 138/80  Temp(Src) 98.2 F (36.8 C) (Oral)  Wt 147 lb (66.679 kg)  BMI 22.67 kg/m2chart    Objective:   Physical Exam  Constitutional: She is oriented to person, place, and time. She appears well-developed and well-nourished.  HENT:  Head: Normocephalic and atraumatic.  Right Ear: External ear normal.  Left Ear: External ear normal.  Nose: Nose normal.  Mouth/Throat: Oropharynx is clear and moist.  Eyes: Conjunctivae are normal. Pupils are equal, round, and reactive to light.  Neck: Normal range of motion. Neck supple.  Cardiovascular: Normal rate and regular rhythm.   Pulmonary/Chest: Effort normal and breath sounds normal.  Abdominal: Soft. Bowel sounds are normal.  Musculoskeletal: Normal range of motion.  Neurological: She is alert and oriented to person, place, and time. She has normal reflexes. She displays normal reflexes. No cranial nerve deficit. Coordination normal.  Skin: Skin is warm and dry.  Psychiatric: She has a normal mood and affect.          Assessment & Plan:  1. Tension Headache 2. Acute stress reaction  Pt placed on Paxil to help with the management of her stress. CBC, ESR, BMP, Fe and TSH ordered today. Pt instructed to try to minimize her stress level, since this may be a causative factor to headache. Pt instructed  to follow up with PCP in 3-4 weeks of sooner if symptoms worsen.  Note by Davonna Belling, FNP Student

## 2013-05-10 NOTE — Patient Instructions (Addendum)

## 2013-05-19 ENCOUNTER — Ambulatory Visit: Payer: Medicare Other | Admitting: Nurse Practitioner

## 2013-06-07 ENCOUNTER — Ambulatory Visit: Payer: Medicare Other | Admitting: Family

## 2013-06-25 ENCOUNTER — Telehealth: Payer: Self-pay | Admitting: Cardiology

## 2013-06-25 NOTE — Telephone Encounter (Signed)
Pt having swelling in legs and feet, some chest pain, some SOB, pls advise [phone]

## 2013-06-25 NOTE — Telephone Encounter (Signed)
Patient complaining of chest pain that she describes as a gas pain, with no radiating pain. She is still staying tired, experiencing SOB and lower extremity swelling, especially in her feet. Last office visit with Norma Fredrickson NP states these same symptoms. She was to have a follow up appointment with Lawson Fiscal but had to cancel due to a family death. I explained that schedule was full for the next few weeks and that I would follow-up with Lawson Fiscal on Monday to see what options we might have on squeezing her in on schedule. I advised if symptoms get worse, experiences chest pain, or weight gain of 3-5 lbs overnight and symptomatic to go to ED. Patient is agreeable to plan.

## 2013-06-28 ENCOUNTER — Ambulatory Visit (INDEPENDENT_AMBULATORY_CARE_PROVIDER_SITE_OTHER): Payer: Medicare Other | Admitting: Nurse Practitioner

## 2013-06-28 ENCOUNTER — Encounter: Payer: Self-pay | Admitting: Nurse Practitioner

## 2013-06-28 VITALS — BP 170/80 | HR 56 | Ht 67.5 in | Wt 147.0 lb

## 2013-06-28 DIAGNOSIS — I10 Essential (primary) hypertension: Secondary | ICD-10-CM

## 2013-06-28 LAB — BASIC METABOLIC PANEL
BUN: 17 mg/dL (ref 6–23)
CO2: 29 mEq/L (ref 19–32)
Calcium: 9.4 mg/dL (ref 8.4–10.5)
Chloride: 99 mEq/L (ref 96–112)
Creatinine, Ser: 1 mg/dL (ref 0.4–1.2)
GFR: 63.81 mL/min (ref >60.00)
Glucose, Bld: 102 mg/dL — ABNORMAL HIGH (ref 70–99)
Potassium: 4.2 mEq/L (ref 3.5–5.1)
Sodium: 135 mEq/L (ref 135–145)

## 2013-06-28 MED ORDER — HYDROCHLOROTHIAZIDE 25 MG PO TABS: 25.0000 mg | ORAL_TABLET | Freq: Every day | ORAL | Status: DC

## 2013-06-28 NOTE — Progress Notes (Signed)
Niquita CHRYSTA FULCHER Date of Birth: [birth year] Medical Record #[id number]  History of Present Illness: Ms. Kinner is seen back today as a work in visit. Seen for Dr. Shirlee Latch. Has known CAD with past NSTEMI in November of 2012 with BMS to OM1 with residual RCA disease that was NOT treated. Has L-spine stenosis (not wanting surgery)), HTN, carcinoid noted from 03/01/2007, GERD and mild valvular heart disease.   I saw her back in June after an echo. She was ok. Using too much salt. We increased her Diovan for her elevated BP. She missed her follow up due to a death.   Called here on 03-01-23 with some gas pains, more swelling and some dyspnea. She was offered a visit for today.   Comes back today. Here with her friend, Stark Klein. She says she is doing better. Her swelling has improved. She still has some dyspnea. Some occasional chest pain. Still using too much salt. More upset about her niece dying - sounds like they took care of each other.   Current Outpatient Prescriptions  Medication Sig Dispense Refill  . aspirin 81 MG tablet Take 81 mg by mouth daily.       Marland Kitchen atorvastatin (LIPITOR) 80 MG tablet take 1 tablet by mouth once daily  90 tablet  1  . calcium-vitamin D (OSCAL WITH D) 500-200 MG-UNIT per tablet Take 1 tablet by mouth daily.      . cetirizine (ZYRTEC) 10 MG tablet Take 10 mg by mouth daily.        . clorazepate (TRANXENE) 3.75 MG tablet Take 1 tablet (3.75 mg total) by mouth as directed.  30 tablet  3  . DimenhyDRINATE (MOTION SICKNESS PO) Take by mouth.      . levofloxacin (LEVAQUIN) 250 MG tablet 1 daily for 3 days  3 tablet  0  . magnesium oxide (MAG-OX) 400 MG tablet Take 400 mg by mouth daily.      . metoprolol tartrate (LOPRESSOR) 25 MG tablet Take 1 tablet (25 mg total) by mouth 2 (two) times daily.  180 tablet  3  . multivitamin (THERAGRAN) per tablet Take 1 tablet by mouth daily.       . nitroGLYCERIN (NITROSTAT) 0.4 MG SL tablet Place 1 tablet (0.4 mg total) under the tongue every 5 (five)  minutes as needed for chest pain.  30 tablet  12  . pantoprazole (PROTONIX) 40 MG tablet take 1 tablet by mouth once daily  90 tablet  1  . traMADol (ULTRAM) 50 MG tablet TAKE ONE TABLET BY MOUTH EVERY SIX HRS AS NEEDED  FOR PAIN.  30 tablet  3  . valsartan (DIOVAN) 160 MG tablet Take 1 tablet (160 mg total) by mouth daily.  30 tablet  6  . [DISCONTINUED] potassium chloride (K-DUR,KLOR-CON) 10 MEQ tablet Take 1 tablet (10 mEq total) by mouth daily.  30 tablet  8   No current facility-administered medications for this visit.    Allergies  Allergen Reactions  . Ciprofloxacin     emesis    Past Medical History  Diagnosis Date  . CARCINOID TUMOR 04/13/2007  . HYPERTENSION 04/13/2007  . ALLERGIC RHINITIS 04/13/2007  . GERD 04/13/2007  . HYPERGLYCEMIA 04/13/2007  . CHOLELITHIASIS 05/02/2010  . Arthritis   . Cancer   . Environmental allergies     has mucus in throat  . Diphtheria     age 15  . CAD (coronary artery disease)     NSTEMI.  LHC 09/03/11: pLAD 40-50%, pDx 80-90% (  very small), lateral branch of OM 99%, m-dCFX 40%, oRCA 80%, m-dRCA 50-70%, EF 55%.  She had PCI with BMS to lateral branch of the OM.  RCA was tx medically.   . Hyponatremia   . Murmur     echo 11/12: EF 55-60%, mod calcified AV annulus, mild AI, mild MR    Past Surgical History  Procedure Laterality Date  . Sbo surgery    . Tonsillectomy      many years ago    History  Smoking status  . Never Smoker   Smokeless tobacco  . Never Used    History  Alcohol Use No    Family History  Problem Relation Age of Onset  . Hypertension Mother   . Uterine cancer Mother   . Ovarian cancer Mother   . Hypertension Father   . Stroke Father   . Hypertension Brother   . Hyperlipidemia Brother   . Heart attack Brother   . Lung cancer Sister     Review of Systems: The review of systems is per the HPI.  All other systems were reviewed and are negative.  Physical Exam: BP 170/80  Pulse 56  Ht 5' 7.5" (1.715 m)   Wt 147 lb (66.679 kg)  BMI 22.67 kg/m2 Patient is very pleasant and in no acute distress. Skin is warm and dry. Color is normal.  HEENT is unremarkable. Normocephalic/atraumatic. PERRL. Sclera are nonicteric. Neck is supple. No masses. No JVD. Lungs are clear. Cardiac exam shows a regular rate and rhythm. Abdomen is soft. Extremities are with 1+ edema. Gait and ROM are intact. No gross neurologic deficits noted.  LABORATORY DATA: Lab Results  Component Value Date   WBC 5.8 05/10/2013   HGB 13.1 05/10/2013   HCT 39.4 05/10/2013   PLT 449.0* 05/10/2013   GLUCOSE 98 05/10/2013   CHOL 123 07/27/2012   TRIG 51.0 07/27/2012   HDL 65.40 07/27/2012   LDLDIRECT 141.6 12/02/2008   LDLCALC 47 07/27/2012   ALT 28 07/27/2012   AST 33 07/27/2012   NA 133* 05/10/2013   K 4.0 05/10/2013   CL 98 05/10/2013   CREATININE 1.1 05/10/2013   BUN 14 05/10/2013   CO2 28 05/10/2013   TSH 6.37* 05/10/2013   INR 1.17 09/02/2011   HGBA1C 6.0 12/02/2008   Assessment / Plan: 1. HTN - repeat Bp by me is 170/70. I have started her on HCTZ - I think this will help her swelling and her BP improve. Try to restrict salt but this is a chronic issue.  2. Chest pain - managed conservatively.   We will check a BMET today. Start HCTZ. I will see her back in 3 to 4 weeks. Need to get her back established with Dr. Shirlee Latch as well.   Patient is agreeable to this plan and will call if any problems develop in the interim.   Rosalio Macadamia, RN, ANP-C [facility] [address] [place], [place]  [address]

## 2013-06-28 NOTE — Telephone Encounter (Signed)
She can come today at 2pm.

## 2013-06-28 NOTE — Patient Instructions (Addendum)
We will check lab today  I am starting you on HCTZ 25 mg to take one a day - this will help with your swelling and with your high blood pressure - the prescription is at the drug store  I will see you in 3 to 4 weeks  Try to restrict your salt as best you can  Call the [facility] office at [phone] if you have any questions, problems or concerns.

## 2013-07-12 ENCOUNTER — Encounter: Payer: Self-pay | Admitting: Podiatry

## 2013-07-12 DIAGNOSIS — B351 Tinea unguium: Secondary | ICD-10-CM | POA: Insufficient documentation

## 2013-07-22 ENCOUNTER — Ambulatory Visit: Payer: Self-pay | Admitting: Podiatry

## 2013-07-23 ENCOUNTER — Encounter: Payer: Self-pay | Admitting: Nurse Practitioner

## 2013-07-23 ENCOUNTER — Ambulatory Visit (INDEPENDENT_AMBULATORY_CARE_PROVIDER_SITE_OTHER): Payer: Medicare Other | Admitting: Nurse Practitioner

## 2013-07-23 VITALS — BP 180/80 | HR 56 | Ht 67.5 in | Wt 148.8 lb

## 2013-07-23 DIAGNOSIS — R5381 Other malaise: Secondary | ICD-10-CM

## 2013-07-23 DIAGNOSIS — I10 Essential (primary) hypertension: Secondary | ICD-10-CM

## 2013-07-23 DIAGNOSIS — I2510 Atherosclerotic heart disease of native coronary artery without angina pectoris: Secondary | ICD-10-CM

## 2013-07-23 MED ORDER — VALSARTAN 160 MG PO TABS: 160.0000 mg | ORAL_TABLET | Freq: Every day | ORAL | Status: DC

## 2013-07-23 NOTE — Patient Instructions (Addendum)
I think you are doing ok  Try to monitor your blood pressure at home for me  Stay on your current medicines  I have refilled the Diovan today  See Dr. Shirlee Latch in January  Call the [facility] office at [phone] if you have any questions, problems or concerns.

## 2013-07-23 NOTE — Progress Notes (Signed)
Becky Morales Date of Birth: 08-02-1920 Medical Record #[id number]  History of Present Illness: Becky Morales is seen back today for a one month check. Seen for Dr. Shirlee Latch. Has known CAD with past NSTEMI in November of 2012 with BMS to OM1 with residual RCA disease that was not treated. Has L-spine stenosis (not wanting surgery), HTN, carcinoid noted from 2008, GERD and mild valvular heart disease.   Seen back in June after an echo - we adjusted her medicines for her BP.   Seen last month - had some diffuse complaints but was felt to be doing ok.   Comes back today. Here with her nephew, Criss Alvine. She is doing ok. Feels pretty good for the most part. No falls. Right knee likes to give out on her. Not dizzy or lightheaded. No chest pain. Breathing ok. Checked her BP at home last week and it was ok. She has taken her medicines today.    Current Outpatient Prescriptions  Medication Sig Dispense Refill  . aspirin 81 MG tablet Take 81 mg by mouth daily.       Marland Kitchen atorvastatin (LIPITOR) 80 MG tablet take 1 tablet by mouth once daily  90 tablet  1  . calcium-vitamin D (OSCAL WITH D) 500-200 MG-UNIT per tablet Take 1 tablet by mouth daily.      . cetirizine (ZYRTEC) 10 MG tablet Take 10 mg by mouth daily.        . clorazepate (TRANXENE) 3.75 MG tablet Take 1 tablet (3.75 mg total) by mouth as directed.  30 tablet  3  . DimenhyDRINATE (MOTION SICKNESS PO) Take by mouth.      . hydrochlorothiazide (HYDRODIURIL) 25 MG tablet Take 1 tablet (25 mg total) by mouth daily.  30 tablet  6  . levofloxacin (LEVAQUIN) 250 MG tablet 1 daily for 3 days  3 tablet  0  . magnesium oxide (MAG-OX) 400 MG tablet Take 400 mg by mouth daily.      . metoprolol tartrate (LOPRESSOR) 25 MG tablet Take 1 tablet (25 mg total) by mouth 2 (two) times daily.  180 tablet  3  . multivitamin (THERAGRAN) per tablet Take 1 tablet by mouth daily.       . nitroGLYCERIN (NITROSTAT) 0.4 MG SL tablet Place 1 tablet (0.4 mg total) under the  tongue every 5 (five) minutes as needed for chest pain.  30 tablet  12  . pantoprazole (PROTONIX) 40 MG tablet take 1 tablet by mouth once daily  90 tablet  1  . traMADol (ULTRAM) 50 MG tablet TAKE ONE TABLET BY MOUTH EVERY SIX HRS AS NEEDED  FOR PAIN.  30 tablet  3  . valsartan (DIOVAN) 160 MG tablet Take 1 tablet (160 mg total) by mouth daily.  30 tablet  6  . [DISCONTINUED] potassium chloride (K-DUR,KLOR-CON) 10 MEQ tablet Take 1 tablet (10 mEq total) by mouth daily.  30 tablet  8   No current facility-administered medications for this visit.    Allergies  Allergen Reactions  . Ciprofloxacin     emesis    PMH:  1. L-spine stenosis: Patient does not want surgery.  2. CAD: NSTEMI 11/12. She had BMS to OM1. There was also 80% ostial RCA that was not treated. EF 55% on LV-gram.  3. HTN  4. Carcinoid 2008  5. GERD  6. Echo (11/12): EF 55-60%, mild AI, mild MR.    Past Medical History  Diagnosis Date  . CARCINOID TUMOR 04/13/2007  .  HYPERTENSION 04/13/2007  . ALLERGIC RHINITIS 04/13/2007  . GERD 04/13/2007  . HYPERGLYCEMIA 04/13/2007  . CHOLELITHIASIS 05/02/2010  . Arthritis   . Cancer   . Environmental allergies     has mucus in throat  . Diphtheria     age 63  . CAD (coronary artery disease)     NSTEMI.  LHC 09/03/11: pLAD 40-50%, pDx 80-90% (very small), lateral branch of OM 99%, m-dCFX 40%, oRCA 80%, m-dRCA 50-70%, EF 55%.  She had PCI with BMS to lateral branch of the OM.  RCA was tx medically.   . Hyponatremia   . Murmur     echo 11/12: EF 55-60%, mod calcified AV annulus, mild AI, mild MR  . Dermatophytosis of nail     Past Surgical History  Procedure Laterality Date  . Sbo surgery    . Tonsillectomy      many years ago    History  Smoking status  . Never Smoker   Smokeless tobacco  . Never Used    History  Alcohol Use No    Family History  Problem Relation Age of Onset  . Hypertension Mother   . Uterine cancer Mother   . Ovarian cancer Mother   .  Hypertension Father   . Stroke Father   . Hypertension Brother   . Hyperlipidemia Brother   . Heart attack Brother   . Lung cancer Sister     Review of Systems: The review of systems is per the HPI.  All other systems were reviewed and are negative.  Physical Exam: BP 180/80  Pulse 56  Ht 5' 7.5" (1.715 m)  Wt 148 lb 12.8 oz (67.495 kg)  BMI 22.95 kg/m2 Patient is very pleasant and in no acute distress. Skin is warm and dry. Color is normal.  HEENT is unremarkable. Normocephalic/atraumatic. PERRL. Sclera are nonicteric. Neck is supple. No masses. No JVD. Lungs are clear. Cardiac exam shows a regular rate and rhythm. Abdomen is soft. Extremities are without edema. Gait and ROM are intact. No gross neurologic deficits noted.  LABORATORY DATA: Lab Results  Component Value Date   WBC 5.8 05/10/2013   HGB 13.1 05/10/2013   HCT 39.4 05/10/2013   PLT 449.0* 05/10/2013   GLUCOSE 102* 06/28/2013   CHOL 123 07/27/2012   TRIG 51.0 07/27/2012   HDL 65.40 07/27/2012   LDLDIRECT 141.6 12/02/2008   LDLCALC 47 07/27/2012   ALT 28 07/27/2012   AST 33 07/27/2012   NA 135 06/28/2013   K 4.2 06/28/2013   CL 99 06/28/2013   CREATININE 1.0 06/28/2013   BUN 17 06/28/2013   CO2 29 06/28/2013   TSH 6.37* 05/10/2013   INR 1.17 09/02/2011   HGBA1C 6.0 12/02/2008    Assessment / Plan: 1. HTN - BP by me is down to 160/80. I have left her on her current regimen for now. I think overall she is doing ok.   2. CAD - managed conservatively - no symptoms reported.   3. Advanced age - safety is stressed to her.   Will get her back to see Dr. Shirlee Latch in December/January per her recall. No change in current regimen. Check labs on return.   Patient is agreeable to this plan and will call if any problems develop in the interim.   Rosalio Macadamia, RN, ANP-C [facility] [address] [place], [place]  [address]

## 2013-07-28 ENCOUNTER — Encounter: Payer: Self-pay | Admitting: Podiatry

## 2013-07-28 ENCOUNTER — Ambulatory Visit (INDEPENDENT_AMBULATORY_CARE_PROVIDER_SITE_OTHER): Payer: Medicare Other | Admitting: Podiatry

## 2013-07-28 VITALS — BP 118/64 | HR 66 | Resp 20 | Ht 67.0 in | Wt 147.0 lb

## 2013-07-28 DIAGNOSIS — B351 Tinea unguium: Secondary | ICD-10-CM

## 2013-07-29 NOTE — Progress Notes (Signed)
Subjective:     Patient ID: Becky Morales, female   DOB: 04/14/1920, 79 y.o.   MRN: [id number]  HPI patient states my nails are bothering me and I cannot cut them.   Review of Systems  All other systems reviewed and are negative.       Objective:   Physical Exam  Constitutional: She is oriented to person, place, and time.  Cardiovascular: Intact distal pulses.   Neurological: She is oriented to person, place, and time.  Skin: Skin is warm.   nail disease 1-5 bilateral with thick crusted type Betz     Assessment:     Mycotic nail infection 1-5 bilateral with pain.    Plan:     Cutting of painful toenails 1-5 bilateral. No iatrogenic bleeding noted

## 2013-08-17 ENCOUNTER — Telehealth: Payer: Self-pay | Admitting: Cardiology

## 2013-08-17 NOTE — Telephone Encounter (Signed)
Walk in pt Form " Disability Pla-Card" gave to Thurston Hole L 08/17/13/KM

## 2013-08-30 ENCOUNTER — Telehealth: Payer: Self-pay | Admitting: Cardiology

## 2013-08-30 NOTE — Telephone Encounter (Signed)
New Problem:  Pt states she has an appt on October 18, 2013... However, pt states she got a letter stating she needs a January appt. Pt would like to know if she needs to see Dr Shirlee Latch in December or January. Pt would like to be advised on her plan of care.

## 2013-08-30 NOTE — Telephone Encounter (Signed)
Pt advised to keep appt 10/18/13.

## 2013-09-09 ENCOUNTER — Ambulatory Visit: Payer: Self-pay | Admitting: Podiatry

## 2013-10-11 ENCOUNTER — Telehealth: Payer: Self-pay | Admitting: *Deleted

## 2013-10-11 NOTE — Telephone Encounter (Signed)
Opened in error

## 2013-10-12 ENCOUNTER — Ambulatory Visit (INDEPENDENT_AMBULATORY_CARE_PROVIDER_SITE_OTHER): Payer: Medicare Other | Admitting: Internal Medicine

## 2013-10-12 ENCOUNTER — Encounter: Payer: Self-pay | Admitting: Internal Medicine

## 2013-10-12 VITALS — BP 122/74 | Temp 98.2°F | Ht 67.0 in | Wt 143.0 lb

## 2013-10-12 DIAGNOSIS — Z23 Encounter for immunization: Secondary | ICD-10-CM

## 2013-10-12 MED ORDER — PANTOPRAZOLE SODIUM 40 MG PO TBEC: DELAYED_RELEASE_TABLET | ORAL | Status: DC

## 2013-10-12 MED ORDER — DM-GUAIFENESIN ER 30-600 MG PO TB12: 1.0000 | ORAL_TABLET | Freq: Two times a day (BID) | ORAL | Status: AC

## 2013-10-12 MED ORDER — NITROGLYCERIN 0.4 MG SL SUBL: 0.4000 mg | SUBLINGUAL_TABLET | SUBLINGUAL | Status: DC | PRN

## 2013-10-12 NOTE — Progress Notes (Signed)
Sinus congestion and cough for two weeks. Cough is rarely productive of clear mucous. No fiver or chills, no SOB. Has a slight right sided headache.   Ros: no other sxs  Exam BP 122/74  Temp(Src) 98.2 F (36.8 C) (Oral)  Ht 5\' 7"  (1.702 m)  Wt 143 lb (64.864 kg)  BMI 22.39 kg/m2  Well-developed well-nourished female in no acute distress. HEENT exam atraumatic, normocephalic, extraocular muscles are intact. Neck is supple. No jugular venous distention no thyromegaly. Chest clear to auscultation without increased work of breathing. Cardiac exam S1 and S2 are regular. Abdominal exam active bowel sounds, soft, nontender. Extremities no edema. Neurologic exam she is alert without any motor sensory deficits. Gait is normal.   URI symptoms- No fever Trial guaifenesin/dextromethorphan.  Call for fever or SOB

## 2013-10-12 NOTE — Progress Notes (Signed)
Pre visit review using our clinic review tool, if applicable. No additional management support is needed unless otherwise documented below in the visit note. 

## 2013-10-12 NOTE — Patient Instructions (Signed)
Call me for fever or chills or for shortness of breath.

## 2013-10-18 ENCOUNTER — Ambulatory Visit (INDEPENDENT_AMBULATORY_CARE_PROVIDER_SITE_OTHER): Payer: Medicare Other | Admitting: Cardiology

## 2013-10-18 ENCOUNTER — Encounter: Payer: Self-pay | Admitting: Cardiology

## 2013-10-18 VITALS — BP 132/62 | HR 73 | Ht 67.0 in | Wt 138.0 lb

## 2013-10-18 DIAGNOSIS — I2510 Atherosclerotic heart disease of native coronary artery without angina pectoris: Secondary | ICD-10-CM

## 2013-10-18 DIAGNOSIS — J209 Acute bronchitis, unspecified: Secondary | ICD-10-CM | POA: Insufficient documentation

## 2013-10-18 DIAGNOSIS — E785 Hyperlipidemia, unspecified: Secondary | ICD-10-CM

## 2013-10-18 LAB — CBC WITH DIFFERENTIAL/PLATELET
Basophils Absolute: 0 10*3/uL (ref 0.0–0.1)
Basophils Relative: 0.2 % (ref 0.0–3.0)
Eosinophils Relative: 0.5 % (ref 0.0–5.0)
HCT: 43.7 % (ref 36.0–46.0)
Lymphs Abs: 1.1 10*3/uL (ref 0.7–4.0)
MCV: 87.4 fl (ref 78.0–100.0)
Monocytes Absolute: 0.7 10*3/uL (ref 0.1–1.0)
Neutro Abs: 4.1 10*3/uL (ref 1.4–7.7)
Platelets: 481 10*3/uL — ABNORMAL HIGH (ref 150.0–400.0)
RBC: 5 Mil/uL (ref 3.87–5.11)
RDW: 17.9 % — ABNORMAL HIGH (ref 11.5–14.6)

## 2013-10-18 NOTE — Progress Notes (Signed)
Patient ID: Becky Morales, female   DOB: 1920-10-16, 79 y.o.   MRN: [id number] PCP: Dr. Cato Mulligan  79 yo with history of CAD s/p NSTEMI in 11/12 with BMS to OM1 presents for cardiology followup.  She still lives alone.  For the last approximately 2 weeks, she has had a cold and has felt bad.  No fever. She is congested with rhinorrhea.  She has been coughing (clear to white sputum).  She has had some lower chest pain that seems to be associated with coughing.  Over the last couple of days, she has improved a bit.  Weight is down 10 lbs.  She says that she has had no appetite since developing the URI.  No exertional chest pain.  She can walk around her house without problems but gets short of breath walking for longer distances.  No orthopnea/PND.   Labs (11/12): K 4, creatinine 1.1 Labs (1/13): LDL 41, HDL 67 Labs (3/13): K 4.2, creatinine 1.2 Labs (7/13): BNP 211 Labs (9/13): K 4.6, creatinine 1.1 Labs (10/13): LDL 47, HDL 67, LFTs normal Labs (9/56): K 4.2, creatinine 1.0, HCT 39.4  ECG: NSR, LVH  PMH: 1. L-spine stenosis: Patient does not want surgery.  2. CAD: NSTEMI 11/12.  She had BMS to OM1.  There was also 80% ostial RCA that was not treated.  EF 55% on LV-gram.  3. HTN 4. Carcinoid 2008 5. GERD 6. Echo (11/12): EF 55-60%, mild AI, mild MR.  Echo (4/14) with EF 60-65%, mild AI and MR.    SH: Never smoked, lives by herself in [place] but family checks on her.    FH: No premature CAD   Current Outpatient Prescriptions  Medication Sig Dispense Refill  . aspirin 81 MG tablet Take 81 mg by mouth daily.       Marland Kitchen atorvastatin (LIPITOR) 80 MG tablet take 1 tablet by mouth once daily  90 tablet  1  . calcium-vitamin D (OSCAL WITH D) 500-200 MG-UNIT per tablet Take 1 tablet by mouth daily.      . cetirizine (ZYRTEC) 10 MG tablet Take 10 mg by mouth daily.        . clorazepate (TRANXENE) 3.75 MG tablet Take 1 tablet (3.75 mg total) by mouth as directed.  30 tablet  3  .  dextromethorphan-guaiFENesin (MUCINEX DM) 30-600 MG per 12 hr tablet Take 1 tablet by mouth 2 (two) times daily.      . DimenhyDRINATE (MOTION SICKNESS PO) Take by mouth.      . hydrochlorothiazide (HYDRODIURIL) 25 MG tablet Take 1 tablet (25 mg total) by mouth daily.  30 tablet  6  . metoprolol tartrate (LOPRESSOR) 25 MG tablet Take 1 tablet (25 mg total) by mouth 2 (two) times daily.  180 tablet  3  . Multiple Vitamins-Minerals (CENTRUM SILVER PO) Take by mouth daily.      . nitroGLYCERIN (NITROSTAT) 0.4 MG SL tablet Place 1 tablet (0.4 mg total) under the tongue every 5 (five) minutes as needed for chest pain.  30 tablet  12  . pantoprazole (PROTONIX) 40 MG tablet take 1 tablet by mouth once daily  90 tablet  1  . traMADol (ULTRAM) 50 MG tablet TAKE ONE TABLET BY MOUTH EVERY SIX HRS AS NEEDED  FOR PAIN.  30 tablet  3  . valsartan (DIOVAN) 160 MG tablet Take 1 tablet (160 mg total) by mouth daily.  90 tablet  3  . [DISCONTINUED] potassium chloride (K-DUR,KLOR-CON) 10 MEQ tablet Take 1 tablet (  10 mEq total) by mouth daily.  30 tablet  8   No current facility-administered medications for this visit.   BP 132/62  Pulse 73  Ht 5\' 7"  (1.702 m)  Wt 62.596 kg (138 lb)  BMI 21.61 kg/m2 General: NAD, elderly Neck: No JVD, no thyromegaly or thyroid nodule.  Lungs: Clear to auscultation bilaterally with normal respiratory effort. CV: Nondisplaced PMI.  Heart regular S1/S2, no S3/S4,1/6 early SEM RUSB.  No peripheral edema.  Left carotid bruit.  Unable to palpate pedal pulses but no foot ulcerations.  Abdomen: Soft, nontender, no hepatosplenomegaly, no distention.  Neurologic: Alert and oriented x 3.  Psych: Normal affect. Extremities: No clubbing or cyanosis.   Assessmant/Plan:  CAD  Stable s/p NSTEMI. Rare atypical chest pain now.  I think that the pleuritic chest pain she has had recently is related to acute bronchitis.  She does fatigue easily in general. - Continue ASA 81, statin,  valsartan, metoprolol.   Hyperlipidemia  Check lipids today.   URI Patient has URI symptoms.  She has mild pleuritic chest pain that I think is likely related to bronchitis. Her lungs are clear on exam and she has not been febrile.  If she does not continue to improve over the next couple of days, she should contact Dr Cato Mulligan regarding starting an antibiotic.   Becky Morales 10/18/2013

## 2013-10-18 NOTE — Patient Instructions (Signed)
Your physician recommends that you continue on your current medications as directed. Please refer to the Current Medication list given to you today.  Labs today: lipid, cbc  Your physician wants you to follow-up in: 6  months You will receive a reminder letter in the mail two months in advance. If you don't receive a letter, please call our office to schedule the follow-up appointment.  If your cough has not gotten any better in 3-4 days please call Dr.Swords (your [employer]

## 2013-10-27 ENCOUNTER — Telehealth: Payer: Self-pay | Admitting: Internal Medicine

## 2013-10-27 NOTE — Telephone Encounter (Signed)
Patient Information:  Caller Name: Crissy  Phone: [phone]  Patient: Becky Morales, Becky Morales  Gender: Female  DOB: [birth year]  Age: 80 Years  PCP: Phoebe Sharps (Adults only)  Office Follow Up:  Does the office need to follow up with this patient?: Yes  Instructions For The Office: Patient requests antibiotics. She states no transportation as reason for not agreeing to schedule appointment.   Symptoms  Reason For Call & Symptoms: Patient calling.  Reports she was completed  Mucinex; She has continued cough and chest congestion.  Her cardiologist recommended antibiotics.  Cough onset 09/28/13 per notes in EMR.  Emergent symptoms ruled out.  See Within 3 Days in Office oper Cough protocol due to Cough has been present for > 10 days.  Reviewed Health History In EMR: Yes  Reviewed Medications In EMR: Yes  Reviewed Allergies In EMR: Yes  Reviewed Surgeries / Procedures: Yes  Date of Onset of Symptoms: 09/28/2013  Treatments Tried: Mucinex  Treatments Tried Worked: No  Guideline(s) Used:  Cough  Disposition Per Guideline:   See Within 3 Days in Office  Reason For Disposition Reached:   Cough has been present for > 10 days  Advice Given:  Reassurance  Coughing is the way that our lungs remove irritants and mucus. It helps protect our lungs from getting pneumonia.  Here is some care advice that should help.  Cough Medicines:  OTC Cough Drops: Cough drops can help a lot, especially for mild coughs. They reduce coughing by soothing your irritated throat and removing that tickle sensation in the back of the throat. Cough drops also have the advantage of portability - you can carry them with you.  Home Remedy - Hard Candy: Hard candy works just as well as medicine-flavored OTC cough drops. Diabetics should use sugar-free candy.  Coughing Spasms:  Drink warm fluids. Inhale warm mist (Reason: both relax the airway and loosen up the phlegm).  Prevent Dehydration:  Drink adequate liquids.  Call Back  If:  Difficulty breathing  You become worse.  Patient Refused Recommendation:  Patient Requests Prescription  Patient requests antibiotics. She states no transportation as reason for not agreeing to schedule appointment.

## 2013-10-28 ENCOUNTER — Ambulatory Visit: Payer: Medicare Other | Admitting: Podiatry

## 2013-10-28 ENCOUNTER — Other Ambulatory Visit: Payer: Self-pay | Admitting: Cardiology

## 2013-10-28 MED ORDER — DOXYCYCLINE HYCLATE 100 MG PO TABS: 100.0000 mg | ORAL_TABLET | Freq: Two times a day (BID) | ORAL | Status: DC

## 2013-10-28 NOTE — Telephone Encounter (Signed)
Try doxycycline 100 mg po bid for 7 days

## 2013-10-28 NOTE — Telephone Encounter (Signed)
rx sent in electronically, pt aware 

## 2013-12-16 ENCOUNTER — Ambulatory Visit: Payer: Medicare Other | Admitting: Podiatry

## 2013-12-20 ENCOUNTER — Other Ambulatory Visit: Payer: Self-pay | Admitting: Internal Medicine

## 2013-12-27 ENCOUNTER — Ambulatory Visit: Payer: Medicare Other | Admitting: Podiatry

## 2014-01-04 ENCOUNTER — Encounter: Payer: Self-pay | Admitting: Internal Medicine

## 2014-01-04 ENCOUNTER — Ambulatory Visit (INDEPENDENT_AMBULATORY_CARE_PROVIDER_SITE_OTHER): Payer: Medicare Other | Admitting: Internal Medicine

## 2014-01-04 VITALS — BP 135/76 | Temp 97.8°F | Wt 140.0 lb

## 2014-01-04 DIAGNOSIS — R5383 Other fatigue: Secondary | ICD-10-CM

## 2014-01-04 DIAGNOSIS — R5381 Other malaise: Secondary | ICD-10-CM

## 2014-01-04 DIAGNOSIS — D499 Neoplasm of unspecified behavior of unspecified site: Secondary | ICD-10-CM

## 2014-01-04 DIAGNOSIS — I10 Essential (primary) hypertension: Secondary | ICD-10-CM

## 2014-01-04 LAB — HEPATIC FUNCTION PANEL
ALBUMIN: 3.8 g/dL (ref 3.5–5.2)
ALT: 28 U/L (ref 0–35)
AST: 32 U/L (ref 0–37)
Alkaline Phosphatase: 98 U/L (ref 39–117)
Bilirubin, Direct: 0 mg/dL (ref 0.0–0.3)
Total Bilirubin: 0.9 mg/dL (ref 0.3–1.2)
Total Protein: 7.6 g/dL (ref 6.0–8.3)

## 2014-01-04 LAB — BASIC METABOLIC PANEL
BUN: 16 mg/dL (ref 6–23)
CO2: 24 meq/L (ref 19–32)
Calcium: 9.4 mg/dL (ref 8.4–10.5)
Chloride: 93 mEq/L — ABNORMAL LOW (ref 96–112)
Creatinine, Ser: 1 mg/dL (ref 0.4–1.2)
GFR: 66.68 mL/min (ref >60.00)
Glucose, Bld: 93 mg/dL (ref 70–99)
POTASSIUM: 4.4 meq/L (ref 3.5–5.1)
Sodium: 126 mEq/L — ABNORMAL LOW (ref 135–145)

## 2014-01-04 LAB — CBC WITH DIFFERENTIAL/PLATELET
BASOS PCT: 0.3 % (ref 0.0–3.0)
Basophils Absolute: 0 10*3/uL (ref 0.0–0.1)
EOS ABS: 0.1 10*3/uL (ref 0.0–0.7)
EOS PCT: 1.3 % (ref 0.0–5.0)
HEMATOCRIT: 43 % (ref 36.0–46.0)
Hemoglobin: 14.2 g/dL (ref 12.0–15.0)
LYMPHS ABS: 1.1 10*3/uL (ref 0.7–4.0)
Lymphocytes Relative: 20 % (ref 12.0–46.0)
MCHC: 33.1 g/dL (ref 30.0–36.0)
MCV: 92.2 fl (ref 78.0–100.0)
MONO ABS: 0.5 10*3/uL (ref 0.1–1.0)
Monocytes Relative: 9.5 % (ref 3.0–12.0)
Neutro Abs: 3.8 10*3/uL (ref 1.4–7.7)
Neutrophils Relative %: 68.9 % (ref 43.0–77.0)
Platelets: 495 10*3/uL — ABNORMAL HIGH (ref 150.0–400.0)
RBC: 4.66 Mil/uL (ref 3.87–5.11)
RDW: 22.2 % — ABNORMAL HIGH (ref 11.5–14.6)
WBC: 5.4 10*3/uL (ref 4.5–10.5)

## 2014-01-04 LAB — SEDIMENTATION RATE: Sed Rate: 15 mm/hr (ref 0–22)

## 2014-01-04 LAB — TSH: TSH: 2.18 u[IU]/mL (ref 0.35–5.50)

## 2014-01-04 NOTE — Progress Notes (Signed)
Her main complaint is generalized weakness. "don't feel good" Poor appetite She denies pain,  She denies N/V (i'm not sick to my stomach)  Anxiety - she has some relief with clorazepate  No other specific complaints  Reviewed pmh, psh, meds  Reviewed vitals Chest- cta, cv- reg rqate abd- soft, nt nd,  Ext- no edema  A/p fatigue / ? Cause- will check labs and then call patient

## 2014-01-04 NOTE — Progress Notes (Signed)
Pre visit review using our clinic review tool, if applicable. No additional management support is needed unless otherwise documented below in the visit note. 

## 2014-01-05 ENCOUNTER — Telehealth: Payer: Self-pay | Admitting: Internal Medicine

## 2014-01-05 NOTE — Telephone Encounter (Signed)
Relevant patient education mailed to patient.  

## 2014-01-11 ENCOUNTER — Ambulatory Visit: Payer: Medicare Other | Admitting: Internal Medicine

## 2014-01-11 ENCOUNTER — Telehealth: Payer: Self-pay | Admitting: Internal Medicine

## 2014-01-11 NOTE — Telephone Encounter (Signed)
Pt thought she was calling the drug store.  Went over medicines with pt.  Nothing further was needed

## 2014-01-11 NOTE — Telephone Encounter (Signed)
Pt received letter from [place] stating she needs to check w/ pcp about her BP, meds etc.  no name. Pt was in 3/17. Do we need to sch appt ?

## 2014-01-14 ENCOUNTER — Other Ambulatory Visit: Payer: Self-pay | Admitting: Family

## 2014-01-19 ENCOUNTER — Other Ambulatory Visit (INDEPENDENT_AMBULATORY_CARE_PROVIDER_SITE_OTHER): Payer: Medicare Other

## 2014-01-19 DIAGNOSIS — I10 Essential (primary) hypertension: Secondary | ICD-10-CM

## 2014-01-19 LAB — BASIC METABOLIC PANEL
BUN: 13 mg/dL (ref 6–23)
CALCIUM: 9.4 mg/dL (ref 8.4–10.5)
CO2: 28 mEq/L (ref 19–32)
Chloride: 92 mEq/L — ABNORMAL LOW (ref 96–112)
Creatinine, Ser: 1.2 mg/dL (ref 0.4–1.2)
GFR: 53.01 mL/min — ABNORMAL LOW (ref >60.00)
GLUCOSE: 97 mg/dL (ref 70–99)
POTASSIUM: 4.1 meq/L (ref 3.5–5.1)
Sodium: 128 mEq/L — ABNORMAL LOW (ref 135–145)

## 2014-01-24 ENCOUNTER — Encounter: Payer: Self-pay | Admitting: Podiatry

## 2014-01-24 ENCOUNTER — Ambulatory Visit (INDEPENDENT_AMBULATORY_CARE_PROVIDER_SITE_OTHER): Payer: Medicare Other | Admitting: Podiatry

## 2014-01-24 ENCOUNTER — Ambulatory Visit: Payer: Medicare Other | Admitting: Podiatry

## 2014-01-24 VITALS — BP 152/68 | HR 70 | Resp 19 | Ht 67.5 in | Wt 140.0 lb

## 2014-01-24 DIAGNOSIS — M79609 Pain in unspecified limb: Secondary | ICD-10-CM

## 2014-01-24 DIAGNOSIS — B351 Tinea unguium: Secondary | ICD-10-CM

## 2014-01-24 NOTE — Progress Notes (Signed)
Subjective:     Patient ID: Becky Morales, female   DOB: 1920-01-16, 80 y.o.   MRN: [id number]  HPI patient presents with thick nailbeds 1-5 both feet that she cannot take care of herself and they become painful as they grow out   Review of Systems     Objective:   Physical Exam Neurovascular status intact with no health history issues and nail disease and pain 1-5 both feet with brittle bats    Assessment:     Mycotic nail infection with pain 1-5 both feet    Plan:     Debridement painful nailbeds 1-5 both feet with no iatrogenic bleeding noted

## 2014-02-01 NOTE — Addendum Note (Signed)
Addended by: Monico Blitz T on: 02/01/2014 09:20 AM   Modules accepted: Orders

## 2014-02-06 ENCOUNTER — Emergency Department (HOSPITAL_COMMUNITY)
Admission: EM | Admit: 2014-02-06 | Discharge: 2014-02-06 | Disposition: A | Discharge status: Home / Self Care | Payer: Medicare Other | Attending: Emergency Medicine | Admitting: Emergency Medicine

## 2014-02-06 ENCOUNTER — Encounter (HOSPITAL_COMMUNITY): Payer: Self-pay | Admitting: Emergency Medicine

## 2014-02-06 ENCOUNTER — Emergency Department (HOSPITAL_COMMUNITY): Payer: Medicare Other

## 2014-02-06 ENCOUNTER — Other Ambulatory Visit (HOSPITAL_COMMUNITY): Payer: Medicare Other

## 2014-02-06 DIAGNOSIS — R05 Cough: Secondary | ICD-10-CM | POA: Insufficient documentation

## 2014-02-06 DIAGNOSIS — N390 Urinary tract infection, site not specified: Secondary | ICD-10-CM | POA: Insufficient documentation

## 2014-02-06 DIAGNOSIS — Z8619 Personal history of other infectious and parasitic diseases: Secondary | ICD-10-CM | POA: Insufficient documentation

## 2014-02-06 DIAGNOSIS — I10 Essential (primary) hypertension: Secondary | ICD-10-CM | POA: Insufficient documentation

## 2014-02-06 DIAGNOSIS — H7390 Unspecified disorder of tympanic membrane, unspecified ear: Secondary | ICD-10-CM | POA: Insufficient documentation

## 2014-02-06 DIAGNOSIS — R42 Dizziness and giddiness: Secondary | ICD-10-CM | POA: Insufficient documentation

## 2014-02-06 DIAGNOSIS — Z8639 Personal history of other endocrine, nutritional and metabolic disease: Secondary | ICD-10-CM | POA: Insufficient documentation

## 2014-02-06 DIAGNOSIS — R5383 Other fatigue: Principal | ICD-10-CM

## 2014-02-06 DIAGNOSIS — R059 Cough, unspecified: Secondary | ICD-10-CM | POA: Insufficient documentation

## 2014-02-06 DIAGNOSIS — R5381 Other malaise: Secondary | ICD-10-CM | POA: Insufficient documentation

## 2014-02-06 DIAGNOSIS — I2510 Atherosclerotic heart disease of native coronary artery without angina pectoris: Secondary | ICD-10-CM | POA: Insufficient documentation

## 2014-02-06 DIAGNOSIS — R531 Weakness: Secondary | ICD-10-CM

## 2014-02-06 DIAGNOSIS — Z79899 Other long term (current) drug therapy: Secondary | ICD-10-CM | POA: Insufficient documentation

## 2014-02-06 DIAGNOSIS — R011 Cardiac murmur, unspecified: Secondary | ICD-10-CM | POA: Insufficient documentation

## 2014-02-06 DIAGNOSIS — Z7982 Long term (current) use of aspirin: Secondary | ICD-10-CM | POA: Insufficient documentation

## 2014-02-06 DIAGNOSIS — I252 Old myocardial infarction: Secondary | ICD-10-CM | POA: Insufficient documentation

## 2014-02-06 DIAGNOSIS — Z862 Personal history of diseases of the blood and blood-forming organs and certain disorders involving the immune mechanism: Secondary | ICD-10-CM | POA: Insufficient documentation

## 2014-02-06 DIAGNOSIS — K219 Gastro-esophageal reflux disease without esophagitis: Secondary | ICD-10-CM | POA: Insufficient documentation

## 2014-02-06 DIAGNOSIS — I4949 Other premature depolarization: Secondary | ICD-10-CM | POA: Insufficient documentation

## 2014-02-06 DIAGNOSIS — Z85038 Personal history of other malignant neoplasm of large intestine: Secondary | ICD-10-CM | POA: Insufficient documentation

## 2014-02-06 DIAGNOSIS — M129 Arthropathy, unspecified: Secondary | ICD-10-CM | POA: Insufficient documentation

## 2014-02-06 LAB — CBC WITH DIFFERENTIAL/PLATELET
BASOS ABS: 0 10*3/uL (ref 0.0–0.1)
Basophils Relative: 1 % (ref 0–1)
Eosinophils Absolute: 0.1 10*3/uL (ref 0.0–0.7)
Eosinophils Relative: 1 % (ref 0–5)
HEMATOCRIT: 39.8 % (ref 36.0–46.0)
Hemoglobin: 14.4 g/dL (ref 12.0–15.0)
LYMPHS PCT: 17 % (ref 12–46)
Lymphs Abs: 1 10*3/uL (ref 0.7–4.0)
MCH: 32 pg (ref 26.0–34.0)
MCHC: 36.2 g/dL — ABNORMAL HIGH (ref 30.0–36.0)
MCV: 88.4 fL (ref 78.0–100.0)
Monocytes Absolute: 0.7 10*3/uL (ref 0.1–1.0)
Monocytes Relative: 12 % (ref 3–12)
NEUTROS ABS: 4.2 10*3/uL (ref 1.7–7.7)
Neutrophils Relative %: 71 % (ref 43–77)
PLATELETS: 427 10*3/uL — AB (ref 150–400)
RBC: 4.5 MIL/uL (ref 3.87–5.11)
RDW: 17.4 % — AB (ref 11.5–15.5)
WBC: 6 10*3/uL (ref 4.0–10.5)

## 2014-02-06 LAB — COMPREHENSIVE METABOLIC PANEL
ALT: 28 U/L (ref 0–35)
AST: 42 U/L — AB (ref 0–37)
Albumin: 3.5 g/dL (ref 3.5–5.2)
Alkaline Phosphatase: 111 U/L (ref 39–117)
BILIRUBIN TOTAL: 1.3 mg/dL — AB (ref 0.3–1.2)
BUN: 15 mg/dL (ref 6–23)
CHLORIDE: 95 meq/L — AB (ref 96–112)
CO2: 22 mEq/L (ref 19–32)
Calcium: 9.5 mg/dL (ref 8.4–10.5)
Creatinine, Ser: 0.99 mg/dL (ref 0.50–1.10)
GFR calc Af Amer: 56 mL/min — ABNORMAL LOW (ref >90)
GFR calc non Af Amer: 48 mL/min — ABNORMAL LOW (ref >90)
Glucose, Bld: 94 mg/dL (ref 70–99)
POTASSIUM: 5.1 meq/L (ref 3.7–5.3)
SODIUM: 131 meq/L — AB (ref 137–147)
Total Protein: 7.4 g/dL (ref 6.0–8.3)

## 2014-02-06 LAB — URINALYSIS, ROUTINE W REFLEX MICROSCOPIC
BILIRUBIN URINE: NEGATIVE
Glucose, UA: NEGATIVE mg/dL
HGB URINE DIPSTICK: NEGATIVE
Ketones, ur: 15 mg/dL — AB
Nitrite: NEGATIVE
PROTEIN: NEGATIVE mg/dL
SPECIFIC GRAVITY, URINE: 1.013 (ref 1.005–1.030)
UROBILINOGEN UA: 0.2 mg/dL (ref 0.0–1.0)
pH: 6 (ref 5.0–8.0)

## 2014-02-06 LAB — I-STAT TROPONIN, ED: Troponin i, poc: 0 ng/mL (ref 0.00–0.08)

## 2014-02-06 LAB — URINE MICROSCOPIC-ADD ON

## 2014-02-06 MED ORDER — CEPHALEXIN 500 MG PO CAPS: 500.0000 mg | ORAL_CAPSULE | Freq: Four times a day (QID) | ORAL | Status: DC

## 2014-02-06 NOTE — Discharge Instructions (Signed)
Please read and follow all provided instructions.  Your diagnoses today include:  1. Urinary tract infection   2. Generalized weakness     Tests performed today include:  Blood counts and electrolytes  Test for heart muscle damage  Chest x-ray - no pneumonia  Head CT - no stroke  Urine test - shows urinary tract infection  Vital signs. See below for your results today.   Medications prescribed:   Keflex (cephalexin) - antibiotic  You have been prescribed an antibiotic medicine: take the entire course of medicine even if you are feeling better. Stopping early can cause the antibiotic not to work.  Take any prescribed medications only as directed.  Home care instructions:  Follow any educational materials contained in this packet.  BE VERY CAREFUL not to take multiple medicines containing Tylenol (also called acetaminophen). Doing so can lead to an overdose which can damage your liver and cause liver failure and possibly death.   Follow-up instructions: Please follow-up with your primary care provider in the next 3 days for further evaluation of your symptoms. If you do not have a primary care doctor -- see below for referral information.   Return instructions:   Please return to the Emergency Department if you experience worsening symptoms.   Please return if you have any other emergent concerns.  Additional Information:  Your vital signs today were: BP 167/90   Pulse 97   Temp(Src) 98.7 F (37.1 C) (Oral)   Resp 9   Ht 5\' 7"  (1.702 m)   Wt 140 lb (63.504 kg)   BMI 21.92 kg/m2   SpO2 96% If your blood pressure (BP) was elevated above 135/85 this visit, please have this repeated by your doctor within one month. --------------

## 2014-02-06 NOTE — ED Provider Notes (Signed)
Medical screening examination/treatment/procedure(s) were conducted as a shared visit with non-physician practitioner(s) and myself.  I personally evaluated the patient during the encounter.   EKG Interpretation None     Patient with fatigue. Workup reassuring. Has ambulated will d/c  Jasper Riling. Alvino Chapel, MD 02/06/14 1921

## 2014-02-06 NOTE — ED Notes (Signed)
Patient brought by [facility] from home; she reports increasing weakness for two weeks. Nausea, no vomiting, no diarrhea. She says this morning she felt like she wanted to pass out, so she called EMS because it is Sunday and her doctor wouldn't be open. She reports she had no LOC, just felt really weak.

## 2014-02-06 NOTE — ED Provider Notes (Signed)
CSN: [id number]     Arrival date & time 02/06/14  1109 History   First MD Initiated Contact with Patient 02/06/14 1148     Chief Complaint  Patient presents with  . Weakness     (Consider location/radiation/quality/duration/timing/severity/associated sxs/prior Treatment) HPI Comments: Patient with history of MI, carcinoid tumor, hyponatremia -- presents with complaint of generalized weakness for several weeks. Weakness is described by the patient as feeling like she will fall out. This has been going on for a while but was worse this morning. Patient did not have full syncope. This sensation can occur at any time and is not related with activity. She denies chest pain or shortness of breath. She denies infectious symptoms including URI symptoms. She does not have nausea, vomiting, diarrhea, abdominal pain, urinary symptoms, skin rashes. Review of records shows the patient saw her primary care physician on 01/04/14 for similar complaints. She had labs which were unchanged. Patient called EMS this morning. She felt worse. No new medications. Onset of symptoms insidious. Course is constant.  Patient is a 80 y.o. female presenting with weakness. The history is provided by the patient and medical records.  Weakness Associated symptoms include coughing and weakness. Pertinent negatives include no abdominal pain, chest pain, fever, headaches, myalgias, nausea, rash, sore throat or vomiting.    Past Medical History  Diagnosis Date  . CARCINOID TUMOR 04/13/2007  . HYPERTENSION 04/13/2007  . ALLERGIC RHINITIS 04/13/2007  . GERD 04/13/2007  . HYPERGLYCEMIA 04/13/2007  . CHOLELITHIASIS 05/02/2010  . Arthritis   . Cancer   . Environmental allergies     has mucus in throat  . Diphtheria     age 50  . CAD (coronary artery disease)     NSTEMI.  LHC 09/03/11: pLAD 40-50%, pDx 80-90% (very small), lateral branch of OM 99%, m-dCFX 40%, oRCA 80%, m-dRCA 50-70%, EF 55%.  She had PCI with BMS to lateral branch of  the OM.  RCA was tx medically.   . Hyponatremia   . Murmur     echo 11/12: EF 55-60%, mod calcified AV annulus, mild AI, mild MR  . Dermatophytosis of nail    Past Surgical History  Procedure Laterality Date  . Sbo surgery    . Tonsillectomy      many years ago  . Cardiac surgery    . Colon surgery      CANCER   Family History  Problem Relation Age of Onset  . Hypertension Mother   . Uterine cancer Mother   . Ovarian cancer Mother   . Hypertension Father   . Stroke Father   . Hypertension Brother   . Hyperlipidemia Brother   . Heart attack Brother   . Lung cancer Sister    History  Substance Use Topics  . Smoking status: Never Smoker   . Smokeless tobacco: Never Used  . Alcohol Use: No   OB History   Grav Para Term Preterm Abortions TAB SAB Ect Mult Living                 Review of Systems  Constitutional: Negative for fever.  HENT: Negative for rhinorrhea and sore throat.   Eyes: Negative for redness.  Respiratory: Positive for cough. Negative for shortness of breath.   Cardiovascular: Negative for chest pain, palpitations and leg swelling.  Gastrointestinal: Negative for nausea, vomiting, abdominal pain and diarrhea.  Genitourinary: Negative for dysuria.  Musculoskeletal: Negative for myalgias.  Skin: Negative for rash.  Neurological: Positive for weakness and  light-headedness. Negative for headaches.      Allergies  Ciprofloxacin and Paxil  Home Medications   Prior to Admission medications   Medication Sig Start Date End Date Taking? Authorizing Provider  aspirin 81 MG tablet Take 81 mg by mouth daily.    Yes Historical Provider, MD  Aspirin-Acetaminophen-Caffeine (GOODY HEADACHE PO) Take 1 packet by mouth daily as needed (headache).   Yes Historical Provider, MD  atorvastatin (LIPITOR) 80 MG tablet take 1 tablet by mouth once daily 10/28/13  Yes Larey Dresser, MD  calcium carbonate (OS-CAL) 600 MG TABS tablet Take 600 mg by mouth daily.   Yes  Historical Provider, MD  Cetirizine HCl (ZYRTEC ALLERGY PO) Take 1 tablet by mouth daily as needed (allergies).    Yes Historical Provider, MD  IRON PO Take 1 tablet by mouth daily.   Yes Historical Provider, MD  metoprolol tartrate (LOPRESSOR) 25 MG tablet take 1 tablet by mouth twice a day 10/28/13  Yes Larey Dresser, MD  Multiple Vitamin (MULTIVITAMIN WITH MINERALS) TABS tablet Take 1 tablet by mouth daily.   Yes Historical Provider, MD  nitroGLYCERIN (NITROSTAT) 0.4 MG SL tablet Place 1 tablet (0.4 mg total) under the tongue every 5 (five) minutes as needed for chest pain. 10/12/13 05/26/16 Yes Lisabeth Pick, MD  pantoprazole (PROTONIX) 40 MG tablet take 1 tablet by mouth once daily 10/12/13  Yes Bruce Kendall Flack, MD  valsartan (DIOVAN) 160 MG tablet Take 1 tablet (160 mg total) by mouth daily. 07/23/13  Yes Burtis Junes, NP  clorazepate (TRANXENE) 3.75 MG tablet take 1 tablet by mouth as directed    Bruce Kendall Flack, MD   BP 169/70  Pulse 85  Temp(Src) 98.7 F (37.1 C) (Oral)  Resp 24  Ht 5\' 7"  (1.702 m)  Wt 140 lb (63.504 kg)  BMI 21.92 kg/m2  SpO2 99% Physical Exam  Nursing note and vitals reviewed. Constitutional: She is oriented to person, place, and time. She appears well-developed and well-nourished.  HENT:  Head: Normocephalic and atraumatic.  Right Ear: External ear and ear canal normal. Tympanic membrane is scarred (small outpouching center TM, does not appear infected or perforated).  Left Ear: Tympanic membrane, external ear and ear canal normal.  Nose: Nose normal. No mucosal edema or rhinorrhea.  Mouth/Throat: Uvula is midline, oropharynx is clear and moist and mucous membranes are normal. Mucous membranes are not dry. No oral lesions. No trismus in the jaw. No uvula swelling. No oropharyngeal exudate, posterior oropharyngeal edema, posterior oropharyngeal erythema or tonsillar abscesses.  Eyes: Conjunctivae, EOM and lids are normal. Pupils are equal, round, and reactive to  light. Right eye exhibits no discharge. Left eye exhibits no discharge. Right eye exhibits no nystagmus. Left eye exhibits no nystagmus.  Neck: Trachea normal and normal range of motion. Neck supple. Normal carotid pulses and no JVD present. No muscular tenderness present. Carotid bruit is not present. No tracheal deviation present.  Cardiovascular: Normal rate, regular rhythm, S1 normal, S2 normal, normal heart sounds and intact distal pulses.  Frequent extrasystoles are present. Exam reveals no decreased pulses.   No murmur heard. Pulmonary/Chest: Effort normal and breath sounds normal. No respiratory distress. She has no wheezes. She has no rales. She exhibits no tenderness.  Abdominal: Soft. Normal aorta and bowel sounds are normal. There is no tenderness. There is no rebound and no guarding.  Musculoskeletal: Normal range of motion.       Cervical back: She exhibits normal range of motion,  no tenderness and no bony tenderness.  Lymphadenopathy:    She has no cervical adenopathy.  Neurological: She is alert and oriented to person, place, and time. She has normal strength and normal reflexes. No cranial nerve deficit or sensory deficit. She displays a negative Romberg sign. Coordination and gait normal. GCS eye subscore is 4. GCS verbal subscore is 5. GCS motor subscore is 6.  Skin: Skin is warm and dry. She is not diaphoretic. No cyanosis. No pallor.  Psychiatric: She has a normal mood and affect.    ED Course  Procedures (including critical care time) Labs Review Labs Reviewed  CBC WITH DIFFERENTIAL - Abnormal; Notable for the following:    MCHC 36.2 (*)    RDW 17.4 (*)    Platelets 427 (*)    All other components within normal limits  COMPREHENSIVE METABOLIC PANEL - Abnormal; Notable for the following:    Sodium 131 (*)    Chloride 95 (*)    AST 42 (*)    Total Bilirubin 1.3 (*)    GFR calc non Af Amer 48 (*)    GFR calc Af Amer 56 (*)    All other components within normal limits   URINALYSIS, ROUTINE W REFLEX MICROSCOPIC - Abnormal; Notable for the following:    APPearance CLOUDY (*)    Ketones, ur 15 (*)    Leukocytes, UA MODERATE (*)    All other components within normal limits  URINE MICROSCOPIC-ADD ON - Abnormal; Notable for the following:    Squamous Epithelial / LPF FEW (*)    Bacteria, UA MANY (*)    All other components within normal limits  I-STAT TROPOININ, ED    Imaging Review Dg Chest 2 View  02/06/2014   CLINICAL DATA:  Shortness of breath.  Cough.  Weakness.  EXAM: CHEST  2 VIEW  COMPARISON:  05/11/2012  FINDINGS: The heart size and mediastinal contours are within normal limits. Both lungs are clear. No evidence of pleural effusion. No mass or lymphadenopathy identified. Thoraco lumbar spine degenerative disc disease again noted.  IMPRESSION: No active cardiopulmonary disease.   Electronically Signed   By: Earle Gell M.D.   On: 02/06/2014 12:37   Ct Head Wo Contrast  02/06/2014   CLINICAL DATA:  Weakness. Nausea. History carcinoid tumor and colon carcinoma.  EXAM: CT HEAD WITHOUT CONTRAST  TECHNIQUE: Contiguous axial images were obtained from the base of the skull through the vertex without intravenous contrast.  COMPARISON:  Sinus CT 05/09/2005  FINDINGS: Sinuses/Soft tissues: Surgical changes about the globes. Clear paranasal sinuses and mastoid air cells.  Intracranial: Moderate to marked low density in the periventricular white matter likely related to small vessel disease. Expected cerebral and cerebellar volume loss for age. No mass lesion, hemorrhage, hydrocephalus, acute infarct, intra-axial, or extra-axial fluid collection.  IMPRESSION: 1.  No acute intracranial abnormality. 2. Advanced small vessel ischemic change.   Electronically Signed   By: Abigail Miyamoto M.D.   On: 02/06/2014 13:51    12:02 PM Patient seen and examined. Work-up initiated. Medications ordered.   Vital signs reviewed and are as follows: Filed Vitals:   02/06/14 1130  BP:  169/70  Pulse: 85  Temp: 98.7 F (37.1 C)  Resp: 24    Date: 02/06/2014  Rate: 86  Rhythm: normal sinus rhythm and premature atrial contractions (PAC)  QRS Axis: normal  Intervals: normal  ST/T Wave abnormalities: normal  Conduction Disutrbances:none  Narrative Interpretation: new frequent ectopy  Old EKG Reviewed: changes  noted  3:31 PM Patient has done well during ED stay. She has ambulated in hall without problems. Exam is unchanged.   Patient has possible UTI on UA. Will treat.   Patient discussed with and seen by Dr. Alvino Chapel.   OK for d/c to home. Patient is comfortable with discharge to home. I encouraged her to see her doctor this week for a recheck of symptoms. Patient urged to return with worsening symptoms or other concerns. Patient verbalized understanding and agrees with plan.     MDM   Final diagnoses:  Urinary tract infection  Generalized weakness   Patient with generalized lightheadedness/weakness. No syncope or discrete episodes of lightheadedness verbalized. She is generally lightheaded. No focal neuro deficits. EKG shows some ectopy but no other findings concerning for arrhythmia. No signs of MI. Labs otherwise reassuring. Will treat possible UTI, culture sent. Patient appears well. She is not dizzy with standing. Feel patient safe for d/c, PCP f/u. She seems reliable to follow-up.   No dangerous or life-threatening conditions suspected or identified by history, physical exam, and by work-up. No indications for hospitalization identified.      Carlisle Cater, PA-C 02/06/14 1535

## 2014-02-06 NOTE — ED Notes (Signed)
Ambulated 40 ft in hall with cane. Tolerated well. Patient reports she feels "pretty good."

## 2014-02-06 NOTE — ED Notes (Signed)
Patient returned from Xray/CT.

## 2014-02-06 NOTE — ED Notes (Signed)
Patient transported to X-ray 

## 2014-03-07 ENCOUNTER — Ambulatory Visit (INDEPENDENT_AMBULATORY_CARE_PROVIDER_SITE_OTHER): Payer: Medicare Other | Admitting: Family Medicine

## 2014-03-07 ENCOUNTER — Encounter: Payer: Self-pay | Admitting: Family Medicine

## 2014-03-07 VITALS — BP 140/60 | HR 62 | Temp 98.1°F | Ht 67.0 in | Wt 137.0 lb

## 2014-03-07 DIAGNOSIS — R634 Abnormal weight loss: Secondary | ICD-10-CM

## 2014-03-07 DIAGNOSIS — R350 Frequency of micturition: Secondary | ICD-10-CM

## 2014-03-07 DIAGNOSIS — R5381 Other malaise: Secondary | ICD-10-CM

## 2014-03-07 DIAGNOSIS — R5383 Other fatigue: Secondary | ICD-10-CM

## 2014-03-07 NOTE — Progress Notes (Signed)
Pre visit review using our clinic review tool, if applicable. No additional management support is needed unless otherwise documented below in the visit note. 

## 2014-03-07 NOTE — Patient Instructions (Signed)
Follow up for any fever or burning with urination

## 2014-03-07 NOTE — Progress Notes (Signed)
Subjective:    Patient ID: Becky Morales, female    DOB: 01/25/1920, 80 y.o.   MRN: [id number]  HPI Patient seen for emergency room followup. Back in April she presented with some generalized weakness. She states actually she has had some generalized weakness since December. She attributes this to flu vaccine way back in December. She's had fairly extensive lab work including with recent ER visit. She had some leukocytes in her urine and was treated with Keflex but urine culture apparently was not sent. She states she feels slightly better now. She denies any burning with urination still some chronic frequency at times. No hematuria. No fevers or chills.  She states she has had generally poor appetite for several months and her weight is down from 143 pounds in December 2014 currently 137 pounds. However, she states her appetite has picked back up over the past few weeks. Denies any depression. No dizziness. No chest pains.  Past Medical History  Diagnosis Date  . CARCINOID TUMOR 04/13/2007  . HYPERTENSION 04/13/2007  . ALLERGIC RHINITIS 04/13/2007  . GERD 04/13/2007  . HYPERGLYCEMIA 04/13/2007  . CHOLELITHIASIS 05/02/2010  . Arthritis   . Cancer   . Environmental allergies     has mucus in throat  . Diphtheria     age 28  . CAD (coronary artery disease)     NSTEMI.  LHC 09/03/11: pLAD 40-50%, pDx 80-90% (very small), lateral branch of OM 99%, m-dCFX 40%, oRCA 80%, m-dRCA 50-70%, EF 55%.  She had PCI with BMS to lateral branch of the OM.  RCA was tx medically.   . Hyponatremia   . Murmur     echo 11/12: EF 55-60%, mod calcified AV annulus, mild AI, mild MR  . Dermatophytosis of nail    Past Surgical History  Procedure Laterality Date  . Sbo surgery    . Tonsillectomy      many years ago  . Cardiac surgery    . Colon surgery      CANCER    reports that she has never smoked. She has never used smokeless tobacco. She reports that she does not drink alcohol or use illicit drugs. family  history includes Heart attack in her brother; Hyperlipidemia in her brother; Hypertension in her brother, father, and mother; Lung cancer in her sister; Ovarian cancer in her mother; Stroke in her father; Uterine cancer in her mother. Allergies  Allergen Reactions  . Ciprofloxacin     emesis  . Paxil [Paroxetine Hcl] Nausea And Vomiting      Review of Systems  Constitutional: Negative for fever, chills and appetite change.  HENT: Negative for sore throat and trouble swallowing.   Respiratory: Negative for cough.   Cardiovascular: Negative for chest pain, palpitations and leg swelling.  Gastrointestinal: Negative for abdominal pain.  Genitourinary: Positive for frequency. Negative for dysuria and hematuria.  Neurological: Negative for headaches.       Objective:   Physical Exam  Constitutional: She appears well-developed and well-nourished. No distress.  Neck: Neck supple. No thyromegaly present.  Cardiovascular: Normal rate.   Pulmonary/Chest: Effort normal and breath sounds normal. No respiratory distress. She has no wheezes. She has no rales.  Musculoskeletal: She exhibits no edema.          Assessment & Plan:  #1 possible recent UTI. Repeat urinalysis today. Patient has vague symptoms of urinary frequency which may be more chronic #2 generalized fatigue. She's had recent labs which have been unrevealing #3 recent weight  loss.  Per pt weight has stabilized and appetite is improving.  Follow for now.

## 2014-03-29 ENCOUNTER — Encounter: Payer: Self-pay | Admitting: Family Medicine

## 2014-03-29 ENCOUNTER — Ambulatory Visit (INDEPENDENT_AMBULATORY_CARE_PROVIDER_SITE_OTHER): Payer: Medicare Other | Admitting: Family Medicine

## 2014-03-29 VITALS — BP 130/70 | HR 72 | Temp 97.6°F | Wt 134.0 lb

## 2014-03-29 DIAGNOSIS — R1084 Generalized abdominal pain: Secondary | ICD-10-CM

## 2014-03-29 DIAGNOSIS — R634 Abnormal weight loss: Secondary | ICD-10-CM

## 2014-03-29 DIAGNOSIS — R110 Nausea: Secondary | ICD-10-CM

## 2014-03-29 DIAGNOSIS — R197 Diarrhea, unspecified: Secondary | ICD-10-CM

## 2014-03-29 LAB — CBC WITH DIFFERENTIAL/PLATELET
Basophils Absolute: 0 10*3/uL (ref 0.0–0.1)
Basophils Relative: 0.5 % (ref 0.0–3.0)
EOS PCT: 1 % (ref 0.0–5.0)
Eosinophils Absolute: 0.1 10*3/uL (ref 0.0–0.7)
HEMATOCRIT: 44.5 % (ref 36.0–46.0)
Hemoglobin: 14.9 g/dL (ref 12.0–15.0)
LYMPHS ABS: 1 10*3/uL (ref 0.7–4.0)
Lymphocytes Relative: 18.6 % (ref 12.0–46.0)
MCHC: 33.4 g/dL (ref 30.0–36.0)
MCV: 95.6 fl (ref 78.0–100.0)
MONOS PCT: 12.7 % — AB (ref 3.0–12.0)
Monocytes Absolute: 0.7 10*3/uL (ref 0.1–1.0)
Neutro Abs: 3.6 10*3/uL (ref 1.4–7.7)
Neutrophils Relative %: 67.2 % (ref 43.0–77.0)
Platelets: 606 10*3/uL — ABNORMAL HIGH (ref 150.0–400.0)
RBC: 4.66 Mil/uL (ref 3.87–5.11)
RDW: 16.8 % — ABNORMAL HIGH (ref 11.5–15.5)
WBC: 5.4 10*3/uL (ref 4.0–10.5)

## 2014-03-29 LAB — COMPREHENSIVE METABOLIC PANEL
ALBUMIN: 3.5 g/dL (ref 3.5–5.2)
ALT: 32 U/L (ref 0–35)
AST: 37 U/L (ref 0–37)
Alkaline Phosphatase: 100 U/L (ref 39–117)
BILIRUBIN TOTAL: 0.9 mg/dL (ref 0.2–1.2)
BUN: 10 mg/dL (ref 6–23)
CHLORIDE: 90 meq/L — AB (ref 96–112)
CO2: 28 meq/L (ref 19–32)
Calcium: 9.7 mg/dL (ref 8.4–10.5)
Creatinine, Ser: 1.3 mg/dL — ABNORMAL HIGH (ref 0.4–1.2)
GFR: 50.58 mL/min — AB (ref >60.00)
Glucose, Bld: 84 mg/dL (ref 70–99)
POTASSIUM: 4.7 meq/L (ref 3.5–5.1)
SODIUM: 126 meq/L — AB (ref 135–145)
TOTAL PROTEIN: 7.2 g/dL (ref 6.0–8.3)

## 2014-03-29 NOTE — Patient Instructions (Signed)
Abdominal Pain, Adult °Many things can cause abdominal pain. Usually, abdominal pain is not caused by a disease and will improve without treatment. It can often be observed and treated at home. Your health care provider will do a physical exam and possibly order blood tests and X-rays to help determine the seriousness of your pain. However, in many cases, more time must pass before a clear cause of the pain can be found. Before that point, your health care provider may not know if you need more testing or further treatment. °HOME CARE INSTRUCTIONS  °Monitor your abdominal pain for any changes. The following actions may help to alleviate any discomfort you are experiencing: °· Only take over-the-counter or prescription medicines as directed by your health care provider. °· Do not take laxatives unless directed to do so by your health care provider. °· Try a clear liquid diet (broth, tea, or water) as directed by your health care provider. Slowly move to a bland diet as tolerated. °SEEK MEDICAL CARE IF: °· You have unexplained abdominal pain. °· You have abdominal pain associated with nausea or diarrhea. °· You have pain when you urinate or have a bowel movement. °· You experience abdominal pain that wakes you in the night. °· You have abdominal pain that is worsened or improved by eating food. °· You have abdominal pain that is worsened with eating fatty foods. °SEEK IMMEDIATE MEDICAL CARE IF:  °· Your pain does not go away within 2 hours. °· You have a fever. °· You keep throwing up (vomiting). °· Your pain is felt only in portions of the abdomen, such as the right side or the left lower portion of the abdomen. °· You pass bloody or black tarry stools. °MAKE SURE YOU: °· Understand these instructions.   °· Will watch your condition.   °· Will get help right away if you are not doing well or get worse.   °Document Released: 07/17/2005 Document Revised: 07/28/2013 Document Reviewed: 06/16/2013 °[employer]® Patient  Information ©2014 [employer]. ° °

## 2014-03-29 NOTE — Progress Notes (Signed)
Subjective:    Patient ID: Becky Morales, female    DOB: 01-Nov-1919, 80 y.o.   MRN: [id number]  Cough Pertinent negatives include no chest pain, chills or fever.   Patient is seen with nonspecific complaints of fatigue, decreased appetite, mild weight loss, intermittent poorly localized abdominal pain and some postprandial nausea and occasional vomiting.  Her past history is significant for carcinoid tumor with small bowel obstruction back in 2002. She had right colectomy and distal ileum resection back then. She's done well for several years. She relates for the past several months she's had about 3-4 times per week of postprandial nausea. She describes abdominal pain which is relatively mild periumbilical and somewhat right lower quadrant. She denies any bloody stools. No dysuria. No melena.  Patient was seen in ER back in April and had chest x-ray and CT head no acute findings. Her diarrhea is very intermittent. No recent constipation. She does have documented weight loss 147 pounds in July 2014 to 140 pounds in March of this year to current weight of 134 pounds.  Past Medical History  Diagnosis Date  . CARCINOID TUMOR 04/13/2007  . HYPERTENSION 04/13/2007  . ALLERGIC RHINITIS 04/13/2007  . GERD 04/13/2007  . HYPERGLYCEMIA 04/13/2007  . CHOLELITHIASIS 05/02/2010  . Arthritis   . Cancer   . Environmental allergies     has mucus in throat  . Diphtheria     age 45  . CAD (coronary artery disease)     NSTEMI.  LHC 09/03/11: pLAD 40-50%, pDx 80-90% (very small), lateral branch of OM 99%, m-dCFX 40%, oRCA 80%, m-dRCA 50-70%, EF 55%.  She had PCI with BMS to lateral branch of the OM.  RCA was tx medically.   . Hyponatremia   . Murmur     echo 11/12: EF 55-60%, mod calcified AV annulus, mild AI, mild MR  . Dermatophytosis of nail    Past Surgical History  Procedure Laterality Date  . Sbo surgery    . Tonsillectomy      many years ago  . Cardiac surgery    . Colon surgery      CANCER    reports that she has never smoked. She has never used smokeless tobacco. She reports that she does not drink alcohol or use illicit drugs. family history includes Heart attack in her brother; Hyperlipidemia in her brother; Hypertension in her brother, father, and mother; Lung cancer in her sister; Ovarian cancer in her mother; Stroke in her father; Uterine cancer in her mother. Allergies  Allergen Reactions  . Ciprofloxacin     emesis  . Paxil [Paroxetine Hcl] Nausea And Vomiting      Review of Systems  Constitutional: Positive for appetite change, fatigue and unexpected weight change. Negative for fever and chills.  HENT: Negative for trouble swallowing.   Respiratory: Positive for cough.   Cardiovascular: Negative for chest pain, palpitations and leg swelling.  Gastrointestinal: Positive for nausea, vomiting, abdominal pain and diarrhea. Negative for constipation, blood in stool and abdominal distention.  Genitourinary: Negative for dysuria.  Neurological: Negative for dizziness and syncope.  Psychiatric/Behavioral: Negative for confusion.       Objective:   Physical Exam  Constitutional: She appears well-developed and well-nourished. No distress.  HENT:  Mouth/Throat: Oropharynx is clear and moist.  Cardiovascular: Normal rate and regular rhythm.   Pulmonary/Chest: Effort normal and breath sounds normal. No respiratory distress. She has no wheezes. She has no rales.  Abdominal: Soft. Bowel sounds are normal. She  exhibits no distension and no mass. There is no rebound and no guarding.  Minimal tenderness right lower quadrant to deep palpation. No masses. No guarding or rebound.  Musculoskeletal: She exhibits no edema.          Assessment & Plan:  Patient presents with nonspecific symptoms of decreased appetite, modest weight loss, fatigue, poorly localized abdominal pain, intermittent diarrhea, and in postprandial nausea and vomiting which occurs somewhat inconsistently.  Remote history of carcinoid syndrome. No obstructive symptoms. Check labs with CBC and comprehensive metabolic panel. Schedule CT abdomen and pelvis to further assess. Consider 24 hour urine for 5 HIAA. Consider gastric emptying study if above negative.

## 2014-03-29 NOTE — Progress Notes (Signed)
Pre visit review using our clinic review tool, if applicable. No additional management support is needed unless otherwise documented below in the visit note. 

## 2014-04-01 ENCOUNTER — Ambulatory Visit (INDEPENDENT_AMBULATORY_CARE_PROVIDER_SITE_OTHER)
Admission: RE | Admit: 2014-04-01 | Discharge: 2014-04-01 | Disposition: A | Discharge status: Home / Self Care | Payer: Medicare Other | Source: Ambulatory Visit | Attending: Family Medicine | Admitting: Family Medicine

## 2014-04-01 DIAGNOSIS — R1084 Generalized abdominal pain: Secondary | ICD-10-CM

## 2014-04-01 DIAGNOSIS — R634 Abnormal weight loss: Secondary | ICD-10-CM

## 2014-04-01 MED ORDER — IOHEXOL 300 MG/ML  SOLN
80.0000 mL | Freq: Once | INTRAMUSCULAR | Status: AC | PRN
  Administered 2014-04-01: 80 mL via INTRAVENOUS

## 2014-04-03 NOTE — Addendum Note (Signed)
Addended by: Eulas Post on: 04/03/2014 02:13 PM   Modules accepted: Orders

## 2014-04-18 ENCOUNTER — Ambulatory Visit: Payer: Medicare Other | Admitting: Podiatry

## 2014-04-20 ENCOUNTER — Ambulatory Visit (HOSPITAL_COMMUNITY): Payer: Medicare Other

## 2014-05-04 ENCOUNTER — Encounter: Payer: Self-pay | Admitting: Family Medicine

## 2014-05-04 ENCOUNTER — Ambulatory Visit (INDEPENDENT_AMBULATORY_CARE_PROVIDER_SITE_OTHER): Payer: Medicare Other | Admitting: Family Medicine

## 2014-05-04 VITALS — BP 134/70 | HR 70 | Wt 136.0 lb

## 2014-05-04 DIAGNOSIS — B029 Zoster without complications: Secondary | ICD-10-CM

## 2014-05-04 MED ORDER — HYDROCODONE-ACETAMINOPHEN 5-325 MG PO TABS: 1.0000 | ORAL_TABLET | Freq: Four times a day (QID) | ORAL | Status: DC | PRN

## 2014-05-04 MED ORDER — VALACYCLOVIR HCL 1 G PO TABS: 1000.0000 mg | ORAL_TABLET | Freq: Three times a day (TID) | ORAL | Status: DC

## 2014-05-04 NOTE — Patient Instructions (Signed)
Shingles °Shingles (herpes zoster) is an infection that is caused by the same virus that causes chickenpox (varicella). The infection causes a painful skin rash and fluid-filled blisters, which eventually break open, crust over, and heal. It may occur in any area of the body, but it usually affects only one side of the body or face. The pain of shingles usually lasts about 1 month. However, some people with shingles may develop long-term (chronic) pain in the affected area of the body. °Shingles often occurs many years after the person had chickenpox. It is more common: °· In people older than 50 years. °· In people with weakened immune systems, such as those with HIV, AIDS, or cancer. °· In people taking medicines that weaken the immune system, such as transplant medicines. °· In people under great stress. °CAUSES  °Shingles is caused by the varicella zoster virus (VZV), which also causes chickenpox. After a person is infected with the virus, it can remain in the person's body for years in an inactive state (dormant). To cause shingles, the virus reactivates and breaks out as an infection in a nerve root. °The virus can be spread from person to person (contagious) through contact with open blisters of the shingles rash. It will only spread to people who have not had chickenpox. When these people are exposed to the virus, they may develop chickenpox. They will not develop shingles. Once the blisters scab over, the person is no longer contagious and cannot spread the virus to others. °SYMPTOMS  °Shingles shows up in stages. The initial symptoms may be pain, itching, and tingling in an area of the skin. This pain is usually described as burning, stabbing, or throbbing. In a few days or weeks, a painful red rash will appear in the area where the pain, itching, and tingling were felt. The rash is usually on one side of the body in a band or belt-like pattern. Then, the rash usually turns into fluid-filled blisters. They  will scab over and dry up in approximately 2-3 weeks. °Flu-like symptoms may also occur with the initial symptoms, the rash, or the blisters. These may include: °· Fever. °· Chills. °· Headache. °· Upset stomach. °DIAGNOSIS  °Your caregiver will perform a skin exam to diagnose shingles. Skin scrapings or fluid samples may also be taken from the blisters. This sample will be examined under a microscope or sent to a lab for further testing. °TREATMENT  °There is no specific cure for shingles. Your caregiver will likely prescribe medicines to help you manage the pain, recover faster, and avoid long-term problems. This may include antiviral drugs, anti-inflammatory drugs, and pain medicines. °HOME CARE INSTRUCTIONS  °· Take a cool bath or apply cool compresses to the area of the rash or blisters as directed. This may help with the pain and itching.   °· Only take over-the-counter or prescription medicines as directed by your caregiver.   °· Rest as directed by your caregiver. °· Keep your rash and blisters clean with mild soap and cool water or as directed by your caregiver.  °· Do not pick your blisters or scratch your rash. Apply an anti-itch cream or numbing creams to the affected area as directed by your caregiver. °· Keep your shingles rash covered with a loose bandage (dressing). °· Avoid skin contact with: °¨ Babies.   °¨ Pregnant women.   °¨ Children with eczema.   °¨ Elderly people with transplants.   °¨ People with chronic illnesses, such as leukemia or AIDS.   °· Wear loose-fitting clothing to help ease the   pain of material rubbing against the rash. °· Keep all follow-up appointments with your caregiver. If the area involved is on your face, you may receive a referral for follow-up to a specialist, such as an eye doctor (ophthalmologist) or an ear, nose, and throat (ENT) doctor. Keeping all follow-up appointments will help you avoid eye complications, chronic pain, or disability.   °SEEK IMMEDIATE MEDICAL  CARE IF:  °· You have facial pain, pain around the eye area, or loss of feeling on one side of your face. °· You have ear pain or ringing in your ear. °· You have loss of taste. °· Your pain is not relieved with prescribed medicines.   °· Your redness or swelling spreads.   °· You have more pain and swelling.  °· Your condition is worsening or has changed.   °· You have a fever or persistent symptoms for more than 2-3 days. °· You have a fever and your symptoms suddenly get worse. °MAKE SURE YOU: °· Understand these instructions. °· Will watch your condition. °· Will get help right away if you are not doing well or get worse. °Document Released: 10/07/2005 Document Revised: 07/01/2012 Document Reviewed: 05/21/2012 °[employer]® Patient Information ©2015 [employer]. This information is not intended to replace advice given to you by your health care provider. Make sure you discuss any questions you have with your health care provider. ° °

## 2014-05-04 NOTE — Progress Notes (Signed)
   Subjective:    Patient ID: Becky Morales, female    DOB: [birth year], 80 y.o.   MRN: [id number]  Rash Pertinent negatives include no fever.   Patient is seen as a work in with painful rash left lumbar area and radiating down left lower extremity. Just noticed last night. She has some itching but mostly pain. Moderate to severe at times. She tried an aspirin without much relief. Denies any generalized rash. Ambulating without difficulty. No fever or chills.  Past Medical History  Diagnosis Date  . CARCINOID TUMOR 04/13/2007  . HYPERTENSION 04/13/2007  . ALLERGIC RHINITIS 04/13/2007  . GERD 04/13/2007  . HYPERGLYCEMIA 04/13/2007  . CHOLELITHIASIS 05/02/2010  . Arthritis   . Cancer   . Environmental allergies     has mucus in throat  . Diphtheria     age 55  . CAD (coronary artery disease)     NSTEMI.  LHC 09/03/11: pLAD 40-50%, pDx 80-90% (very small), lateral branch of OM 99%, m-dCFX 40%, oRCA 80%, m-dRCA 50-70%, EF 55%.  She had PCI with BMS to lateral branch of the OM.  RCA was tx medically.   . Hyponatremia   . Murmur     echo 11/12: EF 55-60%, mod calcified AV annulus, mild AI, mild MR  . Dermatophytosis of nail    Past Surgical History  Procedure Laterality Date  . Sbo surgery    . Tonsillectomy      many years ago  . Cardiac surgery    . Colon surgery      CANCER    reports that she has never smoked. She has never used smokeless tobacco. She reports that she does not drink alcohol or use illicit drugs. family history includes Heart attack in her brother; Hyperlipidemia in her brother; Hypertension in her brother, father, and mother; Lung cancer in her sister; Ovarian cancer in her mother; Stroke in her father; Uterine cancer in her mother. Allergies  Allergen Reactions  . Ciprofloxacin     emesis  . Paxil [Paroxetine Hcl] Nausea And Vomiting      Review of Systems  Constitutional: Negative for fever and chills.  Skin: Positive for rash.       Objective:   Physical  Exam  Constitutional: She appears well-developed and well-nourished. No distress.  Cardiovascular: Normal rate and regular rhythm.   Pulmonary/Chest: Effort normal and breath sounds normal. No respiratory distress. She has no wheezes. She has no rales.  Skin: Rash noted.  Patient has vesicular rash with patches including left lumbar area, left buttock and in spreading a dermatome distribution down left anterior thigh          Assessment & Plan:  Shingles involving the left lower back and left lower extremity. Moderate associated pain. Valtrex 1 g 3 times a day for 7 days. Hydrocodone 5 mg one every 6 hours for severe pain. Caution for constipation and dizziness and sedation. Reassess one week

## 2014-05-04 NOTE — Progress Notes (Signed)
Pre visit review using our clinic review tool, if applicable. No additional management support is needed unless otherwise documented below in the visit note. 

## 2014-05-11 ENCOUNTER — Encounter: Payer: Self-pay | Admitting: Family Medicine

## 2014-05-11 ENCOUNTER — Ambulatory Visit (INDEPENDENT_AMBULATORY_CARE_PROVIDER_SITE_OTHER): Payer: Medicare Other | Admitting: Family Medicine

## 2014-05-11 VITALS — BP 132/72 | HR 66 | Temp 97.8°F | Wt 136.0 lb

## 2014-05-11 DIAGNOSIS — B029 Zoster without complications: Secondary | ICD-10-CM

## 2014-05-11 NOTE — Progress Notes (Signed)
Pre visit review using our clinic review tool, if applicable. No additional management support is needed unless otherwise documented below in the visit note. 

## 2014-05-11 NOTE — Progress Notes (Signed)
   Subjective:    Patient ID: Becky Morales, female    DOB: May 10, 1920, 80 y.o.   MRN: [id number]  HPI Followup regarding shingles. Patient had distribution left lower lumbar and down left lower extremity. She still has fairly severe pain at times which is improved with hydrocodone. She's also complaining of some itching. No weakness. Her pain comes and goes.  Past Medical History  Diagnosis Date  . CARCINOID TUMOR 04/13/2007  . HYPERTENSION 04/13/2007  . ALLERGIC RHINITIS 04/13/2007  . GERD 04/13/2007  . HYPERGLYCEMIA 04/13/2007  . CHOLELITHIASIS 05/02/2010  . Arthritis   . Cancer   . Environmental allergies     has mucus in throat  . Diphtheria     age 56  . CAD (coronary artery disease)     NSTEMI.  LHC 09/03/11: pLAD 40-50%, pDx 80-90% (very small), lateral branch of OM 99%, m-dCFX 40%, oRCA 80%, m-dRCA 50-70%, EF 55%.  She had PCI with BMS to lateral branch of the OM.  RCA was tx medically.   . Hyponatremia   . Murmur     echo 11/12: EF 55-60%, mod calcified AV annulus, mild AI, mild MR  . Dermatophytosis of nail    Past Surgical History  Procedure Laterality Date  . Sbo surgery    . Tonsillectomy      many years ago  . Cardiac surgery    . Colon surgery      CANCER    reports that she has never smoked. She has never used smokeless tobacco. She reports that she does not drink alcohol or use illicit drugs. family history includes Heart attack in her brother; Hyperlipidemia in her brother; Hypertension in her brother, father, and mother; Lung cancer in her sister; Ovarian cancer in her mother; Stroke in her father; Uterine cancer in her mother. Allergies  Allergen Reactions  . Ciprofloxacin     emesis  . Paxil [Paroxetine Hcl] Nausea And Vomiting      Review of Systems  Constitutional: Negative for fever and chills.  Skin: Positive for rash.       Objective:   Physical Exam  Constitutional: She appears well-developed and well-nourished.  Cardiovascular: Normal rate.     Pulmonary/Chest: Effort normal and breath sounds normal. No respiratory distress. She has no wheezes. She has no rales.  Skin: Rash noted.  Patient has shingles rash left lumbar area and also left anterior thigh following dermatome distribution. This is scabbed over with no signs of secondary infection          Assessment & Plan:  Shingles involving left lumbar and left lower extremity. Healing well. She is finishing out Valtrex. Continue hydrocodone as needed for pain relief. Measures to reduce constipation. Follow up as needed

## 2014-05-11 NOTE — Patient Instructions (Signed)
Shingles °Shingles (herpes zoster) is an infection that is caused by the same virus that causes chickenpox (varicella). The infection causes a painful skin rash and fluid-filled blisters, which eventually break open, crust over, and heal. It may occur in any area of the body, but it usually affects only one side of the body or face. The pain of shingles usually lasts about 1 month. However, some people with shingles may develop long-term (chronic) pain in the affected area of the body. °Shingles often occurs many years after the person had chickenpox. It is more common: °· In people older than 50 years. °· In people with weakened immune systems, such as those with HIV, AIDS, or cancer. °· In people taking medicines that weaken the immune system, such as transplant medicines. °· In people under great stress. °CAUSES  °Shingles is caused by the varicella zoster virus (VZV), which also causes chickenpox. After a person is infected with the virus, it can remain in the person's body for years in an inactive state (dormant). To cause shingles, the virus reactivates and breaks out as an infection in a nerve root. °The virus can be spread from person to person (contagious) through contact with open blisters of the shingles rash. It will only spread to people who have not had chickenpox. When these people are exposed to the virus, they may develop chickenpox. They will not develop shingles. Once the blisters scab over, the person is no longer contagious and cannot spread the virus to others. °SYMPTOMS  °Shingles shows up in stages. The initial symptoms may be pain, itching, and tingling in an area of the skin. This pain is usually described as burning, stabbing, or throbbing. In a few days or weeks, a painful red rash will appear in the area where the pain, itching, and tingling were felt. The rash is usually on one side of the body in a band or belt-like pattern. Then, the rash usually turns into fluid-filled blisters. They  will scab over and dry up in approximately 2-3 weeks. °Flu-like symptoms may also occur with the initial symptoms, the rash, or the blisters. These may include: °· Fever. °· Chills. °· Headache. °· Upset stomach. °DIAGNOSIS  °Your caregiver will perform a skin exam to diagnose shingles. Skin scrapings or fluid samples may also be taken from the blisters. This sample will be examined under a microscope or sent to a lab for further testing. °TREATMENT  °There is no specific cure for shingles. Your caregiver will likely prescribe medicines to help you manage the pain, recover faster, and avoid long-term problems. This may include antiviral drugs, anti-inflammatory drugs, and pain medicines. °HOME CARE INSTRUCTIONS  °· Take a cool bath or apply cool compresses to the area of the rash or blisters as directed. This may help with the pain and itching.   °· Only take over-the-counter or prescription medicines as directed by your caregiver.   °· Rest as directed by your caregiver. °· Keep your rash and blisters clean with mild soap and cool water or as directed by your caregiver.  °· Do not pick your blisters or scratch your rash. Apply an anti-itch cream or numbing creams to the affected area as directed by your caregiver. °· Keep your shingles rash covered with a loose bandage (dressing). °· Avoid skin contact with: °¨ Babies.   °¨ Pregnant women.   °¨ Children with eczema.   °¨ Elderly people with transplants.   °¨ People with chronic illnesses, such as leukemia or AIDS.   °· Wear loose-fitting clothing to help ease the   pain of material rubbing against the rash. °· Keep all follow-up appointments with your caregiver. If the area involved is on your face, you may receive a referral for follow-up to a specialist, such as an eye doctor (ophthalmologist) or an ear, nose, and throat (ENT) doctor. Keeping all follow-up appointments will help you avoid eye complications, chronic pain, or disability.   °SEEK IMMEDIATE MEDICAL  CARE IF:  °· You have facial pain, pain around the eye area, or loss of feeling on one side of your face. °· You have ear pain or ringing in your ear. °· You have loss of taste. °· Your pain is not relieved with prescribed medicines.   °· Your redness or swelling spreads.   °· You have more pain and swelling.  °· Your condition is worsening or has changed.   °· You have a fever or persistent symptoms for more than 2-3 days. °· You have a fever and your symptoms suddenly get worse. °MAKE SURE YOU: °· Understand these instructions. °· Will watch your condition. °· Will get help right away if you are not doing well or get worse. °Document Released: 10/07/2005 Document Revised: 07/01/2012 Document Reviewed: 05/21/2012 °[employer]® Patient Information ©2015 [employer]. This information is not intended to replace advice given to you by your health care provider. Make sure you discuss any questions you have with your health care provider. ° °

## 2014-05-19 ENCOUNTER — Telehealth: Payer: Self-pay | Admitting: Internal Medicine

## 2014-05-19 NOTE — Telephone Encounter (Signed)
Last visit 05/11/14 Last refill 05/04/14 #30 0 refill

## 2014-05-19 NOTE — Telephone Encounter (Signed)
Refill once.  I would encourage transfer to other providers who don't have full panels.

## 2014-05-19 NOTE — Telephone Encounter (Signed)
Pt states dr. Elease Hashimoto informed her to let him know if she needed another rx for HYDROcodone-acetaminophen (NORCO/VICODIN) 5-325 MG per tablet, pt states she is still in pain from shingles and would like another rx.   Also pt would like to know if she can continue to see him as a transfer from [facility].

## 2014-05-20 ENCOUNTER — Telehealth: Payer: Self-pay | Admitting: Internal Medicine

## 2014-05-20 MED ORDER — HYDROCODONE-ACETAMINOPHEN 5-325 MG PO TABS: 1.0000 | ORAL_TABLET | Freq: Four times a day (QID) | ORAL | Status: DC | PRN

## 2014-05-20 NOTE — Telephone Encounter (Signed)
Former [facility] patient, requesting to establish with [facility], please advise if ok to switch

## 2014-05-20 NOTE — Telephone Encounter (Signed)
Left detailed message Rx ready for pickup.

## 2014-05-20 NOTE — Telephone Encounter (Signed)
Eulas Post, MD at 05/19/2014 10:36 PM     Status: Signed        Refill once. I would encourage transfer to other providers who don't have full panels.

## 2014-05-20 NOTE — Telephone Encounter (Signed)
Left message on voicemail to call office. Rx ready for pickup. Rx printed and signed by Dr. Elease Hashimoto.

## 2014-05-27 ENCOUNTER — Other Ambulatory Visit: Payer: Self-pay | Admitting: Cardiology

## 2014-07-14 ENCOUNTER — Other Ambulatory Visit: Payer: Self-pay | Admitting: Cardiology

## 2014-07-21 ENCOUNTER — Ambulatory Visit (INDEPENDENT_AMBULATORY_CARE_PROVIDER_SITE_OTHER): Payer: Medicare Other | Admitting: Family Medicine

## 2014-07-21 ENCOUNTER — Encounter: Payer: Self-pay | Admitting: Family Medicine

## 2014-07-21 VITALS — BP 160/78 | HR 68 | Temp 97.8°F | Wt 136.0 lb

## 2014-07-21 DIAGNOSIS — E785 Hyperlipidemia, unspecified: Secondary | ICD-10-CM

## 2014-07-21 DIAGNOSIS — Z515 Encounter for palliative care: Secondary | ICD-10-CM

## 2014-07-21 DIAGNOSIS — R002 Palpitations: Secondary | ICD-10-CM

## 2014-07-21 DIAGNOSIS — I10 Essential (primary) hypertension: Secondary | ICD-10-CM

## 2014-07-21 NOTE — Assessment & Plan Note (Signed)
At end of visit discussed blood pressure recheck but patient stated she was ready to go. She stated she preferred to just wanted to go home and would check it with cardiology at next visit as it had been a long morning for her. Previous BPs were controlled and patient asymptomatic largely except for palpitaitons for with patient would not allow me to do an EKG. We discussed risks of leaving without this.

## 2014-07-21 NOTE — Assessment & Plan Note (Signed)
Discussed end of life goals. Patient does not formally have HCPOA and she is not interested in this. She does want to be DNR/DNI though. I signed a copy and gave it to her as well as have one to scan into chart.

## 2014-07-21 NOTE — Patient Instructions (Addendum)
I am sorry your heart seems to beat fast a few times a day. It sounds irregular on my exam today. We offered an EKG but you wanted to see Dr. Aundra Dubin to discuss.   Check in 4 weeks from now with me.   You stated that you would prefer to be DNR/DNI and signed a form today. We will scan a copy into your chart.

## 2014-07-21 NOTE — Assessment & Plan Note (Signed)
Previously well controlled. Patient ready to go home and did not want to get lbas today to update. Will plan to check at next visit.

## 2014-07-21 NOTE — Progress Notes (Signed)
Garret Reddish, MD Phone: [phone]  Subjective:  Patient presents today to establish care with me as their new primary care provider. Patient was formerly a patient of Dr. Leanne Chang. Chief complaint-noted.   Palpitations-new issue occasional palpitations since eating unhealthy foods recently. No chest pain or shortness of breath with episodes. Short lived and gone within a few seconds ROS-no chest pain, shortness of breath, left arm or neck pain, diaphoresis  Hyperlipidemia-controlled (likely though no recent LDL)  Lab Results  Component Value Date   LDLCALC 47 07/27/2012  On statin: atorvastatin ROS- no chest pain or shortness of breath. No myalgias  Hypertension-poor control  BP Readings from Last 3 Encounters:  07/21/14 160/78  05/11/14 132/72  05/04/14 134/70   Home BP monitoring-no Compliant with medications-yes without side effects ROS-Denies any CP, HA, SOB, blurry vision, dizziness   The following were reviewed and entered/updated in epic: Past Medical History  Diagnosis Date  . CARCINOID TUMOR 04/13/2007  . HYPERTENSION 04/13/2007  . ALLERGIC RHINITIS 04/13/2007  . GERD 04/13/2007  . HYPERGLYCEMIA 04/13/2007  . CHOLELITHIASIS 05/02/2010  . Arthritis   . Cancer   . Environmental allergies     has mucus in throat  . Diphtheria     age 80  . CAD (coronary artery disease)     NSTEMI.  LHC 09/03/11: pLAD 40-50%, pDx 80-90% (very small), lateral branch of OM 99%, m-dCFX 40%, oRCA 80%, m-dRCA 50-70%, EF 55%.  She had PCI with BMS to lateral branch of the OM.  RCA was tx medically.   . Hyponatremia   . Murmur     echo 11/12: EF 55-60%, mod calcified AV annulus, mild AI, mild MR  . Dermatophytosis of nail    Patient Active Problem List   Diagnosis Date Noted  . DNR/DNI 07/21/14 07/21/2014    Priority: High  . CAD (coronary artery disease)  s/p  PCI for NSTEMI 09/19/2011    Priority: High  . Hyperlipidemia 10/31/2011    Priority: Medium  . Spinal stenosis of lumbar  region at multiple levels 08/29/2011    Priority: Medium  . Malignant carcinoid tumor of unknown primary site 04/13/2007    Priority: Medium  . Essential hypertension 04/13/2007    Priority: Medium  . HYPERGLYCEMIA 04/13/2007    Priority: Medium  . Dermatophytosis of nail 07/12/2013    Priority: Low  . CHOLELITHIASIS 05/02/2010    Priority: Low  . ALLERGIC RHINITIS 04/13/2007    Priority: Low  . GERD 04/13/2007    Priority: Low   Past Surgical History  Procedure Laterality Date  . Sbo surgery    . Tonsillectomy      many years ago  . Cardiac surgery    . Colon surgery      CANCER    Family History  Problem Relation Age of Onset  . Hypertension Mother   . Uterine cancer Mother   . Ovarian cancer Mother   . Hypertension Father   . Stroke Father   . Hypertension Brother   . Hyperlipidemia Brother   . Heart attack Brother   . Lung cancer Sister     Medications- reviewed and updated Current Outpatient Prescriptions  Medication Sig Dispense Refill  . aspirin 81 MG tablet Take 81 mg by mouth daily.       Marland Kitchen atorvastatin (LIPITOR) 80 MG tablet take 1 tablet by mouth once daily  90 tablet  0  . calcium carbonate (OS-CAL) 600 MG TABS tablet Take 600 mg by mouth daily.      Marland Kitchen  Cetirizine HCl (ZYRTEC ALLERGY PO) Take 1 tablet by mouth daily as needed (allergies).       . IRON PO Take 1 tablet by mouth daily.      . metoprolol tartrate (LOPRESSOR) 25 MG tablet take 1 tablet by mouth twice a day  60 tablet  0  . Multiple Vitamin (MULTIVITAMIN WITH MINERALS) TABS tablet Take 1 tablet by mouth daily.      . nitroGLYCERIN (NITROSTAT) 0.4 MG SL tablet Place 1 tablet (0.4 mg total) under the tongue every 5 (five) minutes as needed for chest pain.  30 tablet  12  . pantoprazole (PROTONIX) 40 MG tablet take 1 tablet by mouth once daily  90 tablet  0  . Aspirin-Acetaminophen-Caffeine (GOODY HEADACHE PO) Take 1 packet by mouth daily as needed (headache).      . valsartan (DIOVAN) 160 MG  tablet Take 1 tablet (160 mg total) by mouth daily.  90 tablet  3  . [DISCONTINUED] potassium chloride (K-DUR,KLOR-CON) 10 MEQ tablet Take 1 tablet (10 mEq total) by mouth daily.  30 tablet  8   No current facility-administered medications for this visit.    Allergies-reviewed and updated Allergies  Allergen Reactions  . Ciprofloxacin     emesis  . Paxil [Paroxetine Hcl] Nausea And Vomiting    History   Social History  . Marital Status: Widowed    Spouse Name: N/A    Number of Children: N/A  . Years of Education: N/A   Social History Main Topics  . Smoking status: Never Smoker   . Smokeless tobacco: Never Used  . Alcohol Use: No  . Drug Use: No  . Sexual Activity: No   Other Topics Concern  . None   Social History Narrative   Widowed  Over 25 years ago. Lives by herself, but has many family members that live in the area. Friends to support as well.       Does not drive much since flu shot in 2014 when she said she got to feeling poorly   At home-cooks, clean, cloth, shops for self, handles finances      Hobbies: previously liked to travel, talk to friends, reading      Wants 1st cousin to help make decisions but does not want HCPOA Renea Ee and Kenyon Ana      DNR/DNI    ROS--See HPI   Objective: BP 160/78  Pulse 68  Temp(Src) 97.8 F (36.6 C)  Wt 136 lb (61.689 kg) Gen: NAD, resting comfortably, appears stated age Neck: no thyromegaly CV: irregularly irregular, no rubs or gallops Lungs: CTAB no crackles, wheeze, rhonchi Abdomen: soft/nontender/nondistended/normal bowel sounds.  Ext: 1+ edema bilaterally Skin: warm, dry, no rash Neuro: grossly normal, moves all extremities, PERRLA  Assessment/Plan:  Essential hypertension At end of visit discussed blood pressure recheck but patient stated she was ready to go. She stated she preferred to just wanted to go home and would check it with cardiology at next visit as it had been a long morning for  her. Previous BPs were controlled and patient asymptomatic largely except for palpitaitons for with patient would not allow me to do an EKG. We discussed risks of leaving without this.   Hyperlipidemia Previously well controlled. Patient ready to go home and did not want to get lbas today to update. Will plan to check at next visit.   DNR/DNI 07/21/14 Discussed end of life goals. Patient does not formally have HCPOA and she is not interested in  this. She does want to be DNR/DNI though. I signed a copy and gave it to her as well as have one to scan into chart.   Palpitations Irregularly irregular on exam (? PVCs vs. A fib). Offerred EKG but patient had strong preference for evaluation by Dr. Aundra Dubin so we set her up with a visit with him. Patient without palpitations at this time and rate controlled even if A. Fib. I do not think patient would be the best candidate for warfarin/noacs even if a fib and patient stated she likely would not want anticoagulation so I believe it is safe for her to go home and continue aspirin with close follow up by cardiology.   Also terrible experience with flu shot last year and declines  >50% of 35 minute office visit was spent on counseling and coordination of care

## 2014-08-03 ENCOUNTER — Encounter: Payer: Self-pay | Admitting: Cardiology

## 2014-08-03 ENCOUNTER — Ambulatory Visit (INDEPENDENT_AMBULATORY_CARE_PROVIDER_SITE_OTHER): Payer: Medicare Other | Admitting: Cardiology

## 2014-08-03 VITALS — BP 140/70 | HR 74 | Ht 67.0 in | Wt 134.0 lb

## 2014-08-03 DIAGNOSIS — I2510 Atherosclerotic heart disease of native coronary artery without angina pectoris: Secondary | ICD-10-CM

## 2014-08-03 DIAGNOSIS — E785 Hyperlipidemia, unspecified: Secondary | ICD-10-CM

## 2014-08-03 DIAGNOSIS — R002 Palpitations: Secondary | ICD-10-CM

## 2014-08-03 MED ORDER — PANTOPRAZOLE SODIUM 40 MG PO TBEC: 40.0000 mg | DELAYED_RELEASE_TABLET | Freq: Two times a day (BID) | ORAL | Status: DC

## 2014-08-03 MED ORDER — PRAVASTATIN SODIUM 40 MG PO TABS: 40.0000 mg | ORAL_TABLET | Freq: Every evening | ORAL | Status: DC

## 2014-08-03 NOTE — Patient Instructions (Addendum)
Stop atorvastatin.   Start pravastatin 40mg  daily.   Increase protonix to 40mg  two times a day.  Your physician recommends that you have  lab work today--BMET/BNP/CBCd/TSH.  Your physician has recommended that you wear a holter monitor. Holter monitors are medical devices that record the heart's electrical activity. Doctors most often use these monitors to diagnose arrhythmias. Arrhythmias are problems with the speed or rhythm of the heartbeat. The monitor is a small, portable device. You can wear one while you do your normal daily activities. This is usually used to diagnose what is causing palpitations/syncope (passing out). [facility] recommends that you schedule a follow-up appointment in: 1 month with Dr Aundra Dubin.   Your physician recommends that you return for a FASTING lipid profile /liver profile in 2 months.   RESTRICT YOUR FLUID INTAKE TO 2 LITERS PER DAY.

## 2014-08-04 DIAGNOSIS — R002 Palpitations: Secondary | ICD-10-CM | POA: Insufficient documentation

## 2014-08-04 LAB — BASIC METABOLIC PANEL
BUN: 21 mg/dL (ref 6–23)
CALCIUM: 9.5 mg/dL (ref 8.4–10.5)
CO2: 28 meq/L (ref 19–32)
Chloride: 98 mEq/L (ref 96–112)
Creatinine, Ser: 1.2 mg/dL (ref 0.4–1.2)
GFR: 51.96 mL/min — ABNORMAL LOW (ref >60.00)
Glucose, Bld: 106 mg/dL — ABNORMAL HIGH (ref 70–99)
Potassium: 4.8 mEq/L (ref 3.5–5.1)
SODIUM: 135 meq/L (ref 135–145)

## 2014-08-04 LAB — CBC WITH DIFFERENTIAL/PLATELET
BASOS ABS: 0 10*3/uL (ref 0.0–0.1)
Basophils Relative: 0.3 % (ref 0.0–3.0)
EOS PCT: 1.8 % (ref 0.0–5.0)
Eosinophils Absolute: 0.1 10*3/uL (ref 0.0–0.7)
HCT: 44.6 % (ref 36.0–46.0)
Hemoglobin: 14.7 g/dL (ref 12.0–15.0)
LYMPHS PCT: 18.8 % (ref 12.0–46.0)
Lymphs Abs: 1.1 10*3/uL (ref 0.7–4.0)
MCHC: 32.9 g/dL (ref 30.0–36.0)
MCV: 98.5 fl (ref 78.0–100.0)
Monocytes Absolute: 0.5 10*3/uL (ref 0.1–1.0)
Monocytes Relative: 8.9 % (ref 3.0–12.0)
Neutro Abs: 4.2 10*3/uL (ref 1.4–7.7)
Neutrophils Relative %: 70.2 % (ref 43.0–77.0)
PLATELETS: 448 10*3/uL — AB (ref 150.0–400.0)
RBC: 4.53 Mil/uL (ref 3.87–5.11)
RDW: 18.4 % — AB (ref 11.5–15.5)
WBC: 6 10*3/uL (ref 4.0–10.5)

## 2014-08-04 LAB — TSH: TSH: 5.02 u[IU]/mL — AB (ref 0.35–4.50)

## 2014-08-04 LAB — BRAIN NATRIURETIC PEPTIDE: Pro B Natriuretic peptide (BNP): 181 pg/mL — ABNORMAL HIGH (ref 0.0–100.0)

## 2014-08-04 NOTE — Progress Notes (Signed)
Patient ID: Becky Morales, female   DOB: 22-Nov-1919, 80 y.o.   MRN: [id number] PCP: Dr. Yong Channel  80 yo with history of CAD s/p NSTEMI in 11/12 with BMS to [place] presents for cardiology followup.  She still lives alone.  She has had frequent palpitations recently.  She feels her heart fluttering at times. She is sometimes lightheaded when this happens.  She fatigues easily but does not get particularly short of breath.  She has had myalgias in her arms and legs for a long time and wonders if this could be due to her statin.  She has been getting chest pain at night when she is lying down in bed.  She will get an acid taste in her mouth.   Labs (11/12): K 4, creatinine 1.1 Labs (1/13): LDL 41, HDL 67 Labs (3/13): K 4.2, creatinine 1.2 Labs (7/13): BNP 211 Labs (9/13): K 4.6, creatinine 1.1 Labs (10/13): LDL 47, HDL 67, LFTs normal Labs (9/14): K 4.2, creatinine 1.0, HCT 39.4 Labs (6/15): K 4.2, Na 126, creatinine 1.3, HCT 44.5  ECG: NSR, RAE  PMH: 1. L-spine stenosis: Patient does not want surgery.  2. CAD: NSTEMI 11/12.  She had BMS to [facility].  There was also 80% ostial RCA that was not treated.  EF 55% on LV-gram.  3. HTN 4. Carcinoid 2008 5. GERD 6. Echo (11/12): EF 55-60%, mild AI, mild MR.  Echo (4/14) with EF 60-65%, mild AI and MR.    SH: Never smoked, lives by herself in [place] but family checks on her.    FH: No premature CAD  ROS: All systems reviewed and negative except as per HPI.    Current Outpatient Prescriptions  Medication Sig Dispense Refill  . aspirin 81 MG tablet Take 81 mg by mouth daily.       . IRON PO Take 1 tablet by mouth daily.      . metoprolol tartrate (LOPRESSOR) 25 MG tablet take 1 tablet by mouth twice a day  60 tablet  0  . valsartan (DIOVAN) 160 MG tablet Take 1 tablet (160 mg total) by mouth daily.  90 tablet  3  . nitroGLYCERIN (NITROSTAT) 0.4 MG SL tablet Place 1 tablet (0.4 mg total) under the tongue every 5 (five) minutes as needed for chest pain.   30 tablet  12  . pantoprazole (PROTONIX) 40 MG tablet Take 1 tablet (40 mg total) by mouth 2 (two) times daily.  60 tablet  3  . pravastatin (PRAVACHOL) 40 MG tablet Take 1 tablet (40 mg total) by mouth every evening.  90 tablet  3  . [DISCONTINUED] potassium chloride (K-DUR,KLOR-CON) 10 MEQ tablet Take 1 tablet (10 mEq total) by mouth daily.  30 tablet  8   No current facility-administered medications for this visit.   BP 140/70  Pulse 74  Ht 5\' 7"  (1.702 m)  Wt 134 lb (60.782 kg)  BMI 20.98 kg/m2 General: NAD, elderly Neck: No JVD, no thyromegaly or thyroid nodule.  Lungs: Clear to auscultation bilaterally with normal respiratory effort. CV: Nondisplaced PMI.  Heart regular S1/S2, no S3/S4,1/6 early SEM RUSB.  1+ ankle edema.  Left carotid bruit.  Unable to palpate pedal pulses but no foot ulcerations.  Abdomen: Soft, nontender, no hepatosplenomegaly, no distention.  Neurologic: Alert and oriented x 3.  Psych: Normal affect. Extremities: No clubbing or cyanosis.   Assessment/Plan:  CAD  Stable s/p NSTEMI. Current chest pain that she gets at night when lying in bed sounds like  GERD.  She does fatigue easily in general. - Continue ASA 81, statin, valsartan, metoprolol.  - Increase her Protonix to bid to help with GERD symptoms.   Hyperlipidemia  Myalgias may be due to atorvastatin.  They are fairly significant.  I will have her stop atorvastatin and try pravastatin 40 mg daily with lipids/LFTs in 2 months.   Palpitations Frequent palpitations with occasional lightheadedness.  I will arrange for 24 hour holter monitor.  Fatigue CBC, TSH today.  Hyponatremia Na 126 most recently.  I will ask her to fluid restrict to < 2 L daily and will repeat BMET today.   Loralie Champagne 08/04/2014

## 2014-08-10 ENCOUNTER — Encounter (INDEPENDENT_AMBULATORY_CARE_PROVIDER_SITE_OTHER): Payer: Medicare Other

## 2014-08-10 ENCOUNTER — Encounter: Payer: Self-pay | Admitting: *Deleted

## 2014-08-10 ENCOUNTER — Telehealth: Payer: Self-pay

## 2014-08-10 DIAGNOSIS — R002 Palpitations: Secondary | ICD-10-CM

## 2014-08-10 DIAGNOSIS — I2510 Atherosclerotic heart disease of native coronary artery without angina pectoris: Secondary | ICD-10-CM

## 2014-08-10 DIAGNOSIS — E785 Hyperlipidemia, unspecified: Secondary | ICD-10-CM

## 2014-08-10 DIAGNOSIS — R6889 Other general symptoms and signs: Secondary | ICD-10-CM

## 2014-08-10 NOTE — Telephone Encounter (Signed)
Message copied by Brett Canales on Wed Aug 10, 2014  3:29 PM ------      Message from: Katrine Coho      Created: Tue Aug 09, 2014  5:23 PM       Unable to reach patient or emergency contact, no answer.      Please continue to try to contact her. Thanks.       ------

## 2014-08-10 NOTE — Progress Notes (Signed)
Patient ID: Becky Morales, female   DOB: [birth year], 80 y.o.   MRN: [id number] [facility] 24 hour holter monitor applied to patient.

## 2014-08-10 NOTE — Telephone Encounter (Signed)
Patient informed of labs. Will return 10/22 for repeat thyroid functions.

## 2014-08-11 ENCOUNTER — Other Ambulatory Visit (INDEPENDENT_AMBULATORY_CARE_PROVIDER_SITE_OTHER): Payer: Medicare Other | Admitting: *Deleted

## 2014-08-11 ENCOUNTER — Other Ambulatory Visit: Payer: Self-pay

## 2014-08-11 DIAGNOSIS — E785 Hyperlipidemia, unspecified: Secondary | ICD-10-CM

## 2014-08-11 DIAGNOSIS — I2510 Atherosclerotic heart disease of native coronary artery without angina pectoris: Secondary | ICD-10-CM

## 2014-08-11 DIAGNOSIS — R6889 Other general symptoms and signs: Secondary | ICD-10-CM

## 2014-08-11 DIAGNOSIS — R002 Palpitations: Secondary | ICD-10-CM

## 2014-08-11 LAB — T4, FREE: Free T4: 1.03 ng/dL (ref 0.60–1.60)

## 2014-08-11 LAB — T3, FREE: T3, Free: 2.4 pg/mL (ref 2.3–4.2)

## 2014-08-11 LAB — TSH: TSH: 6.03 u[IU]/mL — AB (ref 0.35–4.50)

## 2014-08-18 ENCOUNTER — Telehealth: Payer: Self-pay | Admitting: *Deleted

## 2014-08-18 NOTE — Telephone Encounter (Signed)
No answer

## 2014-08-18 NOTE — Telephone Encounter (Signed)
NA

## 2014-08-18 NOTE — Telephone Encounter (Signed)
08/17/14-Dr McLean reviewed monitor done 08/10/14:  No long pauses. Avg HR 67. Some PACs/blocked PAC.  08/18/14  I attempted to contact pt at [phone] -no answer.

## 2014-08-19 NOTE — Telephone Encounter (Signed)
Informed patient of monitor results per Dr. Aundra Dubin.  Patient asked about getting some medications "for her nerves." Instructed patient to ask her PCP. She st she has an appointment 11/5 and will ask then.

## 2014-08-19 NOTE — Telephone Encounter (Signed)
New message     Want monitor results 

## 2014-08-24 ENCOUNTER — Encounter: Payer: Self-pay | Admitting: Family Medicine

## 2014-08-24 ENCOUNTER — Ambulatory Visit (INDEPENDENT_AMBULATORY_CARE_PROVIDER_SITE_OTHER): Payer: Medicare Other | Admitting: Family Medicine

## 2014-08-24 VITALS — BP 140/60 | HR 64 | Temp 97.6°F | Wt 133.0 lb

## 2014-08-24 DIAGNOSIS — F411 Generalized anxiety disorder: Secondary | ICD-10-CM | POA: Insufficient documentation

## 2014-08-24 DIAGNOSIS — K219 Gastro-esophageal reflux disease without esophagitis: Secondary | ICD-10-CM

## 2014-08-24 DIAGNOSIS — R002 Palpitations: Secondary | ICD-10-CM

## 2014-08-24 DIAGNOSIS — E785 Hyperlipidemia, unspecified: Secondary | ICD-10-CM

## 2014-08-24 NOTE — Assessment & Plan Note (Signed)
Some anxiety due to living alone and age. I counseled patient on this. We made a plan to call her friends if she is anxious or nervous for support. Her friend who is with her today likes this idea. Patient used to be on benzodiazepines but we discussed the dangers of these at her age. Would opt for CBT with Richardo Priest as the next step. As I stated would prefer to avoid another medication

## 2014-08-24 NOTE — Patient Instructions (Addendum)
Anxiety  You opted to call anytime you feel anxious one of your loved ones  If this is not beneficial enough, we will check in a few months from now and see if you want to meet with Richardo Priest, a counselor in our office.   Heart Fluttering  Seemed to get better off of higher potency cholesterol medicine.   Continue pravastatin  Chest pain at night  Glad this is better on protonix.   Could trial off of this after you discuss with Dr. Aundra Dubin  No changes to medicine today.

## 2014-08-24 NOTE — Assessment & Plan Note (Signed)
Had an event monitor with some PACs. Patient states her palpitations improved coming off pravastatin. Currently every other day or daily only for a few seconds. Not bothering patient much. No changes today. I wonder if this slightly anxiety related

## 2014-08-24 NOTE — Assessment & Plan Note (Signed)
Change to pravastatin by cardiology. Patient states her palpitations have improved on this. She is also having fewer muscle aches. She is very pleased with the change. We will continue current medications. At next visit consider repeat lipids-want to make sure out at least 6 weeks since change

## 2014-08-24 NOTE — Assessment & Plan Note (Signed)
Chest pain at night improved with twice a day dosing of Protonix. Sometimes a short course of this will improve symptoms and then can go back to former dose. Asked patient to discuss with her cardiologist at next visit if she can go back to once daily dosing.

## 2014-08-24 NOTE — Progress Notes (Signed)
Garret Reddish, MD Phone: [phone]  Subjective:   Becky Morales is a 80 y.o. year old very pleasant female patient who presents with the following:  Anxiety Patient lives alone and tends to worry about her health and safety at times. Has been going on for a while especially after she became less functional after flu shot last year.  ROS- No SI/HI.   Hyperlipidemia-well controlled previously  palpitations-improving GERD-improved Changed to pravastatin by cardiology. Patient was having some myalgias and she states these have resolved almost completely on the pravastatin.she is also having palpitations a few times a day and she states she will have them perhaps for a few seconds every day or every other day. She is very pleased with this change. Finally, her chest pain at night has resolved with taking Protonix twice a day. ROS- no chest pain or shortness of breath. No myalgias  Past Medical History- Patient Active Problem List   Diagnosis Date Noted  . DNR/DNI 07/21/14 07/21/2014    Priority: High  . CAD (coronary artery disease)  s/p  PCI for NSTEMI 09/19/2011    Priority: High  . Anxiety state 08/24/2014    Priority: Medium  . Hyperlipidemia 10/31/2011    Priority: Medium  . Spinal stenosis of lumbar region at multiple levels 08/29/2011    Priority: Medium  . Malignant carcinoid tumor of unknown primary site 04/13/2007    Priority: Medium  . Essential hypertension 04/13/2007    Priority: Medium  . HYPERGLYCEMIA 04/13/2007    Priority: Medium  . Palpitations 08/04/2014    Priority: Low  . Dermatophytosis of nail 07/12/2013    Priority: Low  . CHOLELITHIASIS 05/02/2010    Priority: Low  . ALLERGIC RHINITIS 04/13/2007    Priority: Low  . GERD 04/13/2007    Priority: Low   Medications- reviewed and updated Current Outpatient Prescriptions  Medication Sig Dispense Refill  . aspirin 81 MG tablet Take 81 mg by mouth daily.     . IRON PO Take 1 tablet by mouth daily.      . metoprolol tartrate (LOPRESSOR) 25 MG tablet take 1 tablet by mouth twice a day 60 tablet 0  . pantoprazole (PROTONIX) 40 MG tablet Take 1 tablet (40 mg total) by mouth 2 (two) times daily. 60 tablet 3  . pravastatin (PRAVACHOL) 40 MG tablet Take 1 tablet (40 mg total) by mouth every evening. 90 tablet 3  . valsartan (DIOVAN) 160 MG tablet Take 1 tablet (160 mg total) by mouth daily. 90 tablet 3  . nitroGLYCERIN (NITROSTAT) 0.4 MG SL tablet Place 1 tablet (0.4 mg total) under the tongue every 5 (five) minutes as needed for chest pain. 30 tablet 12  . [DISCONTINUED] potassium chloride (K-DUR,KLOR-CON) 10 MEQ tablet Take 1 tablet (10 mEq total) by mouth daily. 30 tablet 8   No current facility-administered medications for this visit.    Objective: BP 140/60 mmHg  Pulse 64  Temp(Src) 97.6 F (36.4 C)  Wt 133 lb (60.328 kg) Gen: NAD, resting comfortably CV: RRR no murmurs rubs or gallops Lungs: CTAB no crackles, wheeze, rhonchi Ext: 1+ edema Skin: warm, dry, no rash Neuro: grossly normal, moves all extremities   Assessment/Plan:  Hyperlipidemia Change to pravastatin by cardiology. Patient states her palpitations have improved on this. She is also having fewer muscle aches. She is very pleased with the change. We will continue current medications. At next visit consider repeat lipids-want to make sure out at least 6 weeks since change  Palpitations  Had an event monitor with some PACs. Patient states her palpitations improved coming off pravastatin. Currently every other day or daily only for a few seconds. Not bothering patient much. No changes today. I wonder if this slightly anxiety related  GERD Chest pain at night improved with twice a day dosing of Protonix. Sometimes a short course of this will improve symptoms and then can go back to former dose. Asked patient to discuss with her cardiologist at next visit if she can go back to once daily dosing.  Anxiety state Some anxiety  due to living alone and age. I counseled patient on this. We made a plan to call her friends if she is anxious or nervous for support. Her friend who is with her today likes this idea. Patient used to be on benzodiazepines but we discussed the dangers of these at her age. Would opt for CBT with Richardo Priest as the next step. As I stated would prefer to avoid another medication   Return precautions advised.   >50% of 25 minute office visit was spent on counseling (anxiety, concerns about age and health-think patient is doing very well for 22) and coordination of care

## 2014-08-26 ENCOUNTER — Other Ambulatory Visit: Payer: Self-pay | Admitting: Cardiology

## 2014-08-26 MED ORDER — VALSARTAN 160 MG PO TABS: 160.0000 mg | ORAL_TABLET | Freq: Every day | ORAL | Status: DC

## 2014-09-05 ENCOUNTER — Encounter: Payer: Self-pay | Admitting: *Deleted

## 2014-09-08 ENCOUNTER — Ambulatory Visit (INDEPENDENT_AMBULATORY_CARE_PROVIDER_SITE_OTHER): Payer: Medicare Other | Admitting: Cardiology

## 2014-09-08 ENCOUNTER — Encounter: Payer: Self-pay | Admitting: Cardiology

## 2014-09-08 VITALS — BP 132/82 | HR 58 | Ht 67.0 in | Wt 130.4 lb

## 2014-09-08 DIAGNOSIS — E785 Hyperlipidemia, unspecified: Secondary | ICD-10-CM

## 2014-09-08 DIAGNOSIS — I2510 Atherosclerotic heart disease of native coronary artery without angina pectoris: Secondary | ICD-10-CM

## 2014-09-08 MED ORDER — METOPROLOL TARTRATE 25 MG PO TABS
25.0000 mg | ORAL_TABLET | Freq: Two times a day (BID) | ORAL | Status: DC
Start: 2014-09-08 — End: 2015-02-24

## 2014-09-08 MED ORDER — VALSARTAN 160 MG PO TABS
160.0000 mg | ORAL_TABLET | Freq: Every day | ORAL | Status: DC
Start: 2014-09-08 — End: 2015-02-24

## 2014-09-08 NOTE — Patient Instructions (Signed)
Start pravastatin 40mg  daily at bedtime.   Your physician recommends that you return for a FASTING lipid profile /liver profile in 2 months.   Your physician wants you to follow-up in: 6 months with Dr Aundra Dubin. (May 2016). You will receive a reminder letter in the mail two months in advance. If you don't receive a letter, please call our office to schedule the follow-up appointment.

## 2014-09-09 NOTE — Progress Notes (Signed)
Patient ID: Becky Morales, female   DOB: 09/02/1920, 80 y.o.   MRN: [id number] PCP: Dr. Yong Channel  80 yo with history of CAD s/p NSTEMI in 11/12 with BMS to [place] presents for cardiology followup.  She still lives alone.  She fatigues easily but does not get particularly short of breath.  At last appointment, she had been getting chest pain at night when she was lying down in bed.  I started her on Protonix, which seems to have resolved this symptom.  No further chest pain.  She stopped atorvastatin due to myalgias.  This seemed to help.  I started her on on pravastatin.  She stopped this because it seemed to make her legs swell.    Labs (11/12): K 4, creatinine 1.1 Labs (1/13): LDL 41, HDL 67 Labs (3/13): K 4.2, creatinine 1.2 Labs (7/13): BNP 211 Labs (9/13): K 4.6, creatinine 1.1 Labs (10/13): LDL 47, HDL 67, LFTs normal Labs (9/14): K 4.2, creatinine 1.0, HCT 39.4 Labs (6/15): K 4.2, Na 126, creatinine 1.3, HCT 44.5 Labs (10/15): elevated TSH but free T3 and free T4 normal, K 4.8, creatinine 1.2, BNP 181, Na 135  PMH: 1. L-spine stenosis: Patient does not want surgery.  2. CAD: NSTEMI 11/12.  She had BMS to [facility].  There was also 80% ostial RCA that was not treated.  EF 55% on LV-gram.  3. HTN 4. Carcinoid 2008 5. GERD 6. Echo (11/12): EF 55-60%, mild AI, mild MR.  Echo (4/14) with EF 60-65%, mild AI and MR.   7. Hyperlipidemia: Myalgias with atorvastatin.  8. Palpitations: Holter (10/15) with occasional PACs and blocked PACs.   SH: Never smoked, lives by herself in [place] but family checks on her.    FH: No premature CAD  ROS: All systems reviewed and negative except as per HPI.    Current Outpatient Prescriptions  Medication Sig Dispense Refill  . aspirin 81 MG tablet Take 81 mg by mouth daily.     . IRON PO Take 1 tablet by mouth daily.    . metoprolol tartrate (LOPRESSOR) 25 MG tablet Take 1 tablet (25 mg total) by mouth 2 (two) times daily. 180 tablet 1  . nitroGLYCERIN  (NITROSTAT) 0.4 MG SL tablet Place 1 tablet (0.4 mg total) under the tongue every 5 (five) minutes as needed for chest pain. 30 tablet 12  . pantoprazole (PROTONIX) 40 MG tablet Take 1 tablet (40 mg total) by mouth 2 (two) times daily. 60 tablet 3  . valsartan (DIOVAN) 160 MG tablet Take 1 tablet (160 mg total) by mouth daily. 90 tablet 1  . pravastatin (PRAVACHOL) 40 MG tablet Take 1 tablet (40 mg total) by mouth every evening.    . [DISCONTINUED] potassium chloride (K-DUR,KLOR-CON) 10 MEQ tablet Take 1 tablet (10 mEq total) by mouth daily. 30 tablet 8   No current facility-administered medications for this visit.   BP 132/82 mmHg  Pulse 58  Ht 5\' 7"  (1.702 m)  Wt 130 lb 6.4 oz (59.149 kg)  BMI 20.42 kg/m2 General: NAD, elderly Neck: No JVD, no thyromegaly or thyroid nodule.  Lungs: Clear to auscultation bilaterally with normal respiratory effort. CV: Nondisplaced PMI.  Heart regular S1/S2, no S3/S4,2/6 early SEM RUSB.  1+ ankle edema.  Left carotid bruit.  Unable to palpate pedal pulses but no foot ulcerations.  Abdomen: Soft, nontender, no hepatosplenomegaly, no distention.  Neurologic: Alert and oriented x 3.  Psych: Normal affect. Extremities: No clubbing or cyanosis.   Assessment/Plan:  CAD  Stable s/p NSTEMI. At last appointment, she described chest pain that sounded like GERD.  This has resolved with Protonix. . - Continue ASA 81, statin, valsartan, metoprolol.   Hyperlipidemia  Myalgias with atorvastatin.  She started pravastatin but then stopped it due to swelling.  I do not think this was caused by pravastatin.  She will restart pravastatin 40 mg daily with lipids/LFTs in 2 months.  Palpitations 10/15 holter showed occasional PACs and blocked PACs.  Hyponatremia Drinking less fluid, improved.   Loralie Champagne 09/09/2014

## 2014-09-28 ENCOUNTER — Encounter: Payer: Self-pay | Admitting: Family Medicine

## 2014-09-28 ENCOUNTER — Ambulatory Visit (INDEPENDENT_AMBULATORY_CARE_PROVIDER_SITE_OTHER): Payer: Medicare Other | Admitting: Family Medicine

## 2014-09-28 VITALS — BP 150/78 | HR 70 | Temp 97.7°F | Wt 134.0 lb

## 2014-09-28 DIAGNOSIS — K219 Gastro-esophageal reflux disease without esophagitis: Secondary | ICD-10-CM

## 2014-09-28 DIAGNOSIS — F411 Generalized anxiety disorder: Secondary | ICD-10-CM

## 2014-09-28 DIAGNOSIS — I10 Essential (primary) hypertension: Secondary | ICD-10-CM

## 2014-09-28 DIAGNOSIS — E785 Hyperlipidemia, unspecified: Secondary | ICD-10-CM

## 2014-09-28 MED ORDER — PANTOPRAZOLE SODIUM 40 MG PO TBEC: 40.0000 mg | DELAYED_RELEASE_TABLET | Freq: Every day | ORAL | Status: DC

## 2014-09-28 NOTE — Assessment & Plan Note (Signed)
Mild poor control on Valsartan 160mg , metoprolol 25 BID. Ideally with CAD history would want SBP <140 which it was equal to or less than on last 2 visits. Given previous control, we decided to simply monitor for now on current medications especially with occasional orthostatic symptoms- falls likely as a great a risk to patient as mildly elevated blood pressure.

## 2014-09-28 NOTE — Progress Notes (Signed)
Garret Reddish, MD Phone: [phone]  Subjective:   Becky Morales is a 80 y.o. year old very pleasant female patient who presents with the following:  Hypertension-mild poor control  BP Readings from Last 3 Encounters:  09/28/14 150/78  09/08/14 132/82  08/24/14 140/60  Home BP monitoring-no Compliant with medications-yes without side effects, diovan 160mg , metoprolol 25mg  BID ROS-Denies any CP, HA, SOB, blurry vision. occasional lightheadedness with standing  Hyperlipidemia-good control previously Patient states that pravastatin causes just as much leg aching as atorvastatin. As a results she takes it about 50% of the time and sometimes mixes in atorvastatin. She has blood work planned in the next few weeks through Cardiology follow-up. This is very different from the report at last visit when she said her leg aches had completely resolved on the pravastatin. ROS- no chest pain or shortness of breath. Myalgias in legs reported.   Anxiety- controlled Calls friends and talks 4-5 hours as needed. This has been very helpful.  ROS- no SI/HI  GERD- controlled Chest pain at night continues to remain resolved despite changing to 1 protonix per day ROS- no chest pain or shortness of breath   Past Medical History- Patient Active Problem List   Diagnosis Date Noted  . DNR/DNI 07/21/14 07/21/2014    Priority: High  . CAD (coronary artery disease)  s/p  PCI for NSTEMI 09/19/2011    Priority: High  . Anxiety state 08/24/2014    Priority: Medium  . Hyperlipidemia 10/31/2011    Priority: Medium  . Spinal stenosis of lumbar region at multiple levels 08/29/2011    Priority: Medium  . Malignant carcinoid tumor of unknown primary site 04/13/2007    Priority: Medium  . Essential hypertension 04/13/2007    Priority: Medium  . HYPERGLYCEMIA 04/13/2007    Priority: Medium  . Palpitations 08/04/2014    Priority: Low  . Dermatophytosis of nail 07/12/2013    Priority: Low  . CHOLELITHIASIS  05/02/2010    Priority: Low  . ALLERGIC RHINITIS 04/13/2007    Priority: Low  . GERD 04/13/2007    Priority: Low   Medications- reviewed and updated Current Outpatient Prescriptions  Medication Sig Dispense Refill  . aspirin 81 MG tablet Take 81 mg by mouth daily.     . IRON PO Take 1 tablet by mouth daily.    . metoprolol tartrate (LOPRESSOR) 25 MG tablet Take 1 tablet (25 mg total) by mouth 2 (two) times daily. 180 tablet 1  . pantoprazole (PROTONIX) 40 MG tablet Take 1 tablet (40 mg total) by mouth daily. 30 tablet 5  . pravastatin (PRAVACHOL) 40 MG tablet Take 1 tablet (40 mg total) by mouth every evening.    . valsartan (DIOVAN) 160 MG tablet Take 1 tablet (160 mg total) by mouth daily. 90 tablet 1  . nitroGLYCERIN (NITROSTAT) 0.4 MG SL tablet Place 1 tablet (0.4 mg total) under the tongue every 5 (five) minutes as needed for chest pain. (Patient not taking: Reported on 09/28/2014) 30 tablet 12  . [DISCONTINUED] potassium chloride (K-DUR,KLOR-CON) 10 MEQ tablet Take 1 tablet (10 mEq total) by mouth daily. 30 tablet 8   No current facility-administered medications for this visit.    Objective: BP 150/78 mmHg  Pulse 70  Temp(Src) 97.7 F (36.5 C)  Wt 134 lb (60.782 kg) Gen: NAD, resting comfortably, appears younger than stated age CV: RRR no murmurs rubs or gallops Lungs: CTAB no crackles, wheeze, rhonchi No epigastric tenderness Ext: 1+ edema Skin: warm, dry, no rash  Neuro: grossly normal, moves all extremities   Assessment/Plan:  Essential hypertension Mild poor control on Valsartan 160mg , metoprolol 25 BID. Ideally with CAD history would want SBP <140 which it was equal to or less than on last 2 visits. Given previous control, we decided to simply monitor for now on current medications especially with occasional orthostatic symptoms- falls likely as a great a risk to patient as mildly elevated blood pressure.    Hyperlipidemia Has planned lab follow up within next few  weeks. Given pattern of taking pravastatin perhaps 50% of tim and intermixing atorvastatin-would be difficult to interpret labs. I am going to make Dr. Aundra Dubin aware. I am going to continue to encourage patient to take statin (stick with pravastatin) at this point daily as not clear that the aches are truly related .  Anxiety state Much improved with talk therapy with friends. Continue to avoid benzos given age.   GERD Good control on once a day protonix down from BID. Continue current dose- new rx sent in.   follow up 3 months  Meds ordered this encounter  Medications  . pantoprazole (PROTONIX) 40 MG tablet    Sig: Take 1 tablet (40 mg total) by mouth daily.    Dispense:  30 tablet    Refill:  5    Decrease from BID

## 2014-09-28 NOTE — Assessment & Plan Note (Addendum)
Has planned lab follow up within next few weeks. Given pattern of taking pravastatin perhaps 50% of tim and intermixing atorvastatin-would be difficult to interpret labs. I am going to make Dr. Aundra Dubin aware. I am going to continue to encourage patient to take statin (stick with pravastatin) at this point daily as not clear that the aches are truly related .

## 2014-09-28 NOTE — Assessment & Plan Note (Signed)
Much improved with talk therapy with friends. Continue to avoid benzos given age.

## 2014-09-28 NOTE — Patient Instructions (Addendum)
Great to see you today!   Glad you are doing so well.   We will hold off on the flu shot given how far it set you back last year.   Consider pneumonia shot in summer when there aren't so many bugs going around.   Take your pravastatin daily until you get your labs.   Glad your worry/anxiety is doing better. Still have Richardo Priest available if you need her (a counselor at our office)  Don't think we have room to go up on blood pressure medicine given you get lightheaded at times.   See me in 3 months unless you need me sooner.

## 2014-09-28 NOTE — Assessment & Plan Note (Signed)
Good control on once a day protonix down from BID. Continue current dose- new rx sent in.

## 2014-09-29 ENCOUNTER — Encounter (HOSPITAL_COMMUNITY): Payer: Self-pay | Admitting: Cardiology

## 2014-10-05 ENCOUNTER — Other Ambulatory Visit (INDEPENDENT_AMBULATORY_CARE_PROVIDER_SITE_OTHER): Payer: Medicare Other | Admitting: *Deleted

## 2014-10-05 ENCOUNTER — Ambulatory Visit: Payer: Medicare Other | Admitting: Family Medicine

## 2014-10-05 DIAGNOSIS — E785 Hyperlipidemia, unspecified: Secondary | ICD-10-CM

## 2014-10-05 DIAGNOSIS — I2510 Atherosclerotic heart disease of native coronary artery without angina pectoris: Secondary | ICD-10-CM

## 2014-10-05 LAB — LIPID PANEL
Cholesterol: 138 mg/dL (ref 0–200)
HDL: 59.4 mg/dL (ref >39.00)
LDL CALC: 64 mg/dL (ref 0–99)
NonHDL: 78.6
TRIGLYCERIDES: 72 mg/dL (ref 0.0–149.0)
Total CHOL/HDL Ratio: 2
VLDL: 14.4 mg/dL (ref 0.0–40.0)

## 2014-10-05 LAB — HEPATIC FUNCTION PANEL
ALT: 50 U/L — ABNORMAL HIGH (ref 0–35)
AST: 47 U/L — ABNORMAL HIGH (ref 0–37)
Albumin: 3.8 g/dL (ref 3.5–5.2)
Alkaline Phosphatase: 121 U/L — ABNORMAL HIGH (ref 39–117)
BILIRUBIN DIRECT: 0 mg/dL (ref 0.0–0.3)
BILIRUBIN TOTAL: 0.8 mg/dL (ref 0.2–1.2)
TOTAL PROTEIN: 7.7 g/dL (ref 6.0–8.3)

## 2014-10-11 ENCOUNTER — Other Ambulatory Visit: Payer: Self-pay | Admitting: *Deleted

## 2014-10-11 DIAGNOSIS — R748 Abnormal levels of other serum enzymes: Secondary | ICD-10-CM

## 2014-10-11 DIAGNOSIS — E785 Hyperlipidemia, unspecified: Secondary | ICD-10-CM

## 2014-10-26 ENCOUNTER — Other Ambulatory Visit (INDEPENDENT_AMBULATORY_CARE_PROVIDER_SITE_OTHER): Payer: Medicare Other | Admitting: *Deleted

## 2014-10-26 DIAGNOSIS — R748 Abnormal levels of other serum enzymes: Secondary | ICD-10-CM | POA: Diagnosis not present

## 2014-10-26 DIAGNOSIS — E785 Hyperlipidemia, unspecified: Secondary | ICD-10-CM

## 2014-10-26 LAB — HEPATIC FUNCTION PANEL
ALT: 59 U/L — AB (ref 0–35)
AST: 48 U/L — ABNORMAL HIGH (ref 0–37)
Albumin: 3.7 g/dL (ref 3.5–5.2)
Alkaline Phosphatase: 114 U/L (ref 39–117)
BILIRUBIN DIRECT: 0 mg/dL (ref 0.0–0.3)
BILIRUBIN TOTAL: 1 mg/dL (ref 0.2–1.2)
Total Protein: 7.5 g/dL (ref 6.0–8.3)

## 2014-10-31 ENCOUNTER — Encounter: Payer: Self-pay | Admitting: Podiatry

## 2014-10-31 ENCOUNTER — Ambulatory Visit (INDEPENDENT_AMBULATORY_CARE_PROVIDER_SITE_OTHER): Payer: 59 | Admitting: Podiatry

## 2014-10-31 DIAGNOSIS — L84 Corns and callosities: Secondary | ICD-10-CM

## 2014-10-31 DIAGNOSIS — M79673 Pain in unspecified foot: Secondary | ICD-10-CM

## 2014-10-31 DIAGNOSIS — B351 Tinea unguium: Secondary | ICD-10-CM

## 2014-11-02 NOTE — Progress Notes (Signed)
Subjective:     Patient ID: Becky Morales, female   DOB: 03/21/1920, 81 y.o.   MRN: [id number]  HPI patient presents with painful nails 1-5 of both feet and lesions on the bottom of both feet that are painful and making it hard for her to walk   Review of Systems     Objective:   Physical Exam Neurovascular status intact with thick yellow brittle nailbeds 1-5 both feet that are painful and lesions plantar aspect of both feet that are painful when pressed    Assessment:     Mycotic nail infection 1 through 5 both feet and lesion formation both feet    Plan:     Debrided painful nailbeds 1-5 both feet and debrided lesions on both feet with no iatrogenic bleeding noted

## 2014-11-03 ENCOUNTER — Other Ambulatory Visit: Payer: Self-pay | Admitting: *Deleted

## 2014-11-03 DIAGNOSIS — E785 Hyperlipidemia, unspecified: Secondary | ICD-10-CM

## 2014-11-03 DIAGNOSIS — R748 Abnormal levels of other serum enzymes: Secondary | ICD-10-CM

## 2014-11-10 ENCOUNTER — Other Ambulatory Visit (INDEPENDENT_AMBULATORY_CARE_PROVIDER_SITE_OTHER): Payer: Medicare Other | Admitting: *Deleted

## 2014-11-10 DIAGNOSIS — E785 Hyperlipidemia, unspecified: Secondary | ICD-10-CM | POA: Diagnosis not present

## 2014-11-10 DIAGNOSIS — R748 Abnormal levels of other serum enzymes: Secondary | ICD-10-CM

## 2014-11-10 LAB — HEPATIC FUNCTION PANEL
ALBUMIN: 3.6 g/dL (ref 3.5–5.2)
ALT: 35 U/L (ref 0–35)
AST: 30 U/L (ref 0–37)
Alkaline Phosphatase: 113 U/L (ref 39–117)
Bilirubin, Direct: 0.2 mg/dL (ref 0.0–0.3)
TOTAL PROTEIN: 7.2 g/dL (ref 6.0–8.3)
Total Bilirubin: 0.8 mg/dL (ref 0.2–1.2)

## 2014-11-24 ENCOUNTER — Other Ambulatory Visit: Payer: Self-pay | Admitting: Internal Medicine

## 2014-11-24 MED ORDER — NITROGLYCERIN 0.4 MG SL SUBL: 0.4000 mg | SUBLINGUAL_TABLET | SUBLINGUAL | Status: DC | PRN

## 2014-11-24 NOTE — Addendum Note (Signed)
Addended by: Clyde Lundborg A on: 11/24/2014 01:46 PM   Modules accepted: Orders

## 2014-12-27 ENCOUNTER — Ambulatory Visit (INDEPENDENT_AMBULATORY_CARE_PROVIDER_SITE_OTHER): Payer: Medicare Other | Admitting: Family Medicine

## 2014-12-27 ENCOUNTER — Encounter: Payer: Self-pay | Admitting: Family Medicine

## 2014-12-27 VITALS — BP 140/78 | HR 67 | Temp 97.7°F | Wt 130.0 lb

## 2014-12-27 DIAGNOSIS — I2510 Atherosclerotic heart disease of native coronary artery without angina pectoris: Secondary | ICD-10-CM

## 2014-12-27 DIAGNOSIS — E785 Hyperlipidemia, unspecified: Secondary | ICD-10-CM

## 2014-12-27 DIAGNOSIS — Z23 Encounter for immunization: Secondary | ICD-10-CM

## 2014-12-27 DIAGNOSIS — I10 Essential (primary) hypertension: Secondary | ICD-10-CM

## 2014-12-27 NOTE — Assessment & Plan Note (Signed)
Asymptomatic and continues to tolerate aspirin. Needs to be on statin to lower cardiac risk (we also discussed other option of being off and increased cardiac risk but may be reasonable given age and dnr/dni status but declines).

## 2014-12-27 NOTE — Progress Notes (Signed)
Garret Reddish, MD Phone: [phone]  Subjective:   Becky Morales is a 81 y.o. year old very pleasant female patient who presents with the following:  CAD- asymptomatic Hyperlipidemia- suspect poor control Patient is compliant with her aspirin. She used to be on atorvastatin but developed myalgias so was changed to pravastatin but hasNausea on pravastatin. She has stopped taking all statins.  ROS-denies chest pain/shortness of breath/left arm or neck pain  Hypertension-mild poor control  BP Readings from Last 3 Encounters:  12/27/14 140/78  09/28/14 150/78  09/08/14 132/82  Home BP monitoring-no Compliant with medications-yes without side effects ROS-Denies any CP, HA, SOB, blurry vision, LE edema  Past Medical History- Patient Active Problem List   Diagnosis Date Noted  . DNR/DNI 07/21/14 07/21/2014    Priority: High  . CAD (coronary artery disease)  s/p  PCI for NSTEMI 09/19/2011    Priority: High  . Anxiety state 08/24/2014    Priority: Medium  . Hyperlipidemia 10/31/2011    Priority: Medium  . Spinal stenosis of lumbar region at multiple levels 08/29/2011    Priority: Medium  . Malignant carcinoid tumor of unknown primary site 04/13/2007    Priority: Medium  . Essential hypertension 04/13/2007    Priority: Medium  . HYPERGLYCEMIA 04/13/2007    Priority: Medium  . Palpitations 08/04/2014    Priority: Low  . Dermatophytosis of nail 07/12/2013    Priority: Low  . CHOLELITHIASIS 05/02/2010    Priority: Low  . ALLERGIC RHINITIS 04/13/2007    Priority: Low  . GERD 04/13/2007    Priority: Low   Medications- reviewed and updated Current Outpatient Prescriptions  Medication Sig Dispense Refill  . aspirin 81 MG tablet Take 81 mg by mouth daily.     . IRON PO Take 1 tablet by mouth daily.    . metoprolol tartrate (LOPRESSOR) 25 MG tablet Take 1 tablet (25 mg total) by mouth 2 (two) times daily. 180 tablet 1  . pantoprazole (PROTONIX) 40 MG tablet Take 1 tablet (40  mg total) by mouth daily. 30 tablet 5  . valsartan (DIOVAN) 160 MG tablet Take 1 tablet (160 mg total) by mouth daily. 90 tablet 1  . nitroGLYCERIN (NITROSTAT) 0.4 MG SL tablet Place 1 tablet (0.4 mg total) under the tongue every 5 (five) minutes as needed for chest pain. (Patient not taking: Reported on 12/27/2014) 25 tablet 12   Objective: BP 140/78 mmHg  Pulse 67  Temp(Src) 97.7 F (36.5 C)  Wt 130 lb (58.968 kg) Gen: NAD, resting comfortably, appears younger than stated age CV: RRR no murmurs rubs or gallops Lungs: CTAB no crackles, wheeze, rhonchi Abdomen: soft/nontender/nondistended/normal bowel sounds.  Ext: no edema but wearing compression stockings Skin: warm, dry, no rash   Assessment/Plan:  CAD (coronary artery disease)  s/p  PCI for NSTEMI Asymptomatic and continues to tolerate aspirin. Needs to be on statin to lower cardiac risk (we also discussed other option of being off and increased cardiac risk but may be reasonable given age and dnr/dni status but declines).    Hyperlipidemia Atorvastatin 80mg change to pravastatin by cardiology due to myalgias. Patient had nausea on pravastatin as well as LFT elevations. She stopped this on her own. She has never tried pulse therapy so we will trial once a week on Monday atorvastatin 80mg  and she will update me within 3 weeks and can send in longer term rx. As per CAD, other option would be to come off statin completely noting higher risk but balancing with  side effect difficulties.    Essential hypertension Very mild poor control on Valsartan 160mg , metoprolol 25 BID. Discussed with CAD history SBP goal <140. Discussed increasing valsartan but patient not interested. She is very concerned abotu SE of medication increase   follow up 3 months-recheck LDL, lfts at that time  Orders Placed This Encounter  Procedures  . Pneumococcal conjugate vaccine 13-valent

## 2014-12-27 NOTE — Assessment & Plan Note (Signed)
Very mild poor control on Valsartan 160mg , metoprolol 25 BID. Discussed with CAD history SBP goal <140. Discussed increasing valsartan but patient not interested. She is very concerned abotu SE of medication increase

## 2014-12-27 NOTE — Patient Instructions (Addendum)
Received last Pneumonia shot today (Prevnar13)  Blood pressure very slightly high You get dizzy with standing at times and you prefer not to take more medication so we held off on increasing blood pressure medicatoins  Cholesterol Try the atorvastatin (the one that caused cramp) but just take it every Monday. Call me in 3 weeks. If you are not having the cramps, we will continue with that. Sorry the pravastatin caused nausea (continue off this)

## 2014-12-27 NOTE — Assessment & Plan Note (Signed)
Atorvastatin 80mg change to pravastatin by cardiology due to myalgias. Patient had nausea on pravastatin as well as LFT elevations. She stopped this on her own. She has never tried pulse therapy so we will trial once a week on Monday atorvastatin 80mg  and she will update me within 3 weeks and can send in longer term rx. As per CAD, other option would be to come off statin completely noting higher risk but balancing with side effect difficulties.

## 2015-01-05 ENCOUNTER — Telehealth: Payer: Self-pay | Admitting: Family Medicine

## 2015-01-05 NOTE — Telephone Encounter (Signed)
Pt instructed to cb w/in 3 days. Pt states she is taking Lipitor 1 X /wk on mondays. Is that right?

## 2015-01-05 NOTE — Telephone Encounter (Signed)
Called pt back and lm

## 2015-01-30 ENCOUNTER — Ambulatory Visit (INDEPENDENT_AMBULATORY_CARE_PROVIDER_SITE_OTHER): Payer: Medicare Other | Admitting: Podiatry

## 2015-01-30 DIAGNOSIS — B351 Tinea unguium: Secondary | ICD-10-CM

## 2015-01-30 DIAGNOSIS — M79673 Pain in unspecified foot: Secondary | ICD-10-CM

## 2015-01-30 DIAGNOSIS — L84 Corns and callosities: Secondary | ICD-10-CM | POA: Diagnosis not present

## 2015-01-30 NOTE — Progress Notes (Signed)
Subjective:     Patient ID: Becky Morales, female   DOB: Feb 21, 1920, 81 y.o.   MRN: [id number]  HPI patient presents with painful nails 1-5 of both feet and lesions on the bottom of both feet that are painful and making it hard for her to walk   Review of Systems     Objective:   Physical Exam Neurovascular status intact with thick yellow brittle nailbeds 1-5 both feet that are painful and lesions plantar aspect of both feet that are painful when pressed    Assessment:     Mycotic nail infection 1 through 5 both feet and lesion formation both feet    Plan:     Debrided painful nailbeds 1-5 both feet and debrided lesions on both feet with no iatrogenic bleeding noted

## 2015-02-13 ENCOUNTER — Telehealth: Payer: Self-pay | Admitting: Family Medicine

## 2015-02-13 NOTE — Telephone Encounter (Signed)
Used a SDA for Friday 02/17/15 at 10:45

## 2015-02-13 NOTE — Telephone Encounter (Signed)
That's fine

## 2015-02-17 ENCOUNTER — Encounter: Payer: Self-pay | Admitting: Family Medicine

## 2015-02-17 ENCOUNTER — Encounter: Payer: Self-pay | Admitting: Physician Assistant

## 2015-02-17 ENCOUNTER — Ambulatory Visit (INDEPENDENT_AMBULATORY_CARE_PROVIDER_SITE_OTHER): Payer: Medicare Other | Admitting: Physician Assistant

## 2015-02-17 ENCOUNTER — Ambulatory Visit (INDEPENDENT_AMBULATORY_CARE_PROVIDER_SITE_OTHER): Payer: Medicare Other | Admitting: Family Medicine

## 2015-02-17 VITALS — BP 162/50 | HR 71 | Ht 67.5 in | Wt 133.2 lb

## 2015-02-17 VITALS — BP 130/78 | HR 77 | Temp 97.9°F | Resp 19 | Ht 67.0 in | Wt 135.0 lb

## 2015-02-17 DIAGNOSIS — R600 Localized edema: Secondary | ICD-10-CM

## 2015-02-17 DIAGNOSIS — R079 Chest pain, unspecified: Secondary | ICD-10-CM

## 2015-02-17 DIAGNOSIS — R5383 Other fatigue: Secondary | ICD-10-CM

## 2015-02-17 LAB — CBC WITH DIFFERENTIAL/PLATELET
Basophils Absolute: 0 10*3/uL (ref 0.0–0.1)
Basophils Relative: 0.5 % (ref 0.0–3.0)
EOS PCT: 1 % (ref 0.0–5.0)
Eosinophils Absolute: 0.1 10*3/uL (ref 0.0–0.7)
HCT: 49.9 % — ABNORMAL HIGH (ref 36.0–46.0)
Hemoglobin: 16.8 g/dL — ABNORMAL HIGH (ref 12.0–15.0)
LYMPHS PCT: 14.9 % (ref 12.0–46.0)
Lymphs Abs: 1 10*3/uL (ref 0.7–4.0)
MCHC: 33.7 g/dL (ref 30.0–36.0)
MCV: 94.6 fl (ref 78.0–100.0)
MONOS PCT: 8.1 % (ref 3.0–12.0)
Monocytes Absolute: 0.5 10*3/uL (ref 0.1–1.0)
Neutro Abs: 5.1 10*3/uL (ref 1.4–7.7)
Neutrophils Relative %: 75.5 % (ref 43.0–77.0)
PLATELETS: 458 10*3/uL — AB (ref 150.0–400.0)
RBC: 5.28 Mil/uL — AB (ref 3.87–5.11)
RDW: 17.5 % — ABNORMAL HIGH (ref 11.5–15.5)
WBC: 6.7 10*3/uL (ref 4.0–10.5)

## 2015-02-17 LAB — COMPREHENSIVE METABOLIC PANEL
ALBUMIN: 3.7 g/dL (ref 3.5–5.2)
ALT: 40 U/L — ABNORMAL HIGH (ref 0–35)
AST: 38 U/L — ABNORMAL HIGH (ref 0–37)
Alkaline Phosphatase: 134 U/L — ABNORMAL HIGH (ref 39–117)
BUN: 21 mg/dL (ref 6–23)
CALCIUM: 9.9 mg/dL (ref 8.4–10.5)
CHLORIDE: 95 meq/L — AB (ref 96–112)
CO2: 29 meq/L (ref 19–32)
Creatinine, Ser: 1.06 mg/dL (ref 0.40–1.20)
GFR: 62.19 mL/min (ref >60.00)
Glucose, Bld: 104 mg/dL — ABNORMAL HIGH (ref 70–99)
POTASSIUM: 4.1 meq/L (ref 3.5–5.1)
SODIUM: 130 meq/L — AB (ref 135–145)
Total Bilirubin: 1 mg/dL (ref 0.2–1.2)
Total Protein: 7.6 g/dL (ref 6.0–8.3)

## 2015-02-17 LAB — BRAIN NATRIURETIC PEPTIDE: Brain Natriuretic Peptide: 107.8 pg/mL — ABNORMAL HIGH (ref 0.0–100.0)

## 2015-02-17 LAB — TROPONIN I: TNIDX: 0.11 ug/l — ABNORMAL HIGH (ref 0.00–0.06)

## 2015-02-17 LAB — TSH: TSH: 5.33 u[IU]/mL — AB (ref 0.35–4.50)

## 2015-02-17 NOTE — Progress Notes (Signed)
Cardiology Office Note   Date:  02/17/2015   ID:  Becky Morales, DOB 08-Dec-1919, MRN [id number]  PCP:  Garret Reddish, MD  Cardiologist:  Dr. Aundra Dubin   CC: Chest pain    History of Present Illness: Becky Morales is a 81 y.o. female with a history for CAD: NSTEMI 08/2011 s/p BMS to OM1, HTN, HLD, GERD, and carcinoid who was sent from here PCP's office today for evaluation of chest pain and fatigue/weakness.  She was seen by Dr Yong Channel today for evaluation of chest pain with exertion ( typically while walking around house) and relieved with rest as well as fatigue and weakness for about a week. She reported that she felt like she was going pass out on several occasions just walking around the house. She denies associated SOB, nausea, or diaphoresis.  Symptoms resolve with sitting down. She has taken nitroglycerin 3x in the last week. Left sided chest pain resolves within a few minutes of nitroglycerin. A month ago was not taking nitroglycerin at all. She does admit to some new LE edema. She reports that she sleeps on 4 pillows at night, but not clearly because of orthopnea (she likes to watch TV and falls asleep that way). Denies PND. She denies CP currently.   Dr. Yong Channel ordered a CBC with diff, CMET, TSH and troponin and appropriately sent her our cardiology office for further work up.    PMH: 1. L-spine stenosis: Patient does not want surgery.  2. CAD: NSTEMI 11/12. She had BMS to OM1. There was also 80% ostial RCA that was not treated. EF 55% on LV-gram.  3. HTN 4. Carcinoid 2008 5. GERD 6. Echo (11/12): EF 55-60%, mild AI, mild MR. Echo (4/14) with EF 60-65%, mild AI and MR.  7. Hyperlipidemia: Myalgias with atorvastatin.  8. Palpitations: Holter (10/15) with occasional PACs and blocked PACs.      Past Medical History  Diagnosis Date  . CARCINOID TUMOR 04/13/2007  . HYPERTENSION 04/13/2007  . ALLERGIC RHINITIS 04/13/2007  . GERD 04/13/2007  . HYPERGLYCEMIA 04/13/2007  .  CHOLELITHIASIS 05/02/2010  . Arthritis   . Cancer   . Environmental allergies     has mucus in throat  . Diphtheria     age 89  . CAD (coronary artery disease)     NSTEMI.  LHC 09/03/11: pLAD 40-50%, pDx 80-90% (very small), lateral branch of OM 99%, m-dCFX 40%, oRCA 80%, m-dRCA 50-70%, EF 55%.  She had PCI with BMS to lateral branch of the OM.  RCA was tx medically.   . Hyponatremia   . Murmur     echo 11/12: EF 55-60%, mod calcified AV annulus, mild AI, mild MR  . Dermatophytosis of nail     Past Surgical History  Procedure Laterality Date  . Tonsillectomy      many years ago  . Cardiac surgery    . Colon surgery      CANCER  . Small bowel repair    . Left heart catheterization with coronary angiogram N/A 09/03/2011    Procedure: LEFT HEART CATHETERIZATION WITH CORONARY ANGIOGRAM;  Surgeon: Peter M Martinique, MD;  Location: [facility] CATH LAB;  Service: Cardiovascular;  Laterality: N/A;  . Percutaneous coronary stent intervention (pci-s)  09/03/2011    Procedure: PERCUTANEOUS CORONARY STENT INTERVENTION (PCI-S);  Surgeon: Peter M Martinique, MD;  Location: [facility] CATH LAB;  Service: Cardiovascular;;     Current Outpatient Prescriptions  Medication Sig Dispense Refill  . aspirin 81 MG tablet  Take 81 mg by mouth daily.     . calcium-vitamin D (OSCAL WITH D) 500-200 MG-UNIT per tablet Take 1 tablet by mouth 2 (two) times daily.    . IRON PO Take 1 tablet by mouth daily.    . metoprolol tartrate (LOPRESSOR) 25 MG tablet Take 1 tablet (25 mg total) by mouth 2 (two) times daily. 180 tablet 1  . Multiple Vitamin (MULTI VITAMIN DAILY) TABS Take 1 tablet by mouth daily. Pt. Not sure of dose.    . nitroGLYCERIN (NITROSTAT) 0.4 MG SL tablet Place 1 tablet (0.4 mg total) under the tongue every 5 (five) minutes as needed for chest pain. 25 tablet 12  . pantoprazole (PROTONIX) 40 MG tablet Take 1 tablet (40 mg total) by mouth daily. 30 tablet 5  . valsartan (DIOVAN) 160 MG tablet Take 1 tablet (160 mg total)  by mouth daily. 90 tablet 1  . [DISCONTINUED] potassium chloride (K-DUR,KLOR-CON) 10 MEQ tablet Take 1 tablet (10 mEq total) by mouth daily. 30 tablet 8   No current facility-administered medications for this visit.    Allergies:   Ciprofloxacin and Paxil    Social History:  The patient  reports that she has never smoked. She has never used smokeless tobacco. She reports that she does not drink alcohol or use illicit drugs.   Family History:  The patient's family history includes Heart attack in her brother; Hyperlipidemia in her brother; Hypertension in her brother, father, and mother; Lung cancer in her sister; Ovarian cancer in her mother; Stroke in her father; Uterine cancer in her mother.    ROS:  Please see the history of present illness.   Otherwise, review of systems are positive for none.   All other systems are reviewed and negative.    PHYSICAL EXAM: VS:  Ht 5' 7.5" (1.715 m)  Wt 133 lb 3.2 oz (60.419 kg)  BMI 20.54 kg/m2 , BMI Body mass index is 20.54 kg/(m^2). GEN: Well nourished, well developed, in no acute distress. Elderly and frail appearing HEENT: normal Neck: no JVD, carotid bruits, or masses Cardiac: RRR; no murmurs, rubs, or gallops, 2 + LE edema bilaterally Respiratory:  Mild crackles at bases, normal work of breathing GI: soft, nontender, nondistended, + BS MS: no deformity or atrophy Skin: warm and dry, no rash Neuro:  Strength and sensation are intact Psych: euthymic mood, full affect   EKG:  EKG is ordered today. The ekg ordered today demonstrates NSR HR 71, biatrial enlargement   Recent Labs: 08/03/2014: BUN 21; Creatinine 1.2; Hemoglobin 14.7; Platelets 448.0*; Potassium 4.8; Pro B Natriuretic peptide (BNP) 181.0*; Sodium 135 08/11/2014: TSH 6.03* 11/10/2014: ALT 35    Lipid Panel    Component Value Date/Time   CHOL 138 10/05/2014 0836   TRIG 72.0 10/05/2014 0836   HDL 59.40 10/05/2014 0836   CHOLHDL 2 10/05/2014 0836   VLDL 14.4 10/05/2014  0836   LDLCALC 64 10/05/2014 0836   LDLDIRECT 141.6 12/02/2008 1215      Wt Readings from Last 3 Encounters:  02/17/15 133 lb 3.2 oz (60.419 kg)  02/17/15 135 lb (61.236 kg)  12/27/14 130 lb (58.968 kg)     ASSESSMENT AND PLAN:  Becky Morales is a 81 y.o. female with a history for CAD: NSTEMI 08/2011 s/p BMS to OM1, HTN, HLD, GERD, and carcinoid who was sent from here PCP's office today for evaluation of chest pain and fatigue/weakness. Dr. Yong Channel ordered a CBC with diff, CMET, TSH and troponin and appropriately  sent her our cardiology office for further work up.   CAD/ Typical chest pain - ECG with no acute ST or TW changes. No current chest pain.   While in the room with patient, we received a call from her PCP, Dr Yong Channel, who reported that her POC troponin was elevated at 0.11. We advised for her to go to the hospital for admission and further work up but she refused. We then had a long talk about her goals of care. She is 81 years old and feels like "she would not be upset if she did not live until 81." She feels that she has lived a good life and is ready to go if "god is ready to take her." I spoke with Dr. Yong Channel about her decision and we both agree and respect her decision. Dr Yong Channel offered to see her in his office next week to have a formal discussion about goals of care and possible hospice. It is not unreasonable to take her off all her life prolonging medications ( ie statin which cause her myalgias) and use medications for palliative management only ( NTG and possibly lasix if she is volume overloaded.) The patient was very happy and seemed very peaceful about this decision. She is very fond of Dr. Yong Channel and Dr. Aundra Dubin and appreciates their time and care. I told her that she would no longer need to follow with cardiology if she decides to go the palliative route. She was sad she would no longer get to see Dr. Aundra Dubin but understood. I told her we are happy to see her if she decides to  change her mind and wants to proceed with life prolonging medicine. I will defer the rest of the her care to Dr. Yong Channel who she will see next week.   LE edema and mild crackles at lung bases- will get a BNP today to see if she has volume overload. She has new 2+ pitting edema bilaterally and mild crackles at her lung bases. She denies significant SOB but feels very fatigued. We can treat her with lasix symptomatically if she is indeed volume overloaded. She does have chronic hyponatremia, which may have to be followed with diuresis if she decided against palliative care.   Current medicines are reviewed at length with the patient today.  The patient does not have concerns regarding medicines.  The following changes have been made:  no change  Labs/ tests ordered today include: No orders of the defined types were placed in this encounter.     Disposition:   FU with Dr Yong Channel next week for goals of care conversation.   Renea Ee  02/17/2015 2:53 PM    [facility] [address], [place], [place]  [address] Phone: [phone]; Fax: [phone]

## 2015-02-17 NOTE — Progress Notes (Signed)
Pre visit review using our clinic review tool, if applicable. No additional management support is needed unless otherwise documented below in the visit note. 

## 2015-02-17 NOTE — Progress Notes (Signed)
Garret Reddish, MD  Subjective:   Becky Morales is a 81 y.o. year old very pleasant female patient who presents with:  Fatigue/weakness Chest Pain All typically while walking around house and relieved with rest -fatigue and weakness for about a week. Felt like she was going pass out on several occasions just walking around the house. Does not feel fatigued or weak while resting. Symptoms resolve with sitting down. She does not always have chest pain with the tiredness but has had a few episodes as below   Has taken nitroglycerin 3x in the last week. Leftsided chest pain that resolves within a few minutes of nitroglycerin. A month ago was not taking nitroglycerin.  Does have increased cough but has some allergies. Not short of breath. Not sweating with this. No nausea with this.   ROS- denies fever/chills. Does have slight runny nose and cough but both mild. No shortness of breath.   Past Medical History- CAD with history NSTEMI and PCI. HTN, HLD.  DNR/DNI.   Medications- reviewed and updated Current Outpatient Prescriptions  Medication Sig Dispense Refill  . aspirin 81 MG tablet Take 81 mg by mouth daily.     . IRON PO Take 1 tablet by mouth daily.    . metoprolol tartrate (LOPRESSOR) 25 MG tablet Take 1 tablet (25 mg total) by mouth 2 (two) times daily. 180 tablet 1  . nitroGLYCERIN (NITROSTAT) 0.4 MG SL tablet Place 1 tablet (0.4 mg total) under the tongue every 5 (five) minutes as needed for chest pain. (Patient not taking: Reported on 12/27/2014) 25 tablet 12  . pantoprazole (PROTONIX) 40 MG tablet Take 1 tablet (40 mg total) by mouth daily. 30 tablet 5  . valsartan (DIOVAN) 160 MG tablet Take 1 tablet (160 mg total) by mouth daily. 90 tablet 1  . [DISCONTINUED] potassium chloride (K-DUR,KLOR-CON) 10 MEQ tablet Take 1 tablet (10 mEq total) by mouth daily. 30 tablet 8  Atorvastatin once a week  Objective: BP 130/78 mmHg  Pulse 77  Temp(Src) 97.9 F (36.6 C) (Oral)  Resp 19  Ht 5'  7" (1.702 m)  Wt 135 lb (61.236 kg)  BMI 21.14 kg/m2  SpO2 97% Gen: NAD, resting comfortably in chair, moves much more slowly than usual and needs almost full assist to get on table. No lightheadedness with standing. Seems to pause and rest while walkign down short hall.  CV: RRR no murmurs rubs or gallops, No JVD No pain with palpation of chest wall Lungs: CTAB no crackles, wheeze, rhonchi Abdomen: soft/nontender/nondistended/normal bowel sounds.  Ext: 1+ edema (not wearing compression stockings as last visit though) Skin: warm, dry, no rash over chest  Orthostatics negative  EKG: NSR rate 67, normal axis, normal intervals, p amlitude at II at 2.5 may suggest R atrial hypertrophyq wave in v1, v2, v1, v2 also demonstrate increased st elevation compared to previous but did have elevation on prior evaluations.   Assessment/Plan:  Fatigue/weakness Chest Pain All typically while walking around house and relieved with rest Check stat labs including CBC, CMET, troponin. Also check TSH Does have slight allergy symptoms but doubt would cause chest pain and this level of fatigue 3x used nitroglycerin in last week when has not in months concerning for unstable angina  EKG with some changes in v1, v2 though not drastic and already cardiac cause of fatigue and chest pain was high on differential. Got patient in with cardiology team today at 2:45 PM.  Very difficult case given age and DNR/DNI and  patient not even sure if she would want to have a stent. She is going to further discuss with cardiology today her wishes. Patient told me as she was living she has lived a long life and wouldn't mind if there was not a tomorrow, though she does not wish for this. We even briefly discussed removing medications that would prevent her end like BP, cholesterol medications and she stated she would consider this as well.   Strict Return precautions advised for reasons to go immediately to ED  Orders Placed This  Encounter  Procedures  . CBC with Differential/Platelet  . Comprehensive metabolic panel    [place]  . TSH    [place]  . Troponin I  . EKG 12-Lead

## 2015-02-17 NOTE — Patient Instructions (Addendum)
I am going to check some labs to assess your fatigue but I am concerned about your heart.   We got an appointment with cardiology for you today.   Please stop at labs, I am ordering some stat labs to hopefully help the cardiologist and [place] out in making decisions for you.

## 2015-02-17 NOTE — Patient Instructions (Addendum)
.  Medication Instructions:  Your physician recommends that you continue on your current medications as directed. Please refer to the Current Medication list given to you today.   Labwork: 1. TODAY BNP  Testing/Procedures: NONE   Follow-Up:    Any Other Special Instructions Will Be Listed Below (If Applicable).

## 2015-02-21 ENCOUNTER — Telehealth: Payer: Self-pay

## 2015-02-21 ENCOUNTER — Ambulatory Visit: Payer: Medicare Other | Admitting: Nurse Practitioner

## 2015-02-21 NOTE — Telephone Encounter (Signed)
Becky Morales can you please get pt scheduled for a f/u visit sometime this week to follow up on chest pain per Dr. Yong Channel.

## 2015-02-23 NOTE — Telephone Encounter (Signed)
Pt scheduled for 02/24/15 .She said she will  come in if she can find someone to bring her

## 2015-02-23 NOTE — Telephone Encounter (Signed)
Noted  

## 2015-02-24 ENCOUNTER — Encounter: Payer: Self-pay | Admitting: Family Medicine

## 2015-02-24 ENCOUNTER — Telehealth: Payer: Self-pay | Admitting: Family Medicine

## 2015-02-24 ENCOUNTER — Ambulatory Visit (INDEPENDENT_AMBULATORY_CARE_PROVIDER_SITE_OTHER): Payer: Medicare Other | Admitting: Family Medicine

## 2015-02-24 VITALS — BP 158/82 | HR 82 | Temp 97.9°F | Wt 132.0 lb

## 2015-02-24 DIAGNOSIS — Z7189 Other specified counseling: Secondary | ICD-10-CM

## 2015-02-24 DIAGNOSIS — I2510 Atherosclerotic heart disease of native coronary artery without angina pectoris: Secondary | ICD-10-CM | POA: Diagnosis not present

## 2015-02-24 DIAGNOSIS — R778 Other specified abnormalities of plasma proteins: Secondary | ICD-10-CM

## 2015-02-24 DIAGNOSIS — R7989 Other specified abnormal findings of blood chemistry: Secondary | ICD-10-CM

## 2015-02-24 DIAGNOSIS — E785 Hyperlipidemia, unspecified: Secondary | ICD-10-CM

## 2015-02-24 DIAGNOSIS — I10 Essential (primary) hypertension: Secondary | ICD-10-CM

## 2015-02-24 MED ORDER — VALSARTAN 320 MG PO TABS: 320.0000 mg | ORAL_TABLET | Freq: Every day | ORAL | Status: DC

## 2015-02-24 MED ORDER — METOPROLOL TARTRATE 25 MG PO TABS: 25.0000 mg | ORAL_TABLET | Freq: Two times a day (BID) | ORAL | Status: DC

## 2015-02-24 MED ORDER — NITROGLYCERIN 0.4 MG SL SUBL: 0.4000 mg | SUBLINGUAL_TABLET | SUBLINGUAL | Status: AC | PRN

## 2015-02-24 MED ORDER — PANTOPRAZOLE SODIUM 40 MG PO TBEC: 40.0000 mg | DELAYED_RELEASE_TABLET | Freq: Every day | ORAL | Status: DC

## 2015-02-24 NOTE — Telephone Encounter (Signed)
Pt's avs states see me in 2-3 wks for bp check.  Do you need to see the pt as well or just a bp check w/ keba?

## 2015-02-24 NOTE — Assessment & Plan Note (Addendum)
01/2015 had elevated troponin to 0.11, refused further cardiac workup, admission, cath. Knows this could lead to death and she is ok with this.She feels better today with no further chest pain and fatigue that has improved. She is aware that she may have had a heart attack again.  Patient declined hospice referral but remains DNR/DNI. Extensive discussion today. Wants to continue to control blood pressure and I increased her valsartan. She also wants to continue cholesterol medications. Both previously she had mentioned being weilling to come off of. Wants nitroglycerin at home. Declining hospice.

## 2015-02-24 NOTE — Telephone Encounter (Signed)
Pt needs to see Dr. Yong Channel

## 2015-02-24 NOTE — Progress Notes (Signed)
Garret Reddish, MD  Subjective:  Becky Morales is a 81 y.o. year old very pleasant female patient who presents for follow up of fatigue, presyncope, exertional chest pain, and elevated troponin. With history of hypertension poorly controlled today and cholesterol difficult to control due to myalgias on statins.   Please see my note from last week as well as cardiology. In summary, 81 year old with history of CAD and history NSTEMI presented with above symptoms and found to have elevated troponin. She refused stress testing before known troponin elevation or admission to hospital for further workup after troponin elevation. She told me last week she was very happy with how long she had lived and would not midn if she did not see 94 or even tomorrow. Denied depression. Mentioned she would be ok with coming off medication for quality of life even if this brought the end of her life sooner. From the cardiology office, she refused to go for further workup at hospital and cardiology and I respected her decision and her goals. Her BNP came back only slightly high and not thought likely heart failure despite some increased edema and potential crackles at lung bases (i had not heard at our office)  Patient states she is still tired but has had no more lightheaded spells. She also has not had any more chest pain though she states she ran out of nitroglcyerin and requests refill (there were more ordered previously but I reordered regardless. She is not interested in hospice as of today.   ROS- no cough, congestion, chest pain, DOE, SOB.   Past Medical History- CAD, carcinoid, HTN, Hyperglycemia, spinal stenosis, HLD  Medications- reviewed and updated Current Outpatient Prescriptions  Medication Sig Dispense Refill  . aspirin 81 MG tablet Take 81 mg by mouth daily.     . calcium-vitamin D (OSCAL WITH D) 500-200 MG-UNIT per tablet Take 1 tablet by mouth 2 (two) times daily.    . IRON PO Take 1 tablet by mouth  daily.    . metoprolol tartrate (LOPRESSOR) 25 MG tablet Take 1 tablet (25 mg total) by mouth 2 (two) times daily. 180 tablet 3  . Multiple Vitamin (MULTI VITAMIN DAILY) TABS Take 1 tablet by mouth daily. Pt. Not sure of dose.    . pantoprazole (PROTONIX) 40 MG tablet Take 1 tablet (40 mg total) by mouth daily. 90 tablet 3  . valsartan (DIOVAN) 320 MG tablet Take 1 tablet (320 mg total) by mouth daily. 90 tablet 3  . nitroGLYCERIN (NITROSTAT) 0.4 MG SL tablet Place 1 tablet (0.4 mg total) under the tongue every 5 (five) minutes as needed for chest pain. 25 tablet 12  . [DISCONTINUED] potassium chloride (K-DUR,KLOR-CON) 10 MEQ tablet Take 1 tablet (10 mEq total) by mouth daily. 30 tablet 8   No current facility-administered medications for this visit.    Objective: BP 158/82 mmHg  Pulse 82  Temp(Src) 97.9 F (36.6 C)  Wt 132 lb (59.875 kg) Gen: NAD, resting comfortably CV: RRR no murmurs rubs or gallops Lungs: CTAB no crackles, wheeze, rhonchi (do not hear crackles reported by cardiology) Abdomen: soft/nontender/nondistended/normal bowel sounds. No rebound or guarding.  Ext: 1+ edema unchanged Skin: warm, dry, no rash Neuro: walks slowly but with quicker pace than last week   Assessment/Plan:  Goals of care, counseling/discussion 01/2015 had elevated troponin to 0.11, refused further cardiac workup, admission, cath. Knows this could lead to death and she is ok with this.She feels better today with no further chest  pain and fatigue that has improved. No further presyncope. She is aware that she may have had a heart attack again.  Patient declined hospice referral but remains DNR/DNI. Extensive discussion today. Wants to continue to control blood pressure and I increased her valsartan. She also wants to continue cholesterol medications. Both previously she had mentioned being weilling to come off of. Wants nitroglycerin at home. Declining hospice.   Follow up 2-3 weeks for BP recheck and  continued conversation  Meds ordered this encounter  Medications  . nitroGLYCERIN (NITROSTAT) 0.4 MG SL tablet    Sig: Place 1 tablet (0.4 mg total) under the tongue every 5 (five) minutes as needed for chest pain.    Dispense:  25 tablet    Refill:  12  . valsartan (DIOVAN) 320 MG tablet    Sig: Take 1 tablet (320 mg total) by mouth daily.    Dispense:  90 tablet    Refill:  3  . pantoprazole (PROTONIX) 40 MG tablet    Sig: Take 1 tablet (40 mg total) by mouth daily.    Dispense:  90 tablet    Refill:  3  . metoprolol tartrate (LOPRESSOR) 25 MG tablet    Sig: Take 1 tablet (25 mg total) by mouth 2 (two) times daily.    Dispense:  180 tablet    Refill:  3   >50% of 30 minute office visit was spent on counseling (goals of care) and coordination of care

## 2015-02-24 NOTE — Patient Instructions (Addendum)
You may have had a heart attack last week. You at minimum had strain on your heart. You are refusing to have a heart catheterization. We will respect your decision.   You declined hospice.   You requested that we control your blood pressure. We will increase your valsartan to 320mg   Glad you havent had any chest pain, refilled your nitroglycerin in case you need it  See me in 2-3 weeks for blood pressure recheck. You are going to keep thinking about hospice and your long term wishes  Refills I provided today Meds ordered this encounter  Medications  . nitroGLYCERIN (NITROSTAT) 0.4 MG SL tablet    Sig: Place 1 tablet (0.4 mg total) under the tongue every 5 (five) minutes as needed for chest pain.    Dispense:  25 tablet    Refill:  12  . valsartan (DIOVAN) 320 MG tablet    Sig: Take 1 tablet (320 mg total) by mouth daily.    Dispense:  90 tablet    Refill:  3  . pantoprazole (PROTONIX) 40 MG tablet    Sig: Take 1 tablet (40 mg total) by mouth daily.    Dispense:  90 tablet    Refill:  3  . metoprolol tartrate (LOPRESSOR) 25 MG tablet    Sig: Take 1 tablet (25 mg total) by mouth 2 (two) times daily.    Dispense:  180 tablet    Refill:  3

## 2015-02-24 NOTE — Telephone Encounter (Signed)
Pt has appt w/ dr hunter!

## 2015-03-08 ENCOUNTER — Telehealth: Payer: Self-pay | Admitting: Cardiology

## 2015-03-08 ENCOUNTER — Telehealth: Payer: Self-pay | Admitting: Family Medicine

## 2015-03-08 NOTE — Telephone Encounter (Signed)
Pt would like to discuss w/ you about her appt w/ dr hunter on Friday. Pt has an appt w/ the heart dr on Monday and does not know why she needs both.  pls advise

## 2015-03-08 NOTE — Telephone Encounter (Signed)
Pt stated that she was in to see Angelena Form, PA on 02/17/15 and wasn't sure if she needed to keep this appt. Informed pt that I seen where she had had a conversation with Curt Bears and Dr. Yong Channel in regards to her care. Note stated that if she decided to go with palliative care, she would no longer need to f/u with cardiology. Pt states that she does want to continue to f/u with cardiology and would like to see Dr. Aundra Dubin. Informed pt that appt on Monday is with Estella Husk, PA. Pt states that she is seeing Dr. Yong Channel on Friday and will call after this appt if she decides not to keep Monday appt.

## 2015-03-08 NOTE — Telephone Encounter (Signed)
New Message   Pt requested to speak w. Rn to see if appt on 5/23 is necessary. Pt worried that she can not have OV at our office on Monday and also see her PCP on the Friday before 5/20. Please call back and discuss.

## 2015-03-09 NOTE — Telephone Encounter (Signed)
Called and left message on pt cell vm and called the pt home phone and it just rang.

## 2015-03-10 ENCOUNTER — Encounter: Payer: Self-pay | Admitting: Family Medicine

## 2015-03-10 ENCOUNTER — Ambulatory Visit (INDEPENDENT_AMBULATORY_CARE_PROVIDER_SITE_OTHER): Payer: Medicare Other | Admitting: Family Medicine

## 2015-03-10 VITALS — BP 158/78 | HR 73 | Temp 97.8°F | Wt 132.0 lb

## 2015-03-10 DIAGNOSIS — I2510 Atherosclerotic heart disease of native coronary artery without angina pectoris: Secondary | ICD-10-CM | POA: Diagnosis not present

## 2015-03-10 DIAGNOSIS — I10 Essential (primary) hypertension: Secondary | ICD-10-CM

## 2015-03-10 DIAGNOSIS — E785 Hyperlipidemia, unspecified: Secondary | ICD-10-CM | POA: Diagnosis not present

## 2015-03-10 LAB — BASIC METABOLIC PANEL
BUN: 22 mg/dL (ref 6–23)
CHLORIDE: 91 meq/L — AB (ref 96–112)
CO2: 26 mEq/L (ref 19–32)
Calcium: 9.6 mg/dL (ref 8.4–10.5)
Creatinine, Ser: 1.09 mg/dL (ref 0.40–1.20)
GFR: 60.22 mL/min (ref >60.00)
Glucose, Bld: 92 mg/dL (ref 70–99)
POTASSIUM: 4.3 meq/L (ref 3.5–5.1)
SODIUM: 126 meq/L — AB (ref 135–145)

## 2015-03-10 MED ORDER — HYDROCHLOROTHIAZIDE 25 MG PO TABS: 25.0000 mg | ORAL_TABLET | Freq: Every day | ORAL | Status: DC

## 2015-03-10 MED ORDER — VALSARTAN 160 MG PO TABS
160.0000 mg | ORAL_TABLET | Freq: Every day | ORAL | Status: DC
Start: 2015-03-10 — End: 2015-06-02

## 2015-03-10 NOTE — Assessment & Plan Note (Addendum)
After at least a 20 minute conversation where patient went back and forth on her desired plan for cardiology follow up, potential stress test, cath-->patient ultimately decided she wants to keep cardiology appointment next week. She states the reason she didn't want admission previously is because she had a long day. States she would be fine if she didn't make it to 69 but wants to do everything she can to make it there. She also particularly enjoys Dr. Aundra Dubin and wants his insight on what she should do.   She is going to continue aspirin as well as once weekly atorvastatin. Fortunately, chest pain has resolved without intervention.

## 2015-03-10 NOTE — Assessment & Plan Note (Signed)
Controlled on last check but difficult to tell what patient was taking at that time. She is compliant and tolerating atorvastatin once a week though at 80mg  so will continue and consider direct LDL check next visit.

## 2015-03-10 NOTE — Assessment & Plan Note (Signed)
Patient wants to control blood pressure but currently only taking hctz 25mg  and metoprolol 25mg  BID and poor control at 158/78 today. I originally prescribed HCTZ again but after review of bmet trends, have asked patient to stop takign medication. She will continue on valsartan 160mg  and metoprolol 25mg  BID though I suspect BP will not be controlled on this regimen. Would consider amlodipine if not controlled at cardiology visit (despite trace edema). She had previously stated she wanted to come off BP meds and now clearly wants to take them.

## 2015-03-10 NOTE — Patient Instructions (Signed)
We are going to check your kidney function and sodium again today. I may have to stop the hydrochlorothiazide 25mg  if your sodium is still low. We changed your valsartan back to 160mg . We will call you.   You decided that you wanted to go to cardiology-see appointment at top of the sheet. You changed your mind and said you would want to have a stress test and potentially a catheterization.

## 2015-03-10 NOTE — Progress Notes (Signed)
Garret Reddish, MD  Subjective:  Becky Morales is a 81 y.o. year old very pleasant female patient who presents with:  CAD- uncertain status given history in 01/2015.  -02/24/2015 had elevated troponin to 0.11, refused further cardiac workup, admission, cath. Saw patient on 02/24/15 in follow up to consider hospice which she declined. Wants to remain DNR/DNI.   Today, patient states the reason she did not want admission in that day is because it had been a long day and she just wanted to go home. Now states she would go through with stress test and potential catheterization if needed. Despite saying this, she goes back and forth multiple times within the visit. She is no longer having exertional chest pain but does have fatigue but much better than initial day she presented.   ROS- denies shortness of breath, worsening edema  Hypertension-poor control on hctz 25mg  alone (which she was not supposed to be taking after futher review) . Did not take valsartan 320 mg as it was a "horse pill that makes me feel funny"  BP Readings from Last 3 Encounters:  03/10/15 158/78  02/24/15 158/82  02/17/15 162/50  ROS-Denies any CP, HA, SOB, blurry vision, LE edema, transient weakness, orthopnea, PND.   Hyperlipidemia-controlled on last check, uncertain current status on once a week atorvastatin 80mg   Lab Results  Component Value Date   LDLCALC 64 10/05/2014  ROS- no chest pain or shortness of breath. No myalgias  Past Medical History- hyperglycemia, spinal stenosis, HLD.   Medications- reviewed and updated Current Outpatient Prescriptions  Medication Sig Dispense Refill  . aspirin 81 MG tablet Take 81 mg by mouth daily.     . calcium-vitamin D (OSCAL WITH D) 500-200 MG-UNIT per tablet Take 1 tablet by mouth 2 (two) times daily.    . IRON PO Take 1 tablet by mouth daily.    . metoprolol tartrate (LOPRESSOR) 25 MG tablet Take 1 tablet (25 mg total) by mouth 2 (two) times daily. 180 tablet 3  . Multiple  Vitamin (MULTI VITAMIN DAILY) TABS Take 1 tablet by mouth daily. Pt. Not sure of dose.    . pantoprazole (PROTONIX) 40 MG tablet Take 1 tablet (40 mg total) by mouth daily. 90 tablet 3  . valsartan (DIOVAN) 160 MG tablet Take 1 tablet (160 mg total) by mouth daily. 90 tablet 3  . hydrochlorothiazide (HYDRODIURIL) 25 MG tablet Take 1 tablet (25 mg total) by mouth daily. 90 tablet 3  . nitroGLYCERIN (NITROSTAT) 0.4 MG SL tablet Place 1 tablet (0.4 mg total) under the tongue every 5 (five) minutes as needed for chest pain. (Patient not taking: Reported on 03/10/2015) 25 tablet 12   Objective: BP 158/78 mmHg  Pulse 73  Temp(Src) 97.8 F (36.6 C)  Wt 132 lb (59.875 kg) Gen: NAD, resting comfortably CV: RRR no murmurs rubs or gallops, occasional ectopic beat Lungs: CTAB no crackles, wheeze, rhonchi Abdomen: soft/nontender/nondistended/normal bowel sounds.  Ext: trace edema Skin: warm, dry, no rash  Neuro: grossly normal, moves all extremities   Assessment/Plan:  CAD (coronary artery disease)  s/p  PCI for NSTEMI After at least a 20 minute conversation where patient went back and forth on her desired plan for cardiology follow up, potential stress test, cath-->patient ultimately decided she wants to keep cardiology appointment next week. She states the reason she didn't want admission previously is because she had a long day. States she would be fine if she didn't make it to 41 but wants to do  everything she can to make it there. She also particularly enjoys Dr. Aundra Dubin and wants his insight on what she should do.   She is going to continue aspirin as well as once weekly atorvastatin. Fortunately, chest pain has resolved without intervention.    Essential hypertension Patient wants to control blood pressure but currently only taking hctz 25mg  and metoprolol 25mg  BID and poor control at 158/78 today. I originally prescribed HCTZ again but after review of bmet trends, have asked patient to stop  takign medication. She will continue on valsartan 160mg  and metoprolol 25mg  BID though I suspect BP will not be controlled on this regimen. Would consider amlodipine if not controlled at cardiology visit (despite trace edema). She had previously stated she wanted to come off BP meds and now clearly wants to take them.    Hyperlipidemia Controlled on last check but difficult to tell what patient was taking at that time. She is compliant and tolerating atorvastatin once a week though at 80mg  so will continue and consider direct LDL check next visit.     Orders Placed This Encounter  Procedures  . Basic metabolic panel    [place]    Meds ordered this encounter  Medications  . valsartan (DIOVAN) 160 MG tablet    Sig: Take 1 tablet (160 mg total) by mouth daily.    Dispense:  90 tablet    Refill:  3  . DISCONTD: hydrochlorothiazide (HYDRODIURIL) 25 MG tablet    Sig: Take 1 tablet (25 mg total) by mouth daily.    Dispense:  90 tablet    Refill:  3  . atorvastatin (LIPITOR) 80 MG tablet    Sig: Take 80 mg by mouth once a week.

## 2015-03-13 ENCOUNTER — Ambulatory Visit: Payer: Medicare Other | Admitting: Physician Assistant

## 2015-03-15 ENCOUNTER — Ambulatory Visit: Payer: Medicare Other | Admitting: Cardiology

## 2015-03-22 DIAGNOSIS — N390 Urinary tract infection, site not specified: Secondary | ICD-10-CM

## 2015-03-22 HISTORY — DX: Urinary tract infection, site not specified: N39.0

## 2015-03-29 ENCOUNTER — Ambulatory Visit: Payer: Medicare Other | Admitting: Family Medicine

## 2015-04-05 ENCOUNTER — Other Ambulatory Visit (HOSPITAL_COMMUNITY): Payer: Self-pay

## 2015-04-05 ENCOUNTER — Emergency Department (HOSPITAL_COMMUNITY): Payer: Medicare Other

## 2015-04-05 ENCOUNTER — Encounter (HOSPITAL_COMMUNITY): Payer: Self-pay

## 2015-04-05 ENCOUNTER — Inpatient Hospital Stay (HOSPITAL_COMMUNITY)
Admission: EM | Admit: 2015-04-05 | Discharge: 2015-04-07 | DRG: 690 | Disposition: A | Discharge status: Home Health | Payer: Medicare Other | Attending: Internal Medicine | Admitting: Internal Medicine

## 2015-04-05 DIAGNOSIS — Z823 Family history of stroke: Secondary | ICD-10-CM

## 2015-04-05 DIAGNOSIS — Z8249 Family history of ischemic heart disease and other diseases of the circulatory system: Secondary | ICD-10-CM

## 2015-04-05 DIAGNOSIS — R531 Weakness: Secondary | ICD-10-CM

## 2015-04-05 DIAGNOSIS — Z66 Do not resuscitate: Secondary | ICD-10-CM | POA: Diagnosis present

## 2015-04-05 DIAGNOSIS — I25118 Atherosclerotic heart disease of native coronary artery with other forms of angina pectoris: Secondary | ICD-10-CM | POA: Diagnosis present

## 2015-04-05 DIAGNOSIS — K219 Gastro-esophageal reflux disease without esophagitis: Secondary | ICD-10-CM | POA: Diagnosis present

## 2015-04-05 DIAGNOSIS — R300 Dysuria: Secondary | ICD-10-CM | POA: Diagnosis not present

## 2015-04-05 DIAGNOSIS — I10 Essential (primary) hypertension: Secondary | ICD-10-CM | POA: Diagnosis present

## 2015-04-05 DIAGNOSIS — I5032 Chronic diastolic (congestive) heart failure: Secondary | ICD-10-CM | POA: Diagnosis present

## 2015-04-05 DIAGNOSIS — Z515 Encounter for palliative care: Secondary | ICD-10-CM

## 2015-04-05 DIAGNOSIS — E860 Dehydration: Secondary | ICD-10-CM | POA: Diagnosis present

## 2015-04-05 DIAGNOSIS — Z79899 Other long term (current) drug therapy: Secondary | ICD-10-CM

## 2015-04-05 DIAGNOSIS — N390 Urinary tract infection, site not specified: Secondary | ICD-10-CM | POA: Diagnosis not present

## 2015-04-05 DIAGNOSIS — Z7982 Long term (current) use of aspirin: Secondary | ICD-10-CM

## 2015-04-05 DIAGNOSIS — R079 Chest pain, unspecified: Secondary | ICD-10-CM

## 2015-04-05 DIAGNOSIS — I208 Other forms of angina pectoris: Secondary | ICD-10-CM | POA: Diagnosis present

## 2015-04-05 DIAGNOSIS — I252 Old myocardial infarction: Secondary | ICD-10-CM

## 2015-04-05 DIAGNOSIS — R29898 Other symptoms and signs involving the musculoskeletal system: Secondary | ICD-10-CM

## 2015-04-05 DIAGNOSIS — E871 Hypo-osmolality and hyponatremia: Secondary | ICD-10-CM | POA: Diagnosis present

## 2015-04-05 DIAGNOSIS — I2089 Other forms of angina pectoris: Secondary | ICD-10-CM | POA: Diagnosis present

## 2015-04-05 HISTORY — DX: Urinary tract infection, site not specified: N39.0

## 2015-04-05 LAB — TROPONIN I: Troponin I: <0.03 ng/mL (ref <0.031)

## 2015-04-05 LAB — COMPREHENSIVE METABOLIC PANEL
ALK PHOS: 102 U/L (ref 38–126)
ALT: 35 U/L (ref 14–54)
AST: 31 U/L (ref 15–41)
Albumin: 3.3 g/dL — ABNORMAL LOW (ref 3.5–5.0)
Anion gap: 9 (ref 5–15)
BUN: 19 mg/dL (ref 6–20)
CO2: 24 mmol/L (ref 22–32)
Calcium: 9.1 mg/dL (ref 8.9–10.3)
Chloride: 92 mmol/L — ABNORMAL LOW (ref 101–111)
Creatinine, Ser: 1.18 mg/dL — ABNORMAL HIGH (ref 0.44–1.00)
GFR, EST AFRICAN AMERICAN: 45 mL/min — AB (ref >60)
GFR, EST NON AFRICAN AMERICAN: 39 mL/min — AB (ref >60)
GLUCOSE: 121 mg/dL — AB (ref 65–99)
POTASSIUM: 4.2 mmol/L (ref 3.5–5.1)
Sodium: 125 mmol/L — ABNORMAL LOW (ref 135–145)
Total Bilirubin: 1.1 mg/dL (ref 0.3–1.2)
Total Protein: 6.7 g/dL (ref 6.5–8.1)

## 2015-04-05 LAB — CBC WITH DIFFERENTIAL/PLATELET
Basophils Absolute: 0 10*3/uL (ref 0.0–0.1)
Basophils Relative: 0 % (ref 0–1)
EOS PCT: 1 % (ref 0–5)
Eosinophils Absolute: 0.1 10*3/uL (ref 0.0–0.7)
HCT: 45.5 % (ref 36.0–46.0)
Hemoglobin: 16.3 g/dL — ABNORMAL HIGH (ref 12.0–15.0)
LYMPHS ABS: 1 10*3/uL (ref 0.7–4.0)
Lymphocytes Relative: 18 % (ref 12–46)
MCH: 31.5 pg (ref 26.0–34.0)
MCHC: 35.8 g/dL (ref 30.0–36.0)
MCV: 87.8 fL (ref 78.0–100.0)
MONO ABS: 0.7 10*3/uL (ref 0.1–1.0)
Monocytes Relative: 13 % — ABNORMAL HIGH (ref 3–12)
Neutro Abs: 3.6 10*3/uL (ref 1.7–7.7)
Neutrophils Relative %: 67 % (ref 43–77)
Platelets: 366 10*3/uL (ref 150–400)
RBC: 5.18 MIL/uL — ABNORMAL HIGH (ref 3.87–5.11)
RDW: 15.2 % (ref 11.5–15.5)
WBC: 5.3 10*3/uL (ref 4.0–10.5)

## 2015-04-05 NOTE — ED Notes (Signed)
Patient transported to CT 

## 2015-04-05 NOTE — ED Notes (Signed)
MD at bedside. 

## 2015-04-05 NOTE — ED Provider Notes (Signed)
CSN: [id number]     Arrival date & time 04/05/15  2046 History   First MD Initiated Contact with Patient 04/05/15 2052     Chief Complaint  Patient presents with  . Fatigue  . Chest Pain  . Urinary Tract Infection     (Consider location/radiation/quality/duration/timing/severity/associated sxs/prior Treatment) Patient is a 81 y.o. female presenting with chest pain and urinary tract infection. The history is provided by the patient and a relative.  Chest Pain Pain location:  Substernal area Pain quality comment:  Could not qualify Pain radiates to:  Does not radiate Pain radiates to the back: no   Pain severity:  Mild Onset quality:  Gradual Timing:  Constant Progression:  Resolved Chronicity:  Recurrent Context: at rest   Relieved by:  Nitroglycerin Worsened by:  Nothing tried Ineffective treatments:  None tried Associated symptoms: weakness   Associated symptoms: no abdominal pain, no back pain, no cough, no dizziness, no fatigue, no fever, no headache, no nausea, no shortness of breath and not vomiting   Urinary Tract Infection Associated symptoms: no abdominal pain, no fever, no nausea and no vomiting     Past Medical History  Diagnosis Date  . CARCINOID TUMOR 04/13/2007  . HYPERTENSION 04/13/2007  . ALLERGIC RHINITIS 04/13/2007  . GERD 04/13/2007  . HYPERGLYCEMIA 04/13/2007  . CHOLELITHIASIS 05/02/2010  . Arthritis   . Cancer   . Environmental allergies     has mucus in throat  . Diphtheria     age 66  . CAD (coronary artery disease)     NSTEMI.  LHC 09/03/11: pLAD 40-50%, pDx 80-90% (very small), lateral branch of OM 99%, m-dCFX 40%, oRCA 80%, m-dRCA 50-70%, EF 55%.  She had PCI with BMS to lateral branch of the OM.  RCA was tx medically.   . Hyponatremia   . Murmur     echo 11/12: EF 55-60%, mod calcified AV annulus, mild AI, mild MR  . Dermatophytosis of nail    Past Surgical History  Procedure Laterality Date  . Tonsillectomy      many years ago  . Cardiac  surgery    . Colon surgery      CANCER  . Small bowel repair    . Left heart catheterization with coronary angiogram N/A 09/03/2011    Procedure: LEFT HEART CATHETERIZATION WITH CORONARY ANGIOGRAM;  Surgeon: Peter M Martinique, MD;  Location: [facility] CATH LAB;  Service: Cardiovascular;  Laterality: N/A;  . Percutaneous coronary stent intervention (pci-s)  09/03/2011    Procedure: PERCUTANEOUS CORONARY STENT INTERVENTION (PCI-S);  Surgeon: Peter M Martinique, MD;  Location: [facility] CATH LAB;  Service: Cardiovascular;;   Family History  Problem Relation Age of Onset  . Hypertension Mother   . Uterine cancer Mother   . Ovarian cancer Mother   . Hypertension Father   . Stroke Father   . Hypertension Brother   . Hyperlipidemia Brother   . Heart attack Brother   . Lung cancer Sister    History  Substance Use Topics  . Smoking status: Never Smoker   . Smokeless tobacco: Never Used  . Alcohol Use: No   OB History    No data available     Review of Systems  Constitutional: Negative for fever and fatigue.  HENT: Negative for congestion and drooling.   Eyes: Negative for pain.  Respiratory: Negative for cough and shortness of breath.   Cardiovascular: Positive for chest pain.  Gastrointestinal: Negative for nausea, vomiting, abdominal pain and diarrhea.  Genitourinary:  Positive for dysuria. Negative for hematuria.  Musculoskeletal: Negative for back pain, gait problem and neck pain.  Skin: Negative for color change.  Neurological: Positive for weakness. Negative for dizziness and headaches.  Hematological: Negative for adenopathy.  Psychiatric/Behavioral: Negative for behavioral problems.  All other systems reviewed and are negative.     Allergies  Ciprofloxacin and Paxil  Home Medications   Prior to Admission medications   Medication Sig Start Date End Date Taking? Authorizing Provider  aspirin 81 MG tablet Take 81 mg by mouth daily.     Historical Provider, MD  atorvastatin (LIPITOR) 80  MG tablet Take 80 mg by mouth once a week.    Historical Provider, MD  calcium-vitamin D (OSCAL WITH D) 500-200 MG-UNIT per tablet Take 1 tablet by mouth 2 (two) times daily.    Historical Provider, MD  IRON PO Take 1 tablet by mouth daily.    Historical Provider, MD  metoprolol tartrate (LOPRESSOR) 25 MG tablet Take 1 tablet (25 mg total) by mouth 2 (two) times daily. 02/24/15   Marin Olp, MD  Multiple Vitamin (MULTI VITAMIN DAILY) TABS Take 1 tablet by mouth daily. Pt. Not sure of dose.    Historical Provider, MD  nitroGLYCERIN (NITROSTAT) 0.4 MG SL tablet Place 1 tablet (0.4 mg total) under the tongue every 5 (five) minutes as needed for chest pain. Patient not taking: Reported on 03/10/2015 02/24/15   Marin Olp, MD  pantoprazole (PROTONIX) 40 MG tablet Take 1 tablet (40 mg total) by mouth daily. 02/24/15   Marin Olp, MD  valsartan (DIOVAN) 160 MG tablet Take 1 tablet (160 mg total) by mouth daily. 03/10/15   Marin Olp, MD   BP 186/66 mmHg  Pulse 78  Temp(Src) 98.1 F (36.7 C) (Oral)  Resp 21  SpO2 98% Physical Exam  Constitutional: She is oriented to person, place, and time. She appears well-developed and well-nourished.  HENT:  Head: Normocephalic.  Mouth/Throat: Oropharynx is clear and moist. No oropharyngeal exudate.  Eyes: Conjunctivae and EOM are normal. Pupils are equal, round, and reactive to light.  Neck: Normal range of motion. Neck supple.  Cardiovascular: Normal rate, regular rhythm, normal heart sounds and intact distal pulses.  Exam reveals no gallop and no friction rub.   No murmur heard. Pulmonary/Chest: Effort normal and breath sounds normal. No respiratory distress. She has no wheezes.  Abdominal: Soft. Bowel sounds are normal. There is tenderness (mild suprapubic tenderness.). There is no rebound and no guarding.  Musculoskeletal: Normal range of motion. She exhibits no edema or tenderness.  Neurological: She is alert and oriented to person,  place, and time.  alert, oriented x3 speech: normal in context and clarity memory: intact grossly cranial nerves II-XII: intact motor strength: full proximally and distally sensation: intact to light touch diffusely  cerebellar: finger-to-nose intact gait: Unsteady on her feet and unable to ambulate  Skin: Skin is warm and dry.  Psychiatric: She has a normal mood and affect. Her behavior is normal.  Nursing note and vitals reviewed.   ED Course  Procedures (including critical care time) Labs Review Labs Reviewed  CBC WITH DIFFERENTIAL/PLATELET - Abnormal; Notable for the following:    RBC 5.18 (*)    Hemoglobin 16.3 (*)    Monocytes Relative 13 (*)    All other components within normal limits  COMPREHENSIVE METABOLIC PANEL - Abnormal; Notable for the following:    Sodium 125 (*)    Chloride 92 (*)    Glucose,  Bld 121 (*)    Creatinine, Ser 1.18 (*)    Albumin 3.3 (*)    GFR calc non Af Amer 39 (*)    GFR calc Af Amer 45 (*)    All other components within normal limits  URINALYSIS, ROUTINE W REFLEX MICROSCOPIC (NOT AT [facility]) - Abnormal; Notable for the following:    APPearance CLOUDY (*)    Specific Gravity, Urine <1.005 (*)    Nitrite POSITIVE (*)    Leukocytes, UA SMALL (*)    All other components within normal limits  URINE MICROSCOPIC-ADD ON - Abnormal; Notable for the following:    Bacteria, UA MANY (*)    All other components within normal limits  URINE CULTURE  TROPONIN I  BASIC METABOLIC PANEL    Imaging Review Dg Chest 2 View  04/05/2015   CLINICAL DATA:  Pain across the chest earlier today, relief with nitroglycerin. Weakness this evening. Difficulty ambulating. Secondhand smoke exposure.  EXAM: CHEST  2 VIEW  COMPARISON:  02/06/2014  FINDINGS: Normal heart size and pulmonary vascularity. No focal airspace disease or consolidation in the lungs. No blunting of costophrenic angles. No pneumothorax. Mediastinal contours appear intact. Calcified and tortuous aorta.  Degenerative changes in the spine and shoulders.  IMPRESSION: No active cardiopulmonary disease.   Electronically Signed   By: Lucienne Capers M.D.   On: 04/05/2015 22:25   Ct Head Wo Contrast  04/05/2015   CLINICAL DATA:  Acute onset of difficulty moving legs. Initial encounter.  EXAM: CT HEAD WITHOUT CONTRAST  TECHNIQUE: Contiguous axial images were obtained from the base of the skull through the vertex without intravenous contrast.  COMPARISON:  CT of the head performed 02/06/2014  FINDINGS: There is no evidence of acute infarction, mass lesion, or intra- or extra-axial hemorrhage on CT.  Prominence of the ventricles and sulci reflects mild to moderate cortical volume loss. Scattered periventricular and subcortical white matter change likely reflects small vessel ischemic microangiopathy.  The brainstem and fourth ventricle are within normal limits. The basal ganglia are unremarkable in appearance. The cerebral hemispheres demonstrate grossly normal gray-white differentiation. No mass effect or midline shift is seen.  There is no evidence of fracture; visualized osseous structures are unremarkable in appearance. The orbits are within normal limits. The paranasal sinuses and mastoid air cells are well-aerated. No significant soft tissue abnormalities are seen.  IMPRESSION: 1. No acute intracranial pathology seen on CT. 2. Mild to moderate cortical volume loss and scattered small vessel ischemic microangiopathy.   Electronically Signed   By: Garald Balding M.D.   On: 04/05/2015 22:53     EKG Interpretation None     CRITICAL CARE Performed by: Pamella Pert, S Total critical care time: 30 min Critical care time was exclusive of separately billable procedures and treating other patients. Critical care was necessary to treat or prevent imminent or life-threatening deterioration. Critical care was time spent personally by me on the following activities: development of treatment plan with patient  and/or surrogate as well as nursing, discussions with consultants, evaluation of patient's response to treatment, examination of patient, obtaining history from patient or surrogate, ordering and performing treatments and interventions, ordering and review of laboratory studies, ordering and review of radiographic studies, pulse oximetry and re-evaluation of patient's condition.  MDM   Final diagnoses:  Chest pain  UTI (lower urinary tract infection)  Weakness of both lower extremities  Hyponatremia    9:19 PM 81 y.o. female with a history of coronary artery disease, hyponatremia  who presents with multiple complaints. Patient stating that she has had some dysuria and suprapubic pressure over the last week. She also had some central chest pain several hours ago which was relieved with a nitroglycerin. Her cousin came to check on her this evening and noted that she was unable to ambulate to the bathroom. She normally ambulates with a cane. She needed significant assistance. The patient states that she feels weak in her lower extremities. Vital signs unremarkable here. She denies chest pain on exam. We'll get screening labs and imaging.  Pt with UTI, inability to ambulate d/t LE weakness, and hyponatremia requiring admission. Hospitalist to admit.   Pamella Pert, MD 04/06/15 1201

## 2015-04-05 NOTE — ED Notes (Signed)
Patient transported to X-ray & CT °

## 2015-04-05 NOTE — ED Notes (Signed)
Per EMS pt started having  Progressive weakness this afternoon; pt was able to do ADL ealier today but as the day passed pt was unable to ambulate; Pt had friend who assisted her to bed and called EMS; Pt normally able to ambulate independent with cane; Pt lives a lone; Pt c/o of pelvic pain and burning on urination with EMS; Pt also stated to EMS she took 1 nitro for chest pain. Pt a&o x 4 on arrival

## 2015-04-06 ENCOUNTER — Encounter (HOSPITAL_COMMUNITY): Payer: Self-pay | Admitting: General Practice

## 2015-04-06 DIAGNOSIS — I10 Essential (primary) hypertension: Secondary | ICD-10-CM | POA: Diagnosis not present

## 2015-04-06 DIAGNOSIS — Z8249 Family history of ischemic heart disease and other diseases of the circulatory system: Secondary | ICD-10-CM | POA: Diagnosis not present

## 2015-04-06 DIAGNOSIS — K219 Gastro-esophageal reflux disease without esophagitis: Secondary | ICD-10-CM | POA: Diagnosis present

## 2015-04-06 DIAGNOSIS — Z823 Family history of stroke: Secondary | ICD-10-CM | POA: Diagnosis not present

## 2015-04-06 DIAGNOSIS — Z515 Encounter for palliative care: Secondary | ICD-10-CM | POA: Diagnosis not present

## 2015-04-06 DIAGNOSIS — I252 Old myocardial infarction: Secondary | ICD-10-CM | POA: Diagnosis not present

## 2015-04-06 DIAGNOSIS — Z79899 Other long term (current) drug therapy: Secondary | ICD-10-CM | POA: Diagnosis not present

## 2015-04-06 DIAGNOSIS — I25118 Atherosclerotic heart disease of native coronary artery with other forms of angina pectoris: Secondary | ICD-10-CM | POA: Diagnosis present

## 2015-04-06 DIAGNOSIS — R531 Weakness: Secondary | ICD-10-CM | POA: Diagnosis not present

## 2015-04-06 DIAGNOSIS — N390 Urinary tract infection, site not specified: Principal | ICD-10-CM

## 2015-04-06 DIAGNOSIS — R300 Dysuria: Secondary | ICD-10-CM | POA: Diagnosis present

## 2015-04-06 DIAGNOSIS — Z7982 Long term (current) use of aspirin: Secondary | ICD-10-CM | POA: Diagnosis not present

## 2015-04-06 DIAGNOSIS — E871 Hypo-osmolality and hyponatremia: Secondary | ICD-10-CM | POA: Diagnosis present

## 2015-04-06 DIAGNOSIS — E860 Dehydration: Secondary | ICD-10-CM | POA: Diagnosis present

## 2015-04-06 DIAGNOSIS — I208 Other forms of angina pectoris: Secondary | ICD-10-CM | POA: Diagnosis present

## 2015-04-06 DIAGNOSIS — I5032 Chronic diastolic (congestive) heart failure: Secondary | ICD-10-CM | POA: Diagnosis not present

## 2015-04-06 DIAGNOSIS — Z66 Do not resuscitate: Secondary | ICD-10-CM | POA: Diagnosis present

## 2015-04-06 LAB — BASIC METABOLIC PANEL
Anion gap: 9 (ref 5–15)
BUN: 16 mg/dL (ref 6–20)
CO2: 25 mmol/L (ref 22–32)
Calcium: 8.8 mg/dL — ABNORMAL LOW (ref 8.9–10.3)
Chloride: 96 mmol/L — ABNORMAL LOW (ref 101–111)
Creatinine, Ser: 0.97 mg/dL (ref 0.44–1.00)
GFR calc Af Amer: 57 mL/min — ABNORMAL LOW (ref >60)
GFR calc non Af Amer: 49 mL/min — ABNORMAL LOW (ref >60)
Glucose, Bld: 119 mg/dL — ABNORMAL HIGH (ref 65–99)
Potassium: 4 mmol/L (ref 3.5–5.1)
Sodium: 130 mmol/L — ABNORMAL LOW (ref 135–145)

## 2015-04-06 LAB — URINALYSIS, ROUTINE W REFLEX MICROSCOPIC
Bilirubin Urine: NEGATIVE
GLUCOSE, UA: NEGATIVE mg/dL
Hgb urine dipstick: NEGATIVE
Ketones, ur: NEGATIVE mg/dL
Nitrite: POSITIVE — AB
Protein, ur: NEGATIVE mg/dL
UROBILINOGEN UA: 0.2 mg/dL (ref 0.0–1.0)
pH: 7 (ref 5.0–8.0)

## 2015-04-06 LAB — URINE MICROSCOPIC-ADD ON

## 2015-04-06 MED ORDER — ENSURE ENLIVE PO LIQD
237.0000 mL | Freq: Two times a day (BID) | ORAL | Status: DC
  Administered 2015-04-06 – 2015-04-07 (×3): 237 mL via ORAL

## 2015-04-06 MED ORDER — METOPROLOL TARTRATE 25 MG PO TABS
25.0000 mg | ORAL_TABLET | Freq: Two times a day (BID) | ORAL | Status: DC
  Administered 2015-04-06 – 2015-04-07 (×4): 25 mg via ORAL
  Filled 2015-04-06 (×4): qty 1

## 2015-04-06 MED ORDER — CALCIUM CARBONATE-VITAMIN D 500-200 MG-UNIT PO TABS
1.0000 | ORAL_TABLET | Freq: Two times a day (BID) | ORAL | Status: DC
  Administered 2015-04-06 – 2015-04-07 (×3): 1 via ORAL
  Filled 2015-04-06 (×3): qty 1

## 2015-04-06 MED ORDER — SODIUM CHLORIDE 0.9 % IV BOLUS (SEPSIS)
500.0000 mL | Freq: Once | INTRAVENOUS | Status: AC
  Administered 2015-04-06: 500 mL via INTRAVENOUS

## 2015-04-06 MED ORDER — DEXTROSE 5 % IV SOLN
1.0000 g | INTRAVENOUS | Status: DC
  Administered 2015-04-06 (×2): 1 g via INTRAVENOUS
  Filled 2015-04-06 (×3): qty 10

## 2015-04-06 MED ORDER — IRBESARTAN 150 MG PO TABS
150.0000 mg | ORAL_TABLET | Freq: Every day | ORAL | Status: DC
  Administered 2015-04-06 – 2015-04-07 (×2): 150 mg via ORAL
  Filled 2015-04-06 (×2): qty 1

## 2015-04-06 MED ORDER — ASPIRIN EC 81 MG PO TBEC
81.0000 mg | DELAYED_RELEASE_TABLET | Freq: Every day | ORAL | Status: DC
  Administered 2015-04-06 – 2015-04-07 (×2): 81 mg via ORAL
  Filled 2015-04-06 (×2): qty 1

## 2015-04-06 MED ORDER — PANTOPRAZOLE SODIUM 40 MG PO TBEC
40.0000 mg | DELAYED_RELEASE_TABLET | Freq: Every day | ORAL | Status: DC
  Administered 2015-04-06 – 2015-04-07 (×2): 40 mg via ORAL
  Filled 2015-04-06 (×2): qty 1

## 2015-04-06 MED ORDER — ATORVASTATIN CALCIUM 80 MG PO TABS: 80.0000 mg | ORAL_TABLET | ORAL | Status: DC

## 2015-04-06 MED ORDER — HEPARIN SODIUM (PORCINE) 5000 UNIT/ML IJ SOLN
5000.0000 [IU] | Freq: Three times a day (TID) | INTRAMUSCULAR | Status: DC
  Administered 2015-04-06 – 2015-04-07 (×4): 5000 [IU] via SUBCUTANEOUS
  Filled 2015-04-06 (×3): qty 1

## 2015-04-06 NOTE — H&P (Signed)
[facility] History and Physical  Becky Morales HKV:[id number] DOB: May 14, 1920 DOA: 04/05/2015  Referring physician: EDP PCP: Garret Reddish, MD   Chief Complaint: Dysuria, generalized weakness   HPI: Becky Morales is a 81 y.o. female who presents to the ED with c/o dysuria, BLE weakness and inability to ambulate onset this evening.  There was also an episode of substernal, non-radiating chest pain this evening that resolved with NTG at home.  It appears that she has had episodes of chest pain like this at least since April from her records; however, she has refused stress test work up as she pretty adamantly does not want another stent placed.  She reaffirms this sentiment to me today in that she is willing to have a stress test, but no stent placement under any circumstances.  She states she is "81 years old" and she is "okay with not making it to 94, 95, or 89".  This seems to be the sentiment that she expressed previously.  Cousin is in room for this conversation.  She will need to be admitted to the hospital due to inability to walk.  Review of Systems: Systems reviewed.  As above, otherwise negative  Past Medical History  Diagnosis Date  . CARCINOID TUMOR 04/13/2007  . HYPERTENSION 04/13/2007  . ALLERGIC RHINITIS 04/13/2007  . GERD 04/13/2007  . HYPERGLYCEMIA 04/13/2007  . CHOLELITHIASIS 05/02/2010  . Arthritis   . Cancer   . Environmental allergies     has mucus in throat  . Diphtheria     age 66  . CAD (coronary artery disease)     NSTEMI.  LHC 09/03/11: pLAD 40-50%, pDx 80-90% (very small), lateral branch of OM 99%, m-dCFX 40%, oRCA 80%, m-dRCA 50-70%, EF 55%.  She had PCI with BMS to lateral branch of the OM.  RCA was tx medically.   . Hyponatremia   . Murmur     echo 11/12: EF 55-60%, mod calcified AV annulus, mild AI, mild MR  . Dermatophytosis of nail    Past Surgical History  Procedure Laterality Date  . Tonsillectomy      many years ago  . Cardiac surgery    .  Colon surgery      CANCER  . Small bowel repair    . Left heart catheterization with coronary angiogram N/A 09/03/2011    Procedure: LEFT HEART CATHETERIZATION WITH CORONARY ANGIOGRAM;  Surgeon: Peter M Martinique, MD;  Location: [facility] CATH LAB;  Service: Cardiovascular;  Laterality: N/A;  . Percutaneous coronary stent intervention (pci-s)  09/03/2011    Procedure: PERCUTANEOUS CORONARY STENT INTERVENTION (PCI-S);  Surgeon: Peter M Martinique, MD;  Location: [facility] CATH LAB;  Service: Cardiovascular;;   Social History:  reports that she has never smoked. She has never used smokeless tobacco. She reports that she does not drink alcohol or use illicit drugs.  Allergies  Allergen Reactions  . Ciprofloxacin     emesis  . Paxil [Paroxetine Hcl] Nausea And Vomiting    Family History  Problem Relation Age of Onset  . Hypertension Mother   . Uterine cancer Mother   . Ovarian cancer Mother   . Hypertension Father   . Stroke Father   . Hypertension Brother   . Hyperlipidemia Brother   . Heart attack Brother   . Lung cancer Sister      Prior to Admission medications   Medication Sig Start Date End Date Taking? Authorizing Provider  aspirin 81 MG tablet Take 81 mg by mouth daily.  Yes Historical Provider, MD  atorvastatin (LIPITOR) 80 MG tablet Take 80 mg by mouth once a week. Take on Mondays   Yes Historical Provider, MD  calcium-vitamin D (OSCAL WITH D) 500-200 MG-UNIT per tablet Take 1 tablet by mouth 2 (two) times daily.   Yes Historical Provider, MD  IRON PO Take 1 tablet by mouth daily.   Yes Historical Provider, MD  metoprolol tartrate (LOPRESSOR) 25 MG tablet Take 1 tablet (25 mg total) by mouth 2 (two) times daily. 02/24/15  Yes Marin Olp, MD  Multiple Vitamin (MULTI VITAMIN DAILY) TABS Take 1 tablet by mouth daily. Pt. Not sure of dose.   Yes Historical Provider, MD  nitroGLYCERIN (NITROSTAT) 0.4 MG SL tablet Place 1 tablet (0.4 mg total) under the tongue every 5 (five) minutes as needed  for chest pain. 02/24/15  Yes Marin Olp, MD  pantoprazole (PROTONIX) 40 MG tablet Take 1 tablet (40 mg total) by mouth daily. 02/24/15  Yes Marin Olp, MD  valsartan (DIOVAN) 160 MG tablet Take 1 tablet (160 mg total) by mouth daily. 03/10/15  Yes Marin Olp, MD   Physical Exam: Filed Vitals:   04/06/15 0100  BP: 182/78  Pulse: 79  Temp:   Resp: 21    BP 182/78 mmHg  Pulse 79  Temp(Src) 98.1 F (36.7 C) (Oral)  Resp 21  SpO2 97%  General Appearance:    Alert, oriented, no distress, appears stated age  Head:    Normocephalic, atraumatic  Eyes:    PERRL, EOMI, sclera non-icteric        Nose:   Nares without drainage or epistaxis. Mucosa, turbinates normal  Throat:   Moist mucous membranes. Oropharynx without erythema or exudate.  Neck:   Supple. No carotid bruits.  No thyromegaly.  No lymphadenopathy.   Back:     No CVA tenderness, no spinal tenderness  Lungs:     Clear to auscultation bilaterally, without wheezes, rhonchi or rales  Chest wall:    No tenderness to palpitation  Heart:    Regular rate and rhythm without murmurs, gallops, rubs  Abdomen:     Soft, non-tender, nondistended, normal bowel sounds, no organomegaly  Genitalia:    deferred  Rectal:    deferred  Extremities:   No clubbing, cyanosis or edema.  Pulses:   2+ and symmetric all extremities  Skin:   Skin color, texture, turgor normal, no rashes or lesions  Lymph nodes:   Cervical, supraclavicular, and axillary nodes normal  Neurologic:   CNII-XII intact. Normal strength, sensation and reflexes      throughout    Labs on Admission:  Basic Metabolic Panel:  Recent Labs Lab 04/05/15 2142  NA 125*  K 4.2  CL 92*  CO2 24  GLUCOSE 121*  BUN 19  CREATININE 1.18*  CALCIUM 9.1   Liver Function Tests:  Recent Labs Lab 04/05/15 2142  AST 31  ALT 35  ALKPHOS 102  BILITOT 1.1  PROT 6.7  ALBUMIN 3.3*   No results for input(s): LIPASE, AMYLASE in the last 168 hours. No results for  input(s): AMMONIA in the last 168 hours. CBC:  Recent Labs Lab 04/05/15 2142  WBC 5.3  NEUTROABS 3.6  HGB 16.3*  HCT 45.5  MCV 87.8  PLT 366   Cardiac Enzymes:  Recent Labs Lab 04/05/15 2142  TROPONINI <0.03    BNP (last 3 results)  Recent Labs  08/03/14 1643  PROBNP 181.0*   CBG: No results for  input(s): GLUCAP in the last 168 hours.  Radiological Exams on Admission: Dg Chest 2 View  04/05/2015   CLINICAL DATA:  Pain across the chest earlier today, relief with nitroglycerin. Weakness this evening. Difficulty ambulating. Secondhand smoke exposure.  EXAM: CHEST  2 VIEW  COMPARISON:  02/06/2014  FINDINGS: Normal heart size and pulmonary vascularity. No focal airspace disease or consolidation in the lungs. No blunting of costophrenic angles. No pneumothorax. Mediastinal contours appear intact. Calcified and tortuous aorta. Degenerative changes in the spine and shoulders.  IMPRESSION: No active cardiopulmonary disease.   Electronically Signed   By: Lucienne Capers M.D.   On: 04/05/2015 22:25   Ct Head Wo Contrast  04/05/2015   CLINICAL DATA:  Acute onset of difficulty moving legs. Initial encounter.  EXAM: CT HEAD WITHOUT CONTRAST  TECHNIQUE: Contiguous axial images were obtained from the base of the skull through the vertex without intravenous contrast.  COMPARISON:  CT of the head performed 02/06/2014  FINDINGS: There is no evidence of acute infarction, mass lesion, or intra- or extra-axial hemorrhage on CT.  Prominence of the ventricles and sulci reflects mild to moderate cortical volume loss. Scattered periventricular and subcortical white matter change likely reflects small vessel ischemic microangiopathy.  The brainstem and fourth ventricle are within normal limits. The basal ganglia are unremarkable in appearance. The cerebral hemispheres demonstrate grossly normal gray-white differentiation. No mass effect or midline shift is seen.  There is no evidence of fracture;  visualized osseous structures are unremarkable in appearance. The orbits are within normal limits. The paranasal sinuses and mastoid air cells are well-aerated. No significant soft tissue abnormalities are seen.  IMPRESSION: 1. No acute intracranial pathology seen on CT. 2. Mild to moderate cortical volume loss and scattered small vessel ischemic microangiopathy.   Electronically Signed   By: Garald Balding M.D.   On: 04/05/2015 22:53    EKG: Independently reviewed.  Assessment/Plan Principal Problem:   UTI (lower urinary tract infection) Active Problems:   Essential hypertension   DNR/DNI 07/21/14   Stable angina   Generalized weakness   1. UTI causing generalized weakness - 1. Rocephin 2. Cultures pending 3. PT/OT 2. Chest pain - see HPI, currently chest pain free 1. Stress test offered and serial trops considered; however, patient makes it clear that she doesn't want intervention even if needed to prevent a heart attack at this point.  Thus im not sure what the use of a stress test would be in this patient. 2. Her decision is not unreasonable given her advanced age 69. This is discussed with family member in room who is supportive of her decision. 3. HTN - continue home meds    Code Status: DNR  Family Communication: Cousin at bedside Disposition Plan: Admit to obs   Time spent: 70 min  GARDNER, JARED M. [facility] Pager [phone]  If 7AM-7PM, please contact the day team taking care of the patient [email or web address] Password [email or web address] 04/06/2015, 1:05 AM

## 2015-04-06 NOTE — Progress Notes (Signed)
   Follow Up Note  Pt admitted earlier this morning.  Seen after arrived to floor.  Feeling better although still feels very weak.   Exam: CV: Regular rate and rhythm, S1-S2 Lungs: Clear to auscultation bilaterally Abd: Soft, nontender, nondistended, positive bowel sounds Ext: No clubbing or cyanosis or edema  Present on Admission:  . UTI (lower urinary tract infection): Mild. Continue antibody. Awaiting cultures  . Stable angina: Stable  . Essential hypertension: Continue home meds  . Hyponatremia: Sodium on admission of 125. With hydration, repeat labs today now 130. Generalized weakness: Seen by PT and OT recommending short-term skilled nursing. Patient has concerns about this, however advised her that she really needs to consider this as an option given the fact that she does not have any family and she lives at home by herself said she will indeed consider this. We'll plan to hopefully get her to short-term skilled nursing tomorrow

## 2015-04-06 NOTE — ED Notes (Signed)
Admitting MD at bedside.

## 2015-04-06 NOTE — Evaluation (Signed)
Physical Therapy Evaluation Patient Details Name: SAMANVITHA GERMANY MRN: [id number] DOB: 01-04-1920 Today's Date: 04/06/2015   History of Present Illness  Becky Morales is a 81 y.o. female who presents to the ED with c/o dysuria, BLE weakness and inability to ambulate. Pt diagnosed with a UTI.   Clinical Impression  Patient demonstrates deficits in functional mobility as indicated below. Will need continued skilled PT to address deficits and maximize function. Will see as indicated and progress as tolerated.    Follow Up Recommendations SNF;Supervision/Assistance - 24 hour    Equipment Recommendations  Rolling walker with 5" wheels    Recommendations for Other Services       Precautions / Restrictions Precautions Precautions: Fall Restrictions Weight Bearing Restrictions: No      Mobility  Bed Mobility Overal bed mobility: Needs Assistance Bed Mobility: Supine to Sit     Supine to sit: Min guard     General bed mobility comments: Min guard for safety and max instructional cueing to scoot to EOB for BLE support  Transfers Overall transfer level: Needs assistance Equipment used: Rolling walker (2 wheeled) Transfers: Sit to/from Stand Sit to Stand: Mod assist         General transfer comment: Mod assist for lift/lower from EOB. Max cueing for sequencing, hand placement, and safety  Ambulation/Gait Ambulation/Gait assistance: Mod assist Ambulation Distance (Feet): 12 Feet Assistive device: Rolling walker (2 wheeled) Gait Pattern/deviations: Step-to pattern;Decreased stride length;Leaning posteriorly;Drifts right/left Gait velocity: decreased   General Gait Details: patient withinstability during minimal ambulation, assist for balance and cues for posture and stride  Stairs            Wheelchair Mobility    Modified Rankin (Stroke Patients Only)       Balance Overall balance assessment: Needs assistance Sitting-balance support: No upper extremity  supported Sitting balance-Leahy Scale: Poor     Standing balance support: Bilateral upper extremity supported Standing balance-Leahy Scale: Poor                               Pertinent Vitals/Pain Pain Assessment: No/denies pain    Home Living Family/patient expects to be discharged to:: Private residence Living Arrangements: Alone   Type of Home: House Home Access: Stairs to enter Entrance Stairs-Rails: Left;Can reach Advertising account executive of Steps: 3 Home Layout: One level Home Equipment: Marine scientist - single point      Prior Function Level of Independence: Independent               Hand Dominance        Extremity/Trunk Assessment   Upper Extremity Assessment: Overall WFL for tasks assessed           Lower Extremity Assessment: Generalized weakness      Cervical / Trunk Assessment: Kyphotic  Communication   Communication: No difficulties  Cognition Arousal/Alertness: Awake/alert Behavior During Therapy: WFL for tasks assessed/performed Overall Cognitive Status: Within Functional Limits for tasks assessed                      General Comments General comments (skin integrity, edema, etc.): patient in continent of urine during sessionx2, assisted with hygiene and pericare. Patient able to perform some aspects of care with assistance for balance    Exercises        Assessment/Plan    PT Assessment Patient needs continued PT services  PT Diagnosis Difficulty walking;Abnormality of gait;Generalized weakness;Acute pain  PT Problem List Decreased strength;Decreased range of motion;Decreased activity tolerance;Decreased balance;Decreased mobility;Decreased coordination;Decreased cognition;Decreased knowledge of use of DME;Decreased safety awareness  PT Treatment Interventions DME instruction;Gait training;Stair training;Functional mobility training;Therapeutic activities;Therapeutic exercise;Balance  training;Patient/family education   PT Goals (Current goals can be found in the Care Plan section) Acute Rehab PT Goals Patient Stated Goal: get better PT Goal Formulation: With patient Time For Goal Achievement: 04/20/15 Potential to Achieve Goals: Good    Frequency Min 3X/week   Barriers to discharge Decreased caregiver support      Co-evaluation               End of Session Equipment Utilized During Treatment: Gait belt Activity Tolerance: Patient tolerated treatment well Patient left: in chair;with call bell/phone within reach Nurse Communication: Mobility status         Time: 1130-1156 PT Time Calculation (min) (ACUTE ONLY): 26 min   Charges:   PT Evaluation $Initial PT Evaluation Tier I: 1 Procedure     PT G CodesDuncan Dull Apr 25, 2015, 2:57 PM Alben Deeds, [place] DPT  [phone]

## 2015-04-06 NOTE — Progress Notes (Signed)
Initial Nutrition Assessment  DOCUMENTATION CODES:  Not applicable  INTERVENTION:  Ensure Enlive po BID, each supplement provides 350 kcal and 20 grams of protein  NUTRITION DIAGNOSIS:  Inadequate oral intake related to  (decreased appetite) as evidenced by per patient/family report.  GOAL:  Patient will meet greater than or equal to 90% of their needs  MONITOR:  PO intake, Supplement acceptance, Labs, Weight trends, I & O's, Skin  REASON FOR ASSESSMENT:  Malnutrition Screening Tool    ASSESSMENT: Becky Morales is a 81 y.o. female who presents to the ED with c/o dysuria, BLE weakness and inability to ambulate onset this evening. There was also an episode of substernal, non-radiating chest pain this evening that resolved with NTG at home. It appears that she has had episodes of chest pain like this at least since April from her records; however, she has refused stress test work up as she pretty adamantly does not want another stent placed. She reaffirms this sentiment to me today in that she is willing to have a stress test, but no stent placement under any circumstances. She states she is "81 years old" and she is "okay with not making it to 89, 89, or 89". This seems to be the sentiment that she expressed previously.   Pt reports she has lost approximately 40# over the past 4 years due to shingles. She reveals UBW is around 135# over the past year, which is confirmed by wt hx.   She consumes 3 meals per day- Breakfast consists of 2 boiled eggs, bacon, and grits. Lunch and dinner meals vary, but pt reports she supplements meals with Boost supplements twice a day, as she tends to eat the most at breakfast. Pt is sipping on Ensure supplement upon visit and is amenable to continue supplement. She reports she ate very little this morning at breakfast because she had a large meal at dinner time.   Pt reports she is very active at baseline, but activity has decreased progressively due  to older age and health complications. She still lives by herself and attends events at the senior center twice a week.   Nutrition-Focused physical exam completed. Findings are mild fat depletion, mild muscle depletion, and no edema. Suspect depletion is due to advanced age and decreased activity level.   Height:  Ht Readings from Last 1 Encounters:  04/06/15 5' 7.5" (1.715 m)    Weight:  Wt Readings from Last 1 Encounters:  04/06/15 134 lb (60.782 kg)    Ideal Body Weight:  62.7 kg  Wt Readings from Last 10 Encounters:  04/06/15 134 lb (60.782 kg)  03/10/15 132 lb (59.875 kg)  02/24/15 132 lb (59.875 kg)  02/17/15 133 lb 3.2 oz (60.419 kg)  02/17/15 135 lb (61.236 kg)  12/27/14 130 lb (58.968 kg)  09/28/14 134 lb (60.782 kg)  09/08/14 130 lb 6.4 oz (59.149 kg)  08/24/14 133 lb (60.328 kg)  08/03/14 134 lb (60.782 kg)    BMI:  Body mass index is 20.67 kg/(m^2).  Estimated Nutritional Needs:  Kcal:  1400-1600  Protein:  65-75 grams  Fluid:  1.4-1.6 L  Skin:  Reviewed, no issues  Diet Order:  Diet Heart Room service appropriate?: Yes; Fluid consistency:: Thin  EDUCATION NEEDS:  Education needs addressed   Intake/Output Summary (Last 24 hours) at 04/06/15 1149 Last data filed at 04/06/15 0158  Gross per 24 hour  Intake    500 ml  Output      0 ml  Net    500 ml    Last BM:  04/05/15  Deshanda Molitor A. Jimmye Norman, RD, LDN, CDE Pager: [phone] After hours Pager: [phone]

## 2015-04-06 NOTE — ED Notes (Signed)
MD at bedside. 

## 2015-04-06 NOTE — Clinical Social Work Note (Signed)
Clinical Social Work Assessment  Patient Details  Name: Becky Morales MRN: [id number] Date of Birth: Oct 09, 1920  Date of referral:  04/06/15               Reason for consult:  Facility Placement                Permission sought to share information with:  Facility Art therapist granted to share information::  No  Name::        Agency::     Relationship::     Contact Information:     Housing/Transportation Living arrangements for the past 2 months:  [place] of Information:  Patient Patient Interpreter Needed:  None Criminal Activity/Legal Involvement Pertinent to Current Situation/Hospitalization:  No - Comment as needed Significant Relationships:  Friend Lives with:  Self Do you feel safe going back to the place where you live?  Yes Need for family participation in patient care:  No (Coment)  Care giving concerns:  Pt lives at home alone and the doctor reports that she has no family support   Facilities manager / plan:  [name] spoke with pt to discuss PT/OT recommendation for SNF  Employment status:  Retired Forensic scientist:    PT Recommendations:  [place] / Referral to community resources:  [place]  Patient/Family's Response to care:  Patient is not agreeable to short term placement and states that she will not be placed in a facility but that she will return home and get around however she can  Patient/Family's Understanding of and Emotional Response to Diagnosis, Current Treatment, and Prognosis:  unclear  Emotional Assessment Appearance:  Appears stated age Attitude/Demeanor/Rapport:    Affect (typically observed):  Agitated, Blunt, Appropriate Orientation:  Oriented to Self, Oriented to Place, Oriented to  Time, Oriented to Situation Alcohol / Substance use:  Not Applicable Psych involvement (Current and /or in the community):  No (Comment)  Discharge Needs  Concerns to  be addressed:  Home Safety Concerns Readmission within the last 30 days:  No Current discharge risk:  Physical Impairment, Lack of support system Barriers to Discharge:  Continued Medical Work up   Cranford Mon, LCSW 04/06/2015, 4:00 PM

## 2015-04-06 NOTE — Progress Notes (Signed)
Occupational Therapy Evaluation Patient Details Name: WYNN KERNES MRN: [id number] DOB: June 25, 1920 Today's Date: 04/06/2015    History of Present Illness POLINA BURMASTER is a 81 y.o. female who presents to the ED with c/o dysuria, BLE weakness and inability to ambulate. Pt diagnosed with a UTI.    Clinical Impression   Patient presenting with deconditioning, posterior lean, decreased safety awareness post UTI diagnosis.  Patient mod I PTA. Patient currently requires up to mod physical assistance and max cueing for safety & sequencing. Patient will benefit from acute OT to increase overall independence in the areas of ADLs, functional mobility, and overall safety in order to safely discharge to venue listed below. Patient does not have 24/7 supervision/assistance post acute d/c, therefore recommending ST SNF for safety and rehab due to generalized weakness.     Follow Up Recommendations  SNF;Supervision/Assistance - 24 hour    Equipment Recommendations  Other (comment) (TBD next venue of care)    Recommendations for Other Services  None at this time   Precautions / Restrictions Precautions Precautions: Fall Restrictions Weight Bearing Restrictions: No    Mobility Bed Mobility Overal bed mobility: Needs Assistance Bed Mobility: Supine to Sit     Supine to sit: Min guard     General bed mobility comments: Min guard for safety and max instructional cueing to scoot to EOB for BLE support  Transfers Overall transfer level: Needs assistance Equipment used: Rolling walker (2 wheeled) Transfers: Sit to/from Stand Sit to Stand: Mod assist General transfer comment: Mod assist for lift/lower from EOB. Max cueing for sequencing, hand placement, and safety    Balance Overall balance assessment: Needs assistance Sitting-balance support: No upper extremity supported;Feet supported Sitting balance-Leahy Scale: Poor     Standing balance support: Bilateral upper extremity supported;During  functional activity Standing balance-Leahy Scale: Poor    ADL Overall ADL's : Needs assistance/impaired Eating/Feeding: Set up;Bed level   Grooming: Minimal assistance;Sitting   Upper Body Bathing: Min guard;Sitting   Lower Body Bathing: Moderate assistance;Sit to/from stand;Cueing for safety   Upper Body Dressing : Sitting;Min guard   Lower Body Dressing: Moderate assistance;Cueing for safety;Sit to/from stand General ADL Comments: Patient unable to reach or cross BLEs for LB ADLs, pt lost balance posteriorly when trying to doff socks. Patient with posterior lean during sit <>stand transfers and functional ambulation.      Vision Additional Comments: no change from baseline          Pertinent Vitals/Pain Pain Assessment: No/denies pain     Hand Dominance     Extremity/Trunk Assessment Upper Extremity Assessment Upper Extremity Assessment: Overall WFL for tasks assessed   Lower Extremity Assessment Lower Extremity Assessment: Defer to PT evaluation   Cervical / Trunk Assessment Cervical / Trunk Assessment: Kyphotic   Communication Communication Communication: No difficulties   Cognition Arousal/Alertness: Awake/alert Behavior During Therapy: WFL for tasks assessed/performed Overall Cognitive Status: Within Functional Limits for tasks assessed              Home Living Family/patient expects to be discharged to:: Private residence Living Arrangements: Alone   Type of Home: House Home Access: Stairs to enter Technical brewer of Steps: 3 Entrance Stairs-Rails: Left;Can reach both;Right Home Layout: One level     Bathroom Shower/Tub: Corporate investment banker: Standard     Home Equipment: Marine scientist - single point    Prior Functioning/Environment Level of Independence: Independent     OT Diagnosis: Generalized weakness   OT Problem List:  Decreased strength;Decreased activity tolerance;Impaired balance (sitting and/or  standing);Decreased safety awareness;Decreased knowledge of use of DME or AE;Decreased knowledge of precautions   OT Treatment/Interventions: Self-care/ADL training;Therapeutic exercise;Energy conservation;DME and/or AE instruction;Therapeutic activities;Patient/family education;Balance training    OT Goals(Current goals can be found in the care plan section) Acute Rehab OT Goals Patient Stated Goal: get better OT Goal Formulation: With patient Time For Goal Achievement: 04/13/15 Potential to Achieve Goals: Good ADL Goals Pt Will Perform Grooming: with min guard assist;standing Pt Will Perform Upper Body Bathing: with set-up;sitting Pt Will Perform Lower Body Bathing: with min assist;sit to/from stand Pt Will Perform Upper Body Dressing: sitting;with set-up Pt Will Perform Lower Body Dressing: with min assist;sit to/from stand Pt Will Transfer to Toilet: with min guard assist;bedside commode;ambulating Pt Will Perform Toileting - Clothing Manipulation and hygiene: with min guard assist;sit to/from stand  OT Frequency: Min 2X/week   Barriers to D/C: Decreased caregiver support   End of Session Equipment Utilized During Treatment: Rolling walker  Activity Tolerance: Patient tolerated treatment well Patient left: in chair;with call bell/phone within reach   Time: 1130-1153 OT Time Calculation (min): 23 min Charges:  OT General Charges $OT Visit: 1 Procedure OT Evaluation $Initial OT Evaluation Tier I: 1 Procedure G-Codes: OT G-codes **NOT FOR INPATIENT CLASS** Functional Limitation: Self care Self Care Current Status (H6314): At least 20 percent but less than 40 percent impaired, limited or restricted Self Care Goal Status (H7026): At least 1 percent but less than 20 percent impaired, limited or restricted  Jamile Rekowski , MS, OTR/L, CLT Pager: [phone]  04/06/2015, 12:59 PM

## 2015-04-07 DIAGNOSIS — I5032 Chronic diastolic (congestive) heart failure: Secondary | ICD-10-CM | POA: Diagnosis present

## 2015-04-07 LAB — BRAIN NATRIURETIC PEPTIDE: B Natriuretic Peptide: 306 pg/mL — ABNORMAL HIGH (ref 0.0–100.0)

## 2015-04-07 MED ORDER — ENSURE ENLIVE PO LIQD: 237.0000 mL | Freq: Two times a day (BID) | ORAL | Status: DC

## 2015-04-07 MED ORDER — CEFUROXIME AXETIL 500 MG PO TABS: 500.0000 mg | ORAL_TABLET | Freq: Two times a day (BID) | ORAL | Status: DC

## 2015-04-07 MED ORDER — FUROSEMIDE 40 MG PO TABS
40.0000 mg | ORAL_TABLET | ORAL | Status: AC
  Administered 2015-04-07: 40 mg via ORAL
  Filled 2015-04-07: qty 1

## 2015-04-07 NOTE — Care Management Note (Signed)
Case Management Note  Patient Details  Name: Becky Morales MRN: [id number] Date of Birth: 1920-03-11  Subjective/Objective:   Pt admitted on 04/05/15 with UTI, dehydration and weakness.  PTA, pt resided at home alone, and was fairly independent.  PT/OT recommending SNF for rehab, though pt is refusing placement.               Action/Plan: Met with pt to discuss home arrangements:  She is agreeable to max [facility] services at dc; referral to [facility],per pt choice.  States has no DME at home; thinks she may need a RW and 3 in 1 for home use.  Orders for DME obtained; referral to [facility] for DME needs.  Start of care for [facility] care 24-48h post dc date.  DME to be delivered to pt's room prior to dc.    Expected Discharge Date:    04/07/15.                Expected Discharge Plan:  [place]  In-House Referral:  Clinical Social Work  Discharge planning Services  CM Consult  Post Acute Care Choice:  Home Health Choice offered to:  Patient  DME Arranged:  3-N-1, Walker rolling DME Agency:  [place]:  RN, PT, OT, Nurse's Aide [place] Agency:  [place]  Status of Service:  Completed, signed off  Medicare Important Message Given:  N/A - LOS <3 / Initial given by admissions Date Medicare IM Given:    Medicare IM give by:    Date Additional Medicare IM Given:    Additional Medicare Important Message give by:     If discussed at [place] of Stay Meetings, dates discussed:    Additional Comments:  [name] added to [facility] orders should pt need SNF placement from home.    Reinaldo Raddle, RN, BSN  Trauma/Neuro ICU Case Manager [phone]

## 2015-04-07 NOTE — Discharge Planning (Signed)
AVS  Reviewed with patient and her cousin who assists with her care. Rx was sent to her pharmacy per MD. Gilford Rile and 3n1 sent with pt.  They verbalize understanding of AVS.  Dc'd per w/c to private car home with all personal belongings acomp. By family member at 83.

## 2015-04-07 NOTE — Discharge Summary (Signed)
Discharge Summary  Becky Morales ERD:[id number] DOB: 02/21/1920  PCP: Garret Reddish, MD  Admit date: 04/05/2015 Discharge date: 04/07/2015  Time spent: 25 minutes  Recommendations for Outpatient Follow-up:  1.  Patient will follow-up with her PCP, Dr. Yong Channel in 2-3 weeks 2. New medications: Ceftin 500 by mouth twice a day 2 days 3. Ensure supplement twice a day added to regimen  Discharge Diagnoses:  Active Hospital Problems   Diagnosis Date Noted  . UTI (lower urinary tract infection) 04/06/2015  . Stable angina 04/06/2015  . Generalized weakness 04/06/2015  . Hyponatremia 04/06/2015  . DNR/DNI 07/21/14 07/21/2014  . Essential hypertension 04/13/2007    Resolved Hospital Problems   Diagnosis Date Noted Date Resolved  No resolved problems to display.    Discharge Condition: Improved, being discharged home  Diet recommendation: Low-sodium  Filed Weights   04/06/15 0232  Weight: 60.782 kg (134 lb)    History of present illness:  81 year old female with past medical history of essential hypertension, diastolic heart failure and CAD who lives at home alone and is normally quite active, presented to the emergency room on the evening of 6/15 with complaints of nonspecific chest pain as well as overall lotion when he weakness and difficulty walking. CT scan head negative. Patient found to have UTI and sodium of 125. Initial enzymes negative. Patient did not want any kind of cardiac intervention done so was felt amenable that with enzymes negative not to pursue this further. Brought into the hospitalist service.  Hospital Course:  Principal Problem:   UTI (lower urinary tract infection): Urine culture still pending. Mild. Received 2 days of IV Rocephin and then changed over to by mouth Ceftin for 2 more days. Active Problems:   Essential hypertension: Stable, continued on home regimen    Stable angina:: Stable. No further issues    Generalized weakness: No focal neurological  deficits. Evaluated by PT and OT who recommended short-term skilled nursing which patient flatly declined. Patient does not have any family other than one cousin. She was amenable to home health PT and OT which we have set up along with an aide and social work.  Chronic diastolic heart failure: BNP checked on day of discharge and in the 300s. Patient given one dose of by mouth Lasix prior to discharge given IV fluids and antibiotics during admission    Hyponatremia: Felt to be secondary to dehydration. By hospital day 2 had increased to 130   Procedures:  None  Consultations:  None  Discharge Exam: BP 176/53 mmHg  Pulse 60  Temp(Src) 97.7 F (36.5 C) (Oral)  Resp 18  Ht 5' 7.5" (1.715 m)  Wt 60.782 kg (134 lb)  BMI 20.67 kg/m2  SpO2 95%  General: Alert and oriented 2, no acute distress Cardiovascular: Regular rate and rhythm, H6-D1, 2/6 systolic ejection murmur Respiratory: Clear to auscultation bilaterally  Discharge Instructions You were cared for by a hospitalist during your hospital stay. If you have any questions about your discharge medications or the care you received while you were in the hospital after you are discharged, you can call the unit and asked to speak with the hospitalist on call if the hospitalist that took care of you is not available. Once you are discharged, your primary care physician will handle any further medical issues. Please note that NO REFILLS for any discharge medications will be authorized once you are discharged, as it is imperative that you return to your primary care physician (or establish a relationship  with a primary care physician if you do not have one) for your aftercare needs so that they can reassess your need for medications and monitor your lab values.  Discharge Instructions    Diet - low sodium heart healthy    Complete by:  As directed      Increase activity slowly    Complete by:  As directed             Medication List     TAKE these medications        aspirin 81 MG tablet  Take 81 mg by mouth daily.     atorvastatin 80 MG tablet  Commonly known as:  LIPITOR  Take 80 mg by mouth once a week. Take on Mondays     calcium-vitamin D 500-200 MG-UNIT per tablet  Commonly known as:  OSCAL WITH D  Take 1 tablet by mouth 2 (two) times daily.     cefUROXime 500 MG tablet  Commonly known as:  CEFTIN  Take 1 tablet (500 mg total) by mouth 2 (two) times daily with a meal.     feeding supplement (ENSURE ENLIVE) Liqd  Take 237 mLs by mouth 2 (two) times daily between meals.     IRON PO  Take 1 tablet by mouth daily.     metoprolol tartrate 25 MG tablet  Commonly known as:  LOPRESSOR  Take 1 tablet (25 mg total) by mouth 2 (two) times daily.     MULTI VITAMIN DAILY Tabs  Take 1 tablet by mouth daily. Pt. Not sure of dose.     nitroGLYCERIN 0.4 MG SL tablet  Commonly known as:  NITROSTAT  Place 1 tablet (0.4 mg total) under the tongue every 5 (five) minutes as needed for chest pain.     pantoprazole 40 MG tablet  Commonly known as:  PROTONIX  Take 1 tablet (40 mg total) by mouth daily.     valsartan 160 MG tablet  Commonly known as:  DIOVAN  Take 1 tablet (160 mg total) by mouth daily.       Allergies  Allergen Reactions  . Ciprofloxacin     emesis  . Paxil [Paroxetine Hcl] Nausea And Vomiting      The results of significant diagnostics from this hospitalization (including imaging, microbiology, ancillary and laboratory) are listed below for reference.    Significant Diagnostic Studies: Dg Chest 2 View  04/05/2015   CLINICAL DATA:  Pain across the chest earlier today, relief with nitroglycerin. Weakness this evening. Difficulty ambulating. Secondhand smoke exposure.  EXAM: CHEST  2 VIEW  COMPARISON:  02/06/2014  FINDINGS: Normal heart size and pulmonary vascularity. No focal airspace disease or consolidation in the lungs. No blunting of costophrenic angles. No pneumothorax. Mediastinal  contours appear intact. Calcified and tortuous aorta. Degenerative changes in the spine and shoulders.  IMPRESSION: No active cardiopulmonary disease.   Electronically Signed   By: Lucienne Capers M.D.   On: 04/05/2015 22:25   Ct Head Wo Contrast  04/05/2015   CLINICAL DATA:  Acute onset of difficulty moving legs. Initial encounter.  EXAM: CT HEAD WITHOUT CONTRAST  TECHNIQUE: Contiguous axial images were obtained from the base of the skull through the vertex without intravenous contrast.  COMPARISON:  CT of the head performed 02/06/2014  FINDINGS: There is no evidence of acute infarction, mass lesion, or intra- or extra-axial hemorrhage on CT.  Prominence of the ventricles and sulci reflects mild to moderate cortical volume loss. Scattered periventricular and subcortical white  matter change likely reflects small vessel ischemic microangiopathy.  The brainstem and fourth ventricle are within normal limits. The basal ganglia are unremarkable in appearance. The cerebral hemispheres demonstrate grossly normal gray-white differentiation. No mass effect or midline shift is seen.  There is no evidence of fracture; visualized osseous structures are unremarkable in appearance. The orbits are within normal limits. The paranasal sinuses and mastoid air cells are well-aerated. No significant soft tissue abnormalities are seen.  IMPRESSION: 1. No acute intracranial pathology seen on CT. 2. Mild to moderate cortical volume loss and scattered small vessel ischemic microangiopathy.   Electronically Signed   By: Garald Balding M.D.   On: 04/05/2015 22:53    Microbiology: Recent Results (from the past 240 hour(s))  Urine culture     Status: None (Preliminary result)   Collection Time: 04/06/15 12:10 AM  Result Value Ref Range Status   Specimen Description URINE, RANDOM  Final   Special Requests NONE  Final   Culture CULTURE REINCUBATED FOR BETTER GROWTH  Final   Report Status PENDING  Incomplete     Labs: Basic  Metabolic Panel:  Recent Labs Lab 04/05/15 2142 04/06/15 1050  NA 125* 130*  K 4.2 4.0  CL 92* 96*  CO2 24 25  GLUCOSE 121* 119*  BUN 19 16  CREATININE 1.18* 0.97  CALCIUM 9.1 8.8*   Liver Function Tests:  Recent Labs Lab 04/05/15 2142  AST 31  ALT 35  ALKPHOS 102  BILITOT 1.1  PROT 6.7  ALBUMIN 3.3*   No results for input(s): LIPASE, AMYLASE in the last 168 hours. No results for input(s): AMMONIA in the last 168 hours. CBC:  Recent Labs Lab 04/05/15 2142  WBC 5.3  NEUTROABS 3.6  HGB 16.3*  HCT 45.5  MCV 87.8  PLT 366   Cardiac Enzymes:  Recent Labs Lab 04/05/15 2142  TROPONINI <0.03   BNP: BNP (last 3 results)  Recent Labs  04/07/15 1130  BNP 306.0*    ProBNP (last 3 results)  Recent Labs  08/03/14 1643  PROBNP 181.0*    CBG: No results for input(s): GLUCAP in the last 168 hours.     Signed:  Annita Brod  [facility] 04/07/2015, 2:00 PM

## 2015-04-08 LAB — URINE CULTURE: Culture: >=100000

## 2015-04-10 ENCOUNTER — Ambulatory Visit: Payer: Medicare Other | Admitting: Physician Assistant

## 2015-04-25 DIAGNOSIS — I509 Heart failure, unspecified: Secondary | ICD-10-CM | POA: Diagnosis not present

## 2015-04-25 DIAGNOSIS — K219 Gastro-esophageal reflux disease without esophagitis: Secondary | ICD-10-CM

## 2015-04-25 DIAGNOSIS — E871 Hypo-osmolality and hyponatremia: Secondary | ICD-10-CM | POA: Diagnosis not present

## 2015-04-25 DIAGNOSIS — I209 Angina pectoris, unspecified: Secondary | ICD-10-CM | POA: Diagnosis not present

## 2015-04-25 DIAGNOSIS — I10 Essential (primary) hypertension: Secondary | ICD-10-CM | POA: Diagnosis not present

## 2015-04-26 ENCOUNTER — Encounter: Payer: Self-pay | Admitting: Podiatry

## 2015-04-26 ENCOUNTER — Ambulatory Visit (INDEPENDENT_AMBULATORY_CARE_PROVIDER_SITE_OTHER): Payer: Medicare Other | Admitting: Podiatry

## 2015-04-26 DIAGNOSIS — M79673 Pain in unspecified foot: Secondary | ICD-10-CM

## 2015-04-26 DIAGNOSIS — M79672 Pain in left foot: Secondary | ICD-10-CM

## 2015-04-26 DIAGNOSIS — L852 Keratosis punctata (palmaris et plantaris): Secondary | ICD-10-CM

## 2015-04-26 DIAGNOSIS — B351 Tinea unguium: Secondary | ICD-10-CM

## 2015-04-26 DIAGNOSIS — L84 Corns and callosities: Secondary | ICD-10-CM

## 2015-04-26 NOTE — Progress Notes (Signed)
Subjective:     Patient ID: Becky Morales, female   DOB: [birth year], 81 y.o.   MRN: [id number]  HPI patient presents with painful nailbeds 1-5 both feet and a painful lesion on the bottom of the left foot which is been very tender when pressed and making it hard to walk. Patient does not remember specific injury   Review of Systems     Objective:   Physical Exam Neurovascular status intact muscle strength adequate range of motion within normal limits with keratotic lesion sub-third metatarsal left it's very painful when pressed and thick yellow brittle nailbeds 1-5 both feet    Assessment:     Mycotic nail infection with pain 1-5 both feet and lesion formation sub-third metatarsal left painful when pressed    Plan:     Debris painful nailbeds 1-5 both feet and lesion left foot with no iatrogenic bleeding noted

## 2015-04-27 ENCOUNTER — Ambulatory Visit (INDEPENDENT_AMBULATORY_CARE_PROVIDER_SITE_OTHER): Payer: Medicare Other | Admitting: Family Medicine

## 2015-04-27 ENCOUNTER — Encounter: Payer: Self-pay | Admitting: Family Medicine

## 2015-04-27 VITALS — BP 130/82 | HR 78 | Temp 98.1°F | Wt 139.0 lb

## 2015-04-27 DIAGNOSIS — R262 Difficulty in walking, not elsewhere classified: Secondary | ICD-10-CM

## 2015-04-27 DIAGNOSIS — N3000 Acute cystitis without hematuria: Secondary | ICD-10-CM | POA: Diagnosis not present

## 2015-04-27 DIAGNOSIS — I25811 Atherosclerosis of native coronary artery of transplanted heart without angina pectoris: Secondary | ICD-10-CM

## 2015-04-27 DIAGNOSIS — I5032 Chronic diastolic (congestive) heart failure: Secondary | ICD-10-CM

## 2015-04-27 DIAGNOSIS — I25769 Atherosclerosis of bypass graft of coronary artery of transplanted heart with unspecified angina pectoris: Secondary | ICD-10-CM

## 2015-04-27 DIAGNOSIS — E871 Hypo-osmolality and hyponatremia: Secondary | ICD-10-CM

## 2015-04-27 NOTE — Assessment & Plan Note (Signed)
Chest pain has resolved. This is likely stable angina in patient with known CAD. About 2 months ago had elevated troponin and declined admission which may have been NSTEMI. She is still not sure if she would want any cardiac procedures done but wants to discuss with cardiology and has appointment 8/3.   Continue maximum medical management with BP control, statin as tolerated and ASA.

## 2015-04-27 NOTE — Progress Notes (Signed)
Garret Reddish, MD  Subjective:  Becky Morales is a 81 y.o. year old very pleasant female patient who presents with:  Hospital follow up for nonspecific chest pain, difficulty walking, UTI -Although presenting with chest pain, she declined further cardiac workup other than having cardiac enzymes which were negative. She was found to have a UTI and was treated with ceftin upon discharge. Grew strep viridans.  Had hyonatremia while hospitalized. Hyponatremia due to dehydration, improved to 130 before discharge  PT and OT advised SNF which patient declined. Has had it at home. She has been taking daily Ensure twice a day.   She finished ceftin and dysuria, polyuria resolved. No more chest pain or shortness of breath. Continues to have fatigue with walking or activity per what seems to be her new baseline. Walking has improved.   ROS- no fever, chills, cough, congestion  Past Medical History- CAD, HTN, hyperglycemia, HLD  Medications- reviewed and updated Current Outpatient Prescriptions  Medication Sig Dispense Refill  . aspirin 81 MG tablet Take 81 mg by mouth daily.     Marland Kitchen atorvastatin (LIPITOR) 80 MG tablet Take 80 mg by mouth once a week. Take on Mondays    . calcium-vitamin D (OSCAL WITH D) 500-200 MG-UNIT per tablet Take 1 tablet by mouth 2 (two) times daily.    . feeding supplement, ENSURE ENLIVE, (ENSURE ENLIVE) LIQD Take 237 mLs by mouth 2 (two) times daily between meals. 237 mL 12  . IRON PO Take 1 tablet by mouth daily.    . metoprolol tartrate (LOPRESSOR) 25 MG tablet Take 1 tablet (25 mg total) by mouth 2 (two) times daily. 180 tablet 3  . Multiple Vitamin (MULTI VITAMIN DAILY) TABS Take 1 tablet by mouth daily. Pt. Not sure of dose.    . pantoprazole (PROTONIX) 40 MG tablet Take 1 tablet (40 mg total) by mouth daily. 90 tablet 3  . valsartan (DIOVAN) 160 MG tablet Take 1 tablet (160 mg total) by mouth daily. 90 tablet 3  . cefUROXime (CEFTIN) 500 MG tablet Take 1 tablet (500 mg  total) by mouth 2 (two) times daily with a meal. 4 tablet 0  . nitroGLYCERIN (NITROSTAT) 0.4 MG SL tablet Place 1 tablet (0.4 mg total) under the tongue every 5 (five) minutes as needed for chest pain. (Patient not taking: Reported on 04/27/2015) 25 tablet 12  . [DISCONTINUED] potassium chloride (K-DUR,KLOR-CON) 10 MEQ tablet Take 1 tablet (10 mEq total) by mouth daily. 30 tablet 8   Objective: BP 130/82 mmHg  Pulse 78  Temp(Src) 98.1 F (36.7 C)  Wt 139 lb (63.05 kg) Gen: NAD, resting comfortably in chair CV: RRR no murmurs rubs or gallops Lungs: CTAB no crackles, wheeze, rhonchi Abdomen: soft/nontender/nondistended/normal bowel sounds. No rebound or guarding.  Ext: 1+ edema Skin: warm, dry, no rash Neuro: grossly normal, moves all extremities, walks with cane   Assessment/Plan:  Hospital follow up for nonspecific chest pain, difficulty walking, UTI CAD (coronary artery disease)  s/p  PCI for NSTEMI. with angina.  Chest pain has resolved. This is likely stable angina in patient with known CAD. About 2 months ago had elevated troponin and declined admission which may have been NSTEMI. She is still not sure if she would want any cardiac procedures done but wants to discuss with cardiology and has appointment 8/3.   Continue maximum medical management with BP control, statin as tolerated and ASA.    Continues to decline hospice   UTI has resolved  Walking has  improved. Advised patient to continue to use cane.   Did have hyponatremia in hospital which is stable but improved after hydration Since 2014 intermittent hyponatremia as low as 130. Avoid hctz  Return precautions advised.   3 months or prn planned

## 2015-04-27 NOTE — Patient Instructions (Signed)
Bring all medicine to your visits See me in 3 months or sooner if needed  Glad chest pain is gone You likely do not want any procedures but still want to see cardiology to discuss- you have an appointment next month  Glad urine symptoms are better  Have a great rest of the summer!

## 2015-04-28 ENCOUNTER — Telehealth: Payer: Self-pay

## 2015-04-28 NOTE — Telephone Encounter (Signed)
Patient would need to find out through her insurance what personal care services are covered. Oftentimes there is limited coverage and only provided for a few visits after an incident. Likely what she is looking for- she would need to pay for out of pocket unfortunately. Once she finds out what they cover, could schedule a visit as I believe this requires a face to face.

## 2015-04-28 NOTE — Telephone Encounter (Signed)
Pt is wanting you to write a note to [employer] stating that she needs an aide to come in and help her with her daily activities. Please advise

## 2015-05-04 ENCOUNTER — Telehealth: Payer: Self-pay | Admitting: Family Medicine

## 2015-05-04 NOTE — Telephone Encounter (Signed)
Katherine call from [place] to ask for verbal orders to see pt 2 TIMES A WEEK FOR 1 WEEK    [phone]

## 2015-05-05 NOTE — Telephone Encounter (Signed)
Left detailed message on machine approving verbal orders.

## 2015-05-05 NOTE — Telephone Encounter (Signed)
Left message on machine for patient to return our call 

## 2015-05-05 NOTE — Telephone Encounter (Signed)
May give verbal order. Hopeful we will be able to use last visit as face to face.

## 2015-05-22 ENCOUNTER — Ambulatory Visit: Payer: Medicare Other

## 2015-05-24 ENCOUNTER — Ambulatory Visit: Payer: Medicare Other | Admitting: Nurse Practitioner

## 2015-05-25 ENCOUNTER — Emergency Department (HOSPITAL_COMMUNITY): Payer: Medicare Other

## 2015-05-25 ENCOUNTER — Encounter (HOSPITAL_COMMUNITY): Payer: Self-pay | Admitting: Emergency Medicine

## 2015-05-25 ENCOUNTER — Inpatient Hospital Stay (HOSPITAL_COMMUNITY)
Admission: EM | Admit: 2015-05-25 | Discharge: 2015-06-03 | DRG: 689 | Disposition: A | Discharge status: Skilled Nursing Facility | Payer: Medicare Other | Attending: Internal Medicine | Admitting: Internal Medicine

## 2015-05-25 DIAGNOSIS — M419 Scoliosis, unspecified: Secondary | ICD-10-CM | POA: Diagnosis present

## 2015-05-25 DIAGNOSIS — C7A00 Malignant carcinoid tumor of unspecified site: Secondary | ICD-10-CM | POA: Diagnosis present

## 2015-05-25 DIAGNOSIS — G9340 Encephalopathy, unspecified: Secondary | ICD-10-CM | POA: Diagnosis present

## 2015-05-25 DIAGNOSIS — Z955 Presence of coronary angioplasty implant and graft: Secondary | ICD-10-CM | POA: Diagnosis not present

## 2015-05-25 DIAGNOSIS — M4806 Spinal stenosis, lumbar region: Secondary | ICD-10-CM | POA: Diagnosis present

## 2015-05-25 DIAGNOSIS — K219 Gastro-esophageal reflux disease without esophagitis: Secondary | ICD-10-CM | POA: Diagnosis present

## 2015-05-25 DIAGNOSIS — F411 Generalized anxiety disorder: Secondary | ICD-10-CM | POA: Diagnosis present

## 2015-05-25 DIAGNOSIS — A419 Sepsis, unspecified organism: Secondary | ICD-10-CM | POA: Diagnosis present

## 2015-05-25 DIAGNOSIS — E871 Hypo-osmolality and hyponatremia: Secondary | ICD-10-CM

## 2015-05-25 DIAGNOSIS — I10 Essential (primary) hypertension: Secondary | ICD-10-CM | POA: Diagnosis present

## 2015-05-25 DIAGNOSIS — I25811 Atherosclerosis of native coronary artery of transplanted heart without angina pectoris: Secondary | ICD-10-CM

## 2015-05-25 DIAGNOSIS — Z7982 Long term (current) use of aspirin: Secondary | ICD-10-CM | POA: Diagnosis not present

## 2015-05-25 DIAGNOSIS — J309 Allergic rhinitis, unspecified: Secondary | ICD-10-CM | POA: Diagnosis present

## 2015-05-25 DIAGNOSIS — Z85038 Personal history of other malignant neoplasm of large intestine: Secondary | ICD-10-CM

## 2015-05-25 DIAGNOSIS — I2510 Atherosclerotic heart disease of native coronary artery without angina pectoris: Secondary | ICD-10-CM | POA: Diagnosis present

## 2015-05-25 DIAGNOSIS — I252 Old myocardial infarction: Secondary | ICD-10-CM

## 2015-05-25 DIAGNOSIS — K8020 Calculus of gallbladder without cholecystitis without obstruction: Secondary | ICD-10-CM | POA: Diagnosis present

## 2015-05-25 DIAGNOSIS — E872 Acidosis, unspecified: Secondary | ICD-10-CM | POA: Diagnosis present

## 2015-05-25 DIAGNOSIS — M549 Dorsalgia, unspecified: Secondary | ICD-10-CM | POA: Diagnosis present

## 2015-05-25 DIAGNOSIS — M1611 Unilateral primary osteoarthritis, right hip: Secondary | ICD-10-CM | POA: Diagnosis present

## 2015-05-25 DIAGNOSIS — I509 Heart failure, unspecified: Secondary | ICD-10-CM | POA: Diagnosis not present

## 2015-05-25 DIAGNOSIS — E222 Syndrome of inappropriate secretion of antidiuretic hormone: Secondary | ICD-10-CM | POA: Diagnosis present

## 2015-05-25 DIAGNOSIS — N390 Urinary tract infection, site not specified: Secondary | ICD-10-CM | POA: Diagnosis present

## 2015-05-25 DIAGNOSIS — M8580 Other specified disorders of bone density and structure, unspecified site: Secondary | ICD-10-CM | POA: Diagnosis present

## 2015-05-25 DIAGNOSIS — R55 Syncope and collapse: Secondary | ICD-10-CM | POA: Diagnosis present

## 2015-05-25 DIAGNOSIS — M25559 Pain in unspecified hip: Secondary | ICD-10-CM

## 2015-05-25 DIAGNOSIS — Z66 Do not resuscitate: Secondary | ICD-10-CM | POA: Diagnosis present

## 2015-05-25 DIAGNOSIS — R109 Unspecified abdominal pain: Secondary | ICD-10-CM

## 2015-05-25 DIAGNOSIS — E785 Hyperlipidemia, unspecified: Secondary | ICD-10-CM | POA: Diagnosis present

## 2015-05-25 DIAGNOSIS — T17908A Unspecified foreign body in respiratory tract, part unspecified causing other injury, initial encounter: Secondary | ICD-10-CM | POA: Diagnosis present

## 2015-05-25 DIAGNOSIS — M48061 Spinal stenosis, lumbar region without neurogenic claudication: Secondary | ICD-10-CM | POA: Diagnosis present

## 2015-05-25 DIAGNOSIS — I5032 Chronic diastolic (congestive) heart failure: Secondary | ICD-10-CM | POA: Diagnosis present

## 2015-05-25 DIAGNOSIS — Z515 Encounter for palliative care: Secondary | ICD-10-CM

## 2015-05-25 DIAGNOSIS — F068 Other specified mental disorders due to known physiological condition: Secondary | ICD-10-CM | POA: Diagnosis not present

## 2015-05-25 DIAGNOSIS — T17998A Other foreign object in respiratory tract, part unspecified causing other injury, initial encounter: Secondary | ICD-10-CM | POA: Diagnosis not present

## 2015-05-25 DIAGNOSIS — I25769 Atherosclerosis of bypass graft of coronary artery of transplanted heart with unspecified angina pectoris: Secondary | ICD-10-CM | POA: Diagnosis present

## 2015-05-25 DIAGNOSIS — R0602 Shortness of breath: Secondary | ICD-10-CM

## 2015-05-25 LAB — URINALYSIS, ROUTINE W REFLEX MICROSCOPIC
Bilirubin Urine: NEGATIVE
GLUCOSE, UA: NEGATIVE mg/dL
HGB URINE DIPSTICK: NEGATIVE
KETONES UR: NEGATIVE mg/dL
Nitrite: POSITIVE — AB
Protein, ur: NEGATIVE mg/dL
Specific Gravity, Urine: 1.008 (ref 1.005–1.030)
Urobilinogen, UA: 0.2 mg/dL (ref 0.0–1.0)
pH: 6.5 (ref 5.0–8.0)

## 2015-05-25 LAB — CBC WITH DIFFERENTIAL/PLATELET
BASOS PCT: 0 % (ref 0–1)
Basophils Absolute: 0 10*3/uL (ref 0.0–0.1)
EOS PCT: 1 % (ref 0–5)
Eosinophils Absolute: 0 10*3/uL (ref 0.0–0.7)
HEMATOCRIT: 48.8 % — AB (ref 36.0–46.0)
HEMOGLOBIN: 17.6 g/dL — AB (ref 12.0–15.0)
Lymphocytes Relative: 12 % (ref 12–46)
Lymphs Abs: 0.6 10*3/uL — ABNORMAL LOW (ref 0.7–4.0)
MCH: 31.8 pg (ref 26.0–34.0)
MCHC: 36.1 g/dL — ABNORMAL HIGH (ref 30.0–36.0)
MCV: 88.1 fL (ref 78.0–100.0)
MONO ABS: 0.6 10*3/uL (ref 0.1–1.0)
Monocytes Relative: 13 % — ABNORMAL HIGH (ref 3–12)
NEUTROS ABS: 3.4 10*3/uL (ref 1.7–7.7)
NEUTROS PCT: 74 % (ref 43–77)
PLATELETS: 375 10*3/uL (ref 150–400)
RBC: 5.54 MIL/uL — AB (ref 3.87–5.11)
RDW: 16.5 % — ABNORMAL HIGH (ref 11.5–15.5)
WBC: 4.6 10*3/uL (ref 4.0–10.5)

## 2015-05-25 LAB — URINE MICROSCOPIC-ADD ON

## 2015-05-25 LAB — COMPREHENSIVE METABOLIC PANEL
ALBUMIN: 3.2 g/dL — AB (ref 3.5–5.0)
ALK PHOS: 110 U/L (ref 38–126)
ALT: 18 U/L (ref 14–54)
AST: 24 U/L (ref 15–41)
Anion gap: 11 (ref 5–15)
BILIRUBIN TOTAL: 0.8 mg/dL (ref 0.3–1.2)
BUN: 20 mg/dL (ref 6–20)
CALCIUM: 9.2 mg/dL (ref 8.9–10.3)
CO2: 23 mmol/L (ref 22–32)
Chloride: 94 mmol/L — ABNORMAL LOW (ref 101–111)
Creatinine, Ser: 1.07 mg/dL — ABNORMAL HIGH (ref 0.44–1.00)
GFR calc non Af Amer: 43 mL/min — ABNORMAL LOW (ref >60)
GFR, EST AFRICAN AMERICAN: 50 mL/min — AB (ref >60)
GLUCOSE: 101 mg/dL — AB (ref 65–99)
Potassium: 4.3 mmol/L (ref 3.5–5.1)
Sodium: 128 mmol/L — ABNORMAL LOW (ref 135–145)
Total Protein: 7 g/dL (ref 6.5–8.1)

## 2015-05-25 LAB — PROTIME-INR
INR: 1.27 (ref 0.00–1.49)
Prothrombin Time: 16 s — ABNORMAL HIGH (ref 11.6–15.2)

## 2015-05-25 LAB — LIPASE, BLOOD: Lipase: 35 U/L (ref 22–51)

## 2015-05-25 LAB — APTT: aPTT: 35 s (ref 24–37)

## 2015-05-25 LAB — BRAIN NATRIURETIC PEPTIDE: B Natriuretic Peptide: 154.7 pg/mL — ABNORMAL HIGH (ref 0.0–100.0)

## 2015-05-25 MED ORDER — IRON 18 MG PO TBCR
1.0000 | EXTENDED_RELEASE_TABLET | Freq: Every day | ORAL | Status: DC
Start: 2015-05-26 — End: 2015-05-25

## 2015-05-25 MED ORDER — ALUM & MAG HYDROXIDE-SIMETH 200-200-20 MG/5ML PO SUSP: 30.0000 mL | Freq: Four times a day (QID) | ORAL | Status: DC | PRN

## 2015-05-25 MED ORDER — HYDRALAZINE HCL 20 MG/ML IJ SOLN
10.0000 mg | Freq: Once | INTRAMUSCULAR | Status: AC
  Administered 2015-05-25: 10 mg via INTRAVENOUS
  Filled 2015-05-25: qty 1

## 2015-05-25 MED ORDER — NITROGLYCERIN 0.4 MG SL SUBL: 0.4000 mg | SUBLINGUAL_TABLET | SUBLINGUAL | Status: DC | PRN

## 2015-05-25 MED ORDER — ONDANSETRON HCL 4 MG/2ML IJ SOLN
4.0000 mg | Freq: Three times a day (TID) | INTRAMUSCULAR | Status: DC | PRN
Start: 2015-05-25 — End: 2015-06-03
  Administered 2015-05-26 – 2015-05-29 (×3): 4 mg via INTRAVENOUS
  Filled 2015-05-25 (×3): qty 2

## 2015-05-25 MED ORDER — SODIUM CHLORIDE 0.9 % IV SOLN: INTRAVENOUS | Status: DC

## 2015-05-25 MED ORDER — FERROUS SULFATE 325 (65 FE) MG PO TABS
325.0000 mg | ORAL_TABLET | Freq: Every day | ORAL | Status: DC
  Administered 2015-05-26 – 2015-05-30 (×5): 325 mg via ORAL
  Filled 2015-05-25 (×7): qty 1

## 2015-05-25 MED ORDER — CEFTRIAXONE SODIUM 1 G IJ SOLR
1.0000 g | Freq: Once | INTRAMUSCULAR | Status: AC
  Administered 2015-05-25: 1 g via INTRAVENOUS
  Filled 2015-05-25: qty 10

## 2015-05-25 MED ORDER — MORPHINE SULFATE 2 MG/ML IJ SOLN: 1.0000 mg | INTRAMUSCULAR | Status: DC | PRN

## 2015-05-25 MED ORDER — SODIUM CHLORIDE 0.9 % IV BOLUS (SEPSIS)
500.0000 mL | Freq: Once | INTRAVENOUS | Status: AC
  Administered 2015-05-25: 500 mL via INTRAVENOUS

## 2015-05-25 MED ORDER — ACETAMINOPHEN 650 MG RE SUPP
650.0000 mg | Freq: Four times a day (QID) | RECTAL | Status: DC | PRN
Start: 2015-05-25 — End: 2015-06-03

## 2015-05-25 MED ORDER — ASPIRIN 81 MG PO TABS: 81.0000 mg | ORAL_TABLET | Freq: Every day | ORAL | Status: DC

## 2015-05-25 MED ORDER — SODIUM CHLORIDE 0.9 % IV SOLN
INTRAVENOUS | Status: DC
  Administered 2015-05-26 – 2015-05-28 (×4): via INTRAVENOUS

## 2015-05-25 MED ORDER — HEPARIN SODIUM (PORCINE) 5000 UNIT/ML IJ SOLN
5000.0000 [IU] | Freq: Three times a day (TID) | INTRAMUSCULAR | Status: DC
  Administered 2015-05-26 – 2015-06-03 (×23): 5000 [IU] via SUBCUTANEOUS
  Filled 2015-05-25 (×31): qty 1

## 2015-05-25 MED ORDER — DEXTROSE 5 % IV SOLN
1.0000 g | INTRAVENOUS | Status: AC
  Administered 2015-05-26 – 2015-05-31 (×5): 1 g via INTRAVENOUS
  Filled 2015-05-25 (×5): qty 10

## 2015-05-25 MED ORDER — LABETALOL HCL 5 MG/ML IV SOLN: 10.0000 mg | Freq: Once | INTRAVENOUS | Status: DC

## 2015-05-25 MED ORDER — HYDRALAZINE HCL 20 MG/ML IJ SOLN
5.0000 mg | INTRAMUSCULAR | Status: DC | PRN
  Administered 2015-05-26 – 2015-06-02 (×3): 5 mg via INTRAVENOUS
  Filled 2015-05-25 (×4): qty 1

## 2015-05-25 MED ORDER — METOPROLOL TARTRATE 25 MG PO TABS
25.0000 mg | ORAL_TABLET | Freq: Two times a day (BID) | ORAL | Status: DC
  Administered 2015-05-26 – 2015-06-03 (×18): 25 mg via ORAL
  Filled 2015-05-25 (×19): qty 1

## 2015-05-25 MED ORDER — IRBESARTAN 150 MG PO TABS
150.0000 mg | ORAL_TABLET | Freq: Every day | ORAL | Status: DC
  Administered 2015-05-26 – 2015-06-03 (×9): 150 mg via ORAL
  Filled 2015-05-25 (×11): qty 1

## 2015-05-25 MED ORDER — ADULT MULTIVITAMIN W/MINERALS CH
1.0000 | ORAL_TABLET | Freq: Every day | ORAL | Status: DC
  Administered 2015-05-26 – 2015-06-03 (×9): 1 via ORAL
  Filled 2015-05-25 (×9): qty 1

## 2015-05-25 MED ORDER — HYDROCODONE-ACETAMINOPHEN 5-325 MG PO TABS
1.0000 | ORAL_TABLET | Freq: Four times a day (QID) | ORAL | Status: DC | PRN
  Administered 2015-05-28 – 2015-05-31 (×7): 1 via ORAL
  Filled 2015-05-25 (×7): qty 1

## 2015-05-25 MED ORDER — LABETALOL HCL 5 MG/ML IV SOLN: 5.0000 mg | Freq: Once | INTRAVENOUS | Status: DC

## 2015-05-25 MED ORDER — SODIUM CHLORIDE 0.9 % IJ SOLN
3.0000 mL | Freq: Two times a day (BID) | INTRAMUSCULAR | Status: DC
  Administered 2015-05-26 – 2015-06-03 (×16): 3 mL via INTRAVENOUS

## 2015-05-25 MED ORDER — ATORVASTATIN CALCIUM 80 MG PO TABS
80.0000 mg | ORAL_TABLET | ORAL | Status: DC
  Administered 2015-05-29: 80 mg via ORAL
  Filled 2015-05-25: qty 1

## 2015-05-25 MED ORDER — MULTI VITAMIN DAILY PO TABS: 1.0000 | ORAL_TABLET | Freq: Every day | ORAL | Status: DC

## 2015-05-25 MED ORDER — ENSURE ENLIVE PO LIQD
237.0000 mL | Freq: Two times a day (BID) | ORAL | Status: DC
  Administered 2015-05-26 – 2015-06-02 (×15): 237 mL via ORAL

## 2015-05-25 MED ORDER — ASPIRIN EC 81 MG PO TBEC
81.0000 mg | DELAYED_RELEASE_TABLET | Freq: Every day | ORAL | Status: DC
  Administered 2015-05-26 – 2015-06-03 (×9): 81 mg via ORAL
  Filled 2015-05-25 (×9): qty 1

## 2015-05-25 MED ORDER — PANTOPRAZOLE SODIUM 40 MG PO TBEC
40.0000 mg | DELAYED_RELEASE_TABLET | Freq: Every day | ORAL | Status: DC
  Administered 2015-05-26 – 2015-06-03 (×9): 40 mg via ORAL
  Filled 2015-05-25 (×7): qty 1

## 2015-05-25 MED ORDER — CALCIUM CARBONATE-VITAMIN D 500-200 MG-UNIT PO TABS
1.0000 | ORAL_TABLET | Freq: Two times a day (BID) | ORAL | Status: DC
  Administered 2015-05-26 – 2015-06-03 (×17): 1 via ORAL
  Filled 2015-05-25 (×18): qty 1

## 2015-05-25 MED ORDER — ACETAMINOPHEN 325 MG PO TABS
650.0000 mg | ORAL_TABLET | Freq: Four times a day (QID) | ORAL | Status: DC | PRN
  Administered 2015-05-26 – 2015-05-28 (×3): 650 mg via ORAL
  Filled 2015-05-25 (×3): qty 2

## 2015-05-25 NOTE — ED Provider Notes (Signed)
CSN: [id number]     Arrival date & time 05/25/15  1658 History   First MD Initiated Contact with Patient 05/25/15 1711     Chief Complaint  Patient presents with  . Loss of Consciousness     (Consider location/radiation/quality/duration/timing/severity/associated sxs/prior Treatment) HPI Comments: Level V caveat as patient does not know why EMS was called. By report patient was found in her bed and was unable to get woken up by her niece. Patient's niece is not here. Patient states she does not know why the mons was called. She is alert and oriented 2. She complains of pain to her right low back for the past week that is constant. She denies any falls or injuries. She denies any chest pain or shortness of breath. By report patient was woken from sleep by her niece and then was disoriented so EMS was called. No recent fevers. No vomiting. No recent medication changes. Patient states she urinates frequently and has to use many diapers. She denies any focal weakness, numbness or tingling.  Patient is a 81 y.o. female presenting with syncope. The history is provided by the patient and the EMS personnel. The history is limited by the condition of the patient.  Loss of Consciousness   Past Medical History  Diagnosis Date  . CARCINOID TUMOR 04/13/2007  . HYPERTENSION 04/13/2007  . ALLERGIC RHINITIS 04/13/2007  . GERD 04/13/2007  . HYPERGLYCEMIA 04/13/2007  . CHOLELITHIASIS 05/02/2010  . Arthritis   . Cancer   . Environmental allergies     has mucus in throat  . Diphtheria     age 26  . CAD (coronary artery disease)     NSTEMI.  LHC 09/03/11: pLAD 40-50%, pDx 80-90% (very small), lateral branch of OM 99%, m-dCFX 40%, oRCA 80%, m-dRCA 50-70%, EF 55%.  She had PCI with BMS to lateral branch of the OM.  RCA was tx medically.   . Hyponatremia   . Murmur     echo 11/12: EF 55-60%, mod calcified AV annulus, mild AI, mild MR  . Dermatophytosis of nail   . UTI (urinary tract infection) 03/2015   Past  Surgical History  Procedure Laterality Date  . Tonsillectomy      many years ago  . Cardiac surgery    . Colon surgery      CANCER  . Small bowel repair    . Left heart catheterization with coronary angiogram N/A 09/03/2011    Procedure: LEFT HEART CATHETERIZATION WITH CORONARY ANGIOGRAM;  Surgeon: Peter M Martinique, MD;  Location[facility] C[facility] LAB;  Service: Cardiovascular;  Laterality: N/A;  . Percutaneous coronary stent intervention (pci-s)  09/03/2011    Procedure: PERCUTANEOUS CORONARY STENT INTERVENTION (PCI-S);  Surgeon: Peter M Martinique, MD;  Location: [facility] CATH LAB;  Service: Cardiovascular;;   Family History  Problem Relation Age of Onset  . Hypertension Mother   . Uterine cancer Mother   . Ovarian cancer Mother   . Hypertension Father   . Stroke Father   . Hypertension Brother   . Hyperlipidemia Brother   . Heart attack Brother   . Lung cancer Sister    History  Substance Use Topics  . Smoking status: Never Smoker   . Smokeless tobacco: Never Used  . Alcohol Use: No   OB History    No data available     Review of Systems  Unable to perform ROS: Dementia  Cardiovascular: Positive for syncope.      Allergies  Ciprofloxacin and Paxil  Home  Medications   Prior to Admission medications   Medication Sig Start Date End Date Taking? Authorizing Provider  aspirin 81 MG tablet Take 81 mg by mouth daily.    Yes Historical Provider, MD  atorvastatin (LIPITOR) 80 MG tablet Take 80 mg by mouth once a week. Take on Mondays   Yes Historical Provider, MD  calcium-vitamin D (OSCAL WITH D) 500-200 MG-UNIT per tablet Take 1 tablet by mouth 2 (two) times daily.   Yes Historical Provider, MD  feeding supplement, ENSURE ENLIVE, (ENSURE ENLIVE) LIQD Take 237 mLs by mouth 2 (two) times daily between meals. 04/07/15  Yes Annita Brod, MD  IRON PO Take 1 tablet by mouth daily.   Yes Historical Provider, MD  metoprolol tartrate (LOPRESSOR) 25 MG tablet Take 1 tablet (25 mg total) by  mouth 2 (two) times daily. 02/24/15  Yes Marin Olp, MD  Multiple Vitamin (MULTI VITAMIN DAILY) TABS Take 1 tablet by mouth daily. Pt. Not sure of dose.   Yes Historical Provider, MD  nitroGLYCERIN (NITROSTAT) 0.4 MG SL tablet Place 1 tablet (0.4 mg total) under the tongue every 5 (five) minutes as needed for chest pain. 02/24/15  Yes Marin Olp, MD  pantoprazole (PROTONIX) 40 MG tablet Take 1 tablet (40 mg total) by mouth daily. 02/24/15  Yes Marin Olp, MD  valsartan (DIOVAN) 160 MG tablet Take 1 tablet (160 mg total) by mouth daily. 03/10/15  Yes Marin Olp, MD  cefUROXime (CEFTIN) 500 MG tablet Take 1 tablet (500 mg total) by mouth 2 (two) times daily with a meal. Patient not taking: Reported on 05/25/2015 04/07/15   Annita Brod, MD   BP 157/56 mmHg  Pulse 89  Temp(Src) 97.8 F (36.6 C) (Oral)  Resp 16  Ht 5\' 7"  (1.702 m)  Wt 126 lb (57.153 kg)  BMI 19.73 kg/m2  SpO2 100% Physical Exam  Constitutional: She is oriented to person, place, and time. She appears well-developed and well-nourished. No distress.  HENT:  Head: Normocephalic and atraumatic.  Mouth/Throat: Oropharynx is clear and moist. No oropharyngeal exudate.  Eyes: Conjunctivae and EOM are normal. Pupils are equal, round, and reactive to light.  Neck: Normal range of motion. Neck supple.  No meningismus.  Cardiovascular: Normal rate, regular rhythm, normal heart sounds and intact distal pulses.   No murmur heard. Pulmonary/Chest: Effort normal and breath sounds normal. No respiratory distress.  Abdominal: Soft. There is no tenderness. There is no rebound and no guarding.  Musculoskeletal: Normal range of motion. She exhibits tenderness. She exhibits no edema.  R paraspinal lumbar pain  Neurological: She is alert and oriented to person, place, and time. No cranial nerve deficit. She exhibits normal muscle tone. Coordination normal.  No ataxia on finger to nose bilaterally. No pronator drift. 5/5  strength throughout. CN 2-12 intact.Equal grip strength. Sensation intact.   Skin: Skin is warm.  Psychiatric: She has a normal mood and affect. Her behavior is normal.  Nursing note and vitals reviewed.   ED Course  Procedures (including critical care time) Labs Review Labs Reviewed  CBC WITH DIFFERENTIAL/PLATELET - Abnormal; Notable for the following:    RBC 5.54 (*)    Hemoglobin 17.6 (*)    HCT 48.8 (*)    MCHC 36.1 (*)    RDW 16.5 (*)    Lymphs Abs 0.6 (*)    Monocytes Relative 13 (*)    All other components within normal limits  COMPREHENSIVE METABOLIC PANEL - Abnormal; Notable for  the following:    Sodium 128 (*)    Chloride 94 (*)    Glucose, Bld 101 (*)    Creatinine, Ser 1.07 (*)    Albumin 3.2 (*)    GFR calc non Af Amer 43 (*)    GFR calc Af Amer 50 (*)    All other components within normal limits  URINALYSIS, ROUTINE W REFLEX MICROSCOPIC (NOT AT [facility]) - Abnormal; Notable for the following:    APPearance CLOUDY (*)    Nitrite POSITIVE (*)    Leukocytes, UA MODERATE (*)    All other components within normal limits  URINE MICROSCOPIC-ADD ON - Abnormal; Notable for the following:    Bacteria, UA MANY (*)    All other components within normal limits  BRAIN NATRIURETIC PEPTIDE - Abnormal; Notable for the following:    B Natriuretic Peptide 154.7 (*)    All other components within normal limits  PROTIME-INR - Abnormal; Notable for the following:    Prothrombin Time 16.0 (*)    All other components within normal limits  URINE CULTURE  CULTURE, BLOOD (ROUTINE X 2)  CULTURE, BLOOD (ROUTINE X 2)  LIPASE, BLOOD  APTT  OSMOLALITY  OSMOLALITY, URINE  SODIUM, URINE, RANDOM  BASIC METABOLIC PANEL  CBC  LACTIC ACID, PLASMA  LACTIC ACID, PLASMA  PROCALCITONIN    Imaging Review Dg Chest 2 View  05/25/2015   CLINICAL DATA:  Back pain  EXAM: CHEST  2 VIEW  COMPARISON:  04/05/2015  FINDINGS: Mild cardiomegaly is stable. A coronary stent is noted. Stable mild aortic  tortuosity and hila.  There is no edema, consolidation, effusion, or pneumothorax.  Spondylosis and mild thoracolumbar scoliosis. No explanation for acute back pain.  IMPRESSION: No active cardiopulmonary disease.   Electronically Signed   By: Monte Fantasia M.D.   On: 05/25/2015 18:21   Dg Lumbar Spine Complete  05/25/2015   CLINICAL DATA:  Acute onset of lower back pain, particularly at the left sacrum. Patient unresponsive. Initial encounter.  EXAM: LUMBAR SPINE - COMPLETE 4+ VIEW  COMPARISON:  CT of the abdomen and pelvis from 04/01/2014, and lumbar spine radiographs performed 08/29/2011  FINDINGS: There is no evidence of fracture or subluxation. Vertebral bodies demonstrate normal height and alignment. Scattered lateral osteophytes are seen along the lower thoracic and lumbar spine. Mild left convex thoracolumbar scoliosis is noted. Intervertebral disc spaces are grossly preserved.  The visualized bowel gas pattern is unremarkable in appearance; air and stool are noted within the colon. The sacroiliac joints are within normal limits. Scattered vascular calcifications are seen.  IMPRESSION: 1. No evidence of fracture or subluxation along the lumbar spine. 2. Mild left convex thoracolumbar scoliosis noted; mild underlying degenerative change seen. 3. Scattered vascular calcifications seen.   Electronically Signed   By: Garald Balding M.D.   On: 05/25/2015 18:22   Ct Head Wo Contrast  05/25/2015   CLINICAL DATA:  Acute onset of unresponsiveness.  Initial encounter.  EXAM: CT HEAD WITHOUT CONTRAST  TECHNIQUE: Contiguous axial images were obtained from the base of the skull through the vertex without intravenous contrast.  COMPARISON:  CT of the head performed 04/05/2015  FINDINGS: There is no evidence of acute infarction, mass lesion, or intra- or extra-axial hemorrhage on CT.  Prominence of the ventricles and sulci reflects moderate cortical volume loss. Diffuse periventricular and subcortical white matter  change likely reflects small vessel ischemic microangiopathy. Cerebellar atrophy is noted.  The brainstem and fourth ventricle are within normal limits. The  basal ganglia are unremarkable in appearance. The cerebral hemispheres demonstrate grossly normal gray-white differentiation. No mass effect or midline shift is seen.  There is no evidence of fracture; visualized osseous structures are unremarkable in appearance. The orbits are within normal limits. The paranasal sinuses and mastoid air cells are well-aerated. No significant soft tissue abnormalities are seen.  IMPRESSION: 1. No acute intracranial pathology seen on CT. 2. Moderate cortical volume loss and diffuse small vessel ischemic microangiopathy.   Electronically Signed   By: Garald Balding M.D.   On: 05/25/2015 18:31   Ct Renal Stone Study  05/25/2015   CLINICAL DATA:  Right-sided flank pain  EXAM: CT ABDOMEN AND PELVIS WITHOUT CONTRAST  TECHNIQUE: Multidetector CT imaging of the abdomen and pelvis was performed following the standard protocol without IV contrast.  COMPARISON:  04/01/2014  FINDINGS: Lower chest: Minimal subpleural dependent atelectasis. Moderate cardiomegaly. Atheromatous aortic and coronary arterial calcifications are noted.  Hepatobiliary: Unenhanced CT was performed per clinician order. Lack of IV contrast limits sensitivity and specificity, especially for evaluation of abdominal/pelvic solid viscera. Unenhanced liver is grossly normal. Dependent stones are noted within the gallbladder.  Pancreas: Normal  Spleen: Normal  Adrenals/Urinary Tract: Adrenal glands are normal. No radiopaque renal, ureteral, or bladder calculus. No hydroureteronephrosis. Too small to characterize left mid renal cortical 8 mm hypodense lesion image 31.  Stomach/Bowel: Extensive diverticulosis without large bowel wall thickening. No bowel wall thickening or focal segmental dilatation is identified. Moderate stool burden. Appendix is normal.   Vascular/Lymphatic: Aortic ectasia noted with extensive vascular calcification but no aneurysm. Right lower quadrant 1 cm mesenteric node image 45 is nonspecific. No other lymphadenopathy.  Other: Uterus and ovaries are normal. Presumed sequela of previous trauma within the right buttock subcutaneous fat. No free air or fluid.  Musculoskeletal: Leftward curvature of the lumbar spine is noted. Bones are subjectively osteopenic with trabecular rarefaction. No compression deformity. Multilevel disc degenerative change.  IMPRESSION: No acute intra-abdominal or pelvic pathology.  Moderate stool burden.  Gallstones without other evidence for acute cholecystitis.   Electronically Signed   By: Conchita Paris M.D.   On: 05/25/2015 21:27     EKG Interpretation   Date/Time:  Thursday May 25 2015 17:04:15 EDT Ventricular Rate:  82 PR Interval:  176 QRS Duration: 81 QT Interval:  398 QTC Calculation: 465 R Axis:   46 Text Interpretation:  Sinus rhythm Multiple premature complexes, vent   RAE, consider biatrial enlargement Consider left ventricular hypertrophy  Anterior Q waves, possibly due to LVH No significant change was found  Confirmed by Wyvonnia Dusky  MD, Torre Pikus [phone]) on 05/25/2015 5:11:14 PM      MDM   Final diagnoses:  Flank pain  Urinary tract infection without hematuria, site unspecified  Hyponatremia    Patient from home with difficulty arousing. She is now awake and alert. She denies complaint other than right flank pain. Denies fall.  Workup remarkable for urinary tract infection, hyponatremia and hemoconcentration.  Patient is too weak to ambulate  Patient treated for UTI with IV Rocephin and gentle hydration given her diastolic heart clear history. CT shows no evidence of kidney stone.  She is too weak to walk and will be admitted for rehydration, IV antibiotics and correction of her hyponatremia. Discussed with Dr. Blaine Hamper.      Ezequiel Essex, MD 05/26/15 [phone]

## 2015-05-25 NOTE — ED Notes (Signed)
Care giver reports finding pt in bed and was unable to get pt to wake up states pt would not respond for over 2 minuites and when the pt opened her eyes she was " disoriented for the first few minutes." on arrrival to the ED pt is alert and ox4. Pt has no complaints.

## 2015-05-25 NOTE — H&P (Signed)
[facility] History and Physical  Becky Morales BTD:[id number] DOB: 11-17-1919 DOA: 05/25/2015  Referring physician: ED physician PCP: Garret Reddish, MD  Specialists:   Chief Complaint: Increased urinary frequency, altered mental status  HPI: Becky Morales is a 81 y.o. female with PMH of hypertension, hyperlipidemia, GERD, CAD (S/B stent placement to 2012), carcinoid tumor (not know the detail), diastolic congestive heart failure, who presents with increased urinary frequency and altered mental status.  Patient is accompanied by her niece. Per her niece, she was found in her bed and was unable to get woken up by her niece. Patient seems to be more confused than her baseline. Patient reports increased urinary frequency recently, but no dysuria or burning on urination. No nausea, vomiting, abdominal pain, diarrhea. She has pain to her right low back for the past week. No unilateral weakness, numbness, tingling sensations, urinary incontinence or loss of control for bowel movement. She denies any falls or injuries. Patient does not have fever, chills, chest pain, cough, shortness of breath, leg edema, rashes. When I saw patient in ED, she looks tired, her mental status already improved per her niece.   In ED, patient was found to have positive urinalysis for UTI, lipase 75, temperature normal, no tachycardia, hyponatremia sodium 128, creatinine 1.07. Chest x-ray negative for acute issues. CT head is negative for acute abnormalities. X-ray of lumbar spine is negative for acute abnormalities, but showed mild left convex thoracolumbar scoliosis and mild underlying degenerative change. CT per renal stone protocol is negative for kidney stone, but showed gallstone without cholecystitis. Patient is admitted to inpatient for further evaluation and treatment.  Where does patient live?   At home    Can patient participate in ADLs? barely  Review of Systems:   General: no fevers, chills, no changes in body  weight, has poor appetite, has fatigue HEENT: no blurry vision, hearing changes or sore throat Pulm: no dyspnea, coughing, wheezing CV: no chest pain, palpitations Abd: no nausea, vomiting, abdominal pain, diarrhea, constipation GU: no dysuria, burning on urination, has increased urinary frequency, no hematuria  Ext: no leg edema Neuro: no unilateral weakness, numbness, or tingling, no vision change or hearing loss. Had confusion Skin: no rash MSK: has lower back pain. No muscle spasm, no deformity. Heme: No easy bruising.  Travel history: No recent long distant travel.  Allergy:  Allergies  Allergen Reactions  . Ciprofloxacin     emesis  . Paxil [Paroxetine Hcl] Nausea And Vomiting    Past Medical History  Diagnosis Date  . CARCINOID TUMOR 04/13/2007  . HYPERTENSION 04/13/2007  . ALLERGIC RHINITIS 04/13/2007  . GERD 04/13/2007  . HYPERGLYCEMIA 04/13/2007  . CHOLELITHIASIS 05/02/2010  . Arthritis   . Cancer   . Environmental allergies     has mucus in throat  . Diphtheria     age 81  . CAD (coronary artery disease)     NSTEMI.  LHC 09/03/11: pLAD 40-50%, pDx 80-90% (very small), lateral branch of OM 99%, m-dCFX 40%, oRCA 80%, m-dRCA 50-70%, EF 55%.  She had PCI with BMS to lateral branch of the OM.  RCA was tx medically.   . Hyponatremia   . Murmur     echo 11/12: EF 55-60%, mod calcified AV annulus, mild AI, mild MR  . Dermatophytosis of nail   . UTI (urinary tract infection) 03/2015    Past Surgical History  Procedure Laterality Date  . Tonsillectomy      many years ago  . Cardiac  surgery    . Colon surgery      CANCER  . Small bowel repair    . Left heart catheterization with coronary angiogram N/A 09/03/2011    Procedure: LEFT HEART CATHETERIZATION WITH CORONARY ANGIOGRAM;  Surgeon: Peter M Martinique, MD;  Location: [facility] CATH LAB;  Service: Cardiovascular;  Laterality: N/A;  . Percutaneous coronary stent intervention (pci-s)  09/03/2011    Procedure: PERCUTANEOUS  CORONARY STENT INTERVENTION (PCI-S);  Surgeon: Peter M Martinique, MD;  Location: [facility] CATH LAB;  Service: Cardiovascular;;    Social History:  reports that she has never smoked. She has never used smokeless tobacco. She reports that she does not drink alcohol or use illicit drugs.  Family History:  Family History  Problem Relation Age of Onset  . Hypertension Mother   . Uterine cancer Mother   . Ovarian cancer Mother   . Hypertension Father   . Stroke Father   . Hypertension Brother   . Hyperlipidemia Brother   . Heart attack Brother   . Lung cancer Sister      Prior to Admission medications   Medication Sig Start Date End Date Taking? Authorizing Provider  aspirin 81 MG tablet Take 81 mg by mouth daily.     Historical Provider, MD  atorvastatin (LIPITOR) 80 MG tablet Take 80 mg by mouth once a week. Take on Mondays    Historical Provider, MD  calcium-vitamin D (OSCAL WITH D) 500-200 MG-UNIT per tablet Take 1 tablet by mouth 2 (two) times daily.    Historical Provider, MD  cefUROXime (CEFTIN) 500 MG tablet Take 1 tablet (500 mg total) by mouth 2 (two) times daily with a meal. 04/07/15   Annita Brod, MD  feeding supplement, ENSURE ENLIVE, (ENSURE ENLIVE) LIQD Take 237 mLs by mouth 2 (two) times daily between meals. 04/07/15   Annita Brod, MD  IRON PO Take 1 tablet by mouth daily.    Historical Provider, MD  metoprolol tartrate (LOPRESSOR) 25 MG tablet Take 1 tablet (25 mg total) by mouth 2 (two) times daily. 02/24/15   Marin Olp, MD  Multiple Vitamin (MULTI VITAMIN DAILY) TABS Take 1 tablet by mouth daily. Pt. Not sure of dose.    Historical Provider, MD  nitroGLYCERIN (NITROSTAT) 0.4 MG SL tablet Place 1 tablet (0.4 mg total) under the tongue every 5 (five) minutes as needed for chest pain. Patient not taking: Reported on 04/27/2015 02/24/15   Marin Olp, MD  pantoprazole (PROTONIX) 40 MG tablet Take 1 tablet (40 mg total) by mouth daily. 02/24/15   Marin Olp, MD   valsartan (DIOVAN) 160 MG tablet Take 1 tablet (160 mg total) by mouth daily. 03/10/15   Marin Olp, MD    Physical Exam: Filed Vitals:   05/25/15 2200 05/25/15 2219 05/25/15 2348 05/26/15 0200  BP: 199/70 150/97 157/56 140/48  Pulse: 80 97 89 88  Temp:   97.8 F (36.6 C)   TempSrc:   Oral   Resp: 16 21 16    Height:   5\' 7"  (1.702 m)   Weight:   57.153 kg (126 lb)   SpO2: 99% 100% 100%    General: Not in acute distress HEENT:       Eyes: PERRL, EOMI, no scleral icterus.       ENT: No discharge from the ears and nose, no pharynx injection, no tonsillar enlargement.        Neck: No JVD, no bruit, no mass felt. Heme: No neck  lymph node enlargement. Cardiac: S1/S2, RRR, No murmurs, No gallops or rubs. Pulm: No rales, wheezing, rhonchi or rubs. Abd: Soft, nondistended, nontender, no rebound pain, no organomegaly, BS present. Ext: No pitting leg edema bilaterally. 2+DP/PT pulse bilaterally. Musculoskeletal: No joint deformities, No joint redness or warmth, no limitation of ROM in spin. Has tenderness over lower back. Skin: No rashes.  Neuro: Alert, mildly confused, but still oriented X3, cranial nerves II-XII grossly intact, muscle strength 5/5 in all extremities, sensation to light touch intact. Brachial reflex 1+ bilaterally. Knee reflex 1+ bilaterally. Negative Babinski's sign. Normal finger to nose test. Psych: Patient is not psychotic, no suicidal or hemocidal ideation.  Labs on Admission:  Basic Metabolic Panel:  Recent Labs Lab 05/25/15 1743  NA 128*  K 4.3  CL 94*  CO2 23  GLUCOSE 101*  BUN 20  CREATININE 1.07*  CALCIUM 9.2   Liver Function Tests:  Recent Labs Lab 05/25/15 1743  AST 24  ALT 18  ALKPHOS 110  BILITOT 0.8  PROT 7.0  ALBUMIN 3.2*    Recent Labs Lab 05/25/15 1743  LIPASE 35   No results for input(s): AMMONIA in the last 168 hours. CBC:  Recent Labs Lab 05/25/15 1743  WBC 4.6  NEUTROABS 3.4  HGB 17.6*  HCT 48.8*  MCV 88.1   PLT 375   Cardiac Enzymes: No results for input(s): CKTOTAL, CKMB, CKMBINDEX, TROPONINI in the last 168 hours.  BNP (last 3 results)  Recent Labs  04/07/15 1130 05/25/15 2221  BNP 306.0* 154.7*    ProBNP (last 3 results)  Recent Labs  08/03/14 1643  PROBNP 181.0*    CBG: No results for input(s): GLUCAP in the last 168 hours.  Radiological Exams on Admission: Dg Chest 2 View  05/25/2015   CLINICAL DATA:  Back pain  EXAM: CHEST  2 VIEW  COMPARISON:  04/05/2015  FINDINGS: Mild cardiomegaly is stable. A coronary stent is noted. Stable mild aortic tortuosity and hila.  There is no edema, consolidation, effusion, or pneumothorax.  Spondylosis and mild thoracolumbar scoliosis. No explanation for acute back pain.  IMPRESSION: No active cardiopulmonary disease.   Electronically Signed   By: Monte Fantasia M.D.   On: 05/25/2015 18:21   Dg Lumbar Spine Complete  05/25/2015   CLINICAL DATA:  Acute onset of lower back pain, particularly at the left sacrum. Patient unresponsive. Initial encounter.  EXAM: LUMBAR SPINE - COMPLETE 4+ VIEW  COMPARISON:  CT of the abdomen and pelvis from 04/01/2014, and lumbar spine radiographs performed 08/29/2011  FINDINGS: There is no evidence of fracture or subluxation. Vertebral bodies demonstrate normal height and alignment. Scattered lateral osteophytes are seen along the lower thoracic and lumbar spine. Mild left convex thoracolumbar scoliosis is noted. Intervertebral disc spaces are grossly preserved.  The visualized bowel gas pattern is unremarkable in appearance; air and stool are noted within the colon. The sacroiliac joints are within normal limits. Scattered vascular calcifications are seen.  IMPRESSION: 1. No evidence of fracture or subluxation along the lumbar spine. 2. Mild left convex thoracolumbar scoliosis noted; mild underlying degenerative change seen. 3. Scattered vascular calcifications seen.   Electronically Signed   By: Garald Balding M.D.   On:  05/25/2015 18:22   Ct Head Wo Contrast  05/25/2015   CLINICAL DATA:  Acute onset of unresponsiveness.  Initial encounter.  EXAM: CT HEAD WITHOUT CONTRAST  TECHNIQUE: Contiguous axial images were obtained from the base of the skull through the vertex without intravenous contrast.  COMPARISON:  CT of the head performed 04/05/2015  FINDINGS: There is no evidence of acute infarction, mass lesion, or intra- or extra-axial hemorrhage on CT.  Prominence of the ventricles and sulci reflects moderate cortical volume loss. Diffuse periventricular and subcortical white matter change likely reflects small vessel ischemic microangiopathy. Cerebellar atrophy is noted.  The brainstem and fourth ventricle are within normal limits. The basal ganglia are unremarkable in appearance. The cerebral hemispheres demonstrate grossly normal gray-white differentiation. No mass effect or midline shift is seen.  There is no evidence of fracture; visualized osseous structures are unremarkable in appearance. The orbits are within normal limits. The paranasal sinuses and mastoid air cells are well-aerated. No significant soft tissue abnormalities are seen.  IMPRESSION: 1. No acute intracranial pathology seen on CT. 2. Moderate cortical volume loss and diffuse small vessel ischemic microangiopathy.   Electronically Signed   By: Garald Balding M.D.   On: 05/25/2015 18:31   Ct Renal Stone Study  05/25/2015   CLINICAL DATA:  Right-sided flank pain  EXAM: CT ABDOMEN AND PELVIS WITHOUT CONTRAST  TECHNIQUE: Multidetector CT imaging of the abdomen and pelvis was performed following the standard protocol without IV contrast.  COMPARISON:  04/01/2014  FINDINGS: Lower chest: Minimal subpleural dependent atelectasis. Moderate cardiomegaly. Atheromatous aortic and coronary arterial calcifications are noted.  Hepatobiliary: Unenhanced CT was performed per clinician order. Lack of IV contrast limits sensitivity and specificity, especially for evaluation of  abdominal/pelvic solid viscera. Unenhanced liver is grossly normal. Dependent stones are noted within the gallbladder.  Pancreas: Normal  Spleen: Normal  Adrenals/Urinary Tract: Adrenal glands are normal. No radiopaque renal, ureteral, or bladder calculus. No hydroureteronephrosis. Too small to characterize left mid renal cortical 8 mm hypodense lesion image 31.  Stomach/Bowel: Extensive diverticulosis without large bowel wall thickening. No bowel wall thickening or focal segmental dilatation is identified. Moderate stool burden. Appendix is normal.  Vascular/Lymphatic: Aortic ectasia noted with extensive vascular calcification but no aneurysm. Right lower quadrant 1 cm mesenteric node image 45 is nonspecific. No other lymphadenopathy.  Other: Uterus and ovaries are normal. Presumed sequela of previous trauma within the right buttock subcutaneous fat. No free air or fluid.  Musculoskeletal: Leftward curvature of the lumbar spine is noted. Bones are subjectively osteopenic with trabecular rarefaction. No compression deformity. Multilevel disc degenerative change.  IMPRESSION: No acute intra-abdominal or pelvic pathology.  Moderate stool burden.  Gallstones without other evidence for acute cholecystitis.   Electronically Signed   By: Conchita Paris M.D.   On: 05/25/2015 21:27    EKG: Independently reviewed.  Abnormal findings: PAC, QTc interval 465, bilateral atrial enlargement.     Assessment/Plan Principal Problem:   UTI (lower urinary tract infection) Active Problems:   Malignant carcinoid tumor of unknown primary site   Essential hypertension   Allergic rhinitis   GERD   Spinal stenosis of lumbar region at multiple levels   CAD (coronary artery disease)  s/p  PCI for NSTEMI. with angina.    Hyperlipidemia   DNR/DNI 07/21/14   Anxiety state   Hyponatremia   Chronic diastolic heart failure   Acute encephalopathy   Urinary tract infection   Back pain  UTI (lower urinary tract infection):  Increased urinary frequency and positive urinalysis consistent with UTI. Patient is not septic on admission. CT renal stone protocol is negative. Currently hemodynamically stable.  -  Admit to telemetry - CeftriaxoneIV - Follow up results of urine and blood cx and amend antibiotic regimen if needed per sensitivity results - prn  Zofran for nausea - will get Procalcitonin and trend lactic acid levels - IVF: 500cc + 1L of NS bolus, followed by 75 cc/h (patient has congestive heart failure, limiting aggressive IV fluids treatment).  Acute encephalopathy: Likely due to UTI. Hyponatremia may have also contributed her partially. -treat UTI as above -correct hyponatremia as below -Frequent neuro check  Hyponatremia: This seems to be chronic issue. Na was 130on 04/06/15, slightly worsening. Likely due to decreased oral intake. -IVF as above -follow up by BMP -check Osma of urine and plasma, urine Na level  Essential hypertension: Blood pressure was initially elevated at 221/78. Her blood pressure improved significantly after being treated with 1 dose of hydralazine 10 mg and 1 dose of labetalol 5 mg IV in the emergency room. -IV hydralazine when necessary -Irbesartan, metoprolol,  GERD: -Protonix  Back pain: X-ray of lumbar has no acute bony abnormalities. Patient is known to have spinal stenosis of lumbar region at multiple levels, no alarming symptoms, such as leg weakness or urinary incontinence. -When necessary Percocet and morphine  CAD (coronary artery disease): No chest pain now.  -Aspirin, metoprolol, when necessary nitroglycerin SL  HLD: Last LDL was 64 on 10/05/14. -Continue home medications: Lipitor -Check FLP  Chronic diastolic heart failure: 2-D echo on 02/05/13 showed EF of 60-65% with grade 1 diastolic dysfunction. Patient is not on diuretics at home. CHF is compensated. No leg edema. -Check BNP -Continue aspirin and metoprolol   DVT ppx: SQ Heparin   Code Status:  DNR Family Communication: Yes, patient's niece at bed side Disposition Plan: Admit to inpatient   Date of Service 05/26/2015    Ivor Costa [facility] Pager [phone]  If 7PM-7AM, please contact night-coverage [email or web address] Password [email or web address] 05/26/2015, 4:10 AM

## 2015-05-25 NOTE — ED Notes (Signed)
Report attempted nurse to call back.  

## 2015-05-25 NOTE — ED Notes (Signed)
Patient unable to ambulate

## 2015-05-25 NOTE — ED Notes (Signed)
Unable to complete orthostatic vitals. Pt unable to sit up or stand

## 2015-05-25 NOTE — ED Notes (Signed)
Transported to xray 

## 2015-05-26 ENCOUNTER — Inpatient Hospital Stay (HOSPITAL_COMMUNITY): Payer: Medicare Other

## 2015-05-26 LAB — BASIC METABOLIC PANEL
Anion gap: 7 (ref 5–15)
BUN: 15 mg/dL (ref 6–20)
CO2: 28 mmol/L (ref 22–32)
CREATININE: 1.08 mg/dL — AB (ref 0.44–1.00)
Calcium: 8.9 mg/dL (ref 8.9–10.3)
Chloride: 93 mmol/L — ABNORMAL LOW (ref 101–111)
GFR calc non Af Amer: 43 mL/min — ABNORMAL LOW (ref >60)
GFR, EST AFRICAN AMERICAN: 50 mL/min — AB (ref >60)
Glucose, Bld: 112 mg/dL — ABNORMAL HIGH (ref 65–99)
Potassium: 4.5 mmol/L (ref 3.5–5.1)
Sodium: 128 mmol/L — ABNORMAL LOW (ref 135–145)

## 2015-05-26 LAB — CBC
HEMATOCRIT: 46.9 % — AB (ref 36.0–46.0)
HEMOGLOBIN: 16.6 g/dL — AB (ref 12.0–15.0)
MCH: 31.3 pg (ref 26.0–34.0)
MCHC: 35.4 g/dL (ref 30.0–36.0)
MCV: 88.3 fL (ref 78.0–100.0)
PLATELETS: 416 10*3/uL — AB (ref 150–400)
RBC: 5.31 MIL/uL — AB (ref 3.87–5.11)
RDW: 16.9 % — ABNORMAL HIGH (ref 11.5–15.5)
WBC: 5.2 10*3/uL (ref 4.0–10.5)

## 2015-05-26 LAB — OSMOLALITY: OSMOLALITY: 274 mosm/kg — AB (ref 275–300)

## 2015-05-26 LAB — OSMOLALITY, URINE: Osmolality, Ur: 376 mOsm/kg — ABNORMAL LOW (ref 390–1090)

## 2015-05-26 LAB — SODIUM, URINE, RANDOM: SODIUM UR: 91 mmol/L

## 2015-05-26 LAB — PROCALCITONIN: Procalcitonin: <0.1 ng/mL

## 2015-05-26 LAB — GLUCOSE, CAPILLARY: Glucose-Capillary: 81 mg/dL (ref 65–99)

## 2015-05-26 LAB — LACTIC ACID, PLASMA: LACTIC ACID, VENOUS: 2.7 mmol/L — AB (ref 0.5–2.0)

## 2015-05-26 MED ORDER — SODIUM CHLORIDE 0.9 % IV BOLUS (SEPSIS)
1000.0000 mL | Freq: Once | INTRAVENOUS | Status: AC
  Administered 2015-05-26: 1000 mL via INTRAVENOUS

## 2015-05-26 MED ORDER — HYDRALAZINE HCL 10 MG PO TABS
10.0000 mg | ORAL_TABLET | Freq: Three times a day (TID) | ORAL | Status: DC
  Administered 2015-05-26 – 2015-06-03 (×24): 10 mg via ORAL
  Filled 2015-05-26 (×28): qty 1

## 2015-05-26 NOTE — Progress Notes (Signed)
Lactic acid 2.7, MD notified will continue to monitor, thanks

## 2015-05-26 NOTE — Progress Notes (Addendum)
PROGRESS NOTE  Becky Morales JHE:[id number] DOB: [birth year] DOA: 05/25/2015 PCP: Garret Reddish, MD  HPI: Becky Morales is a 81 y.o. female with PMH of hypertension, hyperlipidemia, GERD, CAD (S/B stent placement to 2012), carcinoid tumor (not know the detail), diastolic congestive heart failure, who presented on 8/4 eening with increased urinary frequency and altered mental status.  Subjective / 24 H Interval events - complains of right hip pain - feeling better, oriented to place, year, person and situation  Assessment/Plan: Principal Problem:   UTI (lower urinary tract infection) Active Problems:   Malignant carcinoid tumor of unknown primary site   Essential hypertension   Allergic rhinitis   GERD   Spinal stenosis of lumbar region at multiple levels   CAD (coronary artery disease)  s/p  PCI for NSTEMI. with angina.    Hyperlipidemia   DNR/DNI 07/21/14   Anxiety state   Hyponatremia   Chronic diastolic heart failure   Acute encephalopathy   Urinary tract infection   Back pain   UTI (lower urinary tract infection):  - Increased urinary frequency and positive urinalysis consistent with UTI.  - continue Ceftriaxone IV - Follow up results of urine and blood cx and amend antibiotic regimen if needed per sensitivity results  Acute encephalopathy:  - Likely due to UTI, improving this morning, patient alert, oriented and appropriate  Hyponatremia:  - This seems to be chronic issue. Na was 130on 04/06/15, slightly worsening.  - Likely due to decreased oral intake, continue IVF  Essential hypertension:  - IV hydralazine when necessary - Irbesartan, metoprolol,  GERD: - Protonix  Back pain / hip:  - X-ray of lumbar has no acute bony abnormalities. Patient is known to have spinal stenosis of lumbar region at multiple levels, no alarming symptoms, such as leg weakness or urinary incontinence. - When necessary Percocet and morphine - will do a hip film as she is having right hip  pain  CAD (coronary artery disease): No chest pain.  - Aspirin, metoprolol, when necessary nitroglycerin SL  HLD: Last LDL was 64 on 10/05/14. - Continue home medications: Lipitor  Chronic diastolic heart failure:  - 2-D echo on 02/05/13 showed EF of 60-65% with grade 1 diastolic dysfunction. Patient is not on diuretics at home. CHF is compensated. No leg edema. - Continue aspirin and metoprolol - she is eating now, will discontinue fluid this morning    Diet: Diet regular Room service appropriate?: Yes; Fluid consistency:: Thin Fluids: none DVT Prophylaxis: heparin s.q.  Code Status: DNR Family Communication: no family bedside  Disposition Plan: pending PT eval and urine culture results  Consultants:  None   Procedures:  None    Antibiotics Ceftriaxone 8/4 >>   Studies  Dg Chest 2 View  05/25/2015   CLINICAL DATA:  Back pain  EXAM: CHEST  2 VIEW  COMPARISON:  04/05/2015  FINDINGS: Mild cardiomegaly is stable. A coronary stent is noted. Stable mild aortic tortuosity and hila.  There is no edema, consolidation, effusion, or pneumothorax.  Spondylosis and mild thoracolumbar scoliosis. No explanation for acute back pain.  IMPRESSION: No active cardiopulmonary disease.   Electronically Signed   By: Monte Fantasia M.D.   On: 05/25/2015 18:21   Dg Lumbar Spine Complete  05/25/2015   CLINICAL DATA:  Acute onset of lower back pain, particularly at the left sacrum. Patient unresponsive. Initial encounter.  EXAM: LUMBAR SPINE - COMPLETE 4+ VIEW  COMPARISON:  CT of the abdomen and pelvis from 04/01/2014, and lumbar  spine radiographs performed 08/29/2011  FINDINGS: There is no evidence of fracture or subluxation. Vertebral bodies demonstrate normal height and alignment. Scattered lateral osteophytes are seen along the lower thoracic and lumbar spine. Mild left convex thoracolumbar scoliosis is noted. Intervertebral disc spaces are grossly preserved.  The visualized bowel gas pattern is  unremarkable in appearance; air and stool are noted within the colon. The sacroiliac joints are within normal limits. Scattered vascular calcifications are seen.  IMPRESSION: 1. No evidence of fracture or subluxation along the lumbar spine. 2. Mild left convex thoracolumbar scoliosis noted; mild underlying degenerative change seen. 3. Scattered vascular calcifications seen.   Electronically Signed   By: Garald Balding M.D.   On: 05/25/2015 18:22   Ct Head Wo Contrast  05/25/2015   CLINICAL DATA:  Acute onset of unresponsiveness.  Initial encounter.  EXAM: CT HEAD WITHOUT CONTRAST  TECHNIQUE: Contiguous axial images were obtained from the base of the skull through the vertex without intravenous contrast.  COMPARISON:  CT of the head performed 04/05/2015  FINDINGS: There is no evidence of acute infarction, mass lesion, or intra- or extra-axial hemorrhage on CT.  Prominence of the ventricles and sulci reflects moderate cortical volume loss. Diffuse periventricular and subcortical white matter change likely reflects small vessel ischemic microangiopathy. Cerebellar atrophy is noted.  The brainstem and fourth ventricle are within normal limits. The basal ganglia are unremarkable in appearance. The cerebral hemispheres demonstrate grossly normal gray-white differentiation. No mass effect or midline shift is seen.  There is no evidence of fracture; visualized osseous structures are unremarkable in appearance. The orbits are within normal limits. The paranasal sinuses and mastoid air cells are well-aerated. No significant soft tissue abnormalities are seen.  IMPRESSION: 1. No acute intracranial pathology seen on CT. 2. Moderate cortical volume loss and diffuse small vessel ischemic microangiopathy.   Electronically Signed   By: Garald Balding M.D.   On: 05/25/2015 18:31   Ct Renal Stone Study  05/25/2015   CLINICAL DATA:  Right-sided flank pain  EXAM: CT ABDOMEN AND PELVIS WITHOUT CONTRAST  TECHNIQUE: Multidetector CT  imaging of the abdomen and pelvis was performed following the standard protocol without IV contrast.  COMPARISON:  04/01/2014  FINDINGS: Lower chest: Minimal subpleural dependent atelectasis. Moderate cardiomegaly. Atheromatous aortic and coronary arterial calcifications are noted.  Hepatobiliary: Unenhanced CT was performed per clinician order. Lack of IV contrast limits sensitivity and specificity, especially for evaluation of abdominal/pelvic solid viscera. Unenhanced liver is grossly normal. Dependent stones are noted within the gallbladder.  Pancreas: Normal  Spleen: Normal  Adrenals/Urinary Tract: Adrenal glands are normal. No radiopaque renal, ureteral, or bladder calculus. No hydroureteronephrosis. Too small to characterize left mid renal cortical 8 mm hypodense lesion image 31.  Stomach/Bowel: Extensive diverticulosis without large bowel wall thickening. No bowel wall thickening or focal segmental dilatation is identified. Moderate stool burden. Appendix is normal.  Vascular/Lymphatic: Aortic ectasia noted with extensive vascular calcification but no aneurysm. Right lower quadrant 1 cm mesenteric node image 45 is nonspecific. No other lymphadenopathy.  Other: Uterus and ovaries are normal. Presumed sequela of previous trauma within the right buttock subcutaneous fat. No free air or fluid.  Musculoskeletal: Leftward curvature of the lumbar spine is noted. Bones are subjectively osteopenic with trabecular rarefaction. No compression deformity. Multilevel disc degenerative change.  IMPRESSION: No acute intra-abdominal or pelvic pathology.  Moderate stool burden.  Gallstones without other evidence for acute cholecystitis.   Electronically Signed   By: Conchita Paris M.D.   On:  05/25/2015 21:27   Dg Hip Unilat With Pelvis 2-3 Views Right  05/26/2015   CLINICAL DATA:  Right hip pain for several weeks.  EXAM: DG HIP (WITH OR WITHOUT PELVIS) 2-3V RIGHT  COMPARISON:  CT 04/01/2014  FINDINGS: Degenerative changes  lumbar spine and both hips. Diffuse osteopenia. No acute bony abnormality.  IMPRESSION: Diffuse osteopenia and degenerative change.  No acute abnormality.   Electronically Signed   By: Marcello Moores  Register   On: 05/26/2015 08:46   Objective  Filed Vitals:   05/25/15 2348 05/26/15 0200 05/26/15 0607 05/26/15 1143  BP: 157/56 140/48 156/56 190/68  Pulse: 89 88 74 74  Temp: 97.8 F (36.6 C)  98 F (36.7 C)   TempSrc: Oral  Oral   Resp: 16  18   Height: 5\' 7"  (1.702 m)     Weight: 57.153 kg (126 lb)  58.333 kg (128 lb 9.6 oz)   SpO2: 100%  99%     Intake/Output Summary (Last 24 hours) at 05/26/15 1149 Last data filed at 05/26/15 1133  Gross per 24 hour  Intake 106.75 ml  Output     75 ml  Net  31.75 ml   Filed Weights   05/25/15 1704 05/25/15 2348 05/26/15 0607  Weight: 63.05 kg (139 lb) 57.153 kg (126 lb) 58.333 kg (128 lb 9.6 oz)   Exam:  GENERAL: NAD  HEENT: Head is normocephalic and atraumatic. Extraocular muscles are intact. Pupils are equal, round, and reactive to light and accommodation.   NECK: Supple. No carotid bruits. No lymphadenopathy or thyromegaly.  LUNGS: Clear to auscultation. No wheezing or crackles  HEART: Regular rate and rhythm without murmur. 2+ pulses, no JVD, no peripheral edema  ABDOMEN: Soft, nontender, and nondistended. Positive bowel sounds. No hepatosplenomegaly was noted.  EXTREMITIES: Without any cyanosis, clubbing, rash, lesions or edema.  NEUROLOGIC: He is alert and oriented x3. Cranial nerves II through XII are grossly intact. Strength 5/5 in all 4.  PSYCHIATRIC: Normal mood and affect  SKIN: No ulceration or induration present.  Data Reviewed: Basic Metabolic Panel:  Recent Labs Lab 05/25/15 1743 05/26/15 0452  NA 128* 128*  K 4.3 4.5  CL 94* 93*  CO2 23 28  GLUCOSE 101* 112*  BUN 20 15  CREATININE 1.07* 1.08*  CALCIUM 9.2 8.9   Liver Function Tests:  Recent Labs Lab 05/25/15 1743  AST 24  ALT 18  ALKPHOS 110    BILITOT 0.8  PROT 7.0  ALBUMIN 3.2*    Recent Labs Lab 05/25/15 1743  LIPASE 35   CBC:  Recent Labs Lab 05/25/15 1743 05/26/15 0452  WBC 4.6 5.2  NEUTROABS 3.4  --   HGB 17.6* 16.6*  HCT 48.8* 46.9*  MCV 88.1 88.3  PLT 375 416*   BNP (last 3 results)  Recent Labs  04/07/15 1130 05/25/15 2221  BNP 306.0* 154.7*    ProBNP (last 3 results)  Recent Labs  08/03/14 1643  PROBNP 181.0*    CBG:  Recent Labs Lab 05/26/15 0811  GLUCAP 81   Scheduled Meds: . aspirin EC  81 mg Oral Daily  . [START ON 05/29/2015] atorvastatin  80 mg Oral Q Mon-1800  . calcium-vitamin D  1 tablet Oral BID  . cefTRIAXone (ROCEPHIN)  IV  1 g Intravenous Q24H  . feeding supplement (ENSURE ENLIVE)  237 mL Oral BID BM  . ferrous sulfate  325 mg Oral Q breakfast  . heparin  5,000 Units Subcutaneous 3 times per day  .  irbesartan  150 mg Oral Daily  . labetalol  5 mg Intravenous Once  . metoprolol tartrate  25 mg Oral BID  . multivitamin with minerals  1 tablet Oral Daily  . pantoprazole  40 mg Oral Daily  . sodium chloride  3 mL Intravenous Q12H   Continuous Infusions: . sodium chloride 75 mL/hr at 05/26/15 0117   Time spent coordinating care: 25 minutes, greater than 50% of this time was spent in counseling, explanation of diagnosis, planning of further management, coordination of care.   Marzetta Board, MD [facility] Pager [phone]. If 7 PM - 7 AM, please contact night-coverage at [email or web address], password [facility] [place] 05/26/2015, 11:49 AM  LOS: 1 day

## 2015-05-26 NOTE — Evaluation (Signed)
Physical Therapy Evaluation Patient Details Name: MATTINGLY FOUNTAINE MRN: [id number] DOB: Mar 26, 1920 Today's Date: 05/26/2015   History of Present Illness  Pt is is a 81 y.o. female with PMH of hypertension, hyperlipidemia, GERD, CAD (S/B stent placement to 2012), carcinoid tumor (not know the detail), diastolic congestive heart failure, who presents with increased urinary frequency and altered mental status.  Clinical Impression  Pt presents with above and decreased mobility, decreased balance, decreased strength and overall decreased safety awareness with use of RW.  Pt with multiple episodes of emesis during session but agreeable and wanting to ambulate to/from restroom.  She was able to do so with RW at min/mod A level due to needing assist to steady and steer RW for safety.  Pt will benefit from acute services to address deficits.  PT recommends SNF for follow up at d/C as pt will not have 24/7 at home.      Follow Up Recommendations SNF;Supervision/Assistance - 24 hour    Equipment Recommendations  Rolling walker with 5" wheels    Recommendations for Other Services       Precautions / Restrictions Precautions Precautions: Fall Restrictions Weight Bearing Restrictions: No      Mobility  Bed Mobility Overal bed mobility: Needs Assistance Bed Mobility: Supine to Sit     Supine to sit: Min assist     General bed mobility comments: use of rail. Pt does well with using UEs to help pull herself higher in the bed once back to supine.   Transfers Overall transfer level: Needs assistance Equipment used: Rolling walker (2 wheeled) Transfers: Sit to/from Stand Sit to Stand: Min assist         General transfer comment: min assist to rise and steady. repeated verbal and HOH cues for hand placement and overall safety.  Ambulation/Gait Ambulation/Gait assistance: Mod assist;Min assist Ambulation Distance (Feet): 25 Feet (x2) Assistive device: Rolling walker (2 wheeled) Gait  Pattern/deviations: Step-through pattern;Decreased stride length;Shuffle;Trunk flexed;Narrow base of support     General Gait Details: Pt agreeable to gait to/from restroom with use of RW.   Requires mod to max cues at times for safe use of RW and assist for steering and negotiating RW through tight spaces.    Stairs            Wheelchair Mobility    Modified Rankin (Stroke Patients Only)       Balance Overall balance assessment: Needs assistance Sitting-balance support: Feet supported Sitting balance-Leahy Scale: Fair     Standing balance support: During functional activity;Single extremity supported Standing balance-Leahy Scale: Poor Standing balance comment: Pt requires min/mod A to stand when performing peri care.  Note heavy reliance of LEs on toilet to maintain balance.                              Pertinent Vitals/Pain Pain Assessment: No/denies pain    Home Living Family/patient expects to be discharged to:: Skilled nursing facility Living Arrangements: Alone (cousins that check in morning and afternoon on her) Available Help at Discharge: Available PRN/intermittently;Family Type of Home: House Home Access: Stairs to enter Entrance Stairs-Rails: Right;Left;Can reach both Entrance Stairs-Number of Steps: 3 Home Layout: One level Home Equipment: Shower seat;Cane - single point;Walker - 2 wheels Additional Comments: she states her walker is too heavy and tall for her.    Prior Function Level of Independence: Needs assistance   Gait / Transfers Assistance Needed: uses cane inside the household.  ADL's / Homemaking Assistance Needed: She has been able to do her own laundry up until this admission, she gets Meals On Wheels and her cousins help bring meals. Pt does own personal care.         Hand Dominance        Extremity/Trunk Assessment   Upper Extremity Assessment: Generalized weakness           Lower Extremity Assessment:  Generalized weakness      Cervical / Trunk Assessment: Kyphotic  Communication   Communication: No difficulties  Cognition Arousal/Alertness: Awake/alert Behavior During Therapy: WFL for tasks assessed/performed Overall Cognitive Status: No family/caregiver present to determine baseline cognitive functioning       Memory: Decreased recall of precautions              General Comments      Exercises        Assessment/Plan    PT Assessment Patient needs continued PT services  PT Diagnosis Difficulty walking;Generalized weakness   PT Problem List Decreased strength;Decreased activity tolerance;Decreased balance;Decreased mobility;Decreased knowledge of use of DME;Decreased safety awareness;Decreased knowledge of precautions  PT Treatment Interventions DME instruction;Gait training;Functional mobility training;Therapeutic activities;Therapeutic exercise;Balance training;Patient/family education   PT Goals (Current goals can be found in the Care Plan section) Acute Rehab PT Goals Patient Stated Goal: to feel better and not be nauseous PT Goal Formulation: With patient Time For Goal Achievement: 06/02/15 Potential to Achieve Goals: Good    Frequency Min 2X/week   Barriers to discharge Decreased caregiver support      Co-evaluation               End of Session   Activity Tolerance: Other (comment) (limited due to nausea) Patient left: in bed;with call bell/phone within reach;with bed alarm set Nurse Communication: Mobility status;Other (comment) (need for nausea meds)         Time: 3235-5732 PT Time Calculation (min) (ACUTE ONLY): 32 min   Charges:   PT Evaluation $Initial PT Evaluation Tier I: 1 Procedure     PT G CodesDenice Bors 05/26/2015, 12:38 PM

## 2015-05-26 NOTE — Evaluation (Signed)
Occupational Therapy Evaluation Patient Details Name: PARA COSSEY MRN: [id number] DOB: 1920/02/09 Today's Date: 05/26/2015    History of Present Illness Pt is is a 81 y.o. female with PMH of hypertension, hyperlipidemia, GERD, CAD (S/B stent placement to 2012), carcinoid tumor (not know the detail), diastolic congestive heart failure, who presents with increased urinary frequency and altered mental status.   Clinical Impression   Pt limited by significant nausea and vomiting this session. Pt did transfer into the bathroom with mod assist and walker for toilet transfer and had a BM and voided. Pt needs constant cues for safety with walker use. She has limited assist at home so feel she will need SNF for safety at d/c. Will follow on acute to progress ADL independence.     Follow Up Recommendations  SNF;Supervision/Assistance - 24 hour    Equipment Recommendations  3 in 1 bedside comode    Recommendations for Other Services       Precautions / Restrictions Precautions Precautions: Fall      Mobility Bed Mobility Overal bed mobility: Needs Assistance Bed Mobility: Supine to Sit     Supine to sit: Min assist     General bed mobility comments: use of rail. Pt does well with using UEs to help pull herself higher in the bed once back to supine.   Transfers Overall transfer level: Needs assistance Equipment used: Rolling walker (2 wheeled) Transfers: Sit to/from Stand Sit to Stand: Min assist         General transfer comment: min assist to rise and steady. repeated cues for hand placement and overall safety.    Balance Overall balance assessment: Needs assistance   Sitting balance-Leahy Scale: Fair       Standing balance-Leahy Scale: Poor                              ADL Overall ADL's : Needs assistance/impaired Eating/Feeding: Independent;Bed level   Grooming: Wash/dry hands;Minimal assistance;Standing   Upper Body Bathing: Supervision/ safety;Set  up;Sitting   Lower Body Bathing: Moderate assistance;Sit to/from stand   Upper Body Dressing : Minimal assistance;Sitting   Lower Body Dressing: Moderate assistance;Sit to/from stand   Toilet Transfer: Moderate assistance;Ambulation;Comfort height toilet;Grab bars;RW   Toileting- Clothing Manipulation and Hygiene: Moderate assistance;Sit to/from stand         General ADL Comments: Upon OT/PT arrival pt stating she doesnt feel well and states she has been vomiting alot since returning from her xray this am. She vomited multiple times at the start of therapy session and pt states she cant even keep water down right now. Pt requires mod cues for safety with walker as she tends to push it far ahead of herself and needs assist to steer walker through tighter space in bathroom. She also needs repeated cues for hand placement to stand up and sit down safely. Pt continuing to vomit on the commode in the bathroom. Nursing made aware and pt also with BM.      Vision     Perception     Praxis      Pertinent Vitals/Pain Pain Assessment: No/denies pain     Hand Dominance     Extremity/Trunk Assessment Upper Extremity Assessment Upper Extremity Assessment: Generalized weakness           Communication Communication Communication: No difficulties   Cognition Arousal/Alertness: Awake/alert Behavior During Therapy: WFL for tasks assessed/performed Overall Cognitive Status: No family/caregiver present to determine baseline  cognitive functioning       Memory: Decreased recall of precautions             General Comments       Exercises       Shoulder Instructions      Home Living Family/patient expects to be discharged to:: Skilled nursing facility Living Arrangements: Alone (cousins that check in morning and afternoon on her) Available Help at Discharge: Available PRN/intermittently;Family Type of Home: House Home Access: Stairs to enter [employer] of  Steps: 3 Entrance Stairs-Rails: Right;Left;Can reach both Home Layout: One level     Bathroom Shower/Tub: Occupational psychologist: Standard     Home Equipment: Shower seat;Cane - single point;Walker - 2 wheels   Additional Comments: she states her walker is too heavy and tall for her.      Prior Functioning/Environment Level of Independence: Needs assistance  Gait / Transfers Assistance Needed: uses cane inside the household. ADL's / Homemaking Assistance Needed: She has been able to do her own laundry up until this admission, she gets Meals On Wheels and her cousins help bring meals. Pt does own personal care.         OT Diagnosis: Generalized weakness   OT Problem List: Decreased strength;Decreased knowledge of use of DME or AE   OT Treatment/Interventions: Self-care/ADL training;Patient/family education;Therapeutic activities;DME and/or AE instruction    OT Goals(Current goals can be found in the care plan section) Acute Rehab OT Goals Patient Stated Goal: to feel better and not be nauseous OT Goal Formulation: With patient Time For Goal Achievement: 06/09/15 Potential to Achieve Goals: Good  OT Frequency: Min 2X/week   Barriers to D/C:            Co-evaluation              End of Session Equipment Utilized During Treatment: Rolling walker  Activity Tolerance: Other (comment) (limited by nausea) Patient left: in bed;with call bell/phone within reach   Time: 8315-1761 OT Time Calculation (min): 32 min Charges:  OT General Charges $OT Visit: 1 Procedure OT Evaluation $Initial OT Evaluation Tier I: 1 Procedure G-Codes:    Jules Schick  [phone] 05/26/2015, 11:59 AM

## 2015-05-27 DIAGNOSIS — M5489 Other dorsalgia: Secondary | ICD-10-CM

## 2015-05-27 DIAGNOSIS — N390 Urinary tract infection, site not specified: Secondary | ICD-10-CM

## 2015-05-27 DIAGNOSIS — I2510 Atherosclerotic heart disease of native coronary artery without angina pectoris: Secondary | ICD-10-CM

## 2015-05-27 DIAGNOSIS — A419 Sepsis, unspecified organism: Secondary | ICD-10-CM | POA: Diagnosis present

## 2015-05-27 DIAGNOSIS — E872 Acidosis, unspecified: Secondary | ICD-10-CM | POA: Diagnosis present

## 2015-05-27 DIAGNOSIS — I10 Essential (primary) hypertension: Secondary | ICD-10-CM

## 2015-05-27 DIAGNOSIS — E785 Hyperlipidemia, unspecified: Secondary | ICD-10-CM | POA: Diagnosis present

## 2015-05-27 DIAGNOSIS — I5032 Chronic diastolic (congestive) heart failure: Secondary | ICD-10-CM

## 2015-05-27 DIAGNOSIS — M549 Dorsalgia, unspecified: Secondary | ICD-10-CM | POA: Diagnosis present

## 2015-05-27 LAB — BASIC METABOLIC PANEL
ANION GAP: 13 (ref 5–15)
BUN: 12 mg/dL (ref 6–20)
CO2: 21 mmol/L — ABNORMAL LOW (ref 22–32)
CREATININE: 1.01 mg/dL — AB (ref 0.44–1.00)
Calcium: 9 mg/dL (ref 8.9–10.3)
Chloride: 92 mmol/L — ABNORMAL LOW (ref 101–111)
GFR calc Af Amer: 54 mL/min — ABNORMAL LOW (ref >60)
GFR calc non Af Amer: 46 mL/min — ABNORMAL LOW (ref >60)
Glucose, Bld: 101 mg/dL — ABNORMAL HIGH (ref 65–99)
Potassium: 4.3 mmol/L (ref 3.5–5.1)
SODIUM: 126 mmol/L — AB (ref 135–145)

## 2015-05-27 LAB — CBC
HEMATOCRIT: 48.6 % — AB (ref 36.0–46.0)
Hemoglobin: 17.5 g/dL — ABNORMAL HIGH (ref 12.0–15.0)
MCH: 31.9 pg (ref 26.0–34.0)
MCHC: 36 g/dL (ref 30.0–36.0)
MCV: 88.5 fL (ref 78.0–100.0)
Platelets: 406 10*3/uL — ABNORMAL HIGH (ref 150–400)
RBC: 5.49 MIL/uL — ABNORMAL HIGH (ref 3.87–5.11)
RDW: 16.9 % — ABNORMAL HIGH (ref 11.5–15.5)
WBC: 5.9 10*3/uL (ref 4.0–10.5)

## 2015-05-27 LAB — URINE CULTURE: Culture: >=100000

## 2015-05-27 LAB — LACTIC ACID, PLASMA
LACTIC ACID, VENOUS: 2.7 mmol/L — AB (ref 0.5–2.0)
Lactic Acid, Venous: 2.2 mmol/L (ref 0.5–2.0)
Lactic Acid, Venous: 1.2 mmol/L (ref 0.5–2.0)

## 2015-05-27 LAB — GLUCOSE, CAPILLARY: GLUCOSE-CAPILLARY: 95 mg/dL (ref 65–99)

## 2015-05-27 LAB — URIC ACID: URIC ACID, SERUM: 2.5 mg/dL (ref 2.3–6.6)

## 2015-05-27 MED ORDER — PROMETHAZINE HCL 25 MG/ML IJ SOLN
6.2500 mg | Freq: Once | INTRAMUSCULAR | Status: AC
  Administered 2015-05-27: 6.25 mg via INTRAVENOUS
  Filled 2015-05-27: qty 1

## 2015-05-27 MED ORDER — SODIUM CHLORIDE 0.9 % IV BOLUS (SEPSIS)
1000.0000 mL | Freq: Once | INTRAVENOUS | Status: AC
  Administered 2015-05-27: 1000 mL via INTRAVENOUS

## 2015-05-27 NOTE — Progress Notes (Signed)
[facility] PROGRESS NOTE  Becky Morales PYK:[id number] DOB: 04/09/1920 DOA: 05/25/2015 PCP: Garret Reddish, MD HPI/Subjective: 81 y.o. BF PMHx of hypertension, hyperlipidemia, GERD, CAD (S/P stent placement to 2012), carcinoid tumor (not know the detail), HTN,Diastolic congestive heart failure, CAD native artery, murmur 11/12 LVEF =55-60%, hyponatremia.  Presents with increased urinary frequency and altered mental status. Patient is accompanied by her niece. Per her niece, she was found in her bed and was unable to get woken up by her niece. Patient seems to be more confused than her baseline. Patient reports increased urinary frequency recently, but no dysuria or burning on urination. No nausea, vomiting, abdominal pain, diarrhea. She has pain to her right low back for the past week. No unilateral weakness, numbness, tingling sensations, urinary incontinence or loss of control for bowel movement. She denies any falls or injuries. Patient does not have fever, chills, chest pain, cough, shortness of breath, leg edema, rashes. When I saw patient in ED, she looks tired, her mental status already improved per her niece.   In ED, patient was found to have positive urinalysis for UTI, lipase 75, temperature normal, no tachycardia, hyponatremia sodium 128, creatinine 1.07. Chest x-ray negative for acute issues. CT head is negative for acute abnormalities. X-ray of lumbar spine is negative for acute abnormalities, but showed mild left convex thoracolumbar scoliosis and mild underlying degenerative change. CT per renal stone protocol is negative for kidney stone, but showed gallstone without cholecystitis. Patient is admitted to inpatient for further evaluation and treatment.  8/6 A/O 4, NAD, negative N/V, negative SOB, negative CP, negative abdominal pain, negative CVA tenderness. However patient states that last night the [place] before having a party in her room. Unable to convince her that this was probably  secondary to her illness.    Assessment/Plan: UTI (lower urinary tract infection)/:Sepsis? - Although patient does not meet guidelines for sepsis, with a lactic acidosis in a 81 year old will treat sepsis, patient may not express fever, leukocytosis. -  positive AEROCOCCUS URINAEI. Literature review shows that not only can this bacteria cause UTI but in some cases may cause endocarditis. Consult infectious disease in the a.m. -Echocardiogram pending - continue Ceftriaxone IV  Lactic acidosis -Increasing lactic acid -Bolus 1 L normal saline, then normal saline at 133ml/hr  Acute encephalopathy:  -Resolved, however some confusion as to events last night  -Hyponatremia:  - This seems to be chronic issue. Na was 130on 04/06/15, slightly worsening.  - Likely due to decreased oral intake, continue IVF -Review of EMR does not show medication which should cause hyponatremia -Obtain urine osmolality, serum osmolality, urine sodium, uric acid (SIADH?)  Essential hypertension:  - IV hydralazine when necessary - Irbesartan, metoprolol,  GERD: - Protonix  Back pain / hip:  - X-ray of lumbar has no acute bony abnormalities. Patient is known to have spinal stenosis of lumbar region at multiple levels, no alarming symptoms, such as leg weakness or urinary incontinence. - When necessary Percocet and morphine - will do a hip film as she is having right hip pain  CAD (coronary artery disease)in native artery: - No chest pain.  - Aspirin, metoprolol, when necessary nitroglycerin SL  HLD: Last LDL was 64 on 10/05/14. - Continue home medications: Lipitor  Chronic diastolic heart failure:  - 2-D echo on 02/05/13 showed EF of 60-65% with grade 1 diastolic dysfunction. Patient is not on diuretics at home. CHF is compensated. No leg edema. - Continue aspirin and metoprolol -echocardiogram pending  Code Status:  DO NOT RESUSCITATE Family Communication: none (indicate person spoken  with, relationship, and if by phone, the number) Disposition Plan:    Consultants:    Procedures:    Cultures 8/4 urine positive AEROCOCCUS URINAE 8/4 blood right arm/left hand NGTD   Antibiotics:   (indicate start date, and stop date if known)  DVT prophylaxis     Objective: Filed Vitals:   05/26/15 2042 05/27/15 0009 05/27/15 0610 05/27/15 1615  BP: 184/66 167/71 165/59 151/47  Pulse: 92 92 70 77  Temp: 98.7 F (37.1 C) 98.4 F (36.9 C) 98.7 F (37.1 C) 98.2 F (36.8 C)  TempSrc: Oral Oral Oral Oral  Resp: 18 17 18    Height:      Weight:   58.832 kg (129 lb 11.2 oz)   SpO2: 99% 100% 99% 99%    Intake/Output Summary (Last 24 hours) at 05/27/15 2125 Last data filed at 05/27/15 1732  Gross per 24 hour  Intake    770 ml  Output      0 ml  Net    770 ml   Filed Weights   05/25/15 2348 05/26/15 0607 05/27/15 0610  Weight: 57.153 kg (126 lb) 58.333 kg (128 lb 9.6 oz) 58.832 kg (129 lb 11.2 oz)     Exam: General: A/O 4 (patient does believe that there was a [place] party in her room last night), NAD, No acute respiratory distress Eyes: Negative headache, eye pain, double vision,negative scleral hemorrhage ENT: Negative Runny nose, negative ear pain, negative tinnitus, negative gingival bleeding Neck:  Negative scars, masses, torticollis, lymphadenopathy, JVD Lungs: Clear to auscultation bilaterally without wheezes or crackles Cardiovascular: Regular rate and rhythm, frequent PVC, positive systolic murmur grade 2/6, negative gallop or rub normal S1 and S2 Abdomen:negative abdominal pain, negative dysphagia, nondistended, positive soft, bowel sounds, no rebound, no ascites, no appreciable mass, negative CVA tenderness Extremities: No significant cyanosis, clubbing, or edema bilateral lower extremities Psychiatric:  Negative depression, negative anxiety, negative fatigue, negative mania Neurologic:  Cranial nerves II through XII intact, tongue/uvula midline,  all extremities muscle strength 5/5, sensation intact throughout, negative dysarthria, negative expressive aphasia, negative receptive aphasia.    Data Reviewed: Basic Metabolic Panel:  Recent Labs Lab 05/25/15 1743 05/26/15 0452 05/27/15 0327  NA 128* 128* 126*  K 4.3 4.5 4.3  CL 94* 93* 92*  CO2 23 28 21*  GLUCOSE 101* 112* 101*  BUN 20 15 12   CREATININE 1.07* 1.08* 1.01*  CALCIUM 9.2 8.9 9.0   Liver Function Tests:  Recent Labs Lab 05/25/15 1743  AST 24  ALT 18  ALKPHOS 110  BILITOT 0.8  PROT 7.0  ALBUMIN 3.2*    Recent Labs Lab 05/25/15 1743  LIPASE 35   No results for input(s): AMMONIA in the last 168 hours. CBC:  Recent Labs Lab 05/25/15 1743 05/26/15 0452 05/27/15 0327  WBC 4.6 5.2 5.9  NEUTROABS 3.4  --   --   HGB 17.6* 16.6* 17.5*  HCT 48.8* 46.9* 48.6*  MCV 88.1 88.3 88.5  PLT 375 416* 406*   Cardiac Enzymes: No results for input(s): CKTOTAL, CKMB, CKMBINDEX, TROPONINI in the last 168 hours. BNP (last 3 results)  Recent Labs  04/07/15 1130 05/25/15 2221  BNP 306.0* 154.7*    ProBNP (last 3 results)  Recent Labs  08/03/14 1643  PROBNP 181.0*    CBG:  Recent Labs Lab 05/26/15 0811 05/27/15 0606  GLUCAP 81 95    Recent Results (from the past 240  hour(s))  Urine culture     Status: None   Collection Time: 05/25/15  7:06 PM  Result Value Ref Range Status   Specimen Description URINE, CLEAN CATCH  Final   Special Requests NONE  Final   Culture >=100,000 COLONIES/mL AEROCOCCUS URINAE  Final   Report Status 05/27/2015 FINAL  Final  Culture, blood (routine x 2)     Status: None (Preliminary result)   Collection Time: 05/25/15 10:27 PM  Result Value Ref Range Status   Specimen Description BLOOD RIGHT ARM  Final   Special Requests   Final    BOTTLES DRAWN AEROBIC AND ANAEROBIC [place] BLUE 3CC RED   Culture NO GROWTH 1 DAY  Final   Report Status PENDING  Incomplete  Culture, blood (routine x 2)     Status: None (Preliminary  result)   Collection Time: 05/26/15  2:02 AM  Result Value Ref Range Status   Specimen Description BLOOD LEFT HAND  Final   Special Requests BOTTLES DRAWN AEROBIC ONLY 1CC  Final   Culture NO GROWTH 1 DAY  Final   Report Status PENDING  Incomplete     Studies: Dg Hip Unilat With Pelvis 2-3 Views Right  05/26/2015   CLINICAL DATA:  Right hip pain for several weeks.  EXAM: DG HIP (WITH OR WITHOUT PELVIS) 2-3V RIGHT  COMPARISON:  CT 04/01/2014  FINDINGS: Degenerative changes lumbar spine and both hips. Diffuse osteopenia. No acute bony abnormality.  IMPRESSION: Diffuse osteopenia and degenerative change.  No acute abnormality.   Electronically Signed   By: [place]   On: 05/26/2015 08:46    Scheduled Meds: . aspirin EC  81 mg Oral Daily  . [START ON 05/29/2015] atorvastatin  80 mg Oral Q Mon-1800  . calcium-vitamin D  1 tablet Oral BID  . cefTRIAXone (ROCEPHIN)  IV  1 g Intravenous Q24H  . feeding supplement (ENSURE ENLIVE)  237 mL Oral BID BM  . ferrous sulfate  325 mg Oral Q breakfast  . heparin  5,000 Units Subcutaneous 3 times per day  . hydrALAZINE  10 mg Oral 3 times per day  . irbesartan  150 mg Oral Daily  . labetalol  5 mg Intravenous Once  . metoprolol tartrate  25 mg Oral BID  . multivitamin with minerals  1 tablet Oral Daily  . pantoprazole  40 mg Oral Daily  . sodium chloride  1,000 mL Intravenous Once  . sodium chloride  3 mL Intravenous Q12H   Continuous Infusions: . sodium chloride 100 mL/hr at 05/27/15 1634    Principal Problem:   UTI (lower urinary tract infection) Active Problems:   Malignant carcinoid tumor of unknown primary site   Essential hypertension   Allergic rhinitis   GERD   Spinal stenosis of lumbar region at multiple levels   CAD (coronary artery disease)  s/p  PCI for NSTEMI. with angina.    Hyperlipidemia   DNR/DNI 07/21/14   Anxiety state   Hyponatremia   Chronic diastolic heart failure   Acute encephalopathy   Urinary tract  infection   Back pain   Sepsis secondary to UTI   Lactic acidosis   Notalgia   CAD in native artery   HLD (hyperlipidemia)   Chronic diastolic CHF (congestive heart failure)    Time spent:35 minutes    Amad Mau, [place] [facility] Pager [phone]. If 7PM-7AM, please contact night-coverage at [email or web address], password [facility] 05/27/2015, 9:25 PM  LOS: 2 days    Care during the  described time interval was provided by me .  I have reviewed this patient's available data, including medical history, events of note, physical examination, and all test results as part of my evaluation. I have personally reviewed and interpreted all radiology studies.   Dia Crawford, MD [phone] Pager

## 2015-05-27 NOTE — Progress Notes (Signed)
Patient has refused labs to be drawn for lactic acid, MD made awae

## 2015-05-27 NOTE — Progress Notes (Signed)
   05/27/15 0009  Vitals  Temp 98.4 F (36.9 C)  Temp Source Oral  BP (!) 167/71 mmHg  BP Location Right Arm  BP Method Automatic  Patient Position (if appropriate) Lying  Pulse Rate 92  Pulse Rate Source Dinamap  Resp 17  Oxygen Therapy  SpO2 100 %  O2 Device Room Air  CCMD informed RN that at 23:44 pt was in 2nd degree HB 2, pt c/o nausea & vomiting. EKG done showing NSR, [place] NP notified, phenergan 6.25mg  IV ordered and given. Will continue to monitor.

## 2015-05-27 NOTE — Progress Notes (Signed)
CRITICAL VALUE ALERT  Critical value received: Lactic Acid 2.2  Date of notification:  05/27/15  Time of notification:  1140  Critical value read back yes  Nurse who received alert:  Honor Loh RN  MD notified (1st page):  Dr Sherral Hammers via text page

## 2015-05-27 NOTE — Progress Notes (Signed)
CRITICAL VALUE ALERT  Critical value received:  Lactic Acid  Date of notification:  05/27/15  Time of notification:  0620  Critical value read back yes  Nurse who received alert:  Jerral Bonito  MD notified (1st page):  Dr Sherral Hammers Via text page

## 2015-05-28 DIAGNOSIS — T17908A Unspecified foreign body in respiratory tract, part unspecified causing other injury, initial encounter: Secondary | ICD-10-CM | POA: Diagnosis present

## 2015-05-28 DIAGNOSIS — T17998A Other foreign object in respiratory tract, part unspecified causing other injury, initial encounter: Secondary | ICD-10-CM

## 2015-05-28 LAB — CBC WITH DIFFERENTIAL/PLATELET
BASOS PCT: 0 % (ref 0–1)
Basophils Absolute: 0 10*3/uL (ref 0.0–0.1)
Eosinophils Absolute: 0.1 10*3/uL (ref 0.0–0.7)
Eosinophils Relative: 1 % (ref 0–5)
HEMATOCRIT: 45.3 % (ref 36.0–46.0)
HEMOGLOBIN: 16.2 g/dL — AB (ref 12.0–15.0)
Lymphocytes Relative: 16 % (ref 12–46)
Lymphs Abs: 0.8 10*3/uL (ref 0.7–4.0)
MCH: 31.6 pg (ref 26.0–34.0)
MCHC: 35.8 g/dL (ref 30.0–36.0)
MCV: 88.3 fL (ref 78.0–100.0)
MONO ABS: 0.6 10*3/uL (ref 0.1–1.0)
Monocytes Relative: 12 % (ref 3–12)
NEUTROS ABS: 3.6 10*3/uL (ref 1.7–7.7)
NEUTROS PCT: 71 % (ref 43–77)
Platelets: 357 10*3/uL (ref 150–400)
RBC: 5.13 MIL/uL — ABNORMAL HIGH (ref 3.87–5.11)
RDW: 16.7 % — ABNORMAL HIGH (ref 11.5–15.5)
WBC: 5.2 10*3/uL (ref 4.0–10.5)

## 2015-05-28 LAB — COMPREHENSIVE METABOLIC PANEL
ALT: 14 U/L (ref 14–54)
ANION GAP: 5 (ref 5–15)
AST: 23 U/L (ref 15–41)
Albumin: 2.3 g/dL — ABNORMAL LOW (ref 3.5–5.0)
Alkaline Phosphatase: 84 U/L (ref 38–126)
BUN: 16 mg/dL (ref 6–20)
CO2: 23 mmol/L (ref 22–32)
Calcium: 7.5 mg/dL — ABNORMAL LOW (ref 8.9–10.3)
Chloride: 104 mmol/L (ref 101–111)
Creatinine, Ser: 0.94 mg/dL (ref 0.44–1.00)
GFR calc Af Amer: 59 mL/min — ABNORMAL LOW (ref >60)
GFR calc non Af Amer: 51 mL/min — ABNORMAL LOW (ref >60)
Glucose, Bld: 83 mg/dL (ref 65–99)
POTASSIUM: 3.5 mmol/L (ref 3.5–5.1)
Sodium: 132 mmol/L — ABNORMAL LOW (ref 135–145)
TOTAL PROTEIN: 5.2 g/dL — AB (ref 6.5–8.1)
Total Bilirubin: 1.2 mg/dL (ref 0.3–1.2)

## 2015-05-28 LAB — OSMOLALITY: Osmolality: 278 mOsm/kg (ref 275–300)

## 2015-05-28 LAB — MAGNESIUM: Magnesium: 1.4 mg/dL — ABNORMAL LOW (ref 1.7–2.4)

## 2015-05-28 LAB — GLUCOSE, CAPILLARY: GLUCOSE-CAPILLARY: 87 mg/dL (ref 65–99)

## 2015-05-28 LAB — LACTIC ACID, PLASMA
LACTIC ACID, VENOUS: 1 mmol/L (ref 0.5–2.0)
LACTIC ACID, VENOUS: 0.8 mmol/L (ref 0.5–2.0)

## 2015-05-28 NOTE — Progress Notes (Signed)
[facility] PROGRESS NOTE  Becky Morales MEQ:[id number] DOB: [birth year] DOA: 05/25/2015 PCP: Garret Reddish, MD HPI/Subjective: 81 y.o. BF PMHx of hypertension, hyperlipidemia, GERD, CAD (S/P stent placement to 2012), carcinoid tumor (not know the detail), HTN,Diastolic congestive heart failure, CAD native artery, murmur 11/12 LVEF =55-60%, hyponatremia.  Presents with increased urinary frequency and altered mental status. Patient is accompanied by her niece. Per her niece, she was found in her bed and was unable to get woken up by her niece. Patient seems to be more confused than her baseline. Patient reports increased urinary frequency recently, but no dysuria or burning on urination. No nausea, vomiting, abdominal pain, diarrhea. She has pain to her right low back for the past week. No unilateral weakness, numbness, tingling sensations, urinary incontinence or loss of control for bowel movement. She denies any falls or injuries. Patient does not have fever, chills, chest pain, cough, shortness of breath, leg edema, rashes. When I saw patient in ED, she looks tired, her mental status already improved per her niece.   In ED, patient was found to have positive urinalysis for UTI, lipase 75, temperature normal, no tachycardia, hyponatremia sodium 128, creatinine 1.07. Chest x-ray negative for acute issues. CT head is negative for acute abnormalities. X-ray of lumbar spine is negative for acute abnormalities, but showed mild left convex thoracolumbar scoliosis and mild underlying degenerative change. CT per renal stone protocol is negative for kidney stone, but showed gallstone without cholecystitis. Patient is admitted to inpatient for further evaluation and treatment.  8/7 A/O 4, NAD, negative N/V, negative SOB, negative CP except when having coughing spells postprandial, negative abdominal pain, negative CVA tenderness.   Assessment/Plan: UTI (lower urinary tract infection)/:Sepsis? - Although  patient does not meet guidelines for sepsis, with a lactic acidosis in a 81 year old will treat sepsis, patient may not express fever, leukocytosis. -  positive AEROCOCCUS URINAEI. Literature review shows that not only can this bacteria cause UTI but in some cases may cause endocarditis. Consult infectious disease in the a.m. -Echocardiogram pending - continue Ceftriaxone IV  Lactic acidosis -Resolved with hydration and antibiotics -Continue normal saline at 75 ml/hr; by mouth intake still extremely light.  Acute encephalopathy:  -Resolved  -Hyponatremia:  - This seems to be chronic issue. Na was 130on 04/06/15, slightly worsening.  - Likely due to decreased oral intake, continue IVF -Review of EMR does not show medication which should cause hyponatremia -Obtain urine osmolality, serum osmolality, urine sodium, uric acid (SIADH?) -Improving with hydration  Essential hypertension:  -Hydralazine 10 mg TID - IV hydralazine PRN  - Irbesartan 150 mg daily -Metoprolol 25 mg BID,  GERD: - Protonix  Aspiration? -Patient is complaining of postprandial coughing, staff has noticed that patient has been coughing after each meal. Consult speech for swallow study  Back pain / hip:  - X-ray of lumbar has no acute bony abnormalities. Patient is known to have spinal stenosis of lumbar region at multiple levels, no alarming symptoms, such as leg weakness or urinary incontinence. - When necessary Percocet and morphine - Hip film; degenerative changes, osteopenia  CAD (coronary artery disease)in native artery: - No chest pain.  - Aspirin, metoprolol, when necessary nitroglycerin SL  HLD:  -Last LDL was 64 on 10/05/14. - Continue Lipitor 80 mg daily  Chronic diastolic heart failure:  - 2-D echo on 02/05/13 showed EF of 60-65% with grade 1 diastolic dysfunction. Patient is not on diuretics at home. CHF is compensated. No leg edema. - Continue aspirin and metoprolol -  echocardiogram  pending     Code Status:  DO NOT RESUSCITATE Family Communication: none (indicate person spoken with, relationship, and if by phone, the number) Disposition Plan:    Consultants:    Procedures:    Cultures 8/4 urine positive AEROCOCCUS URINAE 8/4 blood right arm/left hand NGTD   Antibiotics: Ceftriaxone 8/4>>  DVT prophylaxis Subcutaneous heparin    Objective: Filed Vitals:   05/27/15 2205 05/28/15 0551 05/28/15 0656 05/28/15 1437  BP: 170/63  184/59 145/44  Pulse: 85 62  89  Temp: 98.4 F (36.9 C) 98.2 F (36.8 C)  97.5 F (36.4 C)  TempSrc: Oral Oral  Oral  Resp: 18 18  18   Height:      Weight:  55.656 kg (122 lb 11.2 oz)    SpO2: 98% 95%  96%    Intake/Output Summary (Last 24 hours) at 05/28/15 1830 Last data filed at 05/28/15 1300  Gross per 24 hour  Intake   1830 ml  Output      0 ml  Net   1830 ml   Filed Weights   05/26/15 0607 05/27/15 0610 05/28/15 0551  Weight: 58.333 kg (128 lb 9.6 oz) 58.832 kg (129 lb 11.2 oz) 55.656 kg (122 lb 11.2 oz)     Exam: General: A/O 4, NAD, No acute respiratory distress Eyes: Negative headache, eye pain, double vision,negative scleral hemorrhage ENT: Negative Runny nose, negative ear pain, negative tinnitus, negative gingival bleeding Neck:  Negative scars, masses, torticollis, lymphadenopathy, JVD Lungs: Clear to auscultation bilaterally without wheezes or crackles Cardiovascular: Regular rate and rhythm, frequent PVC, positive systolic murmur grade 2/6, negative gallop or rub normal S1 and S2 Abdomen:negative abdominal pain, negative dysphagia, nondistended, positive soft, bowel sounds, no rebound, no ascites, no appreciable mass, negative CVA tenderness Extremities: No significant cyanosis, clubbing, or edema bilateral lower extremities Psychiatric:  Negative depression, negative anxiety, negative fatigue, negative mania Neurologic:  Cranial nerves II through XII intact, tongue/uvula midline, all  extremities muscle strength 5/5, sensation intact throughout, negative dysarthria, negative expressive aphasia, negative receptive aphasia.    Data Reviewed: Basic Metabolic Panel:  Recent Labs Lab 05/25/15 1743 05/26/15 0452 05/27/15 0327 05/28/15 0543  NA 128* 128* 126* 132*  K 4.3 4.5 4.3 3.5  CL 94* 93* 92* 104  CO2 23 28 21* 23  GLUCOSE 101* 112* 101* 83  BUN 20 15 12 16   CREATININE 1.07* 1.08* 1.01* 0.94  CALCIUM 9.2 8.9 9.0 7.5*  MG  --   --   --  1.4*   Liver Function Tests:  Recent Labs Lab 05/25/15 1743 05/28/15 0543  AST 24 23  ALT 18 14  ALKPHOS 110 84  BILITOT 0.8 1.2  PROT 7.0 5.2*  ALBUMIN 3.2* 2.3*    Recent Labs Lab 05/25/15 1743  LIPASE 35   No results for input(s): AMMONIA in the last 168 hours. CBC:  Recent Labs Lab 05/25/15 1743 05/26/15 0452 05/27/15 0327 05/28/15 0543  WBC 4.6 5.2 5.9 5.2  NEUTROABS 3.4  --   --  3.6  HGB 17.6* 16.6* 17.5* 16.2*  HCT 48.8* 46.9* 48.6* 45.3  MCV 88.1 88.3 88.5 88.3  PLT 375 416* 406* 357   Cardiac Enzymes: No results for input(s): CKTOTAL, CKMB, CKMBINDEX, TROPONINI in the last 168 hours. BNP (last 3 results)  Recent Labs  04/07/15 1130 05/25/15 2221  BNP 306.0* 154.7*    ProBNP (last 3 results)  Recent Labs  08/03/14 1643  PROBNP 181.0*    CBG:  Recent Labs Lab 05/26/15 0811 05/27/15 0606 05/28/15 0542  GLUCAP 81 95 87    Recent Results (from the past 240 hour(s))  Urine culture     Status: None   Collection Time: 05/25/15  7:06 PM  Result Value Ref Range Status   Specimen Description URINE, CLEAN CATCH  Final   Special Requests NONE  Final   Culture >=100,000 COLONIES/mL AEROCOCCUS URINAE  Final   Report Status 05/27/2015 FINAL  Final  Culture, blood (routine x 2)     Status: None (Preliminary result)   Collection Time: 05/25/15 10:27 PM  Result Value Ref Range Status   Specimen Description BLOOD RIGHT ARM  Final   Special Requests   Final    BOTTLES DRAWN  AEROBIC AND ANAEROBIC [place] BLUE 3CC RED   Culture NO GROWTH 2 DAYS  Final   Report Status PENDING  Incomplete  Culture, blood (routine x 2)     Status: None (Preliminary result)   Collection Time: 05/26/15  2:02 AM  Result Value Ref Range Status   Specimen Description BLOOD LEFT HAND  Final   Special Requests BOTTLES DRAWN AEROBIC ONLY 1CC  Final   Culture NO GROWTH 2 DAYS  Final   Report Status PENDING  Incomplete     Studies: No results found.  Scheduled Meds: . aspirin EC  81 mg Oral Daily  . [START ON 05/29/2015] atorvastatin  80 mg Oral Q Mon-1800  . calcium-vitamin D  1 tablet Oral BID  . cefTRIAXone (ROCEPHIN)  IV  1 g Intravenous Q24H  . feeding supplement (ENSURE ENLIVE)  237 mL Oral BID BM  . ferrous sulfate  325 mg Oral Q breakfast  . heparin  5,000 Units Subcutaneous 3 times per day  . hydrALAZINE  10 mg Oral 3 times per day  . irbesartan  150 mg Oral Daily  . labetalol  5 mg Intravenous Once  . metoprolol tartrate  25 mg Oral BID  . multivitamin with minerals  1 tablet Oral Daily  . pantoprazole  40 mg Oral Daily  . sodium chloride  3 mL Intravenous Q12H   Continuous Infusions: . sodium chloride 100 mL/hr at 05/27/15 2219    Principal Problem:   UTI (lower urinary tract infection) Active Problems:   Malignant carcinoid tumor of unknown primary site   Essential hypertension   Allergic rhinitis   GERD   Spinal stenosis of lumbar region at multiple levels   CAD (coronary artery disease)  s/p  PCI for NSTEMI. with angina.    Hyperlipidemia   DNR/DNI 07/21/14   Anxiety state   Hyponatremia   Chronic diastolic heart failure   Acute encephalopathy   Urinary tract infection   Back pain   Sepsis secondary to UTI   Lactic acidosis   Notalgia   CAD in native artery   HLD (hyperlipidemia)   Chronic diastolic CHF (congestive heart failure)   Aspiration into airway    Time spent:35 minutes    WOODS, [place] [facility] Pager [phone]. If  7PM-7AM, please contact night-coverage at [email or web address], password [facility] 05/28/2015, 6:30 PM  LOS: 3 days    Care during the described time interval was provided by me .  I have reviewed this patient's available data, including medical history, events of note, physical examination, and all test results as part of my evaluation. I have personally reviewed and interpreted all radiology studies.   Dia Crawford, MD [phone] Pager

## 2015-05-29 ENCOUNTER — Inpatient Hospital Stay (HOSPITAL_COMMUNITY): Payer: Medicare Other

## 2015-05-29 DIAGNOSIS — I509 Heart failure, unspecified: Secondary | ICD-10-CM

## 2015-05-29 LAB — COMPREHENSIVE METABOLIC PANEL WITH GFR
ALT: 18 U/L (ref 14–54)
AST: 28 U/L (ref 15–41)
Albumin: 2.8 g/dL — ABNORMAL LOW (ref 3.5–5.0)
Alkaline Phosphatase: 106 U/L (ref 38–126)
Anion gap: 7 (ref 5–15)
BUN: 19 mg/dL (ref 6–20)
CO2: 26 mmol/L (ref 22–32)
Calcium: 9 mg/dL (ref 8.9–10.3)
Chloride: 97 mmol/L — ABNORMAL LOW (ref 101–111)
Creatinine, Ser: 1.1 mg/dL — ABNORMAL HIGH (ref 0.44–1.00)
GFR calc Af Amer: 49 mL/min — ABNORMAL LOW (ref >60)
GFR calc non Af Amer: 42 mL/min — ABNORMAL LOW (ref >60)
Glucose, Bld: 108 mg/dL — ABNORMAL HIGH (ref 65–99)
Potassium: 5.6 mmol/L — ABNORMAL HIGH (ref 3.5–5.1)
Sodium: 130 mmol/L — ABNORMAL LOW (ref 135–145)
Total Bilirubin: 0.9 mg/dL (ref 0.3–1.2)
Total Protein: 7.1 g/dL (ref 6.5–8.1)

## 2015-05-29 LAB — CBC WITH DIFFERENTIAL/PLATELET
BASOS ABS: 0 10*3/uL (ref 0.0–0.1)
Basophils Relative: 0 % (ref 0–1)
EOS ABS: 0.1 10*3/uL (ref 0.0–0.7)
EOS PCT: 2 % (ref 0–5)
HCT: 50.8 % — ABNORMAL HIGH (ref 36.0–46.0)
Hemoglobin: 18 g/dL — ABNORMAL HIGH (ref 12.0–15.0)
LYMPHS PCT: 13 % (ref 12–46)
Lymphs Abs: 0.9 10*3/uL (ref 0.7–4.0)
MCH: 32.2 pg (ref 26.0–34.0)
MCHC: 35.4 g/dL (ref 30.0–36.0)
MCV: 90.9 fL (ref 78.0–100.0)
MONOS PCT: 16 % — AB (ref 3–12)
Monocytes Absolute: 1.1 10*3/uL — ABNORMAL HIGH (ref 0.1–1.0)
NEUTROS ABS: 4.7 10*3/uL (ref 1.7–7.7)
Neutrophils Relative %: 69 % (ref 43–77)
Platelets: 390 10*3/uL (ref 150–400)
RBC: 5.59 MIL/uL — ABNORMAL HIGH (ref 3.87–5.11)
RDW: 17.5 % — AB (ref 11.5–15.5)
WBC: 6.8 10*3/uL (ref 4.0–10.5)

## 2015-05-29 LAB — MAGNESIUM: Magnesium: 1.7 mg/dL (ref 1.7–2.4)

## 2015-05-29 LAB — GLUCOSE, CAPILLARY: Glucose-Capillary: 107 mg/dL — ABNORMAL HIGH (ref 65–99)

## 2015-05-29 NOTE — Progress Notes (Signed)
[facility] PROGRESS NOTE  Becky Morales IRJ:[id number] DOB: January 07, 1920 DOA: 05/25/2015 PCP: Garret Reddish, MD HPI/Subjective: 81 y.o. BF PMHx of hypertension, hyperlipidemia, GERD, CAD (S/P stent placement to 2012), carcinoid tumor (not know the detail), HTN,Diastolic congestive heart failure, CAD native artery, murmur 11/12 LVEF =55-60%, hyponatremia.  Presents with increased urinary frequency and altered mental status. Patient is accompanied by her niece. Per her niece, she was found in her bed and was unable to get woken up by her niece. Patient seems to be more confused than her baseline. Patient reports increased urinary frequency recently, but no dysuria or burning on urination. No nausea, vomiting, abdominal pain, diarrhea. She has pain to her right low back for the past week. No unilateral weakness, numbness, tingling sensations, urinary incontinence or loss of control for bowel movement. She denies any falls or injuries. Patient does not have fever, chills, chest pain, cough, shortness of breath, leg edema, rashes. When I saw patient in ED, she looks tired, her mental status already improved per her niece.   In ED, patient was found to have positive urinalysis for UTI, lipase 75, temperature normal, no tachycardia, hyponatremia sodium 128, creatinine 1.07. Chest x-ray negative for acute issues. CT head is negative for acute abnormalities. X-ray of lumbar spine is negative for acute abnormalities, but showed mild left convex thoracolumbar scoliosis and mild underlying degenerative change. CT per renal stone protocol is negative for kidney stone, but showed gallstone without cholecystitis. Patient is admitted to inpatient for further evaluation and treatment.  8/8 A/O 4, NAD, negative N/V, negative SOB, negative CP except when having coughing spells postprandial, negative abdominal pain, negative CVA tenderness.   Assessment/Plan: UTI (lower urinary tract infection)/:Sepsis? - Although  patient does not meet guidelines for sepsis, with a lactic acidosis in a 81 year old will treat sepsis, patient may not express fever, leukocytosis. -  positive AEROCOCCUS URINAEI. Literature review shows that not only can this bacteria cause UTI but in some cases may cause endocarditis. Consult infectious disease in the a.m. -Echocardiogram; no significant decline when compared to echocardiogram from 2014 see results below - Ceftriaxone IV will be completed on 8/9 -PT/OT recommends SNF; contact [name] in a.m. to be in process  Lactic acidosis -Resolved with hydration and antibiotics  Acute encephalopathy:  -Resolved  -Hyponatremia:  - This seems to be chronic issue. Na was 130on 04/06/15, slightly worsening.  - Likely due to decreased oral intake, continue IVF -Review of EMR does not show medication which should cause hyponatremia -Obtain urine osmolality, serum osmolality, urine sodium, uric acid (SIADH?); Labs pending  Essential hypertension:  -Hydralazine 10 mg TID - IV hydralazine PRN  - Irbesartan 150 mg daily -Metoprolol 25 mg BID, -BP well controlled  GERD: - Protonix  Aspiration? -Patient is complaining of postprandial coughing, staff has noticed that patient has been coughing after each meal. Consult speech for swallow study  Back pain / hip:  - X-ray of lumbar has no acute bony abnormalities. Patient is known to have spinal stenosis of lumbar region at multiple levels, no alarming symptoms, such as leg weakness or urinary incontinence. - When necessary Percocet and morphine - Hip film; degenerative changes, osteopenia  CAD (coronary artery disease)in native artery: - No chest pain.  - Aspirin, metoprolol, when necessary nitroglycerin SL  HLD:  -Last LDL was 64 on 10/05/14. - Continue Lipitor 80 mg daily  Chronic diastolic heart failure:  - 2-D echo on 02/05/13 showed EF of 60-65% with grade 1 diastolic dysfunction. Patient is  not on diuretics at home. CHF is  compensated. No leg edema. - Continue aspirin and metoprolol -echocardiogram pending     Code Status:  DO NOT RESUSCITATE Family Communication: none (indicate person spoken with, relationship, and if by phone, the number) Disposition Plan:    Consultants:    Procedures: 8/8 echocardiogram;- Left ventricle: moderate concentric hypertrophy.-LVEF= 55% to 60%. - (grade 1 diastolic dysfunction).- Aortic valve: mild to moderate regurgitation.  8/8 PCXR;Pulmonary vascular prominence most notable centrally without change.    Cultures 8/4 urine positive AEROCOCCUS URINAE 8/4 blood right arm/left hand NGTD   Antibiotics: Ceftriaxone 8/4>> stopped 8/9     DVT prophylaxis Subcutaneous heparin    Objective: Filed Vitals:   05/28/15 0656 05/28/15 1437 05/28/15 2143 05/29/15 0510  BP: 184/59 145/44 143/64 176/56  Pulse:  89 72 80  Temp:  97.5 F (36.4 C) 98.5 F (36.9 C) 98.7 F (37.1 C)  TempSrc:  Oral Oral Oral  Resp:  18 18 18   Height:      Weight:    61.417 kg (135 lb 6.4 oz)  SpO2:  96% 96% 99%    Intake/Output Summary (Last 24 hours) at 05/29/15 0803 Last data filed at 05/29/15 0700  Gross per 24 hour  Intake 3111.67 ml  Output    301 ml  Net 2810.67 ml   Filed Weights   05/27/15 0610 05/28/15 0551 05/29/15 0510  Weight: 58.832 kg (129 lb 11.2 oz) 55.656 kg (122 lb 11.2 oz) 61.417 kg (135 lb 6.4 oz)     Exam: General: A/O 4, NAD, No acute respiratory distress Eyes: Negative headache, eye pain, double vision,negative scleral hemorrhage ENT: Negative Runny nose, negative ear pain, negative tinnitus, negative gingival bleeding Neck:  Negative scars, masses, torticollis, lymphadenopathy, JVD Lungs: Clear to auscultation bilaterally without wheezes or crackles Cardiovascular: Regular rate and rhythm, frequent PVC, positive systolic murmur grade 2/6, negative gallop or rub normal S1 and S2 Abdomen:negative abdominal pain, negative dysphagia, nondistended,  positive soft, bowel sounds, no rebound, no ascites, no appreciable mass, negative CVA tenderness Extremities: No significant cyanosis, clubbing, or edema bilateral lower extremities Psychiatric:  Negative depression, negative anxiety, negative fatigue, negative mania Neurologic:  Cranial nerves II through XII intact, tongue/uvula midline, all extremities muscle strength 5/5, sensation intact throughout, negative dysarthria, negative expressive aphasia, negative receptive aphasia.    Data Reviewed: Basic Metabolic Panel:  Recent Labs Lab 05/25/15 1743 05/26/15 0452 05/27/15 0327 05/28/15 0543 05/29/15 0419  NA 128* 128* 126* 132* 130*  K 4.3 4.5 4.3 3.5 5.6*  CL 94* 93* 92* 104 97*  CO2 23 28 21* 23 26  GLUCOSE 101* 112* 101* 83 108*  BUN 20 15 12 16 19   CREATININE 1.07* 1.08* 1.01* 0.94 1.10*  CALCIUM 9.2 8.9 9.0 7.5* 9.0  MG  --   --   --  1.4* 1.7   Liver Function Tests:  Recent Labs Lab 05/25/15 1743 05/28/15 0543 05/29/15 0419  AST 24 23 28   ALT 18 14 18   ALKPHOS 110 84 106  BILITOT 0.8 1.2 0.9  PROT 7.0 5.2* 7.1  ALBUMIN 3.2* 2.3* 2.8*    Recent Labs Lab 05/25/15 1743  LIPASE 35   No results for input(s): AMMONIA in the last 168 hours. CBC:  Recent Labs Lab 05/25/15 1743 05/26/15 0452 05/27/15 0327 05/28/15 0543 05/29/15 0419  WBC 4.6 5.2 5.9 5.2 6.8  NEUTROABS 3.4  --   --  3.6 4.7  HGB 17.6* 16.6* 17.5* 16.2* 18.0*  HCT  48.8* 46.9* 48.6* 45.3 50.8*  MCV 88.1 88.3 88.5 88.3 90.9  PLT 375 416* 406* 357 390   Cardiac Enzymes: No results for input(s): CKTOTAL, CKMB, CKMBINDEX, TROPONINI in the last 168 hours. BNP (last 3 results)  Recent Labs  04/07/15 1130 05/25/15 2221  BNP 306.0* 154.7*    ProBNP (last 3 results)  Recent Labs  08/03/14 1643  PROBNP 181.0*    CBG:  Recent Labs Lab 05/26/15 0811 05/27/15 0606 05/28/15 0542 05/29/15 0636  GLUCAP 81 95 87 107*    Recent Results (from the past 240 hour(s))  Urine culture      Status: None   Collection Time: 05/25/15  7:06 PM  Result Value Ref Range Status   Specimen Description URINE, CLEAN CATCH  Final   Special Requests NONE  Final   Culture >=100,000 COLONIES/mL AEROCOCCUS URINAE  Final   Report Status 05/27/2015 FINAL  Final  Culture, blood (routine x 2)     Status: None (Preliminary result)   Collection Time: 05/25/15 10:27 PM  Result Value Ref Range Status   Specimen Description BLOOD RIGHT ARM  Final   Special Requests   Final    BOTTLES DRAWN AEROBIC AND ANAEROBIC [place] BLUE 3CC RED   Culture NO GROWTH 2 DAYS  Final   Report Status PENDING  Incomplete  Culture, blood (routine x 2)     Status: None (Preliminary result)   Collection Time: 05/26/15  2:02 AM  Result Value Ref Range Status   Specimen Description BLOOD LEFT HAND  Final   Special Requests BOTTLES DRAWN AEROBIC ONLY 1CC  Final   Culture NO GROWTH 2 DAYS  Final   Report Status PENDING  Incomplete     Studies: No results found.  Scheduled Meds: . aspirin EC  81 mg Oral Daily  . atorvastatin  80 mg Oral Q Mon-1800  . calcium-vitamin D  1 tablet Oral BID  . cefTRIAXone (ROCEPHIN)  IV  1 g Intravenous Q24H  . feeding supplement (ENSURE ENLIVE)  237 mL Oral BID BM  . ferrous sulfate  325 mg Oral Q breakfast  . heparin  5,000 Units Subcutaneous 3 times per day  . hydrALAZINE  10 mg Oral 3 times per day  . irbesartan  150 mg Oral Daily  . labetalol  5 mg Intravenous Once  . metoprolol tartrate  25 mg Oral BID  . multivitamin with minerals  1 tablet Oral Daily  . pantoprazole  40 mg Oral Daily  . sodium chloride  3 mL Intravenous Q12H   Continuous Infusions: . sodium chloride 75 mL/hr at 05/28/15 2147    Principal Problem:   UTI (lower urinary tract infection) Active Problems:   Malignant carcinoid tumor of unknown primary site   Essential hypertension   Allergic rhinitis   GERD   Spinal stenosis of lumbar region at multiple levels   CAD (coronary artery disease)  s/p  PCI  for NSTEMI. with angina.    Hyperlipidemia   DNR/DNI 07/21/14   Anxiety state   Hyponatremia   Chronic diastolic heart failure   Acute encephalopathy   Urinary tract infection   Back pain   Sepsis secondary to UTI   Lactic acidosis   Notalgia   CAD in native artery   HLD (hyperlipidemia)   Chronic diastolic CHF (congestive heart failure)   Aspiration into airway    Time spent:35 minutes    Meggie Laseter, [facility] Pager [phone]. If 7PM-7AM, please contact night-coverage at  [email or web address], password [facility] 05/29/2015, 8:03 AM  LOS: 4 days    Care during the described time interval was provided by me .  I have reviewed this patient's available data, including medical history, events of note, physical examination, and all test results as part of my evaluation. I have personally reviewed and interpreted all radiology studies.   Dia Crawford, MD [phone] Pager

## 2015-05-29 NOTE — Progress Notes (Signed)
Occupational Therapy Treatment Patient Details Name: Becky Morales MRN: [id number] DOB: 1919-12-05 Today's Date: 05/29/2015    History of present illness Pt is is a 81 y.o. female with PMH of hypertension, hyperlipidemia, GERD, CAD (S/B stent placement to 2012), carcinoid tumor (not know the detail), diastolic congestive heart failure, who presents with increased urinary frequency and altered mental status.   OT comments  Pt had just vomited just prior to OTs arrival limiting session to EOB ADL. Pt remains dependent in ADL and needs assist for mobility.  She is unsafe to return home with the limited amount of assistance she has.  Continue to recommend SNF.  Follow Up Recommendations  SNF;Supervision/Assistance - 24 hour    Equipment Recommendations  3 in 1 bedside comode    Recommendations for Other Services      Precautions / Restrictions Precautions Precautions: Fall Restrictions Weight Bearing Restrictions: No       Mobility Bed Mobility Overal bed mobility: Needs Assistance Bed Mobility: Supine to Sit;Sit to Supine     Supine to sit: Min assist Sit to supine: Min assist      Transfers Overall transfer level: Needs assistance                    Balance   Sitting-balance support: Feet supported Sitting balance-Leahy Scale: Fair                             ADL Overall ADL's : Needs assistance/impaired     Grooming: Wash/dry hands;Wash/dry face;Set up;Sitting           Upper Body Dressing : Minimal assistance;Sitting                     General ADL Comments: Pt had just vomited prior to arrival of OT. Agreeable to sit EOB to rinse out mouth, wash face and hands and change soiled gown.      Vision                     Perception     Praxis      Cognition   Behavior During Therapy: [facility] for tasks assessed/performed Overall Cognitive Status: No family/caregiver present to determine baseline cognitive functioning Area  of Impairment: Safety/judgement          Safety/Judgement: Decreased awareness of deficits     General Comments: Pt thinks she can manage at home alone. Pointed out to pt that she is not ambulating on her own at this point and is quite weak for being able to manage ADL and IADL.    Extremity/Trunk Assessment               Exercises     Shoulder Instructions       General Comments      Pertinent Vitals/ Pain       Pain Assessment: No/denies pain  Home Living                                          Prior Functioning/Environment              Frequency Min 2X/week     Progress Toward Goals  OT Goals(current goals can now be found in the care plan section)  Progress towards OT goals: Not progressing toward goals - comment (  pt with vomiting, not feeling well )  Acute Rehab OT Goals Patient Stated Goal: now refusing SNF, thinks she can manage at home  Plan Discharge plan remains appropriate    Co-evaluation                 End of Session     Activity Tolerance Treatment limited secondary to medical complications (Comment) (nausea)   Patient Left in bed;with call bell/phone within reach   Nurse Communication          Time: 1420-1440 OT Time Calculation (min): 20 min  Charges: OT General Charges $OT Visit: 1 Procedure OT Treatments $Self Care/Home Management : 8-22 mins  Malka So 05/29/2015, 3:45 PM  [phone]

## 2015-05-29 NOTE — Progress Notes (Signed)
  Echocardiogram 2D Echocardiogram has been performed.  Jennette Dubin 05/29/2015, 11:33 AM

## 2015-05-29 NOTE — Progress Notes (Signed)
Paged Dr. Sherral Hammers regarding pt increasing in SOB and complaining of "not feeling good". Orders received.  Will continue to monitor pt.       Maurene Capes RN

## 2015-05-29 NOTE — Evaluation (Signed)
Clinical/Bedside Swallow Evaluation Patient Details  Name: Becky Morales MRN: [id number] Date of Birth: [birth year]  Today's Date: 05/29/2015 Time: SLP Start Time (ACUTE ONLY): 0900 SLP Stop Time (ACUTE ONLY): 0920 SLP Time Calculation (min) (ACUTE ONLY): 20 min  Past Medical History:  Past Medical History  Diagnosis Date  . CARCINOID TUMOR 04/13/2007  . HYPERTENSION 04/13/2007  . ALLERGIC RHINITIS 04/13/2007  . GERD 04/13/2007  . HYPERGLYCEMIA 04/13/2007  . CHOLELITHIASIS 05/02/2010  . Arthritis   . Cancer   . Environmental allergies     has mucus in throat  . Diphtheria     age 19  . CAD (coronary artery disease)     NSTEMI.  LHC 09/03/11: pLAD 40-50%, pDx 80-90% (very small), lateral branch of OM 99%, m-dCFX 40%, oRCA 80%, m-dRCA 50-70%, EF 55%.  She had PCI with BMS to lateral branch of the OM.  RCA was tx medically.   . Hyponatremia   . Murmur     echo 11/12: EF 55-60%, mod calcified AV annulus, mild AI, mild MR  . Dermatophytosis of nail   . UTI (urinary tract infection) 03/2015   Past Surgical History:  Past Surgical History  Procedure Laterality Date  . Tonsillectomy      many years ago  . Cardiac surgery    . Colon surgery      CANCER  . Small bowel repair    . Left heart catheterization with coronary angiogram N/A 09/03/2011    Procedure: LEFT HEART CATHETERIZATION WITH CORONARY ANGIOGRAM;  Surgeon: Peter M Martinique, MD;  Location: [facility] CATH LAB;  Service: Cardiovascular;  Laterality: N/A;  . Percutaneous coronary stent intervention (pci-s)  09/03/2011    Procedure: PERCUTANEOUS CORONARY STENT INTERVENTION (PCI-S);  Surgeon: Peter M Martinique, MD;  Location: [facility] CATH LAB;  Service: Cardiovascular;;   HPI:  Becky Morales is a 81 y.o. female with PMH of hypertension, hyperlipidemia, GERD, CAD (S/B stent placement to 2012), carcinoid tumor (not know the detail), diastolic congestive heart failure, who presents with increased urinary frequency and altered mental status.  Patient is  accompanied by her niece. Per her niece, she was found in her bed and was unable to get woken up by her niece. Patient seems to be more confused than her baseline. Patient reports increased urinary frequency recently, but no dysuria or burning on urination. No nausea, vomiting, abdominal pain, diarrhea. She has pain to her right low back for the past week. No unilateral weakness, numbness, tingling sensations, urinary incontinence or loss of control for bowel movement. She denies any falls or injuries. Patient does not have fever, chills, chest pain, cough, shortness of breath, leg edema, rashes. When I saw patient in ED, she looks tired, her mental status already improved per her niece.  In ED, patient was found to have positive urinalysis for UTI, lipase 75, temperature normal, no tachycardia, hyponatremia sodium 128, creatinine 1.07. Chest x-ray negative for acute issues. CT head is negative for acute abnormalities. X-ray of lumbar spine is negative for acute abnormalities, but showed mild left convex thoracolumbar scoliosis and mild underlying degenerative change. CT per renal stone protocol is negative for kidney stone, but showed gallstone without cholecystitis. Patient is admitted to inpatient for further evaluation and treatment.  Most recent chest x-ray is negative for acute findings.     Assessment / Plan / Recommendation Clinical Impression  Clinical swallowing evaluation was completed.  The patient presented with mild oral/pharyngeal dysphagia characterized by delayed oral transit for dry solids and  immediate cough response given serial sips of thins via straw sips.  Her swallow trigger was timely and hyo-laryngeal excursion appeared adequate.  Current chest x-ray is negative for acute findings.  The patient was noted to belch throughout the evaluation.  Recommend a regular diet and thin liquids.  The patient may use straws but must take one sip at a time (no serial sips).  Alternate food and liquids  and sit up for 30-60 minutes after meals.  Discontinue use of straw if any coughing noted.  Consider MBS if the patient has any changes in her respiratory status.  ST will follow up given complaints and the patient's presentation.      Aspiration Risk  Mild    Diet Recommendation  (Regular and thin liquds.  )   Medication Administration: Whole meds with liquid Compensations: Small sips/bites;Follow solids with liquid    Other  Recommendations Oral Care Recommendations: Oral care BID   Follow Up Recommendations    To be determined.     Frequency and Duration min 2x/week  2 weeks   Pertinent Vitals/Pain Patient is having pain in her chest.  She was taking a pain pill with her breakfast tray.           Swallow Study Prior Functional Status   The patient reports no issues swallowing prior to admission.      General Date of Onset: 05/25/15 Other Pertinent Information: Becky Morales is a 81 y.o. female with PMH of hypertension, hyperlipidemia, GERD, CAD (S/B stent placement to 2012), carcinoid tumor (not know the detail), diastolic congestive heart failure, who presents with increased urinary frequency and altered mental status.  Patient is accompanied by her niece. Per her niece, she was found in her bed and was unable to get woken up by her niece. Patient seems to be more confused than her baseline. Patient reports increased urinary frequency recently, but no dysuria or burning on urination. No nausea, vomiting, abdominal pain, diarrhea. She has pain to her right low back for the past week. No unilateral weakness, numbness, tingling sensations, urinary incontinence or loss of control for bowel movement. She denies any falls or injuries. Patient does not have fever, chills, chest pain, cough, shortness of breath, leg edema, rashes. When I saw patient in ED, she looks tired, her mental status already improved per her niece.  In ED, patient was found to have positive urinalysis for UTI, lipase  75, temperature normal, no tachycardia, hyponatremia sodium 128, creatinine 1.07. Chest x-ray negative for acute issues. CT head is negative for acute abnormalities. X-ray of lumbar spine is negative for acute abnormalities, but showed mild left convex thoracolumbar scoliosis and mild underlying degenerative change. CT per renal stone protocol is negative for kidney stone, but showed gallstone without cholecystitis. Patient is admitted to inpatient for further evaluation and treatment.  Most recent chest x-ray is negative for acute findings.   Type of Study: Bedside swallow evaluation Diet Prior to this Study: Regular;Thin liquids Temperature Spikes Noted: No Respiratory Status: Room air History of Recent Intubation: No Behavior/Cognition: Alert;Cooperative Oral Cavity - Dentition: Missing dentition (Pt wears a partial that is not available.  ) Self-Feeding Abilities: Able to feed self Patient Positioning: Upright in chair/Tumbleform Baseline Vocal Quality: Normal Volitional Cough:  (Not tested due to pain.) Volitional Swallow: Able to elicit    Oral/Motor/Sensory Function Overall Oral Motor/Sensory Function: Appears within functional limits for tasks assessed Labial ROM: Within Functional Limits Labial Symmetry: Within Functional Limits Labial Strength: Within Functional  Limits Lingual ROM: Within Functional Limits Lingual Symmetry: Within Functional Limits Lingual Strength: Within Functional Limits Facial ROM: Within Functional Limits Facial Symmetry: Within Functional Limits Facial Strength: Within Functional Limits Mandible: Within Functional Limits   Ice Chips Ice chips: Not tested   Thin Liquid Thin Liquid: Impaired Presentation: Cup;Self Fed;Straw Pharyngeal  Phase Impairments: Cough - Immediate (given serial straw sips - not seen given 1 sip at at time )    Nectar Thick Nectar Thick Liquid: Not tested   Honey Thick Honey Thick Liquid: Not tested   Puree Puree: Within  functional limits Presentation: Spoon   Solid   GO    Solid: Impaired Presentation: Self Fed Oral Phase Impairments: Impaired mastication Oral Phase Functional Implications:  (delayed oral transit)       Shelly Flatten N 05/29/2015,9:20 AM  Shelly Flatten, [place], [facility] SLP [phone]

## 2015-05-29 NOTE — Care Management Important Message (Signed)
Important Message  Patient Details  Name: Becky Morales MRN: [id number] Date of Birth: [birth year]   Medicare Important Message Given:  Yes-second notification given    Pricilla Handler 05/29/2015, 2:57 PM

## 2015-05-29 NOTE — Clinical Social Work Note (Signed)
Clinical Social Work Assessment  Patient Details  Name: Becky Morales MRN: [id number] Date of Birth: 1919/12/16  Date of referral:  05/29/15               Reason for consult:  Facility Placement                Permission sought to share information with:  Family Supports, Chartered certified accountant granted to share information::  Yes, Verbal Permission Granted  Name::     Becky Morales and cousin- Systems developer), [place]::     Relationship::     Contact Information:     Housing/Transportation Living arrangements for the past 2 months:  Tax adviser of Information:  Patient, Other (Comment Required) Passenger transport manager) Patient Interpreter Needed:  None Criminal Activity/Legal Involvement Pertinent to Current Situation/Hospitalization:  No - Comment as needed Significant Relationships:  Other Family Members Lives with:  Self Do you feel safe going back to the place where you live?  Yes Need for family participation in patient care:  Yes (Comment)  Care giving concerns: Patient lives alone and has limited day/evening support. PT recommends SNF for short term rehab.    Social Worker assessment / plan: [name] met with patient this morning and made multiple attempts this afternoon to talk to patient about PT's recommendation for short term SNF.  Patient was extremely upset this morning because a "Therapist interrupted my breakfast".  Patient became quite fixated on this issue despite [name]'s attempts to reassure her and explain that the Speech Therapist needed to monitor her while she ate to insure that she was swallowing safely.  Patient was unable to relate to this and continued to verbalize that she was extremely distressed.  [name] attempted to re-focus her thoughts on her after care needs. Explained benefits of short term SNF and patient had apparantly mentioned to nursing staff that she would agree to [facility].  After numerous attempts to talk to patient- her final answer  was "NO - I"m going home."  Patient became very defensive with [name] and demanded reassurance that she made her own decisions and that this [name] could not place her "against her will."   [name] agreed to allow her niece Becky Morales to meet and discuss SNF care- however- when niece entered the room- patient asked [name] to leave the room so that they could talk privately.  [name] states she prefers Becky Morales to help her with her after care; she also has a cousin- Becky Morales whom she adamantly demanded that [name] not call- but later agreed for her to be called.  Patient remains adamant that neither Becky Morales nor Becky Morales can make her go to a SNF.  Patient states she has a supportive church and hopes that she can get some help from her congregation. She also agrees to have home health come to her house. Will notify RNCM.    Employment status:  Retired Nurse, adult PT Recommendations:  [place] / Referral to community resources:  [place]  Patient/Family's Response to care:  Patient adamantly disagrees to PT's recommendation for SNF and stated that she also did not agree with her doctor's recommendation for SNF.  She feels that she will manage well at home- however, she will admit that she is weak.    Patient/Family's Understanding of and Emotional Response to Diagnosis, Current Treatment, and Prognosis:  Patient became very animated when [name] attempted to discuss rehab options and home safety concerns. Patient does  not appear to to have a very good understanding of her clinical issues nor her treatment plan. 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  After third attempt to convince patient of benefits of short term SNF- [name] told patient that would come back tomorrow and attempt to talk to Becky Morales. If patient remains adamant about returning home- then will ask MD to order maximum home health including SW services.    Emotional Assessment Appearance:  Appears stated age Attitude/Demeanor/Rapport:  Apprehensive (Calm, apprehensive of SW's visit but cooperative) Affect (typically observed):  Quiet, Calm, Apprehensive Orientation:  Oriented to Self, Oriented to Place, Oriented to  Time, Oriented to Situation Alcohol / Substance use:  Never Used Psych involvement (Current and /or in the community):  No (Comment)  Discharge Needs  Concerns to be addressed:  Care Coordination Readmission within the last 30 days:  No Current discharge risk:  Lives alone Barriers to Discharge:  Continued Medical Work up   Becky Morales 05/29/2015, 7:35 PM

## 2015-05-30 LAB — COMPREHENSIVE METABOLIC PANEL
ALT: 14 U/L (ref 14–54)
AST: 22 U/L (ref 15–41)
Albumin: 2.6 g/dL — ABNORMAL LOW (ref 3.5–5.0)
Alkaline Phosphatase: 104 U/L (ref 38–126)
Anion gap: 9 (ref 5–15)
BILIRUBIN TOTAL: 1 mg/dL (ref 0.3–1.2)
BUN: 13 mg/dL (ref 6–20)
CHLORIDE: 92 mmol/L — AB (ref 101–111)
CO2: 25 mmol/L (ref 22–32)
Calcium: 8.8 mg/dL — ABNORMAL LOW (ref 8.9–10.3)
Creatinine, Ser: 0.96 mg/dL (ref 0.44–1.00)
GFR calc Af Amer: 57 mL/min — ABNORMAL LOW (ref >60)
GFR, EST NON AFRICAN AMERICAN: 49 mL/min — AB (ref >60)
GLUCOSE: 113 mg/dL — AB (ref 65–99)
POTASSIUM: 4.6 mmol/L (ref 3.5–5.1)
Sodium: 126 mmol/L — ABNORMAL LOW (ref 135–145)
Total Protein: 6.1 g/dL — ABNORMAL LOW (ref 6.5–8.1)

## 2015-05-30 LAB — CBC WITH DIFFERENTIAL/PLATELET
BASOS ABS: 0 10*3/uL (ref 0.0–0.1)
Basophils Relative: 0 % (ref 0–1)
EOS ABS: 0 10*3/uL (ref 0.0–0.7)
EOS PCT: 0 % (ref 0–5)
HCT: 49 % — ABNORMAL HIGH (ref 36.0–46.0)
HEMOGLOBIN: 17.3 g/dL — AB (ref 12.0–15.0)
Lymphocytes Relative: 8 % — ABNORMAL LOW (ref 12–46)
Lymphs Abs: 0.7 10*3/uL (ref 0.7–4.0)
MCH: 32 pg (ref 26.0–34.0)
MCHC: 35.3 g/dL (ref 30.0–36.0)
MCV: 90.7 fL (ref 78.0–100.0)
MONOS PCT: 14 % — AB (ref 3–12)
Monocytes Absolute: 1.2 10*3/uL — ABNORMAL HIGH (ref 0.1–1.0)
NEUTROS ABS: 6.9 10*3/uL (ref 1.7–7.7)
Neutrophils Relative %: 78 % — ABNORMAL HIGH (ref 43–77)
PLATELETS: 375 10*3/uL (ref 150–400)
RBC: 5.4 MIL/uL — ABNORMAL HIGH (ref 3.87–5.11)
RDW: 17.3 % — AB (ref 11.5–15.5)
WBC: 8.9 10*3/uL (ref 4.0–10.5)

## 2015-05-30 LAB — TSH: TSH: 3.697 u[IU]/mL (ref 0.350–4.500)

## 2015-05-30 LAB — MAGNESIUM: MAGNESIUM: 1.7 mg/dL (ref 1.7–2.4)

## 2015-05-30 NOTE — Progress Notes (Signed)
Speech Language Pathology Treatment: Dysphagia  Patient Details Name: Becky Morales MRN: [id number] DOB: April 05, 1920 Today's Date: 05/30/2015 Time: 1205-1221 SLP Time Calculation (min) (ACUTE ONLY): 16 min  Assessment / Plan / Recommendation Clinical Impression  Dysphagia treatment provided for diet tolerance check/ compensatory strategy training. Observed pt with regular diet/ thin liquid consistencies. Pt had 1 immediate cough following large straw sip of thin liquid. No other s/s of aspiration noted during treatment; began providing cues for pt to take small sips at a time. Provided education on strategies and reflux precautions. No acute findings on CXR 8/8. Aspiration risk appears mild with supervision. Recommend continuing regular diet/ thin liquids by cup, if by straw pt needs cues to take 1 small sip at a time. SLP will sign off at this time; please re-consult if needs arise.   HPI Other Pertinent Information: Becky Morales is a 81 y.o. female with PMH of hypertension, hyperlipidemia, GERD, CAD (S/B stent placement to 2012), carcinoid tumor (not know the detail), diastolic congestive heart failure, who presents with increased urinary frequency and altered mental status.  Patient is accompanied by her niece. Per her niece, she was found in her bed and was unable to get woken up by her niece. Patient seems to be more confused than her baseline. Patient reports increased urinary frequency recently, but no dysuria or burning on urination. No nausea, vomiting, abdominal pain, diarrhea. She has pain to her right low back for the past week. No unilateral weakness, numbness, tingling sensations, urinary incontinence or loss of control for bowel movement. She denies any falls or injuries. Patient does not have fever, chills, chest pain, cough, shortness of breath, leg edema, rashes. When I saw patient in ED, she looks tired, her mental status already improved per her niece.  In ED, patient was found to have  positive urinalysis for UTI, lipase 75, temperature normal, no tachycardia, hyponatremia sodium 128, creatinine 1.07. Chest x-ray negative for acute issues. CT head is negative for acute abnormalities. X-ray of lumbar spine is negative for acute abnormalities, but showed mild left convex thoracolumbar scoliosis and mild underlying degenerative change. CT per renal stone protocol is negative for kidney stone, but showed gallstone without cholecystitis. Patient is admitted to inpatient for further evaluation and treatment.  Most recent chest x-ray is negative for acute findings.     Pertinent Vitals Pain Assessment: No/denies pain  SLP Plan  All goals met    Recommendations Diet recommendations: Regular;Thin liquid Liquids provided via: Cup;Straw Medication Administration: Whole meds with liquid Supervision: Patient able to self feed;Intermittent supervision to cue for compensatory strategies Compensations: Small sips/bites;Follow solids with liquid;Slow rate Postural Changes and/or Swallow Maneuvers: Seated upright 90 degrees;Upright 30-60 min after meal              Oral Care Recommendations: Oral care BID Follow up Recommendations: None Plan: All goals met    GO     Kern Reap, MA, CCC-SLP 05/30/2015, 12:57 PM [phone]

## 2015-05-30 NOTE — Progress Notes (Signed)
Physical Therapy Treatment Patient Details Name: Becky Morales MRN: [id number] DOB: January 20, 1920 Today's Date: 05/30/2015    History of Present Illness Pt is is a 81 y.o. female with PMH of hypertension, hyperlipidemia, GERD, CAD (S/B stent placement to 2012), carcinoid tumor (not know the detail), diastolic congestive heart failure, who presents with increased urinary frequency and altered mental status.    PT Comments    Pt with limited gait due to feeling constipated and feeling like she needed to have a BM.  Pt will not have 24 hour A and is weak and requiring MIN A with mobility.  Continue to recommend SNF for rehab, but pt is refusing at this time.  If pt continues to refuse will need HHPT and as much A as she can get from aides to supplement time when family cannot be present, but do continue to feel SNF is safest d/c plan.  Follow Up Recommendations  SNF;Supervision/Assistance - 24 hour     Equipment Recommendations  Rolling walker with 5" wheels    Recommendations for Other Services       Precautions / Restrictions Precautions Precautions: Fall Restrictions Weight Bearing Restrictions: No    Mobility  Bed Mobility               General bed mobility comments: Pt up on BSC with NT present.  Transfers Overall transfer level: Needs assistance Equipment used: Rolling walker (2 wheeled) Transfers: Sit to/from Stand Sit to Stand: Min assist         General transfer comment: Multiple reps from [facility] with cues for hand placement.  Ambulation/Gait Ambulation/Gait assistance: Min assist Ambulation Distance (Feet): 20 Feet Assistive device: Rolling walker (2 wheeled) Gait Pattern/deviations: Decreased step length - right;Decreased step length - left;Trunk flexed Gait velocity: decreased   General Gait Details: Pt reluctantly agreeable to gait in room due to feeling constipated and feels she will need to have BM soon. Pt had difficulty staying within RW and keeps it too  far forward and to the side.   Stairs            Wheelchair Mobility    Modified Rankin (Stroke Patients Only)       Balance             Standing balance-Leahy Scale: Poor Standing balance comment: requires RW                    Cognition Arousal/Alertness: Awake/alert Behavior During Therapy: WFL for tasks assessed/performed Overall Cognitive Status: No family/caregiver present to determine baseline cognitive functioning Area of Impairment: Safety/judgement         Safety/Judgement: Decreased awareness of deficits     General Comments: Poor insight into deficits and her true ability.    Exercises      General Comments General comments (skin integrity, edema, etc.): Deferred ther ex due to needing to use BSC.      Pertinent Vitals/Pain Pain Assessment: No/denies pain    Home Living                      Prior Function            PT Goals (current goals can now be found in the care plan section) Acute Rehab PT Goals Patient Stated Goal: now refusing SNF, thinks she can manage at home PT Goal Formulation: With patient Time For Goal Achievement: 06/02/15 Potential to Achieve Goals: Good Progress towards PT goals: Progressing toward goals  Frequency  Min 3X/week    PT Plan Current plan remains appropriate    Co-evaluation             End of Session Equipment Utilized During Treatment: Gait belt Activity Tolerance: Other (comment) (limited due to feeling like she needed to have BM) Patient left: Other (comment);with call bell/phone within reach (on [facility]. NT aware )     Time: 1023-1040 PT Time Calculation (min) (ACUTE ONLY): 17 min  Charges:  $Gait Training: 8-22 mins                    G Codes:      Juanangel Soderholm LUBECK 05/30/2015, 10:52 AM

## 2015-05-30 NOTE — Care Management Note (Signed)
Case Management Note  Patient Details  Name: Becky Morales MRN: [id number] Date of Birth: Jun 05, 1920  Subjective/Objective:      Admitted with UTI              Action/Plan: Talked to patient about DCP; patient is refusing to go to SNF and plans to return home at discharge. HHC choice offered, patient chose [facility]. Marie with [facility] called for arrangements; No DME needed, patient has a rolling walker and a cane at home. Patient states that [place] her POA, does all of the shopping and cooking for her. ( patient stated " she cant cook a bit.") Patient stated that she has lots of family members that check on her along with her church family.  Expected Discharge Date:    05/31/2015              Expected Discharge Plan:  [place]  In-House Referral:  Clinical Social Work  Discharge planning Services  CM Consult  HH Arranged:  RN, PT, OT, Nurse's Aide [place] Agency:  [place]  Status of Service:  In process, will continue to follow  Medicare Important Message Given:  [facility] notification given  Becky Morales [phone] 05/30/2015, 3:18 PM

## 2015-05-30 NOTE — Progress Notes (Signed)
Pt wanting to use diaper since last night and was advised that it is not allowed, pt became upset, pt is incontinent and was advised that diaper will irritate her skin when wet and she's prone to develop skin breakdown, even after explanation pt still upset and wants to go home, advised that she wait for the doctor to do rounds in the morning and maybe she can be discharge. Pt agreed and went to sleep.

## 2015-05-30 NOTE — Progress Notes (Signed)
[facility] PROGRESS NOTE  Becky Morales FIE:[id number] DOB: 03/09/1920 DOA: 05/25/2015 PCP: Garret Reddish, MD HPI/Subjective: 81 y.o. BF PMHx of hypertension, hyperlipidemia, GERD, CAD (S/P stent placement to 2012), carcinoid tumor (not know the detail), HTN,Diastolic congestive heart failure, CAD native artery, murmur 11/12 LVEF =55-60%, hyponatremia.  Presents with increased urinary frequency and altered mental status. Patient is accompanied by her niece. Per her niece, she was found in her bed and was unable to get woken up by her niece. Patient seems to be more confused than her baseline. Patient reports increased urinary frequency recently, but no dysuria or burning on urination. No nausea, vomiting, abdominal pain, diarrhea. She has pain to her right low back for the past week. No unilateral weakness, numbness, tingling sensations, urinary incontinence or loss of control for bowel movement. She denies any falls or injuries. Patient does not have fever, chills, chest pain, cough, shortness of breath, leg edema, rashes. When I saw patient in ED, she looks tired, her mental status already improved per her niece.   In ED, patient was found to have positive urinalysis for UTI, lipase 75, temperature normal, no tachycardia, hyponatremia sodium 128, creatinine 1.07. Chest x-ray negative for acute issues. CT head is negative for acute abnormalities. X-ray of lumbar spine is negative for acute abnormalities, but showed mild left convex thoracolumbar scoliosis and mild underlying degenerative change. CT per renal stone protocol is negative for kidney stone, but showed gallstone without cholecystitis. Patient is admitted to inpatient for further evaluation and treatment.    Assessment/Plan: UTI (lower urinary tract infection) - Although patient does not meet guidelines for sepsis, with a lactic acidosis in a 81 year old will treat sepsis, patient may not express fever, leukocytosis. -  positive  AEROCOCCUS URINAE -Echocardiogram; no significant decline when compared to echocardiogram from 2014 see results below - Ceftriaxone IV will be completed on 8/9 -PT/OT recommends SNF; contact [name] in a.m. to be in process if patient agrees  Lactic acidosis -Resolved with hydration and antibiotics  Acute encephalopathy:  -Resolved  -Hyponatremia:  - This seems to be chronic issue. Na was 130on 04/06/15, slightly worsening.  - suspect SIADH -Review of EMR does not show medication which should cause hyponatremia -fluid restrict  Essential hypertension:  -Hydralazine 10 mg TID - IV hydralazine PRN  - Irbesartan 150 mg daily -Metoprolol 25 mg BID, -BP well controlled  GERD: - Protonix  Back pain / hip:  - X-ray of lumbar has no acute bony abnormalities. Patient is known to have spinal stenosis of lumbar region at multiple levels, no alarming symptoms, such as leg weakness or urinary incontinence. - When necessary Percocet and morphine - Hip film; degenerative changes, osteopenia  CAD (coronary artery disease)in native artery: - No chest pain.  - Aspirin, metoprolol, when necessary nitroglycerin SL  HLD:  -Last LDL was 64 on 10/05/14. - Continue Lipitor 80 mg daily  Chronic diastolic heart failure:  - 2-D echo on 02/05/13 showed EF of 60-65% with grade 1 diastolic dysfunction. Patient is not on diuretics at home. CHF is compensated. No leg edema. - Continue aspirin and metoprolol -echocardiogram: Compared to the prior study, there has been no significantinterval change.     Code Status:  DO NOT RESUSCITATE Family Communication: none (indicate person spoken with, relationship, and if by phone, the number) Disposition Plan: home vs SNF   Consultants:    Procedures: 8/8 echocardiogram;- Left ventricle: moderate concentric hypertrophy.-LVEF= 55% to 60%. - (grade 1 diastolic dysfunction).- Aortic valve: mild to  moderate regurgitation.  8/8 PCXR;Pulmonary vascular  prominence most notable centrally without change.    Cultures 8/4 urine positive AEROCOCCUS URINAE 8/4 blood right arm/left hand NGTD   Antibiotics: Ceftriaxone 8/4>> stopped 8/9     DVT prophylaxis Subcutaneous heparin    Objective: Filed Vitals:   05/29/15 1622 05/29/15 2143 05/30/15 0721 05/30/15 0933  BP:  144/68 134/70 175/66  Pulse:  90 84 79  Temp:   97.9 F (36.6 C)   TempSrc:   Oral   Resp:  18 20 18   Height:      Weight:   61.644 kg (135 lb 14.4 oz)   SpO2: 94% 95% 94% 97%    Intake/Output Summary (Last 24 hours) at 05/30/15 1154 Last data filed at 05/30/15 0933  Gross per 24 hour  Intake   1770 ml  Output    200 ml  Net   1570 ml   Filed Weights   05/28/15 0551 05/29/15 0510 05/30/15 0721  Weight: 55.656 kg (122 lb 11.2 oz) 61.417 kg (135 lb 6.4 oz) 61.644 kg (135 lb 14.4 oz)     Exam: General: A/O 4, NAD, No acute respiratory distress Cardiovascular: Regular rate and rhythm, positive systolic murmur grade 2/6, negative gallop or rub normal S1 and S2 Abdomen:negative abdominal pain, negative dysphagia, nondistended, positive soft, bowel sounds, no rebound, no ascites, no appreciable mass, negative CVA tenderness Neurologic:  Cranial nerves II through XII intact, tongue/uvula midline, all extremities muscle strength 5/5, sensation intact throughout, negative dysarthria, negative expressive aphasia, negative receptive aphasia.    Data Reviewed: Basic Metabolic Panel:  Recent Labs Lab 05/26/15 0452 05/27/15 0327 05/28/15 0543 05/29/15 0419 05/30/15 0431  NA 128* 126* 132* 130* 126*  K 4.5 4.3 3.5 5.6* 4.6  CL 93* 92* 104 97* 92*  CO2 28 21* 23 26 25   GLUCOSE 112* 101* 83 108* 113*  BUN 15 12 16 19 13   CREATININE 1.08* 1.01* 0.94 1.10* 0.96  CALCIUM 8.9 9.0 7.5* 9.0 8.8*  MG  --   --  1.4* 1.7 1.7   Liver Function Tests:  Recent Labs Lab 05/25/15 1743 05/28/15 0543 05/29/15 0419 05/30/15 0431  AST 24 23 28 22   ALT 18 14 18 14    ALKPHOS 110 84 106 104  BILITOT 0.8 1.2 0.9 1.0  PROT 7.0 5.2* 7.1 6.1*  ALBUMIN 3.2* 2.3* 2.8* 2.6*    Recent Labs Lab 05/25/15 1743  LIPASE 35   No results for input(s): AMMONIA in the last 168 hours. CBC:  Recent Labs Lab 05/25/15 1743 05/26/15 0452 05/27/15 0327 05/28/15 0543 05/29/15 0419 05/30/15 0431  WBC 4.6 5.2 5.9 5.2 6.8 8.9  NEUTROABS 3.4  --   --  3.6 4.7 6.9  HGB 17.6* 16.6* 17.5* 16.2* 18.0* 17.3*  HCT 48.8* 46.9* 48.6* 45.3 50.8* 49.0*  MCV 88.1 88.3 88.5 88.3 90.9 90.7  PLT 375 416* 406* 357 390 375   Cardiac Enzymes: No results for input(s): CKTOTAL, CKMB, CKMBINDEX, TROPONINI in the last 168 hours. BNP (last 3 results)  Recent Labs  04/07/15 1130 05/25/15 2221  BNP 306.0* 154.7*    ProBNP (last 3 results)  Recent Labs  08/03/14 1643  PROBNP 181.0*    CBG:  Recent Labs Lab 05/26/15 0811 05/27/15 0606 05/28/15 0542 05/29/15 0636  GLUCAP 81 95 87 107*    Recent Results (from the past 240 hour(s))  Urine culture     Status: None   Collection Time: 05/25/15  7:06 PM  Result  Value Ref Range Status   Specimen Description URINE, CLEAN CATCH  Final   Special Requests NONE  Final   Culture >=100,000 COLONIES/mL AEROCOCCUS URINAE  Final   Report Status 05/27/2015 FINAL  Final  Culture, blood (routine x 2)     Status: None (Preliminary result)   Collection Time: 05/25/15 10:27 PM  Result Value Ref Range Status   Specimen Description BLOOD RIGHT ARM  Final   Special Requests   Final    BOTTLES DRAWN AEROBIC AND ANAEROBIC [place] BLUE 3CC RED   Culture NO GROWTH 3 DAYS  Final   Report Status PENDING  Incomplete  Culture, blood (routine x 2)     Status: None (Preliminary result)   Collection Time: 05/26/15  2:02 AM  Result Value Ref Range Status   Specimen Description BLOOD LEFT HAND  Final   Special Requests BOTTLES DRAWN AEROBIC ONLY 1CC  Final   Culture NO GROWTH 3 DAYS  Final   Report Status PENDING  Incomplete     Studies: Dg  Chest Port 1 View  05/29/2015   CLINICAL DATA:  81 year old female with increased shortness of breath today. Subsequent encounter.  EXAM: PORTABLE CHEST - 1 VIEW  COMPARISON:  05/25/2015.  FINDINGS: Pulmonary vascular prominence most notable centrally without significant change.  Persistently elevated right hemidiaphragm.  No segmental consolidation or pneumothorax.  Nodularity right lung may represent overlying structures as known nodules seen in this region on the recent exam.  Mild cardiomegaly.  Calcified tortuous aorta.  IMPRESSION: Pulmonary vascular prominence most notable centrally without change.  No segmental consolidation.  Cardiomegaly.  Calcified tortuous aorta.   Electronically Signed   By: Genia Del M.D.   On: 05/29/2015 16:48    Scheduled Meds: . aspirin EC  81 mg Oral Daily  . atorvastatin  80 mg Oral Q Mon-1800  . calcium-vitamin D  1 tablet Oral BID  . cefTRIAXone (ROCEPHIN)  IV  1 g Intravenous Q24H  . feeding supplement (ENSURE ENLIVE)  237 mL Oral BID BM  . ferrous sulfate  325 mg Oral Q breakfast  . heparin  5,000 Units Subcutaneous 3 times per day  . hydrALAZINE  10 mg Oral 3 times per day  . irbesartan  150 mg Oral Daily  . labetalol  5 mg Intravenous Once  . metoprolol tartrate  25 mg Oral BID  . multivitamin with minerals  1 tablet Oral Daily  . pantoprazole  40 mg Oral Daily  . sodium chloride  3 mL Intravenous Q12H   Continuous Infusions:    Principal Problem:   UTI (lower urinary tract infection) Active Problems:   Malignant carcinoid tumor of unknown primary site   Essential hypertension   Allergic rhinitis   GERD   Spinal stenosis of lumbar region at multiple levels   CAD (coronary artery disease)  s/p  PCI for NSTEMI. with angina.    Hyperlipidemia   DNR/DNI 07/21/14   Anxiety state   Hyponatremia   Chronic diastolic heart failure   Acute encephalopathy   Urinary tract infection   Back pain   Sepsis secondary to UTI   Lactic acidosis    Notalgia   CAD in native artery   HLD (hyperlipidemia)   Chronic diastolic CHF (congestive heart failure)   Aspiration into airway    Time spent:25 minutes    Eliseo Squires, Swati Granberry  [facility] Pager [phone]. If 7PM-7AM, please contact night-coverage at [email or web address], password [facility] 05/30/2015, 11:54 AM  LOS: 5 days

## 2015-05-31 LAB — CULTURE, BLOOD (ROUTINE X 2)
CULTURE: NO GROWTH
CULTURE: NO GROWTH

## 2015-05-31 LAB — CBC WITH DIFFERENTIAL/PLATELET
BASOS ABS: 0 10*3/uL (ref 0.0–0.1)
Basophils Relative: 0 % (ref 0–1)
EOS PCT: 2 % (ref 0–5)
Eosinophils Absolute: 0.2 10*3/uL (ref 0.0–0.7)
HCT: 46.7 % — ABNORMAL HIGH (ref 36.0–46.0)
HEMOGLOBIN: 16.4 g/dL — AB (ref 12.0–15.0)
Lymphocytes Relative: 9 % — ABNORMAL LOW (ref 12–46)
Lymphs Abs: 1 10*3/uL (ref 0.7–4.0)
MCH: 31.6 pg (ref 26.0–34.0)
MCHC: 35.1 g/dL (ref 30.0–36.0)
MCV: 90 fL (ref 78.0–100.0)
Monocytes Absolute: 1.8 10*3/uL — ABNORMAL HIGH (ref 0.1–1.0)
Monocytes Relative: 17 % — ABNORMAL HIGH (ref 3–12)
NEUTROS ABS: 7.4 10*3/uL (ref 1.7–7.7)
Neutrophils Relative %: 72 % (ref 43–77)
Platelets: 403 10*3/uL — ABNORMAL HIGH (ref 150–400)
RBC: 5.19 MIL/uL — ABNORMAL HIGH (ref 3.87–5.11)
RDW: 17.2 % — ABNORMAL HIGH (ref 11.5–15.5)
WBC: 10.3 10*3/uL (ref 4.0–10.5)

## 2015-05-31 LAB — BASIC METABOLIC PANEL
Anion gap: 10 (ref 5–15)
Anion gap: 8 (ref 5–15)
BUN: 19 mg/dL (ref 6–20)
BUN: 18 mg/dL (ref 6–20)
CHLORIDE: 90 mmol/L — AB (ref 101–111)
CO2: 28 mmol/L (ref 22–32)
CO2: 24 mmol/L (ref 22–32)
CREATININE: 1.04 mg/dL — AB (ref 0.44–1.00)
Calcium: 8.8 mg/dL — ABNORMAL LOW (ref 8.9–10.3)
Calcium: 9 mg/dL (ref 8.9–10.3)
Chloride: 90 mmol/L — ABNORMAL LOW (ref 101–111)
Creatinine, Ser: 1.19 mg/dL — ABNORMAL HIGH (ref 0.44–1.00)
GFR calc Af Amer: 52 mL/min — ABNORMAL LOW (ref >60)
GFR calc Af Amer: 44 mL/min — ABNORMAL LOW (ref >60)
GFR, EST NON AFRICAN AMERICAN: 38 mL/min — AB (ref >60)
GFR, EST NON AFRICAN AMERICAN: 45 mL/min — AB (ref >60)
Glucose, Bld: 125 mg/dL — ABNORMAL HIGH (ref 65–99)
Glucose, Bld: 119 mg/dL — ABNORMAL HIGH (ref 65–99)
Potassium: 4.8 mmol/L (ref 3.5–5.1)
Potassium: 5.2 mmol/L — ABNORMAL HIGH (ref 3.5–5.1)
Sodium: 124 mmol/L — ABNORMAL LOW (ref 135–145)
Sodium: 126 mmol/L — ABNORMAL LOW (ref 135–145)

## 2015-05-31 LAB — COMPREHENSIVE METABOLIC PANEL
ALT: 15 U/L (ref 14–54)
AST: 21 U/L (ref 15–41)
Albumin: 2.4 g/dL — ABNORMAL LOW (ref 3.5–5.0)
Alkaline Phosphatase: 93 U/L (ref 38–126)
Anion gap: 9 (ref 5–15)
BUN: 19 mg/dL (ref 6–20)
CALCIUM: 8.6 mg/dL — AB (ref 8.9–10.3)
CO2: 25 mmol/L (ref 22–32)
CREATININE: 1.01 mg/dL — AB (ref 0.44–1.00)
Chloride: 91 mmol/L — ABNORMAL LOW (ref 101–111)
GFR calc non Af Amer: 46 mL/min — ABNORMAL LOW (ref >60)
GFR, EST AFRICAN AMERICAN: 54 mL/min — AB (ref >60)
Glucose, Bld: 101 mg/dL — ABNORMAL HIGH (ref 65–99)
Potassium: 4.6 mmol/L (ref 3.5–5.1)
Sodium: 125 mmol/L — ABNORMAL LOW (ref 135–145)
TOTAL PROTEIN: 6.4 g/dL — AB (ref 6.5–8.1)
Total Bilirubin: 0.8 mg/dL (ref 0.3–1.2)

## 2015-05-31 LAB — GLUCOSE, CAPILLARY: Glucose-Capillary: 113 mg/dL — ABNORMAL HIGH (ref 65–99)

## 2015-05-31 LAB — MAGNESIUM: Magnesium: 1.8 mg/dL (ref 1.7–2.4)

## 2015-05-31 MED ORDER — FERROUS SULFATE 325 (65 FE) MG PO TABS
325.0000 mg | ORAL_TABLET | Freq: Every day | ORAL | Status: DC
  Administered 2015-05-31 – 2015-06-03 (×4): 325 mg via ORAL
  Filled 2015-05-31 (×5): qty 1

## 2015-05-31 NOTE — Progress Notes (Signed)
[facility] PROGRESS NOTE  KAYLN GARCEAU OAC:[id number] DOB: Jan 11, 1920 DOA: 05/25/2015 PCP: Garret Reddish, MD HPI/Subjective: 81 y.o. BF PMHx of hypertension, hyperlipidemia, GERD, CAD (S/P stent placement to 2012), carcinoid tumor (not know the detail), HTN,Diastolic congestive heart failure, CAD native artery, murmur 11/12 LVEF =55-60%, hyponatremia.  Presents with increased urinary frequency and altered mental status. Patient is accompanied by her niece. Per her niece, she was found in her bed and was unable to get woken up by her niece. Patient seems to be more confused than her baseline. Patient reports increased urinary frequency recently, but no dysuria or burning on urination. No nausea, vomiting, abdominal pain, diarrhea. She has pain to her right low back for the past week. No unilateral weakness, numbness, tingling sensations, urinary incontinence or loss of control for bowel movement. She denies any falls or injuries. Patient does not have fever, chills, chest pain, cough, shortness of breath, leg edema, rashes.  In ED, patient was found to have positive urinalysis for UTI, lipase 75, temperature normal, no tachycardia, hyponatremia sodium 128, creatinine 1.07. Chest x-ray negative for acute issues. CT head is negative for acute abnormalities. X-ray of lumbar spine is negative for acute abnormalities, but showed mild left convex thoracolumbar scoliosis and mild underlying degenerative change. CT per renal stone protocol is negative for kidney stone, but showed gallstone without cholecystitis. Patient is admitted to inpatient for further evaluation and treatment.    Assessment/Plan: UTI (lower urinary tract infection) - Although patient does not meet guidelines for sepsis, with a lactic acidosis in a 81 year old will treat sepsis, patient may not express fever, leukocytosis. -  positive AEROCOCCUS URINAE--treated -Echocardiogram; no significant decline when compared to echocardiogram  from 2014 see results below - Ceftriaxone IV  completed on 8/9 -PT/OT recommends SNF; patient refusing SNF-- will max out home care  Lactic acidosis -Resolved with hydration and antibiotics  Acute encephalopathy:  -Resolved  -Hyponatremia:  - This seems to be chronic issue. Na was 130on 04/06/15, slightly worsening.  - suspect SIADH -Review of EMR does not show medication which should cause hyponatremia -fluid restrict and monitor  Essential hypertension:  -Hydralazine 10 mg TID - IV hydralazine PRN  - Irbesartan 150 mg daily -Metoprolol 25 mg BID, -BP well controlled  GERD: - Protonix  Back pain / hip:  - X-ray of lumbar has no acute bony abnormalities. Patient is known to have spinal stenosis of lumbar region at multiple levels, no alarming symptoms, such as leg weakness or urinary incontinence. - When necessary Percocet and morphine - Hip film; degenerative changes, osteopenia  CAD (coronary artery disease)in native artery: - No chest pain.  - Aspirin, metoprolol, when necessary nitroglycerin SL  HLD:  -Last LDL was 64 on 10/05/14. - Continue Lipitor 80 mg daily  Chronic diastolic heart failure:  - 2-D echo on 02/05/13 showed EF of 60-65% with grade 1 diastolic dysfunction. Patient is not on diuretics at home. CHF is compensated. No leg edema. - Continue aspirin and metoprolol -echocardiogram: Compared to the prior study, there has been no significantinterval change.     Code Status:  DO NOT RESUSCITATE Family Communication: none (indicate person spoken with, relationship, and if by phone, the number) Disposition Plan: home in AM   Consultants:    Procedures: 8/8 echocardiogram;- Left ventricle: moderate concentric hypertrophy.-LVEF= 55% to 60%. - (grade 1 diastolic dysfunction).- Aortic valve: mild to moderate regurgitation.  8/8 PCXR;Pulmonary vascular prominence most notable centrally without change.    Cultures 8/4 urine positive AEROCOCCUS  URINAE 8/4 blood right arm/left hand NGTD   Antibiotics: Ceftriaxone 8/4>> stopped 8/9     DVT prophylaxis Subcutaneous heparin    Objective: Filed Vitals:   05/30/15 1421 05/30/15 2120 05/31/15 0501 05/31/15 0824  BP: 167/75 147/58 166/70 136/65  Pulse: 87 90 85 91  Temp: 98.7 F (37.1 C) 98.6 F (37 C) 98.3 F (36.8 C) 98.6 F (37 C)  TempSrc: Oral Oral Oral Oral  Resp: 18 18 22 20   Height:      Weight:   62.37 kg (137 lb 8 oz)   SpO2: 98% 96% 96% 94%    Intake/Output Summary (Last 24 hours) at 05/31/15 1336 Last data filed at 05/31/15 0940  Gross per 24 hour  Intake   1150 ml  Output    401 ml  Net    749 ml   Filed Weights   05/29/15 0510 05/30/15 0721 05/31/15 0501  Weight: 61.417 kg (135 lb 6.4 oz) 61.644 kg (135 lb 14.4 oz) 62.37 kg (137 lb 8 oz)     Exam: General: A/O 4, NAD, No acute respiratory distress Cardiovascular: Regular rate and rhythm, positive systolic murmur grade 2/6, negative gallop or rub normal S1 and S2 Abdomen:negative abdominal pain, negative dysphagia, nondistended, positive soft, bowel sounds, no rebound, no ascites, no appreciable mass, negative CVA tenderness Neurologic:  Cranial nerves II through XII intact, tongue/uvula midline, all extremities muscle strength 5/5, sensation intact throughout, negative dysarthria, negative expressive aphasia, negative receptive aphasia.    Data Reviewed: Basic Metabolic Panel:  Recent Labs Lab 05/28/15 0543 05/29/15 0419 05/30/15 0431 05/31/15 0028 05/31/15 0342  NA 132* 130* 126* 124* 125*  K 3.5 5.6* 4.6 4.8 4.6  CL 104 97* 92* 90* 91*  CO2 23 26 25 24 25   GLUCOSE 83 108* 113* 125* 101*  BUN 16 19 13 19 19   CREATININE 0.94 1.10* 0.96 1.04* 1.01*  CALCIUM 7.5* 9.0 8.8* 8.8* 8.6*  MG 1.4* 1.7 1.7  --  1.8   Liver Function Tests:  Recent Labs Lab 05/25/15 1743 05/28/15 0543 05/29/15 0419 05/30/15 0431 05/31/15 0342  AST 24 23 28 22 21   ALT 18 14 18 14 15   ALKPHOS 110 84  106 104 93  BILITOT 0.8 1.2 0.9 1.0 0.8  PROT 7.0 5.2* 7.1 6.1* 6.4*  ALBUMIN 3.2* 2.3* 2.8* 2.6* 2.4*    Recent Labs Lab 05/25/15 1743  LIPASE 35   No results for input(s): AMMONIA in the last 168 hours. CBC:  Recent Labs Lab 05/25/15 1743  05/27/15 0327 05/28/15 0543 05/29/15 0419 05/30/15 0431 05/31/15 0342  WBC 4.6  < > 5.9 5.2 6.8 8.9 10.3  NEUTROABS 3.4  --   --  3.6 4.7 6.9 7.4  HGB 17.6*  < > 17.5* 16.2* 18.0* 17.3* 16.4*  HCT 48.8*  < > 48.6* 45.3 50.8* 49.0* 46.7*  MCV 88.1  < > 88.5 88.3 90.9 90.7 90.0  PLT 375  < > 406* 357 390 375 403*  < > = values in this interval not displayed. Cardiac Enzymes: No results for input(s): CKTOTAL, CKMB, CKMBINDEX, TROPONINI in the last 168 hours. BNP (last 3 results)  Recent Labs  04/07/15 1130 05/25/15 2221  BNP 306.0* 154.7*    ProBNP (last 3 results)  Recent Labs  08/03/14 1643  PROBNP 181.0*    CBG:  Recent Labs Lab 05/26/15 0811 05/27/15 0606 05/28/15 0542 05/29/15 0636 05/31/15 0549  GLUCAP 81 95 87 107* 113*    Recent Results (from  the past 240 hour(s))  Urine culture     Status: None   Collection Time: 05/25/15  7:06 PM  Result Value Ref Range Status   Specimen Description URINE, CLEAN CATCH  Final   Special Requests NONE  Final   Culture >=100,000 COLONIES/mL AEROCOCCUS URINAE  Final   Report Status 05/27/2015 FINAL  Final  Culture, blood (routine x 2)     Status: None (Preliminary result)   Collection Time: 05/25/15 10:27 PM  Result Value Ref Range Status   Specimen Description BLOOD RIGHT ARM  Final   Special Requests   Final    BOTTLES DRAWN AEROBIC AND ANAEROBIC [place] BLUE 3CC RED   Culture NO GROWTH 4 DAYS  Final   Report Status PENDING  Incomplete  Culture, blood (routine x 2)     Status: None (Preliminary result)   Collection Time: 05/26/15  2:02 AM  Result Value Ref Range Status   Specimen Description BLOOD LEFT HAND  Final   Special Requests BOTTLES DRAWN AEROBIC ONLY 1CC   Final   Culture NO GROWTH 4 DAYS  Final   Report Status PENDING  Incomplete     Studies: Dg Chest Port 1 View  05/29/2015   CLINICAL DATA:  81 year old female with increased shortness of breath today. Subsequent encounter.  EXAM: PORTABLE CHEST - 1 VIEW  COMPARISON:  05/25/2015.  FINDINGS: Pulmonary vascular prominence most notable centrally without significant change.  Persistently elevated right hemidiaphragm.  No segmental consolidation or pneumothorax.  Nodularity right lung may represent overlying structures as known nodules seen in this region on the recent exam.  Mild cardiomegaly.  Calcified tortuous aorta.  IMPRESSION: Pulmonary vascular prominence most notable centrally without change.  No segmental consolidation.  Cardiomegaly.  Calcified tortuous aorta.   Electronically Signed   By: Genia Del M.D.   On: 05/29/2015 16:48    Scheduled Meds: . aspirin EC  81 mg Oral Daily  . atorvastatin  80 mg Oral Q Mon-1800  . calcium-vitamin D  1 tablet Oral BID  . feeding supplement (ENSURE ENLIVE)  237 mL Oral BID BM  . ferrous sulfate  325 mg Oral Q breakfast  . heparin  5,000 Units Subcutaneous 3 times per day  . hydrALAZINE  10 mg Oral 3 times per day  . irbesartan  150 mg Oral Daily  . labetalol  5 mg Intravenous Once  . metoprolol tartrate  25 mg Oral BID  . multivitamin with minerals  1 tablet Oral Daily  . pantoprazole  40 mg Oral Daily  . sodium chloride  3 mL Intravenous Q12H   Continuous Infusions:    Principal Problem:   UTI (lower urinary tract infection) Active Problems:   Malignant carcinoid tumor of unknown primary site   Essential hypertension   Allergic rhinitis   GERD   Spinal stenosis of lumbar region at multiple levels   CAD (coronary artery disease)  s/p  PCI for NSTEMI. with angina.    Hyperlipidemia   DNR/DNI 07/21/14   Anxiety state   Hyponatremia   Chronic diastolic heart failure   Acute encephalopathy   Urinary tract infection   Back pain    Sepsis secondary to UTI   Lactic acidosis   Notalgia   CAD in native artery   HLD (hyperlipidemia)   Chronic diastolic CHF (congestive heart failure)   Aspiration into airway    Time spent:25 minutes    Eliseo Squires, Syan Cullimore  [facility] Pager [phone]. If 7PM-7AM, please contact night-coverage at  [email or web address], password [facility] 05/31/2015, 1:36 PM  LOS: 6 days

## 2015-06-01 DIAGNOSIS — G9340 Encephalopathy, unspecified: Secondary | ICD-10-CM

## 2015-06-01 DIAGNOSIS — F068 Other specified mental disorders due to known physiological condition: Secondary | ICD-10-CM

## 2015-06-01 DIAGNOSIS — N390 Urinary tract infection, site not specified: Principal | ICD-10-CM

## 2015-06-01 LAB — COMPREHENSIVE METABOLIC PANEL
ALBUMIN: 2.4 g/dL — AB (ref 3.5–5.0)
ALT: 18 U/L (ref 14–54)
AST: 23 U/L (ref 15–41)
Alkaline Phosphatase: 89 U/L (ref 38–126)
Anion gap: 11 (ref 5–15)
BILIRUBIN TOTAL: 0.9 mg/dL (ref 0.3–1.2)
BUN: 22 mg/dL — AB (ref 6–20)
CO2: 27 mmol/L (ref 22–32)
Calcium: 9 mg/dL (ref 8.9–10.3)
Chloride: 89 mmol/L — ABNORMAL LOW (ref 101–111)
Creatinine, Ser: 0.99 mg/dL (ref 0.44–1.00)
GFR calc Af Amer: 55 mL/min — ABNORMAL LOW (ref >60)
GFR calc non Af Amer: 48 mL/min — ABNORMAL LOW (ref >60)
Glucose, Bld: 92 mg/dL (ref 65–99)
Potassium: 4.8 mmol/L (ref 3.5–5.1)
SODIUM: 127 mmol/L — AB (ref 135–145)
Total Protein: 6.4 g/dL — ABNORMAL LOW (ref 6.5–8.1)

## 2015-06-01 LAB — CBC WITH DIFFERENTIAL/PLATELET
BASOS PCT: 0 % (ref 0–1)
Basophils Absolute: 0 10*3/uL (ref 0.0–0.1)
Eosinophils Absolute: 0.2 10*3/uL (ref 0.0–0.7)
Eosinophils Relative: 3 % (ref 0–5)
HCT: 46.7 % — ABNORMAL HIGH (ref 36.0–46.0)
Hemoglobin: 16.4 g/dL — ABNORMAL HIGH (ref 12.0–15.0)
Lymphocytes Relative: 10 % — ABNORMAL LOW (ref 12–46)
Lymphs Abs: 0.7 10*3/uL (ref 0.7–4.0)
MCH: 32 pg (ref 26.0–34.0)
MCHC: 35.1 g/dL (ref 30.0–36.0)
MCV: 91 fL (ref 78.0–100.0)
Monocytes Absolute: 1.4 10*3/uL — ABNORMAL HIGH (ref 0.1–1.0)
Monocytes Relative: 18 % — ABNORMAL HIGH (ref 3–12)
NEUTROS ABS: 5.3 10*3/uL (ref 1.7–7.7)
NEUTROS PCT: 69 % (ref 43–77)
PLATELETS: 397 10*3/uL (ref 150–400)
RBC: 5.13 MIL/uL — ABNORMAL HIGH (ref 3.87–5.11)
RDW: 17.3 % — ABNORMAL HIGH (ref 11.5–15.5)
WBC: 7.6 10*3/uL (ref 4.0–10.5)

## 2015-06-01 LAB — MAGNESIUM: Magnesium: 1.8 mg/dL (ref 1.7–2.4)

## 2015-06-01 MED ORDER — DEMECLOCYCLINE HCL 150 MG PO TABS
300.0000 mg | ORAL_TABLET | Freq: Four times a day (QID) | ORAL | Status: DC
  Administered 2015-06-01 – 2015-06-03 (×8): 300 mg via ORAL
  Filled 2015-06-01 (×12): qty 2

## 2015-06-01 MED ORDER — TRAMADOL HCL 50 MG PO TABS
50.0000 mg | ORAL_TABLET | Freq: Once | ORAL | Status: DC
  Filled 2015-06-01: qty 1

## 2015-06-01 MED ORDER — KETOROLAC TROMETHAMINE 15 MG/ML IJ SOLN
15.0000 mg | Freq: Once | INTRAMUSCULAR | Status: AC
  Administered 2015-06-02: 15 mg via INTRAVENOUS
  Filled 2015-06-01: qty 1

## 2015-06-01 NOTE — Consult Note (Signed)
[facility] Consult   Reason for Consult:  Capacity evaluation Referring Physician:  Dr. Conley Canal Patient Identification: Becky Morales MRN:  [id number] Principal Diagnosis: UTI (lower urinary tract infection) Diagnosis:   Patient Active Problem List   Diagnosis Date Noted  . Aspiration into airway [T17.998A]   . Sepsis secondary to UTI [A41.9, N39.0]   . Lactic acidosis [E87.2]   . Notalgia [M54.9]   . CAD in native artery [I25.10]   . HLD (hyperlipidemia) [E78.5]   . Chronic diastolic CHF (congestive heart failure) [I50.32]   . UTI (lower urinary tract infection) [N39.0] 05/25/2015  . Acute encephalopathy [G93.40] 05/25/2015  . Urinary tract infection [N39.0] 05/25/2015  . Back pain [M54.9] 05/25/2015  . Urinary tract infectious disease [N39.0]   . Chronic diastolic heart failure [E36.62] 04/07/2015  . Stable angina [I20.8] 04/06/2015  . Hyponatremia [E87.1] 04/06/2015  . Goals of care, counseling/discussion [Z71.89] 02/24/2015  . Anxiety state [F41.1] 08/24/2014  . Palpitations [R00.2] 08/04/2014  . DNR/DNI 07/21/14 [Z51.5] 07/21/2014  . Dermatophytosis of nail [B35.1] 07/12/2013  . Hyperlipidemia [E78.5] 10/31/2011  . CAD (coronary artery disease)  s/p  PCI for NSTEMI. with angina.  [I25.811] 09/19/2011  . Spinal stenosis of lumbar region at multiple levels [M48.06] 08/29/2011  . CHOLELITHIASIS [K80.20] 05/02/2010  . Malignant carcinoid tumor of unknown primary site [C7A.00] 04/13/2007  . Essential hypertension [I10] 04/13/2007  . Allergic rhinitis [J30.9] 04/13/2007  . GERD [K21.9] 04/13/2007  . HYPERGLYCEMIA [R79.89] 04/13/2007    Total Time spent with patient: 1 hour  Subjective:   Becky DEETYA Morales is a 81 y.o. female patient admitted with AMS.  HPI:  [name] is a 81 years old female seen for psychiatric consultation and evaluation of capacity to understand her medical needs and living arrangements. Patient reported all her immediate family members   deceased and she has been living by herself and has limited psychosocial support system. Patient has a niece who has been helping from time to time. Patient has been suffering with increased urinary frequency and altered mental status patient is having difficulty to participate due to increased confusion and unable to wake her up by her niece. Patient has fine orientation but not concentration. Patient has fine language functions but difficult to understand comprehensively her medical problems and needed treatment. Patient has stated that she needed a partial denture was taken 2-3 weeks for her niece to help because her head niece works outside the home. Based on my evaluation patient does not have capacity to make her own medical decisions and will be beneficial for out-of-home placement.   Past Medical History:  Past Medical History  Diagnosis Date  . CARCINOID TUMOR 04/13/2007  . HYPERTENSION 04/13/2007  . ALLERGIC RHINITIS 04/13/2007  . GERD 04/13/2007  . HYPERGLYCEMIA 04/13/2007  . CHOLELITHIASIS 05/02/2010  . Arthritis   . Cancer   . Environmental allergies     has mucus in throat  . Diphtheria     age 24  . CAD (coronary artery disease)     NSTEMI.  LHC 09/03/11: pLAD 40-50%, pDx 80-90% (very small), lateral branch of OM 99%, m-dCFX 40%, oRCA 80%, m-dRCA 50-70%, EF 55%.  She had PCI with BMS to lateral branch of the OM.  RCA was tx medically.   . Hyponatremia   . Murmur     echo 11/12: EF 55-60%, mod calcified AV annulus, mild AI, mild MR  . Dermatophytosis of nail   . UTI (urinary tract infection) 03/2015  Past Surgical History  Procedure Laterality Date  . Tonsillectomy      many years ago  . Cardiac surgery    . Colon surgery      CANCER  . Small bowel repair    . Left heart catheterization with coronary angiogram N/A 09/03/2011    Procedure: LEFT HEART CATHETERIZATION WITH CORONARY ANGIOGRAM;  Surgeon: Peter M Martinique, MD;  Location: [facility] CATH LAB;  Service: Cardiovascular;   Laterality: N/A;  . Percutaneous coronary stent intervention (pci-s)  09/03/2011    Procedure: PERCUTANEOUS CORONARY STENT INTERVENTION (PCI-S);  Surgeon: Peter M Martinique, MD;  Location: [facility] CATH LAB;  Service: Cardiovascular;;   Family History:  Family History  Problem Relation Age of Onset  . Hypertension Mother   . Uterine cancer Mother   . Ovarian cancer Mother   . Hypertension Father   . Stroke Father   . Hypertension Brother   . Hyperlipidemia Brother   . Heart attack Brother   . Lung cancer Sister    Social History:  History  Alcohol Use No     History  Drug Use No    Social History   Social History  . Marital Status: Widowed    Spouse Name: N/A  . Number of Children: N/A  . Years of Education: N/A   Social History Main Topics  . Smoking status: Never Smoker   . Smokeless tobacco: Never Used  . Alcohol Use: No  . Drug Use: No  . Sexual Activity: No   Other Topics Concern  . None   Social History Narrative   Widowed  Over 25 years ago. Lives by herself, but has many family members that live in the area. Friends to support as well.       Does not drive much since flu shot in 2014 when she said she got to feeling poorly   At home-cooks, clean, cloth, shops for self, handles finances      Hobbies: previously liked to travel, talk to friends, reading      Wants 1st cousin to help make decisions but does not want HCPOA Renea Ee and Kenyon Ana      DNR/DNI   Additional Social History:                          Allergies:   Allergies  Allergen Reactions  . Ciprofloxacin     emesis  . Paxil [Paroxetine Hcl] Nausea And Vomiting    Labs:  Results for orders placed or performed during the hospital encounter of 05/25/15 (from the past 48 hour(s))  Basic metabolic panel     Status: Abnormal   Collection Time: 05/31/15 12:28 AM  Result Value Ref Range   Sodium 124 (L) 135 - 145 mmol/L   Potassium 4.8 3.5 - 5.1 mmol/L   Chloride 90 (L) 101  - 111 mmol/L   CO2 24 22 - 32 mmol/L   Glucose, Bld 125 (H) 65 - 99 mg/dL   BUN 19 6 - 20 mg/dL   Creatinine, Ser 1.04 (H) 0.44 - 1.00 mg/dL   Calcium 8.8 (L) 8.9 - 10.3 mg/dL   GFR calc non Af Amer 45 (L) >60 mL/min   GFR calc Af Amer 52 (L) >60 mL/min    Comment: (NOTE) The eGFR has been calculated using the CKD EPI equation. This calculation has not been validated in all clinical situations. eGFR's persistently <60 mL/min signify possible Chronic Kidney Disease.  Anion gap 10 5 - 15  CBC with Differential/Platelet     Status: Abnormal   Collection Time: 05/31/15  3:42 AM  Result Value Ref Range   WBC 10.3 4.0 - 10.5 K/uL   RBC 5.19 (H) 3.87 - 5.11 MIL/uL   Hemoglobin 16.4 (H) 12.0 - 15.0 g/dL   HCT 46.7 (H) 36.0 - 46.0 %   MCV 90.0 78.0 - 100.0 fL   MCH 31.6 26.0 - 34.0 pg   MCHC 35.1 30.0 - 36.0 g/dL   RDW 17.2 (H) 11.5 - 15.5 %   Platelets 403 (H) 150 - 400 K/uL   Neutrophils Relative % 72 43 - 77 %   Neutro Abs 7.4 1.7 - 7.7 K/uL   Lymphocytes Relative 9 (L) 12 - 46 %   Lymphs Abs 1.0 0.7 - 4.0 K/uL   Monocytes Relative 17 (H) 3 - 12 %   Monocytes Absolute 1.8 (H) 0.1 - 1.0 K/uL   Eosinophils Relative 2 0 - 5 %   Eosinophils Absolute 0.2 0.0 - 0.7 K/uL   Basophils Relative 0 0 - 1 %   Basophils Absolute 0.0 0.0 - 0.1 K/uL  Comprehensive metabolic panel     Status: Abnormal   Collection Time: 05/31/15  3:42 AM  Result Value Ref Range   Sodium 125 (L) 135 - 145 mmol/L   Potassium 4.6 3.5 - 5.1 mmol/L   Chloride 91 (L) 101 - 111 mmol/L   CO2 25 22 - 32 mmol/L   Glucose, Bld 101 (H) 65 - 99 mg/dL   BUN 19 6 - 20 mg/dL   Creatinine, Ser 1.01 (H) 0.44 - 1.00 mg/dL   Calcium 8.6 (L) 8.9 - 10.3 mg/dL   Total Protein 6.4 (L) 6.5 - 8.1 g/dL   Albumin 2.4 (L) 3.5 - 5.0 g/dL   AST 21 15 - 41 U/L   ALT 15 14 - 54 U/L   Alkaline Phosphatase 93 38 - 126 U/L   Total Bilirubin 0.8 0.3 - 1.2 mg/dL   GFR calc non Af Amer 46 (L) >60 mL/min   GFR calc Af Amer 54 (L) >60  mL/min    Comment: (NOTE) The eGFR has been calculated using the CKD EPI equation. This calculation has not been validated in all clinical situations. eGFR's persistently <60 mL/min signify possible Chronic Kidney Disease.    Anion gap 9 5 - 15  Magnesium     Status: None   Collection Time: 05/31/15  3:42 AM  Result Value Ref Range   Magnesium 1.8 1.7 - 2.4 mg/dL  Glucose, capillary     Status: Abnormal   Collection Time: 05/31/15  5:49 AM  Result Value Ref Range   Glucose-Capillary 113 (H) 65 - 99 mg/dL  Basic metabolic panel     Status: Abnormal   Collection Time: 05/31/15 12:58 PM  Result Value Ref Range   Sodium 126 (L) 135 - 145 mmol/L   Potassium 5.2 (H) 3.5 - 5.1 mmol/L   Chloride 90 (L) 101 - 111 mmol/L   CO2 28 22 - 32 mmol/L   Glucose, Bld 119 (H) 65 - 99 mg/dL   BUN 18 6 - 20 mg/dL   Creatinine, Ser 1.19 (H) 0.44 - 1.00 mg/dL   Calcium 9.0 8.9 - 10.3 mg/dL   GFR calc non Af Amer 38 (L) >60 mL/min   GFR calc Af Amer 44 (L) >60 mL/min    Comment: (NOTE) The eGFR has been calculated using the CKD EPI  equation. This calculation has not been validated in all clinical situations. eGFR's persistently <60 mL/min signify possible Chronic Kidney Disease.    Anion gap 8 5 - 15  CBC with Differential/Platelet     Status: Abnormal   Collection Time: 06/01/15  5:11 AM  Result Value Ref Range   WBC 7.6 4.0 - 10.5 K/uL   RBC 5.13 (H) 3.87 - 5.11 MIL/uL   Hemoglobin 16.4 (H) 12.0 - 15.0 g/dL   HCT 46.7 (H) 36.0 - 46.0 %   MCV 91.0 78.0 - 100.0 fL   MCH 32.0 26.0 - 34.0 pg   MCHC 35.1 30.0 - 36.0 g/dL   RDW 17.3 (H) 11.5 - 15.5 %   Platelets 397 150 - 400 K/uL   Neutrophils Relative % 69 43 - 77 %   Neutro Abs 5.3 1.7 - 7.7 K/uL   Lymphocytes Relative 10 (L) 12 - 46 %   Lymphs Abs 0.7 0.7 - 4.0 K/uL   Monocytes Relative 18 (H) 3 - 12 %   Monocytes Absolute 1.4 (H) 0.1 - 1.0 K/uL   Eosinophils Relative 3 0 - 5 %   Eosinophils Absolute 0.2 0.0 - 0.7 K/uL   Basophils  Relative 0 0 - 1 %   Basophils Absolute 0.0 0.0 - 0.1 K/uL  Comprehensive metabolic panel     Status: Abnormal   Collection Time: 06/01/15  5:11 AM  Result Value Ref Range   Sodium 127 (L) 135 - 145 mmol/L   Potassium 4.8 3.5 - 5.1 mmol/L   Chloride 89 (L) 101 - 111 mmol/L   CO2 27 22 - 32 mmol/L   Glucose, Bld 92 65 - 99 mg/dL   BUN 22 (H) 6 - 20 mg/dL   Creatinine, Ser 0.99 0.44 - 1.00 mg/dL   Calcium 9.0 8.9 - 10.3 mg/dL   Total Protein 6.4 (L) 6.5 - 8.1 g/dL   Albumin 2.4 (L) 3.5 - 5.0 g/dL   AST 23 15 - 41 U/L   ALT 18 14 - 54 U/L   Alkaline Phosphatase 89 38 - 126 U/L   Total Bilirubin 0.9 0.3 - 1.2 mg/dL   GFR calc non Af Amer 48 (L) >60 mL/min   GFR calc Af Amer 55 (L) >60 mL/min    Comment: (NOTE) The eGFR has been calculated using the CKD EPI equation. This calculation has not been validated in all clinical situations. eGFR's persistently <60 mL/min signify possible Chronic Kidney Disease.    Anion gap 11 5 - 15  Magnesium     Status: None   Collection Time: 06/01/15  5:11 AM  Result Value Ref Range   Magnesium 1.8 1.7 - 2.4 mg/dL    Vitals: Blood pressure 175/58, pulse 83, temperature 98 F (36.7 C), temperature source Oral, resp. rate 18, height 5' 7" (1.702 m), weight 62.642 kg (138 lb 1.6 oz), SpO2 97 %.  Risk to Self: Is patient at risk for suicide?: No Risk to Others:   Prior Inpatient Therapy:   Prior Outpatient Therapy:    Current Facility-Administered Medications  Medication Dose Route Frequency Provider Last Rate Last Dose  . acetaminophen (TYLENOL) tablet 650 mg  650 mg Oral Q6H PRN Ivor Costa, MD   650 mg at 05/28/15 0302   Or  . acetaminophen (TYLENOL) suppository 650 mg  650 mg Rectal Q6H PRN Ivor Costa, MD      . alum & mag hydroxide-simeth (MAALOX/MYLANTA) 200-200-20 MG/5ML suspension 30 mL  30 mL Oral Q6H PRN  Ivor Costa, MD      . aspirin EC tablet 81 mg  81 mg Oral Daily Ivor Costa, MD   81 mg at 06/01/15 1113  . atorvastatin (LIPITOR) tablet  80 mg  80 mg Oral Q Mon-1800 Ivor Costa, MD   80 mg at 05/29/15 1730  . calcium-vitamin D (OSCAL WITH D) 500-200 MG-UNIT per tablet 1 tablet  1 tablet Oral BID Ivor Costa, MD   1 tablet at 06/01/15 1113  . demeclocycline (DECLOMYCIN) tablet 300 mg  300 mg Oral 4 times per day Delfina Redwood, MD      . feeding supplement (ENSURE ENLIVE) (ENSURE ENLIVE) liquid 237 mL  237 mL Oral BID BM Ivor Costa, MD   237 mL at 06/01/15 1000  . ferrous sulfate tablet 325 mg  325 mg Oral Q breakfast Geradine Girt, DO   325 mg at 06/01/15 1114  . heparin injection 5,000 Units  5,000 Units Subcutaneous 3 times per day Ivor Costa, MD   5,000 Units at 06/01/15 0533  . hydrALAZINE (APRESOLINE) injection 5 mg  5 mg Intravenous Q2H PRN Ivor Costa, MD   5 mg at 05/28/15 0705  . hydrALAZINE (APRESOLINE) tablet 10 mg  10 mg Oral 3 times per day Caren Griffins, MD   10 mg at 06/01/15 0533  . irbesartan (AVAPRO) tablet 150 mg  150 mg Oral Daily Ivor Costa, MD   150 mg at 06/01/15 1113  . labetalol (NORMODYNE,TRANDATE) injection 5 mg  5 mg Intravenous Once Ivor Costa, MD   5 mg at 05/26/15 0212  . metoprolol tartrate (LOPRESSOR) tablet 25 mg  25 mg Oral BID Ivor Costa, MD   25 mg at 06/01/15 1113  . multivitamin with minerals tablet 1 tablet  1 tablet Oral Daily Ivor Costa, MD   1 tablet at 06/01/15 1114  . nitroGLYCERIN (NITROSTAT) SL tablet 0.4 mg  0.4 mg Sublingual Q5 min PRN Ivor Costa, MD      . ondansetron [facility]) injection 4 mg  4 mg Intravenous Q8H PRN Ivor Costa, MD   4 mg at 05/29/15 1037  . pantoprazole (PROTONIX) EC tablet 40 mg  40 mg Oral Daily Ivor Costa, MD   40 mg at 06/01/15 1000  . sodium chloride 0.9 % injection 3 mL  3 mL Intravenous Q12H Ivor Costa, MD   3 mL at 06/01/15 1000    Musculoskeletal: Strength & Muscle Tone: decreased Gait & Station: unable to stand Patient leans: N/A  Psychiatric Specialty Exam: Physical Exam as per history and physical   ROS shortness of breath, pain in her hip and foot No  Fever-chills, No Headache, No changes with Vision or hearing, reports vertigo No problems swallowing food or Liquids, No Chest pain, Cough or Shortness of Breath, No Abdominal pain, No Nausea or Vommitting, Bowel movements are regular, No Blood in stool or Urine, No dysuria, No new skin rashes or bruises, No new joints pains-aches,  No new weakness, tingling, numbness in any extremity, No recent weight gain or loss, No polyuria, polydypsia or polyphagia,   A full 10 point Review of Systems was done, except as stated above, all other Review of Systems were negative.  Blood pressure 175/58, pulse 83, temperature 98 F (36.7 C), temperature source Oral, resp. rate 18, height 5' 7" (1.702 m), weight 62.642 kg (138 lb 1.6 oz), SpO2 97 %.Body mass index is 21.62 kg/(m^2).  General Appearance: Casual  Eye Contact::  Good  Speech:  Clear and  Coherent and Slow  Volume:  Decreased  Mood:  Anxious and Depressed  Affect:  Constricted and Depressed  Thought Process:  Coherent and Goal Directed  Orientation:  Full (Time, Place, and Person)  Thought Content:  WDL  Suicidal Thoughts:  No  Homicidal Thoughts:  No  Memory:  Immediate;   Fair Recent;   Fair  Judgement:  Fair  Insight:  Fair  Psychomotor Activity:  Decreased  Concentration:  Fair  Recall:  [employer] of Knowledge:Fair  Language: Good  Akathisia:  Negative  Handed:  Right  AIMS (if indicated):     Assets:  Communication Skills Desire for Improvement Financial Resources/Insurance Housing Leisure Time Resilience Social Support  ADL's:  Impaired  Cognition: Impaired,  Mild  Sleep:      Medical Decision Making: Review of Psycho-Social Stressors (1), Review or order clinical lab tests (1), Established Problem, Worsening (2), Review of Last Therapy Session (1), Review or order medicine tests (1), Review of Medication Regimen & Side Effects (2) and Review of New Medication or Change in Dosage (2)  Treatment Plan Summary:  Patient has been suffering with the cognitive deficits and lack of consistent support system at home, patient benefit from the out-of-home placement for rehabilitation. Daily contact with patient to assess and evaluate symptoms and progress in treatment and Medication management  Plan:  Patient does not meet criteria for capacity to make her own medical decisions and living arrangements based on my evaluation today.  Patient does not meet criteria for psychiatric inpatient admission. Supportive therapy provided about ongoing stressors.  Disposition: Patient benefit out of home placement as she hasn't no support system at home and has been more dependent on other people.   JONNALAGADDA,JANARDHAHA R. 06/01/2015 11:18 AM

## 2015-06-01 NOTE — Progress Notes (Addendum)
[facility] PROGRESS NOTE  Becky Morales PJK:[id number] DOB: [birth year] DOA: 05/25/2015 PCP: Garret Reddish, MD Summary 81 y.o. BF PMHx of hypertension, hyperlipidemia, GERD, CAD (S/P stent placement to 2012), carcinoid tumor (not know the detail), HTN,Diastolic congestive heart failure, CAD native artery, murmur 11/12 LVEF =55-60%, hyponatremia.  Presents with increased urinary frequency and altered mental status. Patient is accompanied by her niece. Per her niece, she was found in her bed and was unable to get woken up by her niece. Patient seems to be more confused than her baseline. Patient reports increased urinary frequency recently, but no dysuria or burning on urination. No nausea, vomiting, abdominal pain, diarrhea. She has pain to her right low back for the past week. No unilateral weakness, numbness, tingling sensations, urinary incontinence or loss of control for bowel movement. She denies any falls or injuries. Patient does not have fever, chills, chest pain, cough, shortness of breath, leg edema, rashes.  In ED, patient was found to have positive urinalysis for UTI, lipase 75, temperature normal, no tachycardia, hyponatremia sodium 128, creatinine 1.07. Chest x-ray negative for acute issues. CT head is negative for acute abnormalities. X-ray of lumbar spine is negative for acute abnormalities, but showed mild left convex thoracolumbar scoliosis and mild underlying degenerative change. CT per renal stone protocol is negative for kidney stone, but showed gallstone without cholecystitis. Patient is admitted to inpatient for further evaluation and treatment.    Assessment/Plan: UTI (lower urinary tract infection) - Although patient does not meet guidelines for sepsis, with a lactic acidosis in a 81 year old will treat sepsis, patient may not express fever, leukocytosis. -  positive AEROCOCCUS URINAE--treated -Echocardiogram; no significant decline when compared to echocardiogram from  2014 see results below - Ceftriaxone IV  completed on 8/9 -PT/OT recommends SNF; BASED ON MY EXAM TODAY, DOES NOT HAVE CAPACITY FOR INFORMED CONSENT.  WILL CONSULT PSYCHIATRY TO CONFIRM CAPACITY.  Lactic acidosis -Resolved with hydration and antibiotics  Acute encephalopathy:  Appears encephalopathic currently.  Will repeat urinalysis. He has completed antibiotics course. Hold any sedating medications  -Hyponatremia:  - This seems to be chronic issue. Na was 130on 04/06/15, slightly worsening.  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  Sodium 127. Due to continued encephalopathy. We'll continue fluid restriction and add demeclocycline to see whether this helps her mental status.   Essential hypertension:  Blood pressure increasing. Will adjust antihypertensives.  GERD: - Protonix  Back pain / hip:  - X-ray of lumbar has no acute bony abnormalities. Patient is known to have spinal stenosis of lumbar region at multiple levels, no alarming symptoms, such as leg weakness or urinary incontinence.  CAD (coronary artery disease)in native artery: - No chest pain.  - Aspirin, metoprolol, when necessary nitroglycerin SL  HLD:  -Last LDL was 64 on 10/05/14. - Continue Lipitor 80 mg daily  Chronic diastolic heart failure:  - 2-D echo on 02/05/13 showed EF of 60-65% with grade 1 diastolic dysfunction. Patient is not on diuretics at home. CHF is compensated. No leg edema. - Continue aspirin and metoprolol -echocardiogram: Compared to the prior study, there has been no significantinterval change.   Code Status:  DO NOT RESUSCITATE Family Communication: none (indicate person spoken with, relationship, and if by phone, the number) Disposition Plan: physical therapy recommends skilled nursing facility. I agree. It does not appear patient has capacity based on current physical examination. She is not safe to live alone.    Consultants:    Procedures: 8/8 echocardiogram;- Left ventricle:  moderate concentric hypertrophy.-LVEF=  55% to 60%. - (grade 1 diastolic dysfunction).- Aortic valve: mild to moderate regurgitation.  8/8 PCXR;Pulmonary vascular prominence most notable centrally without change.    Cultures 8/4 urine positive AEROCOCCUS URINAE 8/4 blood right arm/left hand NGTD   Antibiotics: Ceftriaxone 8/4>> stopped 8/9     DVT prophylaxis Subcutaneous heparin   subjective: Won't answer any questions.   Objective: Filed Vitals:   05/31/15 0824 05/31/15 1415 05/31/15 2031 06/01/15 0430  BP: 136/65 177/60 150/53 175/58  Pulse: 91 81 85 83  Temp: 98.6 F (37 C) 98 F (36.7 C) 98.7 F (37.1 C) 98 F (36.7 C)  TempSrc: Oral Oral Oral Oral  Resp: 20 18 18 18   Height:      Weight:    62.642 kg (138 lb 1.6 oz)  SpO2: 94% 99% 97% 97%    Intake/Output Summary (Last 24 hours) at 06/01/15 1042 Last data filed at 06/01/15 0835  Gross per 24 hour  Intake    360 ml  Output    500 ml  Net   -140 ml   Filed Weights   05/30/15 0721 05/31/15 0501 06/01/15 0430  Weight: 61.644 kg (135 lb 14.4 oz) 62.37 kg (137 lb 8 oz) 62.642 kg (138 lb 1.6 oz)     Exam: General: asleep in chair. Opens her eyes briefly, but quickly falls back asleep. Won't answer any questions. Cardiovascular: Regular rate and rhythm, positive systolic murmur grade 2/6, negative gallop or rub normal S1 and S2 Lungs: Clear to auscultation bilaterally without wheezes rhonchi or rales  Abdomen:negative abdominal pain, negative dysphagia, nondistended, positive soft, bowel sounds, no rebound, no ascites, no appreciable mass, negative CVA tenderness Neurologic:   no focal deficits    Data Reviewed: Basic Metabolic Panel:  Recent Labs Lab 05/28/15 0543 05/29/15 0419 05/30/15 0431 05/31/15 0028 05/31/15 0342 05/31/15 1258 06/01/15 0511  NA 132* 130* 126* 124* 125* 126* 127*  K 3.5 5.6* 4.6 4.8 4.6 5.2* 4.8  CL 104 97* 92* 90* 91* 90* 89*  CO2 23 26 25 24 25 28 27   GLUCOSE 83 108*  113* 125* 101* 119* 92  BUN 16 19 13 19 19 18  22*  CREATININE 0.94 1.10* 0.96 1.04* 1.01* 1.19* 0.99  CALCIUM 7.5* 9.0 8.8* 8.8* 8.6* 9.0 9.0  MG 1.4* 1.7 1.7  --  1.8  --  1.8   Liver Function Tests:  Recent Labs Lab 05/28/15 0543 05/29/15 0419 05/30/15 0431 05/31/15 0342 06/01/15 0511  AST 23 28 22 21 23   ALT 14 18 14 15 18   ALKPHOS 84 106 104 93 89  BILITOT 1.2 0.9 1.0 0.8 0.9  PROT 5.2* 7.1 6.1* 6.4* 6.4*  ALBUMIN 2.3* 2.8* 2.6* 2.4* 2.4*    Recent Labs Lab 05/25/15 1743  LIPASE 35   No results for input(s): AMMONIA in the last 168 hours. CBC:  Recent Labs Lab 05/28/15 0543 05/29/15 0419 05/30/15 0431 05/31/15 0342 06/01/15 0511  WBC 5.2 6.8 8.9 10.3 7.6  NEUTROABS 3.6 4.7 6.9 7.4 5.3  HGB 16.2* 18.0* 17.3* 16.4* 16.4*  HCT 45.3 50.8* 49.0* 46.7* 46.7*  MCV 88.3 90.9 90.7 90.0 91.0  PLT 357 390 375 403* 397   Cardiac Enzymes: No results for input(s): CKTOTAL, CKMB, CKMBINDEX, TROPONINI in the last 168 hours. BNP (last 3 results)  Recent Labs  04/07/15 1130 05/25/15 2221  BNP 306.0* 154.7*    ProBNP (last 3 results)  Recent Labs  08/03/14 1643  PROBNP 181.0*    CBG:  Recent  Labs Lab 05/26/15 0811 05/27/15 0606 05/28/15 0542 05/29/15 0636 05/31/15 0549  GLUCAP 81 95 87 107* 113*    Recent Results (from the past 240 hour(s))  Urine culture     Status: None   Collection Time: 05/25/15  7:06 PM  Result Value Ref Range Status   Specimen Description URINE, CLEAN CATCH  Final   Special Requests NONE  Final   Culture >=100,000 COLONIES/mL AEROCOCCUS URINAE  Final   Report Status 05/27/2015 FINAL  Final  Culture, blood (routine x 2)     Status: None   Collection Time: 05/25/15 10:27 PM  Result Value Ref Range Status   Specimen Description BLOOD RIGHT ARM  Final   Special Requests   Final    BOTTLES DRAWN AEROBIC AND ANAEROBIC [place] BLUE 3CC RED   Culture NO GROWTH 5 DAYS  Final   Report Status 05/31/2015 FINAL  Final  Culture, blood  (routine x 2)     Status: None   Collection Time: 05/26/15  2:02 AM  Result Value Ref Range Status   Specimen Description BLOOD LEFT HAND  Final   Special Requests BOTTLES DRAWN AEROBIC ONLY 1CC  Final   Culture NO GROWTH 5 DAYS  Final   Report Status 05/31/2015 FINAL  Final     Studies: No results found.  Scheduled Meds: . aspirin EC  81 mg Oral Daily  . atorvastatin  80 mg Oral Q Mon-1800  . calcium-vitamin D  1 tablet Oral BID  . feeding supplement (ENSURE ENLIVE)  237 mL Oral BID BM  . ferrous sulfate  325 mg Oral Q breakfast  . heparin  5,000 Units Subcutaneous 3 times per day  . hydrALAZINE  10 mg Oral 3 times per day  . irbesartan  150 mg Oral Daily  . labetalol  5 mg Intravenous Once  . metoprolol tartrate  25 mg Oral BID  . multivitamin with minerals  1 tablet Oral Daily  . pantoprazole  40 mg Oral Daily  . sodium chloride  3 mL Intravenous Q12H   Continuous Infusions:    Principal Problem:   UTI (lower urinary tract infection) Active Problems:   Malignant carcinoid tumor of unknown primary site   Essential hypertension   Allergic rhinitis   GERD   Spinal stenosis of lumbar region at multiple levels   CAD (coronary artery disease)  s/p  PCI for NSTEMI. with angina.    Hyperlipidemia   DNR/DNI 07/21/14   Anxiety state   Hyponatremia   Chronic diastolic heart failure   Acute encephalopathy   Urinary tract infection   Back pain   Sepsis secondary to UTI   Lactic acidosis   Notalgia   CAD in native artery   HLD (hyperlipidemia)   Chronic diastolic CHF (congestive heart failure)   Aspiration into airway    Time spent:25 minutes    Signora Zucco L  [facility] [email or web address], password [facility] 06/01/2015, 10:42 AM  LOS: 7 days

## 2015-06-01 NOTE — Care Management Important Message (Signed)
Important Message  Patient Details  Name: ALYIA LACERTE MRN: [id number] Date of Birth: August 10, 1920   Medicare Important Message Given:  Yes-third notification given    Pricilla Handler 06/01/2015, 2:02 PM

## 2015-06-01 NOTE — Progress Notes (Signed)
UR completed 

## 2015-06-01 NOTE — Progress Notes (Signed)
Physical Therapy Treatment Patient Details Name: Becky Morales MRN: [id number] DOB: Feb 04, 1920 Today's Date: 06/01/2015    History of Present Illness Pt is is a 81 y.o. female with PMH of hypertension, hyperlipidemia, GERD, CAD (S/B stent placement to 2012), carcinoid tumor (not know the detail), diastolic congestive heart failure, who presents with increased urinary frequency and altered mental status.    PT Comments    1st attempt pt refused saying she was too weak. Saw MD in room later and at this time, the pt was not speaking to MD, PT or aide.  Discussed with them as well as nurse current status and d/c recommendation.  Returned later (~1 hour) and pt talking with PT and does not remember MD, PT and aide in room earlier.  Pt with episode of B knees buckeling with gait, although she was able to ambulate into the hallway today.  Con't to recommend SNF and pt continues to feel she can take care of herself at home with aides.  PT educated pt that aides are not there 24 hours/day.  Although pt verbalizes that she is weak, she seems unable to translate this into needing increased help at home.  If pt does refuse SNF and goes home, she will need HHPT and maximum services available, but con't to feel that d/c home is not a safe option.  Follow Up Recommendations  SNF;Supervision/Assistance - 24 hour     Equipment Recommendations  Rolling walker with 5" wheels    Recommendations for Other Services       Precautions / Restrictions Precautions Precautions: Fall    Mobility  Bed Mobility               General bed mobility comments: Pt up in recliner upon arrival.  Transfers Overall transfer level: Needs assistance Equipment used: Rolling walker (2 wheeled) Transfers: Sit to/from Stand Sit to Stand: Min guard         General transfer comment: from recliner, chair in hallway, and BSC- cues for hand placement  Ambulation/Gait Ambulation/Gait assistance: Min assist;Mod  assist Ambulation Distance (Feet): 28 Feet (x 2) Assistive device: Rolling walker (2 wheeled) Gait Pattern/deviations: Decreased step length - left;Decreased step length - right;Shuffle;Trunk flexed Gait velocity: decreased   General Gait Details: Pt ambulated with RW and then legs began to buckle.  Able to balance self with MOD A then took a few more steps to computer chair to rest.  After resting several minutes, returned to her room with RW.     Stairs            Wheelchair Mobility    Modified Rankin (Stroke Patients Only)       Balance     Sitting balance-Leahy Scale: Fair     Standing balance support: Bilateral upper extremity supported Standing balance-Leahy Scale: Poor Standing balance comment: requires UE support                    Cognition Arousal/Alertness: Awake/alert Behavior During Therapy: WFL for tasks assessed/performed Overall Cognitive Status: No family/caregiver present to determine baseline cognitive functioning           Safety/Judgement: Decreased awareness of deficits;Decreased awareness of safety     General Comments: Poor insight into deficits and ability to care for self at home.    Exercises      General Comments General comments (skin integrity, edema, etc.): Used BSC during session.  Coughing and bringing up sputum.      Pertinent Vitals/Pain  Pain Assessment: No/denies pain    Home Living                      Prior Function            PT Goals (current goals can now be found in the care plan section) Acute Rehab PT Goals Patient Stated Goal: Pt refusing SNF, thinks she can manage at home PT Goal Formulation: With patient Time For Goal Achievement: 06/08/15 Potential to Achieve Goals: Good Progress towards PT goals: Progressing toward goals    Frequency  Min 3X/week    PT Plan Current plan remains appropriate    Co-evaluation             End of Session Equipment Utilized During  Treatment: Gait belt Activity Tolerance: Patient limited by fatigue Patient left: in chair;with call bell/phone within reach;with chair alarm set     Time: 1130-1157 PT Time Calculation (min) (ACUTE ONLY): 27 min  Charges:  $Gait Training: 8-22 mins $Therapeutic Activity: 8-22 mins                    G Codes:      Becky Morales 06/01/2015, 12:45 PM

## 2015-06-02 DIAGNOSIS — E871 Hypo-osmolality and hyponatremia: Secondary | ICD-10-CM

## 2015-06-02 LAB — BASIC METABOLIC PANEL
Anion gap: 9 (ref 5–15)
BUN: 21 mg/dL — AB (ref 6–20)
CO2: 26 mmol/L (ref 22–32)
CREATININE: 0.94 mg/dL (ref 0.44–1.00)
Calcium: 8.7 mg/dL — ABNORMAL LOW (ref 8.9–10.3)
Chloride: 90 mmol/L — ABNORMAL LOW (ref 101–111)
GFR, EST AFRICAN AMERICAN: 59 mL/min — AB (ref >60)
GFR, EST NON AFRICAN AMERICAN: 51 mL/min — AB (ref >60)
Glucose, Bld: 93 mg/dL (ref 65–99)
Potassium: 4.5 mmol/L (ref 3.5–5.1)
Sodium: 125 mmol/L — ABNORMAL LOW (ref 135–145)

## 2015-06-02 LAB — URINALYSIS, ROUTINE W REFLEX MICROSCOPIC
Bilirubin Urine: NEGATIVE
Glucose, UA: NEGATIVE mg/dL
Hgb urine dipstick: NEGATIVE
KETONES UR: NEGATIVE mg/dL
Leukocytes, UA: NEGATIVE
Nitrite: NEGATIVE
PROTEIN: NEGATIVE mg/dL
Specific Gravity, Urine: 1.014 (ref 1.005–1.030)
Urobilinogen, UA: 0.2 mg/dL (ref 0.0–1.0)
pH: 6.5 (ref 5.0–8.0)

## 2015-06-02 LAB — GLUCOSE, CAPILLARY: Glucose-Capillary: 96 mg/dL (ref 65–99)

## 2015-06-02 MED ORDER — DEMECLOCYCLINE HCL 150 MG PO TABS: 300.0000 mg | ORAL_TABLET | Freq: Two times a day (BID) | ORAL | Status: DC

## 2015-06-02 MED ORDER — METOPROLOL TARTRATE 50 MG PO TABS: 50.0000 mg | ORAL_TABLET | Freq: Two times a day (BID) | ORAL | Status: AC

## 2015-06-02 MED ORDER — ACETAMINOPHEN 325 MG PO TABS: 650.0000 mg | ORAL_TABLET | Freq: Four times a day (QID) | ORAL | Status: DC | PRN

## 2015-06-02 MED ORDER — VALSARTAN 320 MG PO TABS: 320.0000 mg | ORAL_TABLET | Freq: Every day | ORAL | Status: AC

## 2015-06-02 NOTE — Clinical Social Work Placement (Signed)
   CLINICAL SOCIAL WORK PLACEMENT  NOTE  Date:  06/02/2015  Patient Details  Name: Becky Morales MRN: [id number] Date of Birth: 1920/02/20  Clinical Social Work is seeking post-discharge placement for this patient at the [place] level of care (*[name] will initial, date and re-position this form in  chart as items are completed):  Yes   Patient/family provided with [facility] Work Department's list of facilities offering this level of care within the geographic area requested by the patient (or if unable, by the patient's family).  Yes   Patient/family informed of their freedom to choose among providers that offer the needed level of care, that participate in Medicare, Medicaid or managed care program needed by the patient, have an available bed and are willing to accept the patient.  Yes   Patient/family informed of [facility]'s ownership interest in [facility] and [facility], as well as of the fact that they are under no obligation to receive care at these facilities.  PASRR submitted to EDS on 06/02/15     PASRR number received on 06/02/15     Existing PASRR number confirmed on       FL2 transmitted to all facilities in geographic area requested by pt/family on 06/02/15     FL2 transmitted to all facilities within larger geographic area on       Patient informed that his/her managed care company has contracts with or will negotiate with certain facilities, including the following:   [facility])         Patient/family informed of bed offers received.  Patient chooses bed at       Physician recommends and patient chooses bed at      Patient to be transferred to   on  .  Patient to be transferred to facility by Ambulance [facility])     Patient family notified on   of transfer.  Name of family member notified:        PHYSICIAN Please prepare priority discharge summary, including medications, Please sign FL2,  Please sign DNR, Please prepare prescriptions     Additional Comment:    _______________________________________________ Williemae Area, LCSW 06/02/2015, 8:50 PM

## 2015-06-02 NOTE — Progress Notes (Signed)
[name] notified today that patient is now agreeable to short term rehab.  Hospital Psychiatrist has stated that patient does not have capacity at this time to make her own medical decisions.  [name] has attempted to reach patient's HCPOA [name] or her niece [name] throughout the day to determine which facility to place patient as MD states that patient is medically stable for d/c.  Patient states that she wants to wait until [name] comes to the hospital (after 7 pm) and then decide.  [name] continued attempted to reach [facility] and was unsuccessful until about 7:30 this evening. Discussed above and she stated that per her discussion earlier with patient- she would prefer [facility] if possible.  SNF search is in place and will ask weekend [name] to follow up in the morning with [facility] as they currently have not offered a bed.  Will plan d/c to SNF tomorrow - preferably to [facility]. [name]  Notified unit RN of above.  Lorie Phenix. Pauline Good, [place]

## 2015-06-02 NOTE — Discharge Summary (Signed)
Physician Discharge Summary  Becky Morales EHM:[id number] DOB: 1920-08-20 DOA: 05/25/2015  PCP: Garret Reddish, MD  Admit date: 05/25/2015 Discharge date: 06/02/2015  Time spent: greater than 30 minutes  Recommendations for Outpatient Follow-up:  1. To SNF 2. Monitor sodium 3. Fluid restriction 1200 mL daily  Discharge Diagnoses:  Principal Problem:   UTI (lower urinary tract infection) Active Problems:   Malignant carcinoid tumor of unknown primary site   Essential hypertension   Allergic rhinitis   GERD   Spinal stenosis of lumbar region at multiple levels   CAD (coronary artery disease)  s/p  PCI for NSTEMI. with angina.    Hyperlipidemia   DNR/DNI 07/21/14   Anxiety state   Hyponatremia   Chronic diastolic heart failure   Acute encephalopathy   Back pain   Sepsis secondary to UTI   Lactic acidosis  Discharge Condition: stable  Diet recommendation: General. Fluid restriction 1200 mL a day  Filed Weights   05/31/15 0501 06/01/15 0430 06/02/15 0628  Weight: 62.37 kg (137 lb 8 oz) 62.642 kg (138 lb 1.6 oz) 62.007 kg (136 lb 11.2 oz)    History of present illness:  81 y.o. female with PMH of hypertension, hyperlipidemia, GERD, CAD (S/B stent placement to 2012), carcinoid tumor (not know the detail), diastolic congestive heart failure, who presents with increased urinary frequency and altered mental status.  Patient is accompanied by her niece. Per her niece, she was found in her bed and was unable to get woken up by her niece. Patient seems to be more confused than her baseline. Patient reports increased urinary frequency recently, but no dysuria or burning on urination. No nausea, vomiting, abdominal pain, diarrhea. She has pain to her right low back for the past week. No unilateral weakness, numbness, tingling sensations, urinary incontinence or loss of control for bowel movement. She denies any falls or injuries. Patient does not have fever, chills, chest pain, cough, shortness  of breath, leg edema, rashes. When I saw patient in ED, she looks tired, her mental status already improved per her niece.   In ED, patient was found to have positive urinalysis for UTI, lipase 75, temperature normal, no tachycardia, hyponatremia sodium 128, creatinine 1.07. Chest x-ray negative for acute issues. CT head is negative for acute abnormalities. X-ray of lumbar spine is negative for acute abnormalities, but showed mild left convex thoracolumbar scoliosis and mild underlying degenerative change. CT per renal stone protocol is negative for kidney stone, but showed gallstone without cholecystitis. Patient is admitted to inpatient for further evaluation and treatment.  Hospital Course:  Admitted. Fluid resuscitated. Started on empiric Rocephin.  UTI (lower urinary tract infection) - positive AEROCOCCUS URINAE--treated - Ceftriaxone IV completed on 8/9 -PT/OT recommends SNF; BASED ON MY EXAM TODAY, DOES NOT HAVE CAPACITY FOR INFORMED CONSENT.  Psychiatry consulted and agrees patient lacks capacity. Nevertheless, patient is agreeable to go to skilled nursing facility today. Healthcare power of attorney contacted by social worker  Lactic acidosis -Resolved with hydration and antibiotics  Acute encephalopathy:   Secondary to UTI and hyponatremia. repeat urinalysis ordered but not yet collected. has completed antibiotics course. Hold any sedating medications.    -Hyponatremia:   Chronic. 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 Sodium has been stable in the 125-127 range.  continue fluid restriction.  Demeclocycline added. Would continue for about 5 more days and then reassess continued need for same.   Essential hypertension:  Blood pressure uncontrolled. Home antihypertensives increased.  GERD: - Protonix  Back pain / hip:  -  X-ray of lumbar has no acute bony abnormalities. Patient is known to have spinal stenosis of lumbar region at multiple levels, no alarming symptoms, such as leg  weakness or urinary incontinence.  CAD (coronary artery disease)in native artery: - No chest pain.  - Aspirin, metoprolol, when necessary nitroglycerin SL  HLD:  -Last LDL was 64 on 10/05/14. - Continue Lipitor 80 mg daily  Chronic diastolic heart failure:  - 2-D echo on 02/05/13 showed EF of 60-65% with grade 1 diastolic dysfunction. Patient is not on diuretics at home. CHF is compensated. No leg edema. - Continue aspirin and metoprolol -echocardiogram: Compared to the prior study, there has been no significantinterval change.   Code Status: DO NOT RESUSCITATE Family Communication: none (indicate person spoken with, relationship, and if by phone, the number) Disposition Plan: physical therapy recommends skilled nursing facility. I agree. It does not appear patient has capacity based on current physical examination. She is not safe to live alone.   Procedures: None  Consultations:  Psychiatry  Discharge Exam: Filed Vitals:   06/02/15 0628  BP: 154/64  Pulse: 70  Temp: 97.5 F (36.4 C)  Resp: 20    General: Asleep in chair. Arousable. Disoriented to time. Forgetful. Cooperative. Cardiovascular: Regular rate rhythm without murmurs gallops rubs Respiratory: Clear to auscultation bilaterally without wheezes rhonchi or rales  Discharge Instructions   Discharge Instructions    Diet general    Complete by:  As directed      Discharge instructions    Complete by:  As directed   Fluid restrict 1200 cc per day     Walk with assistance    Complete by:  As directed           Current Discharge Medication List    START taking these medications   Details  acetaminophen (TYLENOL) 325 MG tablet Take 2 tablets (650 mg total) by mouth every 6 (six) hours as needed for mild pain (or Fever >/= 101).    demeclocycline (DECLOMYCIN) 150 MG tablet Take 2 tablets (300 mg total) by mouth 2 (two) times daily. For 5 days      CONTINUE these medications which have CHANGED    Details  metoprolol tartrate (LOPRESSOR) 50 MG tablet Take 1 tablet (50 mg total) by mouth 2 (two) times daily.   Associated Diagnoses: Atherosclerosis of native coronary artery of native heart without angina pectoris    valsartan (DIOVAN) 320 MG tablet Take 1 tablet (320 mg total) by mouth daily.   Associated Diagnoses: Atherosclerosis of native coronary artery of native heart without angina pectoris      CONTINUE these medications which have NOT CHANGED   Details  aspirin 81 MG tablet Take 81 mg by mouth daily.     atorvastatin (LIPITOR) 80 MG tablet Take 80 mg by mouth once a week. Take on Mondays    calcium-vitamin D (OSCAL WITH D) 500-200 MG-UNIT per tablet Take 1 tablet by mouth 2 (two) times daily.    feeding supplement, ENSURE ENLIVE, (ENSURE ENLIVE) LIQD Take 237 mLs by mouth 2 (two) times daily between meals. Qty: 237 mL, Refills: 12    Multiple Vitamin (MULTI VITAMIN DAILY) TABS Take 1 tablet by mouth daily. Pt. Not sure of dose.    nitroGLYCERIN (NITROSTAT) 0.4 MG SL tablet Place 1 tablet (0.4 mg total) under the tongue every 5 (five) minutes as needed for chest pain. Qty: 25 tablet, Refills: 12    pantoprazole (PROTONIX) 40 MG tablet Take 1 tablet (40 mg  total) by mouth daily. Qty: 90 tablet, Refills: 3   Associated Diagnoses: Hyperlipidemia      STOP taking these medications     IRON PO      cefUROXime (CEFTIN) 500 MG tablet        Allergies  Allergen Reactions  . Ciprofloxacin     emesis  . Paxil [Paroxetine Hcl] Nausea And Vomiting      The results of significant diagnostics from this hospitalization (including imaging, microbiology, ancillary and laboratory) are listed below for reference.    Significant Diagnostic Studies: Dg Chest 2 View  05/25/2015   CLINICAL DATA:  Back pain  EXAM: CHEST  2 VIEW  COMPARISON:  04/05/2015  FINDINGS: Mild cardiomegaly is stable. A coronary stent is noted. Stable mild aortic tortuosity and hila.  There is no edema,  consolidation, effusion, or pneumothorax.  Spondylosis and mild thoracolumbar scoliosis. No explanation for acute back pain.  IMPRESSION: No active cardiopulmonary disease.   Electronically Signed   By: Monte Fantasia M.D.   On: 05/25/2015 18:21   Dg Lumbar Spine Complete  05/25/2015   CLINICAL DATA:  Acute onset of lower back pain, particularly at the left sacrum. Patient unresponsive. Initial encounter.  EXAM: LUMBAR SPINE - COMPLETE 4+ VIEW  COMPARISON:  CT of the abdomen and pelvis from 04/01/2014, and lumbar spine radiographs performed 08/29/2011  FINDINGS: There is no evidence of fracture or subluxation. Vertebral bodies demonstrate normal height and alignment. Scattered lateral osteophytes are seen along the lower thoracic and lumbar spine. Mild left convex thoracolumbar scoliosis is noted. Intervertebral disc spaces are grossly preserved.  The visualized bowel gas pattern is unremarkable in appearance; air and stool are noted within the colon. The sacroiliac joints are within normal limits. Scattered vascular calcifications are seen.  IMPRESSION: 1. No evidence of fracture or subluxation along the lumbar spine. 2. Mild left convex thoracolumbar scoliosis noted; mild underlying degenerative change seen. 3. Scattered vascular calcifications seen.   Electronically Signed   By: Garald Balding M.D.   On: 05/25/2015 18:22   Ct Head Wo Contrast  05/25/2015   CLINICAL DATA:  Acute onset of unresponsiveness.  Initial encounter.  EXAM: CT HEAD WITHOUT CONTRAST  TECHNIQUE: Contiguous axial images were obtained from the base of the skull through the vertex without intravenous contrast.  COMPARISON:  CT of the head performed 04/05/2015  FINDINGS: There is no evidence of acute infarction, mass lesion, or intra- or extra-axial hemorrhage on CT.  Prominence of the ventricles and sulci reflects moderate cortical volume loss. Diffuse periventricular and subcortical white matter change likely reflects small vessel  ischemic microangiopathy. Cerebellar atrophy is noted.  The brainstem and fourth ventricle are within normal limits. The basal ganglia are unremarkable in appearance. The cerebral hemispheres demonstrate grossly normal gray-white differentiation. No mass effect or midline shift is seen.  There is no evidence of fracture; visualized osseous structures are unremarkable in appearance. The orbits are within normal limits. The paranasal sinuses and mastoid air cells are well-aerated. No significant soft tissue abnormalities are seen.  IMPRESSION: 1. No acute intracranial pathology seen on CT. 2. Moderate cortical volume loss and diffuse small vessel ischemic microangiopathy.   Electronically Signed   By: Garald Balding M.D.   On: 05/25/2015 18:31   Dg Chest Port 1 View  05/29/2015   CLINICAL DATA:  81 year old female with increased shortness of breath today. Subsequent encounter.  EXAM: PORTABLE CHEST - 1 VIEW  COMPARISON:  05/25/2015.  FINDINGS: Pulmonary vascular prominence most  notable centrally without significant change.  Persistently elevated right hemidiaphragm.  No segmental consolidation or pneumothorax.  Nodularity right lung may represent overlying structures as known nodules seen in this region on the recent exam.  Mild cardiomegaly.  Calcified tortuous aorta.  IMPRESSION: Pulmonary vascular prominence most notable centrally without change.  No segmental consolidation.  Cardiomegaly.  Calcified tortuous aorta.   Electronically Signed   By: Genia Del M.D.   On: 05/29/2015 16:48   Ct Renal Stone Study  05/25/2015   CLINICAL DATA:  Right-sided flank pain  EXAM: CT ABDOMEN AND PELVIS WITHOUT CONTRAST  TECHNIQUE: Multidetector CT imaging of the abdomen and pelvis was performed following the standard protocol without IV contrast.  COMPARISON:  04/01/2014  FINDINGS: Lower chest: Minimal subpleural dependent atelectasis. Moderate cardiomegaly. Atheromatous aortic and coronary arterial calcifications are  noted.  Hepatobiliary: Unenhanced CT was performed per clinician order. Lack of IV contrast limits sensitivity and specificity, especially for evaluation of abdominal/pelvic solid viscera. Unenhanced liver is grossly normal. Dependent stones are noted within the gallbladder.  Pancreas: Normal  Spleen: Normal  Adrenals/Urinary Tract: Adrenal glands are normal. No radiopaque renal, ureteral, or bladder calculus. No hydroureteronephrosis. Too small to characterize left mid renal cortical 8 mm hypodense lesion image 31.  Stomach/Bowel: Extensive diverticulosis without large bowel wall thickening. No bowel wall thickening or focal segmental dilatation is identified. Moderate stool burden. Appendix is normal.  Vascular/Lymphatic: Aortic ectasia noted with extensive vascular calcification but no aneurysm. Right lower quadrant 1 cm mesenteric node image 45 is nonspecific. No other lymphadenopathy.  Other: Uterus and ovaries are normal. Presumed sequela of previous trauma within the right buttock subcutaneous fat. No free air or fluid.  Musculoskeletal: Leftward curvature of the lumbar spine is noted. Bones are subjectively osteopenic with trabecular rarefaction. No compression deformity. Multilevel disc degenerative change.  IMPRESSION: No acute intra-abdominal or pelvic pathology.  Moderate stool burden.  Gallstones without other evidence for acute cholecystitis.   Electronically Signed   By: Conchita Paris M.D.   On: 05/25/2015 21:27   Dg Hip Unilat With Pelvis 2-3 Views Right  05/26/2015   CLINICAL DATA:  Right hip pain for several weeks.  EXAM: DG HIP (WITH OR WITHOUT PELVIS) 2-3V RIGHT  COMPARISON:  CT 04/01/2014  FINDINGS: Degenerative changes lumbar spine and both hips. Diffuse osteopenia. No acute bony abnormality.  IMPRESSION: Diffuse osteopenia and degenerative change.  No acute abnormality.   Electronically Signed   By: Marcello Moores  Register   On: 05/26/2015 08:46    Microbiology: Recent Results (from the past  240 hour(s))  Urine culture     Status: None   Collection Time: 05/25/15  7:06 PM  Result Value Ref Range Status   Specimen Description URINE, CLEAN CATCH  Final   Special Requests NONE  Final   Culture >=100,000 COLONIES/mL AEROCOCCUS URINAE  Final   Report Status 05/27/2015 FINAL  Final  Culture, blood (routine x 2)     Status: None   Collection Time: 05/25/15 10:27 PM  Result Value Ref Range Status   Specimen Description BLOOD RIGHT ARM  Final   Special Requests   Final    BOTTLES DRAWN AEROBIC AND ANAEROBIC [place] BLUE 3CC RED   Culture NO GROWTH 5 DAYS  Final   Report Status 05/31/2015 FINAL  Final  Culture, blood (routine x 2)     Status: None   Collection Time: 05/26/15  2:02 AM  Result Value Ref Range Status   Specimen Description BLOOD LEFT HAND  Final   Special Requests BOTTLES DRAWN AEROBIC ONLY 1CC  Final   Culture NO GROWTH 5 DAYS  Final   Report Status 05/31/2015 FINAL  Final     Labs: Basic Metabolic Panel:  Recent Labs Lab 05/28/15 0543 05/29/15 0419 05/30/15 0431 05/31/15 0028 05/31/15 0342 05/31/15 1258 06/01/15 0511 06/02/15 0314  NA 132* 130* 126* 124* 125* 126* 127* 125*  K 3.5 5.6* 4.6 4.8 4.6 5.2* 4.8 4.5  CL 104 97* 92* 90* 91* 90* 89* 90*  CO2 23 26 25 24 25 28 27 26   GLUCOSE 83 108* 113* 125* 101* 119* 92 93  BUN 16 19 13 19 19 18  22* 21*  CREATININE 0.94 1.10* 0.96 1.04* 1.01* 1.19* 0.99 0.94  CALCIUM 7.5* 9.0 8.8* 8.8* 8.6* 9.0 9.0 8.7*  MG 1.4* 1.7 1.7  --  1.8  --  1.8  --    Liver Function Tests:  Recent Labs Lab 05/28/15 0543 05/29/15 0419 05/30/15 0431 05/31/15 0342 06/01/15 0511  AST 23 28 22 21 23   ALT 14 18 14 15 18   ALKPHOS 84 106 104 93 89  BILITOT 1.2 0.9 1.0 0.8 0.9  PROT 5.2* 7.1 6.1* 6.4* 6.4*  ALBUMIN 2.3* 2.8* 2.6* 2.4* 2.4*   No results for input(s): LIPASE, AMYLASE in the last 168 hours. No results for input(s): AMMONIA in the last 168 hours. CBC:  Recent Labs Lab 05/28/15 0543 05/29/15 0419  05/30/15 0431 05/31/15 0342 06/01/15 0511  WBC 5.2 6.8 8.9 10.3 7.6  NEUTROABS 3.6 4.7 6.9 7.4 5.3  HGB 16.2* 18.0* 17.3* 16.4* 16.4*  HCT 45.3 50.8* 49.0* 46.7* 46.7*  MCV 88.3 90.9 90.7 90.0 91.0  PLT 357 390 375 403* 397   Cardiac Enzymes: No results for input(s): CKTOTAL, CKMB, CKMBINDEX, TROPONINI in the last 168 hours. BNP: BNP (last 3 results)  Recent Labs  04/07/15 1130 05/25/15 2221  BNP 306.0* 154.7*    ProBNP (last 3 results)  Recent Labs  08/03/14 1643  PROBNP 181.0*    CBG:  Recent Labs Lab 05/27/15 0606 05/28/15 0542 05/29/15 0636 05/31/15 0549 06/02/15 0623  GLUCAP 95 87 107* 113* 96       Signed:  Lizbett Garciagarcia L  [facility] 06/02/2015, 10:10 AM

## 2015-06-03 LAB — BASIC METABOLIC PANEL
Anion gap: 7 (ref 5–15)
BUN: 19 mg/dL (ref 6–20)
CO2: 27 mmol/L (ref 22–32)
CREATININE: 0.89 mg/dL (ref 0.44–1.00)
Calcium: 8.6 mg/dL — ABNORMAL LOW (ref 8.9–10.3)
Chloride: 89 mmol/L — ABNORMAL LOW (ref 101–111)
GFR calc non Af Amer: 54 mL/min — ABNORMAL LOW (ref >60)
Glucose, Bld: 103 mg/dL — ABNORMAL HIGH (ref 65–99)
Potassium: 4.5 mmol/L (ref 3.5–5.1)
SODIUM: 123 mmol/L — AB (ref 135–145)

## 2015-06-03 LAB — GLUCOSE, CAPILLARY: Glucose-Capillary: 93 mg/dL (ref 65–99)

## 2015-06-03 MED ORDER — LORAZEPAM 0.5 MG PO TABS
0.5000 mg | ORAL_TABLET | Freq: Once | ORAL | Status: AC
  Administered 2015-06-03: 0.5 mg via ORAL
  Filled 2015-06-03: qty 1

## 2015-06-03 MED ORDER — TOLVAPTAN 15 MG PO TABS
15.0000 mg | ORAL_TABLET | ORAL | Status: DC
  Administered 2015-06-03: 15 mg via ORAL
  Filled 2015-06-03 (×2): qty 1

## 2015-06-03 NOTE — Clinical Social Work Placement (Signed)
   CLINICAL SOCIAL WORK PLACEMENT  NOTE  Date:  06/03/2015  Patient Details  Name: Becky Morales MRN: [id number] Date of Birth: 05/31/1920  Clinical Social Work is seeking post-discharge placement for this patient at the [place] level of care (*[name] will initial, date and re-position this form in  chart as items are completed):  Yes   Patient/family provided with [place] Work Department's list of facilities offering this level of care within the geographic area requested by the patient (or if unable, by the patient's family).  Yes   Patient/family informed of their freedom to choose among providers that offer the needed level of care, that participate in Medicare, Medicaid or managed care program needed by the patient, have an available bed and are willing to accept the patient.  Yes   Patient/family informed of [facility]'s ownership interest in [facility] and [facility], as well as of the fact that they are under no obligation to receive care at these facilities.  PASRR submitted to EDS on 06/02/15     PASRR number received on 06/02/15     Existing PASRR number confirmed on       FL2 transmitted to all facilities in geographic area requested by pt/family on 06/02/15     FL2 transmitted to all facilities within larger geographic area on       Patient informed that his/her managed care company has contracts with or will negotiate with certain facilities, including the following:   [facility])     Yes   Patient/family informed of bed offers received.  Patient chooses bed at  [facility])     Physician recommends and patient chooses bed at      Patient to be transferred to  [facility]) on 06/03/15.  Patient to be transferred to facility by [facility])     Patient family notified on 06/03/15 of transfer.  Name of family member notified:   (cora hcpoa)     PHYSICIAN Please prepare priority discharge  summary, including medications, Please sign FL2, Please sign DNR, Please prepare prescriptions     Additional Comment:    _______________________________________________ Roanna Raider, LCSW 06/03/2015, 9:52 AM

## 2015-06-03 NOTE — Progress Notes (Signed)
[facility] can accept pt today.  Her HCPOA is en route to facility to complete paperwork and [name] has prepared paperwork and will call ambulance for transport.

## 2015-06-08 ENCOUNTER — Non-Acute Institutional Stay (SKILLED_NURSING_FACILITY): Payer: Medicare Other | Admitting: Internal Medicine

## 2015-06-08 DIAGNOSIS — N390 Urinary tract infection, site not specified: Secondary | ICD-10-CM

## 2015-06-08 DIAGNOSIS — A419 Sepsis, unspecified organism: Secondary | ICD-10-CM | POA: Diagnosis not present

## 2015-06-08 DIAGNOSIS — I5032 Chronic diastolic (congestive) heart failure: Secondary | ICD-10-CM | POA: Diagnosis not present

## 2015-06-08 DIAGNOSIS — K219 Gastro-esophageal reflux disease without esophagitis: Secondary | ICD-10-CM

## 2015-06-08 DIAGNOSIS — I25811 Atherosclerosis of native coronary artery of transplanted heart without angina pectoris: Secondary | ICD-10-CM | POA: Diagnosis not present

## 2015-06-08 DIAGNOSIS — M4806 Spinal stenosis, lumbar region: Secondary | ICD-10-CM

## 2015-06-08 DIAGNOSIS — E785 Hyperlipidemia, unspecified: Secondary | ICD-10-CM

## 2015-06-08 DIAGNOSIS — I10 Essential (primary) hypertension: Secondary | ICD-10-CM | POA: Diagnosis not present

## 2015-06-08 DIAGNOSIS — I25769 Atherosclerosis of bypass graft of coronary artery of transplanted heart with unspecified angina pectoris: Secondary | ICD-10-CM

## 2015-06-08 DIAGNOSIS — E871 Hypo-osmolality and hyponatremia: Secondary | ICD-10-CM

## 2015-06-08 DIAGNOSIS — M48061 Spinal stenosis, lumbar region without neurogenic claudication: Secondary | ICD-10-CM

## 2015-06-08 NOTE — Progress Notes (Signed)
MRN: [id number] Name: Becky Morales  Sex: female Age: 81 y.o. DOB: [birth year]  [place] #: Helene Kelp Facility/Room:112 Level Of Care: SNF Provider: Inocencio Homes D Emergency Contacts: Extended Emergency Contact Information Primary Emergency Contact: Jari Pigg of [place] Phone: [phone] Relation: Friend Secondary Emergency Contact: [place] of [place] Phone: [phone] Relation: Friend  Code Status:   Allergies: Ciprofloxacin and Paxil  Chief Complaint  Patient presents with  . New Admit To SNF    HPI: Patient is 81 y.o. female with PMH of hypertension, hyperlipidemia, GERD, CAD (S/B stent placement to 2012), carcinoid tumor (not know the detail), diastolic congestive heart failure, who presented to ED with increased urinary frequency and altered mental status who was admitted from 8/4-12 for urosepsis. Pt is admitted to SNF for generalized weakness for OT/PT. While at SNF pt will be followed for chronic hyponatremia for which a new med was added, demeclocycline, for HTN, chronic but requiring change oin meds and CAD tx with ASA and BBlocker.  Past Medical History  Diagnosis Date  . CARCINOID TUMOR 04/13/2007  . HYPERTENSION 04/13/2007  . ALLERGIC RHINITIS 04/13/2007  . GERD 04/13/2007  . HYPERGLYCEMIA 04/13/2007  . CHOLELITHIASIS 05/02/2010  . Arthritis   . Cancer   . Environmental allergies     has mucus in throat  . Diphtheria     age 65  . CAD (coronary artery disease)     NSTEMI.  LHC 09/03/11: pLAD 40-50%, pDx 80-90% (very small), lateral branch of OM 99%, m-dCFX 40%, oRCA 80%, m-dRCA 50-70%, EF 55%.  She had PCI with BMS to lateral branch of the OM.  RCA was tx medically.   . Hyponatremia   . Murmur     echo 11/12: EF 55-60%, mod calcified AV annulus, mild AI, mild MR  . Dermatophytosis of nail   . UTI (urinary tract infection) 03/2015    Past Surgical History  Procedure Laterality Date  . Tonsillectomy       many years ago  . Cardiac surgery    . Colon surgery      CANCER  . Small bowel repair    . Left heart catheterization with coronary angiogram N/A 09/03/2011    Procedure: LEFT HEART CATHETERIZATION WITH CORONARY ANGIOGRAM;  Surgeon: Peter M Martinique, MD;  Location: [facility] CATH LAB;  Service: Cardiovascular;  Laterality: N/A;  . Percutaneous coronary stent intervention (pci-s)  09/03/2011    Procedure: PERCUTANEOUS CORONARY STENT INTERVENTION (PCI-S);  Surgeon: Peter M Martinique, MD;  Location: [facility] CATH LAB;  Service: Cardiovascular;;      Medication List       This list is accurate as of: 06/08/15 11:59 PM.  Always use your most recent med list.               acetaminophen 325 MG tablet  Commonly known as:  TYLENOL  Take 2 tablets (650 mg total) by mouth every 6 (six) hours as needed for mild pain (or Fever >/= 101).     aspirin 81 MG tablet  Take 81 mg by mouth daily.     atorvastatin 80 MG tablet  Commonly known as:  LIPITOR  Take 80 mg by mouth once a week. Take on Mondays     calcium-vitamin D 500-200 MG-UNIT per tablet  Commonly known as:  OSCAL WITH D  Take 1 tablet by mouth 2 (two) times daily.     demeclocycline 150 MG tablet  Commonly known as:  DECLOMYCIN  Take  2 tablets (300 mg total) by mouth 2 (two) times daily. For 5 days     feeding supplement (ENSURE ENLIVE) Liqd  Take 237 mLs by mouth 2 (two) times daily between meals.     metoprolol 50 MG tablet  Commonly known as:  LOPRESSOR  Take 1 tablet (50 mg total) by mouth 2 (two) times daily.     MULTI VITAMIN DAILY Tabs  Take 1 tablet by mouth daily. Pt. Not sure of dose.     nitroGLYCERIN 0.4 MG SL tablet  Commonly known as:  NITROSTAT  Place 1 tablet (0.4 mg total) under the tongue every 5 (five) minutes as needed for chest pain.     pantoprazole 40 MG tablet  Commonly known as:  PROTONIX  Take 1 tablet (40 mg total) by mouth daily.     valsartan 320 MG tablet  Commonly known as:  DIOVAN  Take 1 tablet  (320 mg total) by mouth daily.        No orders of the defined types were placed in this encounter.    Immunization History  Administered Date(s) Administered  . Influenza Split 07/15/2011, 07/02/2012  . Influenza Whole 08/19/2007, 07/29/2008  . Influenza,inj,Quad PF,36+ Mos 10/12/2013  . Pneumococcal Conjugate-13 12/27/2014  . Pneumococcal Polysaccharide-23 08/22/2003  . Tdap 05/30/2011    Social History  Substance Use Topics  . Smoking status: Never Smoker   . Smokeless tobacco: Never Used  . Alcohol Use: No    Family history is  + HTN, stroke,CA  Review of Systems  DATA OBTAINED: from patient, nurse; only c/o pt would like regular milk in diet GENERAL:  no fevers, fatigue, appetite changes SKIN: No itching, rash or wounds EYES: No eye pain, redness, discharge EARS: No earache, tinnitus, change in hearing NOSE: No congestion, drainage or bleeding  MOUTH/THROAT: No mouth or tooth pain, No sore throat RESPIRATORY: No cough, wheezing, SOB CARDIAC: No chest pain, palpitations, lower extremity edema  GI: No abdominal pain, No N/V/D or constipation, No heartburn or reflux  GU: No dysuria, frequency or urgency, or incontinence  MUSCULOSKELETAL: No unrelieved bone/joint pain NEUROLOGIC: No headache, dizziness or focal weakness PSYCHIATRIC: No c/o anxiety or sadness   Filed Vitals:   06/08/15 2132  BP: 120/68  Pulse: 68  Temp: 98 F (36.7 C)  Resp: 18    SpO2 Readings from Last 1 Encounters:  06/03/15 96%        Physical Exam  GENERAL APPEARANCE: Alert, conversant, BF with blue-green eyes, No acute distress.  SKIN: No diaphoresis rash HEAD: Normocephalic, atraumatic  EYES: Conjunctiva/lids clear. Pupils round, reactive. EOMs intact.  EARS: External exam WNL, canals clear. Hearing grossly normal.  NOSE: No deformity or discharge.  MOUTH/THROAT: Lips w/o lesions  RESPIRATORY: Breathing is even, unlabored. Lung sounds are clear   CARDIOVASCULAR: Heart RRR  no murmurs, rubs or gallops. No peripheral edema.   GASTROINTESTINAL: Abdomen is soft, non-tender, not distended w/ normal bowel sounds. GENITOURINARY: Bladder non tender, not distended  MUSCULOSKELETAL: No abnormal joints or musculature NEUROLOGIC:  Cranial nerves 2-12 grossly intact. Moves all extremities  PSYCHIATRIC: Mood and affect appropriate to situation with some dementia but not much at age 63, no behavioral issues  Patient Active Problem List   Diagnosis Date Noted  . Aspiration into airway   . Sepsis secondary to UTI   . Lactic acidosis   . Notalgia   . CAD in native artery   . HLD (hyperlipidemia)   . Chronic diastolic CHF (congestive  heart failure)   . UTI (lower urinary tract infection) 05/25/2015  . Acute encephalopathy 05/25/2015  . Urinary tract infection 05/25/2015  . Back pain 05/25/2015  . Urinary tract infectious disease   . Chronic diastolic heart failure 74/25/9563  . Stable angina 04/06/2015  . Hyponatremia 04/06/2015  . Goals of care, counseling/discussion 02/24/2015  . Anxiety state 08/24/2014  . Palpitations 08/04/2014  . DNR/DNI 07/21/14 07/21/2014  . Dermatophytosis of nail 07/12/2013  . Hyperlipidemia 10/31/2011  . CAD (coronary artery disease)  s/p  PCI for NSTEMI. with angina.  09/19/2011  . Spinal stenosis of lumbar region at multiple levels 08/29/2011  . CHOLELITHIASIS 05/02/2010  . Malignant carcinoid tumor of unknown primary site 04/13/2007  . Essential hypertension 04/13/2007  . Allergic rhinitis 04/13/2007  . GERD 04/13/2007  . HYPERGLYCEMIA 04/13/2007    CBC    Component Value Date/Time   WBC 7.6 06/01/2015 0511   WBC 4.4 07/10/2010 1007   RBC 5.13* 06/01/2015 0511   RBC 3.60* 07/10/2010 1007   HGB 16.4* 06/01/2015 0511   HGB 11.5* 07/10/2010 1007   HCT 46.7* 06/01/2015 0511   HCT 34.1* 07/10/2010 1007   PLT 397 06/01/2015 0511   PLT 414* 07/10/2010 1007   MCV 91.0 06/01/2015 0511   MCV 94.6 07/10/2010 1007   LYMPHSABS 0.7  06/01/2015 0511   LYMPHSABS 1.3 07/10/2010 1007   MONOABS 1.4* 06/01/2015 0511   MONOABS 0.5 07/10/2010 1007   EOSABS 0.2 06/01/2015 0511   EOSABS 0.2 07/10/2010 1007   BASOSABS 0.0 06/01/2015 0511   BASOSABS 0.0 07/10/2010 1007    CMP     Component Value Date/Time   NA 123* 06/03/2015 0317   K 4.5 06/03/2015 0317   CL 89* 06/03/2015 0317   CO2 27 06/03/2015 0317   GLUCOSE 103* 06/03/2015 0317   BUN 19 06/03/2015 0317   CREATININE 0.89 06/03/2015 0317   CALCIUM 8.6* 06/03/2015 0317   PROT 6.4* 06/01/2015 0511   ALBUMIN 2.4* 06/01/2015 0511   AST 23 06/01/2015 0511   ALT 18 06/01/2015 0511   ALKPHOS 89 06/01/2015 0511   BILITOT 0.9 06/01/2015 0511   GFRNONAA 54* 06/03/2015 0317   GFRAA >60 06/03/2015 0317    Lab Results  Component Value Date   HGBA1C 6.0 12/02/2008     Dg Chest 2 View  05/25/2015   CLINICAL DATA:  Back pain  EXAM: CHEST  2 VIEW  COMPARISON:  04/05/2015  FINDINGS: Mild cardiomegaly is stable. A coronary stent is noted. Stable mild aortic tortuosity and hila.  There is no edema, consolidation, effusion, or pneumothorax.  Spondylosis and mild thoracolumbar scoliosis. No explanation for acute back pain.  IMPRESSION: No active cardiopulmonary disease.   Electronically Signed   By: Monte Fantasia M.D.   On: 05/25/2015 18:21   Dg Lumbar Spine Complete  05/25/2015   CLINICAL DATA:  Acute onset of lower back pain, particularly at the left sacrum. Patient unresponsive. Initial encounter.  EXAM: LUMBAR SPINE - COMPLETE 4+ VIEW  COMPARISON:  CT of the abdomen and pelvis from 04/01/2014, and lumbar spine radiographs performed 08/29/2011  FINDINGS: There is no evidence of fracture or subluxation. Vertebral bodies demonstrate normal height and alignment. Scattered lateral osteophytes are seen along the lower thoracic and lumbar spine. Mild left convex thoracolumbar scoliosis is noted. Intervertebral disc spaces are grossly preserved.  The visualized bowel gas pattern is  unremarkable in appearance; air and stool are noted within the colon. The sacroiliac joints are within normal limits.  Scattered vascular calcifications are seen.  IMPRESSION: 1. No evidence of fracture or subluxation along the lumbar spine. 2. Mild left convex thoracolumbar scoliosis noted; mild underlying degenerative change seen. 3. Scattered vascular calcifications seen.   Electronically Signed   By: Garald Balding M.D.   On: 05/25/2015 18:22   Ct Head Wo Contrast  05/25/2015   CLINICAL DATA:  Acute onset of unresponsiveness.  Initial encounter.  EXAM: CT HEAD WITHOUT CONTRAST  TECHNIQUE: Contiguous axial images were obtained from the base of the skull through the vertex without intravenous contrast.  COMPARISON:  CT of the head performed 04/05/2015  FINDINGS: There is no evidence of acute infarction, mass lesion, or intra- or extra-axial hemorrhage on CT.  Prominence of the ventricles and sulci reflects moderate cortical volume loss. Diffuse periventricular and subcortical white matter change likely reflects small vessel ischemic microangiopathy. Cerebellar atrophy is noted.  The brainstem and fourth ventricle are within normal limits. The basal ganglia are unremarkable in appearance. The cerebral hemispheres demonstrate grossly normal gray-white differentiation. No mass effect or midline shift is seen.  There is no evidence of fracture; visualized osseous structures are unremarkable in appearance. The orbits are within normal limits. The paranasal sinuses and mastoid air cells are well-aerated. No significant soft tissue abnormalities are seen.  IMPRESSION: 1. No acute intracranial pathology seen on CT. 2. Moderate cortical volume loss and diffuse small vessel ischemic microangiopathy.   Electronically Signed   By: Garald Balding M.D.   On: 05/25/2015 18:31   Ct Renal Stone Study  05/25/2015   CLINICAL DATA:  Right-sided flank pain  EXAM: CT ABDOMEN AND PELVIS WITHOUT CONTRAST  TECHNIQUE: Multidetector CT  imaging of the abdomen and pelvis was performed following the standard protocol without IV contrast.  COMPARISON:  04/01/2014  FINDINGS: Lower chest: Minimal subpleural dependent atelectasis. Moderate cardiomegaly. Atheromatous aortic and coronary arterial calcifications are noted.  Hepatobiliary: Unenhanced CT was performed per clinician order. Lack of IV contrast limits sensitivity and specificity, especially for evaluation of abdominal/pelvic solid viscera. Unenhanced liver is grossly normal. Dependent stones are noted within the gallbladder.  Pancreas: Normal  Spleen: Normal  Adrenals/Urinary Tract: Adrenal glands are normal. No radiopaque renal, ureteral, or bladder calculus. No hydroureteronephrosis. Too small to characterize left mid renal cortical 8 mm hypodense lesion image 31.  Stomach/Bowel: Extensive diverticulosis without large bowel wall thickening. No bowel wall thickening or focal segmental dilatation is identified. Moderate stool burden. Appendix is normal.  Vascular/Lymphatic: Aortic ectasia noted with extensive vascular calcification but no aneurysm. Right lower quadrant 1 cm mesenteric node image 45 is nonspecific. No other lymphadenopathy.  Other: Uterus and ovaries are normal. Presumed sequela of previous trauma within the right buttock subcutaneous fat. No free air or fluid.  Musculoskeletal: Leftward curvature of the lumbar spine is noted. Bones are subjectively osteopenic with trabecular rarefaction. No compression deformity. Multilevel disc degenerative change.  IMPRESSION: No acute intra-abdominal or pelvic pathology.  Moderate stool burden.  Gallstones without other evidence for acute cholecystitis.   Electronically Signed   By: Conchita Paris M.D.   On: 05/25/2015 21:27   Dg Hip Unilat With Pelvis 2-3 Views Right  05/26/2015   CLINICAL DATA:  Right hip pain for several weeks.  EXAM: DG HIP (WITH OR WITHOUT PELVIS) 2-3V RIGHT  COMPARISON:  CT 04/01/2014  FINDINGS: Degenerative changes  lumbar spine and both hips. Diffuse osteopenia. No acute bony abnormality.  IMPRESSION: Diffuse osteopenia and degenerative change.  No acute abnormality.   Electronically Signed  By: [place]   On: 05/26/2015 08:46    Not all labs, radiology exams or other studies done during hospitalization come through on my EPIC note; however they are reviewed by me.    Assessment and Plan  UTI (lower urinary tract infection) positive AEROCOCCUS URINAE--treated - Ceftriaxone IV completed on 8/9 SNF plan - admitted for strengthening with OT/PT  Sepsis secondary to UTI With MS changes and lactic acidosis, resolved; SNF plan - OT/PT, supportive care  Hyponatremia Chronic. 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 Sodium has been stable in the 125-127 range. continue fluid restriction. Demeclocycline added. SNF plan - demeclocycline continue  then reassess continued need for same.; BMP to monitor, pt already with some pedal edema but pt will keep her legs up, cant get TED hose on she says  Essential hypertension BP meds reported increased 2/2 poor control; SNF plan - monitor with new dosages of metoprolol 50 mg BID and valsartan 320 mg daily  CAD (coronary artery disease)  s/p  PCI for NSTEMI. with angina.  No chest pain.  SNF plan -  Aspirin, metoprolol, when necessary nitroglycerin SL  Chronic diastolic CHF (congestive heart failure) 2-D echo on 02/05/13 showed EF of 60-65% with grade 1 diastolic dysfunction. Patient is not on diuretics at home. CHF is compensated. No leg edema. -echocardiogram: Compared to the prior study, there has been no significantinterval change.SNF paln - cont ASA and increased metoprolol; Lasix only if absolutely necessary 2/2 low Na+   GERD SNF plan - cont protonix  Spinal stenosis of lumbar region at multiple levels - X-ray of lumbar has no acute bony abnormalities. Patient is known to have spinal stenosis of lumbar region at multiple levels, no alarming symptoms,  such as leg weakness or urinary incontinence.SNF plan- prn tylenol  Hyperlipidemia LDL reported 64 in 09/2014 SNF plan - cont lipitor 80 mg daily    Hennie Duos, MD

## 2015-06-11 ENCOUNTER — Encounter: Payer: Self-pay | Admitting: Internal Medicine

## 2015-06-11 NOTE — Assessment & Plan Note (Signed)
SNF plan - cont protonix

## 2015-06-11 NOTE — Assessment & Plan Note (Signed)
LDL reported 64 in 09/2014 SNF plan - cont lipitor 80 mg daily

## 2015-06-11 NOTE — Assessment & Plan Note (Signed)
2-D echo on 02/05/13 showed EF of 60-65% with grade 1 diastolic dysfunction. Patient is not on diuretics at home. CHF is compensated. No leg edema. -echocardiogram: Compared to the prior study, there has been no significantinterval change.SNF paln - cont ASA and increased metoprolol; Lasix only if absolutely necessary 2/2 low Na+

## 2015-06-11 NOTE — Assessment & Plan Note (Signed)
With MS changes and lactic acidosis, resolved; SNF plan - OT/PT, supportive care

## 2015-06-11 NOTE — Assessment & Plan Note (Signed)
Chronic. 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 Sodium has been stable in the 125-127 range. continue fluid restriction. Demeclocycline added. SNF plan - demeclocycline continue  then reassess continued need for same.; BMP to monitor, pt already with some pedal edema but pt will keep her legs up, cant get TED hose on she says

## 2015-06-11 NOTE — Assessment & Plan Note (Signed)
-   X-ray of lumbar has no acute bony abnormalities. Patient is known to have spinal stenosis of lumbar region at multiple levels, no alarming symptoms, such as leg weakness or urinary incontinence.SNF plan- prn tylenol

## 2015-06-11 NOTE — Assessment & Plan Note (Signed)
No chest pain.  SNF plan -  Aspirin, metoprolol, when necessary nitroglycerin SL

## 2015-06-11 NOTE — Assessment & Plan Note (Signed)
positive AEROCOCCUS URINAE--treated - Ceftriaxone IV completed on 8/9 SNF plan - admitted for strengthening with OT/PT

## 2015-06-11 NOTE — Assessment & Plan Note (Signed)
BP meds reported increased 2/2 poor control; SNF plan - monitor with new dosages of metoprolol 50 mg BID and valsartan 320 mg daily

## 2015-06-21 ENCOUNTER — Non-Acute Institutional Stay (SKILLED_NURSING_FACILITY): Payer: Medicare Other | Admitting: Nurse Practitioner

## 2015-06-21 DIAGNOSIS — N390 Urinary tract infection, site not specified: Secondary | ICD-10-CM | POA: Diagnosis not present

## 2015-06-21 DIAGNOSIS — I25811 Atherosclerosis of native coronary artery of transplanted heart without angina pectoris: Secondary | ICD-10-CM | POA: Diagnosis not present

## 2015-06-21 DIAGNOSIS — I10 Essential (primary) hypertension: Secondary | ICD-10-CM | POA: Diagnosis not present

## 2015-06-21 DIAGNOSIS — I5032 Chronic diastolic (congestive) heart failure: Secondary | ICD-10-CM | POA: Diagnosis not present

## 2015-06-21 DIAGNOSIS — E785 Hyperlipidemia, unspecified: Secondary | ICD-10-CM

## 2015-06-21 DIAGNOSIS — I25769 Atherosclerosis of bypass graft of coronary artery of transplanted heart with unspecified angina pectoris: Secondary | ICD-10-CM

## 2015-06-21 DIAGNOSIS — E871 Hypo-osmolality and hyponatremia: Secondary | ICD-10-CM | POA: Diagnosis not present

## 2015-06-21 DIAGNOSIS — M48061 Spinal stenosis, lumbar region without neurogenic claudication: Secondary | ICD-10-CM

## 2015-06-21 DIAGNOSIS — K219 Gastro-esophageal reflux disease without esophagitis: Secondary | ICD-10-CM

## 2015-06-21 DIAGNOSIS — M4806 Spinal stenosis, lumbar region: Secondary | ICD-10-CM | POA: Diagnosis not present

## 2015-06-21 NOTE — Progress Notes (Signed)
Patient ID: LINZY LAURY, female   DOB: July 15, 1920, 81 y.o.   MRN: [id number]    Nursing Home Location:  [place] of Service: SNF (31)  PCP: Garret Reddish, MD  Allergies  Allergen Reactions  . Ciprofloxacin     emesis  . Paxil [Paroxetine Hcl] Nausea And Vomiting    Chief Complaint  Patient presents with  . Discharge Note    HPI:  Patient is a 81 y.o. female seen today at [facility] for discharge home. Pt with a pmh of hypertension, hyperlipidemia, GERD, CAD (S/B stent placement to 2012), carcinoid tumor, diastolic congestive heart failure, who was admitted from 8/4-12 for urosepsis. Pt is admitted to SNF for generalized weakness for OT/PT. Pt has been followed for chronic hyponatremia. Patient currently doing well with therapy, now stable to discharge home with niece and with home health.  Review of Systems:  Review of Systems  Constitutional: Negative for activity change, appetite change, fatigue and unexpected weight change.  HENT: Negative for congestion and hearing loss.   Eyes: Negative.   Respiratory: Negative for cough and shortness of breath.   Cardiovascular: Negative for chest pain, palpitations and leg swelling.  Gastrointestinal: Negative for abdominal pain, diarrhea and constipation.  Genitourinary: Negative for dysuria and difficulty urinating.  Musculoskeletal: Positive for myalgias (pain in lower back, controlled on current medication). Negative for arthralgias.  Skin: Negative for color change and wound.  Neurological: Negative for dizziness and weakness.  Psychiatric/Behavioral: Negative for behavioral problems, confusion and agitation.    Past Medical History  Diagnosis Date  . CARCINOID TUMOR 04/13/2007  . HYPERTENSION 04/13/2007  . ALLERGIC RHINITIS 04/13/2007  . GERD 04/13/2007  . HYPERGLYCEMIA 04/13/2007  . CHOLELITHIASIS 05/02/2010  . Arthritis   . Cancer   . Environmental allergies     has mucus in throat    . Diphtheria     age 75  . CAD (coronary artery disease)     NSTEMI.  LHC 09/03/11: pLAD 40-50%, pDx 80-90% (very small), lateral branch of OM 99%, m-dCFX 40%, oRCA 80%, m-dRCA 50-70%, EF 55%.  She had PCI with BMS to lateral branch of the OM.  RCA was tx medically.   . Hyponatremia   . Murmur     echo 11/12: EF 55-60%, mod calcified AV annulus, mild AI, mild MR  . Dermatophytosis of nail   . UTI (urinary tract infection) 03/2015   Past Surgical History  Procedure Laterality Date  . Tonsillectomy      many years ago  . Cardiac surgery    . Colon surgery      CANCER  . Small bowel repair    . Left heart catheterization with coronary angiogram N/A 09/03/2011    Procedure: LEFT HEART CATHETERIZATION WITH CORONARY ANGIOGRAM;  Surgeon: Peter M Martinique, MD;  Location: [facility] CATH LAB;  Service: Cardiovascular;  Laterality: N/A;  . Percutaneous coronary stent intervention (pci-s)  09/03/2011    Procedure: PERCUTANEOUS CORONARY STENT INTERVENTION (PCI-S);  Surgeon: Peter M Martinique, MD;  Location: [facility] CATH LAB;  Service: Cardiovascular;;   Social History:   reports that she has never smoked. She has never used smokeless tobacco. She reports that she does not drink alcohol or use illicit drugs.  Family History  Problem Relation Age of Onset  . Hypertension Mother   . Uterine cancer Mother   . Ovarian cancer Mother   . Hypertension Father   . Stroke Father   . Hypertension  Brother   . Hyperlipidemia Brother   . Heart attack Brother   . Lung cancer Sister     Medications: Patient's Medications  New Prescriptions   No medications on file  Previous Medications   ACETAMINOPHEN (TYLENOL) 325 MG TABLET    Take 2 tablets (650 mg total) by mouth every 6 (six) hours as needed for mild pain (or Fever >/= 101).   ASPIRIN 81 MG TABLET    Take 81 mg by mouth daily.    ATORVASTATIN (LIPITOR) 80 MG TABLET    Take 80 mg by mouth once a week. Take on Mondays   CALCIUM-VITAMIN D (OSCAL WITH D) 500-200  MG-UNIT PER TABLET    Take 1 tablet by mouth 2 (two) times daily.   FEEDING SUPPLEMENT, ENSURE ENLIVE, (ENSURE ENLIVE) LIQD    Take 237 mLs by mouth 2 (two) times daily between meals.   METOPROLOL TARTRATE (LOPRESSOR) 50 MG TABLET    Take 1 tablet (50 mg total) by mouth 2 (two) times daily.   MULTIPLE VITAMIN (MULTI VITAMIN DAILY) TABS    Take 1 tablet by mouth daily. Pt. Not sure of dose.   NITROGLYCERIN (NITROSTAT) 0.4 MG SL TABLET    Place 1 tablet (0.4 mg total) under the tongue every 5 (five) minutes as needed for chest pain.   PANTOPRAZOLE (PROTONIX) 40 MG TABLET    Take 1 tablet (40 mg total) by mouth daily.   VALSARTAN (DIOVAN) 320 MG TABLET    Take 1 tablet (320 mg total) by mouth daily.  Modified Medications   No medications on file  Discontinued Medications   DEMECLOCYCLINE (DECLOMYCIN) 150 MG TABLET    Take 2 tablets (300 mg total) by mouth 2 (two) times daily. For 5 days     Physical Exam: Filed Vitals:   06/21/15 1509  BP: 123/70  Pulse: 70  Temp: 97 F (36.1 C)  Resp: 20    Physical Exam  Constitutional: She appears well-developed and well-nourished. No distress.  HENT:  Head: Normocephalic and atraumatic.  Mouth/Throat: Oropharynx is clear and moist. No oropharyngeal exudate.  Eyes: Conjunctivae are normal. Pupils are equal, round, and reactive to light.  Neck: Normal range of motion. Neck supple.  Cardiovascular: Normal rate, regular rhythm and normal heart sounds.   Pulmonary/Chest: Effort normal and breath sounds normal.  Abdominal: Soft. Bowel sounds are normal.  Musculoskeletal: She exhibits edema (trace edema, wearing TEDs). She exhibits no tenderness.  Neurological: She is alert.  Skin: Skin is warm and dry. She is not diaphoretic.  Psychiatric: She has a normal mood and affect.    Labs reviewed: Basic Metabolic Panel:  Recent Labs  05/30/15 0431  05/31/15 0342  06/01/15 0511 06/02/15 0314 06/03/15 0317  NA 126*  < > 125*  < > 127* 125* 123*    K 4.6  < > 4.6  < > 4.8 4.5 4.5  CL 92*  < > 91*  < > 89* 90* 89*  CO2 25  < > 25  < > 27 26 27   GLUCOSE 113*  < > 101*  < > 92 93 103*  BUN 13  < > 19  < > 22* 21* 19  CREATININE 0.96  < > 1.01*  < > 0.99 0.94 0.89  CALCIUM 8.8*  < > 8.6*  < > 9.0 8.7* 8.6*  MG 1.7  --  1.8  --  1.8  --   --   < > = values in this interval not  displayed. Liver Function Tests:  Recent Labs  05/30/15 0431 05/31/15 0342 06/01/15 0511  AST 22 21 23   ALT 14 15 18   ALKPHOS 104 93 89  BILITOT 1.0 0.8 0.9  PROT 6.1* 6.4* 6.4*  ALBUMIN 2.6* 2.4* 2.4*    Recent Labs  05/25/15 1743  LIPASE 35   No results for input(s): AMMONIA in the last 8760 hours. CBC:  Recent Labs  05/30/15 0431 05/31/15 0342 06/01/15 0511  WBC 8.9 10.3 7.6  NEUTROABS 6.9 7.4 5.3  HGB 17.3* 16.4* 16.4*  HCT 49.0* 46.7* 46.7*  MCV 90.7 90.0 91.0  PLT 375 403* 397   TSH:  Recent Labs  08/11/14 1030 02/17/15 1229 05/30/15 0937  TSH 6.03* 5.33* 3.697   A1C: Lab Results  Component Value Date   HGBA1C 6.0 12/02/2008   Lipid Panel:  Recent Labs  10/05/14 0836  CHOL 138  HDL 59.40  LDLCALC 64  TRIG 72.0  CHOLHDL 2    Radiological Exams: Dg Chest 2 View  05/25/2015   CLINICAL DATA:  Back pain  EXAM: CHEST  2 VIEW  COMPARISON:  04/05/2015  FINDINGS: Mild cardiomegaly is stable. A coronary stent is noted. Stable mild aortic tortuosity and hila.  There is no edema, consolidation, effusion, or pneumothorax.  Spondylosis and mild thoracolumbar scoliosis. No explanation for acute back pain.  IMPRESSION: No active cardiopulmonary disease.   Electronically Signed   By: Monte Fantasia M.D.   On: 05/25/2015 18:21   Dg Lumbar Spine Complete  05/25/2015   CLINICAL DATA:  Acute onset of lower back pain, particularly at the left sacrum. Patient unresponsive. Initial encounter.  EXAM: LUMBAR SPINE - COMPLETE 4+ VIEW  COMPARISON:  CT of the abdomen and pelvis from 04/01/2014, and lumbar spine radiographs performed  08/29/2011  FINDINGS: There is no evidence of fracture or subluxation. Vertebral bodies demonstrate normal height and alignment. Scattered lateral osteophytes are seen along the lower thoracic and lumbar spine. Mild left convex thoracolumbar scoliosis is noted. Intervertebral disc spaces are grossly preserved.  The visualized bowel gas pattern is unremarkable in appearance; air and stool are noted within the colon. The sacroiliac joints are within normal limits. Scattered vascular calcifications are seen.  IMPRESSION: 1. No evidence of fracture or subluxation along the lumbar spine. 2. Mild left convex thoracolumbar scoliosis noted; mild underlying degenerative change seen. 3. Scattered vascular calcifications seen.   Electronically Signed   By: Garald Balding M.D.   On: 05/25/2015 18:22   Ct Head Wo Contrast  05/25/2015   CLINICAL DATA:  Acute onset of unresponsiveness.  Initial encounter.  EXAM: CT HEAD WITHOUT CONTRAST  TECHNIQUE: Contiguous axial images were obtained from the base of the skull through the vertex without intravenous contrast.  COMPARISON:  CT of the head performed 04/05/2015  FINDINGS: There is no evidence of acute infarction, mass lesion, or intra- or extra-axial hemorrhage on CT.  Prominence of the ventricles and sulci reflects moderate cortical volume loss. Diffuse periventricular and subcortical white matter change likely reflects small vessel ischemic microangiopathy. Cerebellar atrophy is noted.  The brainstem and fourth ventricle are within normal limits. The basal ganglia are unremarkable in appearance. The cerebral hemispheres demonstrate grossly normal gray-white differentiation. No mass effect or midline shift is seen.  There is no evidence of fracture; visualized osseous structures are unremarkable in appearance. The orbits are within normal limits. The paranasal sinuses and mastoid air cells are well-aerated. No significant soft tissue abnormalities are seen.  IMPRESSION: 1. No  acute intracranial pathology seen on CT. 2. Moderate cortical volume loss and diffuse small vessel ischemic microangiopathy.   Electronically Signed   By: Garald Balding M.D.   On: 05/25/2015 18:31   Ct Renal Stone Study  05/25/2015   CLINICAL DATA:  Right-sided flank pain  EXAM: CT ABDOMEN AND PELVIS WITHOUT CONTRAST  TECHNIQUE: Multidetector CT imaging of the abdomen and pelvis was performed following the standard protocol without IV contrast.  COMPARISON:  04/01/2014  FINDINGS: Lower chest: Minimal subpleural dependent atelectasis. Moderate cardiomegaly. Atheromatous aortic and coronary arterial calcifications are noted.  Hepatobiliary: Unenhanced CT was performed per clinician order. Lack of IV contrast limits sensitivity and specificity, especially for evaluation of abdominal/pelvic solid viscera. Unenhanced liver is grossly normal. Dependent stones are noted within the gallbladder.  Pancreas: Normal  Spleen: Normal  Adrenals/Urinary Tract: Adrenal glands are normal. No radiopaque renal, ureteral, or bladder calculus. No hydroureteronephrosis. Too small to characterize left mid renal cortical 8 mm hypodense lesion image 31.  Stomach/Bowel: Extensive diverticulosis without large bowel wall thickening. No bowel wall thickening or focal segmental dilatation is identified. Moderate stool burden. Appendix is normal.  Vascular/Lymphatic: Aortic ectasia noted with extensive vascular calcification but no aneurysm. Right lower quadrant 1 cm mesenteric node image 45 is nonspecific. No other lymphadenopathy.  Other: Uterus and ovaries are normal. Presumed sequela of previous trauma within the right buttock subcutaneous fat. No free air or fluid.  Musculoskeletal: Leftward curvature of the lumbar spine is noted. Bones are subjectively osteopenic with trabecular rarefaction. No compression deformity. Multilevel disc degenerative change.  IMPRESSION: No acute intra-abdominal or pelvic pathology.  Moderate stool burden.   Gallstones without other evidence for acute cholecystitis.   Electronically Signed   By: Conchita Paris M.D.   On: 05/25/2015 21:27   Dg Hip Unilat With Pelvis 2-3 Views Right  05/26/2015   CLINICAL DATA:  Right hip pain for several weeks.  EXAM: DG HIP (WITH OR WITHOUT PELVIS) 2-3V RIGHT  COMPARISON:  CT 04/01/2014  FINDINGS: Degenerative changes lumbar spine and both hips. Diffuse osteopenia. No acute bony abnormality.  IMPRESSION: Diffuse osteopenia and degenerative change.  No acute abnormality.   Electronically Signed   By: Marcello Moores  Register   On: 05/26/2015 08:46    Assessment/Plan  1. UTI (lower urinary tract infection) Completed ceftriaxone in hospital, no ongoing symptoms.   2. Hyponatremia -Chronic. Workup in hospital consistent with SIADH. Sodium has been stable in the 125-127 range. Was on demeclocycline for several days after hospitalization but has been stopped since 06/08/15,  Na 127 on recheck on 06/19/15. continue fluid restriction and will need ongoing outpatient follow up.   3. Essential hypertension Stable, conts on metoprolol and valsartan   4. CAD (coronary artery disease)  s/p  PCI for NSTEMI. with angina.  Stable, conts on ASA daily   5. Chronic diastolic CHF (congestive heart failure) Stable, not on diuretics due to low sodium   6. Gastroesophageal reflux disease without esophagitis Stable on protonix   7. Spinal stenosis of lumbar region at multiple levels Remains stable, uses tylenol as needed for pain.  8. Hyperlipidemia conts on lipitor 80 mg daily  pt is stable for discharge-will need PT/OT/ per home health. DME needed includes walker. Rx written.  will need to follow up with PCP within 2 weeks.     Carlos American. Harle Battiest  [facility] [phone] 8 am - 5 pm) [phone] (after hours)

## 2015-07-04 ENCOUNTER — Ambulatory Visit: Payer: Medicare Other | Admitting: Family Medicine

## 2015-07-06 ENCOUNTER — Ambulatory Visit: Payer: Medicare Other | Admitting: Podiatry

## 2015-07-11 ENCOUNTER — Emergency Department (HOSPITAL_COMMUNITY): Payer: Medicare Other

## 2015-07-11 ENCOUNTER — Encounter (HOSPITAL_COMMUNITY): Payer: Self-pay | Admitting: *Deleted

## 2015-07-11 ENCOUNTER — Inpatient Hospital Stay (HOSPITAL_COMMUNITY)
Admission: EM | Admit: 2015-07-11 | Discharge: 2015-07-15 | DRG: 555 | Disposition: A | Discharge status: Skilled Nursing Facility | Payer: Medicare Other | Attending: Internal Medicine | Admitting: Internal Medicine

## 2015-07-11 DIAGNOSIS — M25562 Pain in left knee: Secondary | ICD-10-CM | POA: Diagnosis present

## 2015-07-11 DIAGNOSIS — M25561 Pain in right knee: Secondary | ICD-10-CM | POA: Diagnosis present

## 2015-07-11 DIAGNOSIS — R739 Hyperglycemia, unspecified: Secondary | ICD-10-CM

## 2015-07-11 DIAGNOSIS — M1611 Unilateral primary osteoarthritis, right hip: Secondary | ICD-10-CM | POA: Diagnosis present

## 2015-07-11 DIAGNOSIS — R55 Syncope and collapse: Secondary | ICD-10-CM

## 2015-07-11 DIAGNOSIS — I5032 Chronic diastolic (congestive) heart failure: Secondary | ICD-10-CM | POA: Diagnosis present

## 2015-07-11 DIAGNOSIS — S72001A Fracture of unspecified part of neck of right femur, initial encounter for closed fracture: Secondary | ICD-10-CM | POA: Diagnosis not present

## 2015-07-11 DIAGNOSIS — E871 Hypo-osmolality and hyponatremia: Secondary | ICD-10-CM | POA: Diagnosis present

## 2015-07-11 DIAGNOSIS — W06XXXA Fall from bed, initial encounter: Secondary | ICD-10-CM | POA: Diagnosis present

## 2015-07-11 DIAGNOSIS — Z888 Allergy status to other drugs, medicaments and biological substances status: Secondary | ICD-10-CM | POA: Diagnosis not present

## 2015-07-11 DIAGNOSIS — Z881 Allergy status to other antibiotic agents status: Secondary | ICD-10-CM | POA: Diagnosis not present

## 2015-07-11 DIAGNOSIS — Z01810 Encounter for preprocedural cardiovascular examination: Secondary | ICD-10-CM | POA: Diagnosis not present

## 2015-07-11 DIAGNOSIS — Z7982 Long term (current) use of aspirin: Secondary | ICD-10-CM

## 2015-07-11 DIAGNOSIS — K219 Gastro-esophageal reflux disease without esophagitis: Secondary | ICD-10-CM | POA: Diagnosis present

## 2015-07-11 DIAGNOSIS — N179 Acute kidney failure, unspecified: Secondary | ICD-10-CM | POA: Diagnosis present

## 2015-07-11 DIAGNOSIS — E43 Unspecified severe protein-calorie malnutrition: Secondary | ICD-10-CM | POA: Insufficient documentation

## 2015-07-11 DIAGNOSIS — Z955 Presence of coronary angioplasty implant and graft: Secondary | ICD-10-CM | POA: Diagnosis not present

## 2015-07-11 DIAGNOSIS — Z515 Encounter for palliative care: Secondary | ICD-10-CM | POA: Diagnosis not present

## 2015-07-11 DIAGNOSIS — Y92009 Unspecified place in unspecified non-institutional (private) residence as the place of occurrence of the external cause: Secondary | ICD-10-CM

## 2015-07-11 DIAGNOSIS — I2510 Atherosclerotic heart disease of native coronary artery without angina pectoris: Secondary | ICD-10-CM | POA: Diagnosis present

## 2015-07-11 DIAGNOSIS — I252 Old myocardial infarction: Secondary | ICD-10-CM

## 2015-07-11 DIAGNOSIS — M25551 Pain in right hip: Principal | ICD-10-CM

## 2015-07-11 DIAGNOSIS — S72001S Fracture of unspecified part of neck of right femur, sequela: Secondary | ICD-10-CM | POA: Diagnosis not present

## 2015-07-11 DIAGNOSIS — Z79899 Other long term (current) drug therapy: Secondary | ICD-10-CM | POA: Diagnosis not present

## 2015-07-11 DIAGNOSIS — T148XXA Other injury of unspecified body region, initial encounter: Secondary | ICD-10-CM

## 2015-07-11 DIAGNOSIS — E785 Hyperlipidemia, unspecified: Secondary | ICD-10-CM | POA: Diagnosis present

## 2015-07-11 DIAGNOSIS — Z681 Body mass index (BMI) 19 or less, adult: Secondary | ICD-10-CM | POA: Diagnosis not present

## 2015-07-11 DIAGNOSIS — I10 Essential (primary) hypertension: Secondary | ICD-10-CM | POA: Diagnosis present

## 2015-07-11 LAB — CBC WITH DIFFERENTIAL/PLATELET
Basophils Absolute: 0 10*3/uL (ref 0.0–0.1)
Basophils Relative: 0 %
EOS PCT: 1 %
Eosinophils Absolute: 0.1 10*3/uL (ref 0.0–0.7)
HCT: 49.6 % — ABNORMAL HIGH (ref 36.0–46.0)
Hemoglobin: 17.5 g/dL — ABNORMAL HIGH (ref 12.0–15.0)
LYMPHS ABS: 0.6 10*3/uL — AB (ref 0.7–4.0)
Lymphocytes Relative: 11 %
MCH: 32.2 pg (ref 26.0–34.0)
MCHC: 35.3 g/dL (ref 30.0–36.0)
MCV: 91.2 fL (ref 78.0–100.0)
Monocytes Absolute: 0.5 10*3/uL (ref 0.1–1.0)
Monocytes Relative: 9 %
Neutro Abs: 4.2 10*3/uL (ref 1.7–7.7)
Neutrophils Relative %: 79 %
PLATELETS: 426 10*3/uL — AB (ref 150–400)
RBC: 5.44 MIL/uL — ABNORMAL HIGH (ref 3.87–5.11)
RDW: 17.5 % — ABNORMAL HIGH (ref 11.5–15.5)
WBC: 5.3 10*3/uL (ref 4.0–10.5)

## 2015-07-11 LAB — COMPREHENSIVE METABOLIC PANEL
ALK PHOS: 102 U/L (ref 38–126)
ALT: 21 U/L (ref 14–54)
AST: 29 U/L (ref 15–41)
Albumin: 3.4 g/dL — ABNORMAL LOW (ref 3.5–5.0)
Anion gap: 8 (ref 5–15)
BUN: 19 mg/dL (ref 6–20)
CALCIUM: 9.4 mg/dL (ref 8.9–10.3)
CHLORIDE: 97 mmol/L — AB (ref 101–111)
CO2: 25 mmol/L (ref 22–32)
CREATININE: 1.14 mg/dL — AB (ref 0.44–1.00)
GFR calc non Af Amer: 40 mL/min — ABNORMAL LOW (ref >60)
GFR, EST AFRICAN AMERICAN: 47 mL/min — AB (ref >60)
Glucose, Bld: 162 mg/dL — ABNORMAL HIGH (ref 65–99)
Potassium: 4.5 mmol/L (ref 3.5–5.1)
Sodium: 130 mmol/L — ABNORMAL LOW (ref 135–145)
TOTAL PROTEIN: 7.3 g/dL (ref 6.5–8.1)
Total Bilirubin: 0.7 mg/dL (ref 0.3–1.2)

## 2015-07-11 LAB — I-STAT TROPONIN, ED: Troponin i, poc: 0 ng/mL (ref 0.00–0.08)

## 2015-07-11 LAB — URINALYSIS, ROUTINE W REFLEX MICROSCOPIC
Bilirubin Urine: NEGATIVE
GLUCOSE, UA: NEGATIVE mg/dL
HGB URINE DIPSTICK: NEGATIVE
KETONES UR: NEGATIVE mg/dL
Leukocytes, UA: NEGATIVE
Nitrite: NEGATIVE
PH: 5.5 (ref 5.0–8.0)
PROTEIN: NEGATIVE mg/dL
Specific Gravity, Urine: 1.017 (ref 1.005–1.030)
Urobilinogen, UA: 0.2 mg/dL (ref 0.0–1.0)

## 2015-07-11 LAB — TYPE AND SCREEN
ABO/RH(D): B POS
Antibody Screen: NEGATIVE

## 2015-07-11 LAB — ABO/RH: ABO/RH(D): B POS

## 2015-07-11 MED ORDER — ENSURE ENLIVE PO LIQD
237.0000 mL | Freq: Two times a day (BID) | ORAL | Status: DC
  Administered 2015-07-12 – 2015-07-15 (×4): 237 mL via ORAL

## 2015-07-11 MED ORDER — SODIUM CHLORIDE 0.9 % IV SOLN
INTRAVENOUS | Status: DC
  Administered 2015-07-11: 22:00:00 via INTRAVENOUS

## 2015-07-11 MED ORDER — DOCUSATE SODIUM 100 MG PO CAPS
100.0000 mg | ORAL_CAPSULE | Freq: Two times a day (BID) | ORAL | Status: DC
  Administered 2015-07-11 – 2015-07-15 (×8): 100 mg via ORAL
  Filled 2015-07-11 (×8): qty 1

## 2015-07-11 MED ORDER — CALCIUM CARBONATE-VITAMIN D 500-200 MG-UNIT PO TABS
1.0000 | ORAL_TABLET | Freq: Two times a day (BID) | ORAL | Status: DC
  Administered 2015-07-12 – 2015-07-15 (×7): 1 via ORAL
  Filled 2015-07-11 (×8): qty 1

## 2015-07-11 MED ORDER — METHOCARBAMOL 500 MG PO TABS: 500.0000 mg | ORAL_TABLET | Freq: Four times a day (QID) | ORAL | Status: DC | PRN

## 2015-07-11 MED ORDER — HYDROCODONE-ACETAMINOPHEN 5-325 MG PO TABS
1.0000 | ORAL_TABLET | Freq: Four times a day (QID) | ORAL | Status: DC | PRN
  Administered 2015-07-12 – 2015-07-13 (×4): 2 via ORAL
  Filled 2015-07-11 (×4): qty 2

## 2015-07-11 MED ORDER — METOPROLOL TARTRATE 50 MG PO TABS
50.0000 mg | ORAL_TABLET | Freq: Two times a day (BID) | ORAL | Status: DC
  Administered 2015-07-11 – 2015-07-15 (×8): 50 mg via ORAL
  Filled 2015-07-11 (×8): qty 1

## 2015-07-11 MED ORDER — MORPHINE SULFATE (PF) 2 MG/ML IV SOLN
0.5000 mg | INTRAVENOUS | Status: DC | PRN
  Administered 2015-07-11 – 2015-07-15 (×6): 0.5 mg via INTRAVENOUS
  Filled 2015-07-11 (×6): qty 1

## 2015-07-11 MED ORDER — ADULT MULTIVITAMIN W/MINERALS CH
1.0000 | ORAL_TABLET | Freq: Every day | ORAL | Status: DC
Start: 2015-07-12 — End: 2015-07-15
  Administered 2015-07-12 – 2015-07-15 (×4): 1 via ORAL
  Filled 2015-07-11 (×5): qty 1

## 2015-07-11 MED ORDER — METHOCARBAMOL 1000 MG/10ML IJ SOLN
500.0000 mg | Freq: Four times a day (QID) | INTRAVENOUS | Status: DC | PRN
  Filled 2015-07-11: qty 5

## 2015-07-11 MED ORDER — LORAZEPAM 2 MG/ML IJ SOLN
0.5000 mg | Freq: Once | INTRAMUSCULAR | Status: AC
  Administered 2015-07-11: 0.5 mg via INTRAVENOUS

## 2015-07-11 MED ORDER — PANTOPRAZOLE SODIUM 40 MG PO TBEC
40.0000 mg | DELAYED_RELEASE_TABLET | Freq: Every day | ORAL | Status: DC
  Administered 2015-07-13 – 2015-07-15 (×3): 40 mg via ORAL
  Filled 2015-07-11 (×3): qty 1

## 2015-07-11 MED ORDER — SENNOSIDES-DOCUSATE SODIUM 8.6-50 MG PO TABS
1.0000 | ORAL_TABLET | Freq: Every evening | ORAL | Status: DC | PRN
  Administered 2015-07-13: 1 via ORAL
  Filled 2015-07-11: qty 1

## 2015-07-11 MED ORDER — LORAZEPAM 2 MG/ML IJ SOLN
INTRAMUSCULAR | Status: AC
  Administered 2015-07-11: 0.5 mg via INTRAVENOUS
  Filled 2015-07-11: qty 1

## 2015-07-11 MED ORDER — IRBESARTAN 300 MG PO TABS
300.0000 mg | ORAL_TABLET | Freq: Every day | ORAL | Status: DC
  Administered 2015-07-12: 300 mg via ORAL
  Filled 2015-07-11: qty 1

## 2015-07-11 MED ORDER — ATORVASTATIN CALCIUM 80 MG PO TABS: 80.0000 mg | ORAL_TABLET | ORAL | Status: DC

## 2015-07-11 MED ORDER — BISACODYL 10 MG RE SUPP: 10.0000 mg | Freq: Every day | RECTAL | Status: DC | PRN

## 2015-07-11 MED ORDER — NITROGLYCERIN 0.4 MG SL SUBL: 0.4000 mg | SUBLINGUAL_TABLET | SUBLINGUAL | Status: DC | PRN

## 2015-07-11 NOTE — ED Provider Notes (Signed)
CSN: [id number]     Arrival date & time 07/11/15  1632 History   First MD Initiated Contact with Patient 07/11/15 1637     Chief Complaint  Patient presents with  . Fall     (Consider location/radiation/quality/duration/timing/severity/associated sxs/prior Treatment) The history is provided by the patient.  Becky Morales is a 81 y.o. female hx of HTN, CAD, recurrent UTI here with fall. Patient states that she passed out twice. Her cousin who stays with her states that patient likely just all the bed and was seen on the floor. Her cousin did not observe her passing out or fall. Patient also has been falling very frequently. She fell a week ago and was noted to have a bruise on the right face. Patient is not currently on blood thinners.   Past Medical History  Diagnosis Date  . CARCINOID TUMOR 04/13/2007  . HYPERTENSION 04/13/2007  . ALLERGIC RHINITIS 04/13/2007  . GERD 04/13/2007  . HYPERGLYCEMIA 04/13/2007  . CHOLELITHIASIS 05/02/2010  . Arthritis   . Cancer   . Environmental allergies     has mucus in throat  . Diphtheria     age 72  . CAD (coronary artery disease)     NSTEMI.  LHC 09/03/11: pLAD 40-50%, pDx 80-90% (very small), lateral branch of OM 99%, m-dCFX 40%, oRCA 80%, m-dRCA 50-70%, EF 55%.  She had PCI with BMS to lateral branch of the OM.  RCA was tx medically.   . Hyponatremia   . Murmur     echo 11/12: EF 55-60%, mod calcified AV annulus, mild AI, mild MR  . Dermatophytosis of nail   . UTI (urinary tract infection) 03/2015   Past Surgical History  Procedure Laterality Date  . Tonsillectomy      many years ago  . Cardiac surgery    . Colon surgery      CANCER  . Small bowel repair    . Left heart catheterization with coronary angiogram N/A 09/03/2011    Procedure: LEFT HEART CATHETERIZATION WITH CORONARY ANGIOGRAM;  Surgeon: Peter M Martinique, MD;  Location: [facility] CATH LAB;  Service: Cardiovascular;  Laterality: N/A;  . Percutaneous coronary stent intervention (pci-s)   09/03/2011    Procedure: PERCUTANEOUS CORONARY STENT INTERVENTION (PCI-S);  Surgeon: Peter M Martinique, MD;  Location: [facility] CATH LAB;  Service: Cardiovascular;;   Family History  Problem Relation Age of Onset  . Hypertension Mother   . Uterine cancer Mother   . Ovarian cancer Mother   . Hypertension Father   . Stroke Father   . Hypertension Brother   . Hyperlipidemia Brother   . Heart attack Brother   . Lung cancer Sister    Social History  Substance Use Topics  . Smoking status: Never Smoker   . Smokeless tobacco: Never Used  . Alcohol Use: No   OB History    No data available     Review of Systems  Neurological: Positive for dizziness and syncope.  All other systems reviewed and are negative.     Allergies  Ciprofloxacin and Paxil  Home Medications   Prior to Admission medications   Medication Sig Start Date End Date Taking? Authorizing Provider  aspirin 81 MG tablet Take 81 mg by mouth daily.    Yes Historical Provider, MD  Aspirin-Acetaminophen (GOODYS BODY PAIN PO) Take 1 Package by mouth 2 (two) times daily as needed (pain).   Yes Historical Provider, MD  atorvastatin (LIPITOR) 80 MG tablet Take 80 mg by mouth once  a week. Take on Mondays   Yes Historical Provider, MD  calcium-vitamin D (OSCAL WITH D) 500-200 MG-UNIT per tablet Take 1 tablet by mouth 2 (two) times daily.   Yes Historical Provider, MD  feeding supplement, ENSURE ENLIVE, (ENSURE ENLIVE) LIQD Take 237 mLs by mouth 2 (two) times daily between meals. 04/07/15  Yes Annita Brod, MD  metoprolol tartrate (LOPRESSOR) 50 MG tablet Take 1 tablet (50 mg total) by mouth 2 (two) times daily. 06/02/15  Yes Delfina Redwood, MD  Multiple Vitamin (MULTI VITAMIN DAILY) TABS Take 1 tablet by mouth daily. Pt. Not sure of dose.   Yes Historical Provider, MD  nitroGLYCERIN (NITROSTAT) 0.4 MG SL tablet Place 1 tablet (0.4 mg total) under the tongue every 5 (five) minutes as needed for chest pain. 02/24/15  Yes Marin Olp, MD  pantoprazole (PROTONIX) 40 MG tablet Take 1 tablet (40 mg total) by mouth daily. 02/24/15  Yes Marin Olp, MD  valsartan (DIOVAN) 320 MG tablet Take 1 tablet (320 mg total) by mouth daily. 06/02/15  Yes Delfina Redwood, MD  acetaminophen (TYLENOL) 325 MG tablet Take 2 tablets (650 mg total) by mouth every 6 (six) hours as needed for mild pain (or Fever >/= 101). Patient not taking: Reported on 07/11/2015 06/02/15   Delfina Redwood, MD   BP 164/92 mmHg  Pulse 66  Temp(Src) 98.7 F (37.1 C) (Oral)  Resp 16  Wt 119 lb (53.978 kg)  SpO2 98% Physical Exam  Constitutional:  Chronically ill   HENT:  Head: Normocephalic and atraumatic.  Mouth/Throat: Oropharynx is clear and moist.  Bruise on R face, old, no bony tenderness   Eyes: Conjunctivae are normal. Pupils are equal, round, and reactive to light.  Neck: Normal range of motion. Neck supple.  Cardiovascular: Normal rate, regular rhythm and normal heart sounds.   Pulmonary/Chest: Effort normal and breath sounds normal. No respiratory distress. She has no wheezes. She has no rales.  Abdominal: Soft. Bowel sounds are normal. She exhibits no distension. There is no rebound.  Musculoskeletal:  Dec ROM R hip, unable to extend the hip but the leg is not shortened. 2+ pulses. No other obvious extremity trauma   Neurological: She is alert.  Demented, moving all extremities except L leg   Skin: Skin is warm and dry.  Psychiatric: She has a normal mood and affect. Her behavior is normal. Judgment and thought content normal.  Nursing note and vitals reviewed.   ED Course  Procedures (including critical care time) Labs Review Labs Reviewed  CBC WITH DIFFERENTIAL/PLATELET - Abnormal; Notable for the following:    RBC 5.44 (*)    Hemoglobin 17.5 (*)    HCT 49.6 (*)    RDW 17.5 (*)    Platelets 426 (*)    Lymphs Abs 0.6 (*)    All other components within normal limits  COMPREHENSIVE METABOLIC PANEL - Abnormal; Notable for  the following:    Sodium 130 (*)    Chloride 97 (*)    Glucose, Bld 162 (*)    Creatinine, Ser 1.14 (*)    Albumin 3.4 (*)    GFR calc non Af Amer 40 (*)    GFR calc Af Amer 47 (*)    All other components within normal limits  URINE CULTURE  URINALYSIS, ROUTINE W REFLEX MICROSCOPIC (NOT AT [facility])  Randolm Idol, ED    Imaging Review Dg Chest 2 View  07/11/2015   CLINICAL DATA:  Fall from  bed.  Right hip pain.  EXAM: CHEST  2 VIEW  COMPARISON:  05/29/2015  FINDINGS: The heart size and mediastinal contours are within normal limits. Both lungs are clear. The bones appear diffusely osteopenic. There is no acute fracture or subluxation identified.  IMPRESSION: No active cardiopulmonary disease.   Electronically Signed   By: Kerby Moors M.D.   On: 07/11/2015 17:52   Ct Head Wo Contrast  07/11/2015   CLINICAL DATA:  Pt is from home, fell out of bed, right hip pain today, left sided temporal abrasion. Denies LOC, no blood thinners. No obvious deformity. Pt usually uses a walker at home, is unsteady on her feet. Pt also had a fall on Saturday  EXAM: CT HEAD WITHOUT CONTRAST  CT CERVICAL SPINE WITHOUT CONTRAST  TECHNIQUE: Multidetector CT imaging of the head and cervical spine was performed following the standard protocol without intravenous contrast. Multiplanar CT image reconstructions of the cervical spine were also generated.  COMPARISON:  05/25/2015  FINDINGS: CT HEAD FINDINGS  Partial opacification of right mastoid air cells as before. Atherosclerotic and physiologic intracranial calcifications. Diffuse parenchymal atrophy. Prominence of lateral ventricles as before. Patchy areas of hypoattenuation in deep and periventricular white matter bilaterally. Negative for acute intracranial hemorrhage, mass lesion, acute infarction, midline shift, or mass-effect. Acute infarct may be inapparent on noncontrast CT. Ventricles and sulci symmetric. Bone windows demonstrate no focal lesion.  CT CERVICAL SPINE  FINDINGS  Normal alignment. No prevertebral soft tissue swelling. Facets are seated. Mild narrowing of the C2-3 and C5-6 interspaces. Fairly exuberant degenerative change at the atlantoaxial articulation. Anterior and posterior endplate spurring Z8-H8. Negative for fracture. Bilateral carotid calcified plaque. Scarring in the visualized lung apices. Calcification in the apical segment left upper lobe, incompletely visualized.  IMPRESSION: 1. Negative for bleed or other acute intracranial process. 2. Atrophy and nonspecific white matter changes as before. 3. Negative for cervical fracture or other acute bone abnormality. 4. Multilevel cervical degenerative changes as above.   Electronically Signed   By: Lucrezia Europe M.D.   On: 07/11/2015 18:01   Ct Cervical Spine Wo Contrast  07/11/2015   CLINICAL DATA:  Pt is from home, fell out of bed, right hip pain today, left sided temporal abrasion. Denies LOC, no blood thinners. No obvious deformity. Pt usually uses a walker at home, is unsteady on her feet. Pt also had a fall on Saturday  EXAM: CT HEAD WITHOUT CONTRAST  CT CERVICAL SPINE WITHOUT CONTRAST  TECHNIQUE: Multidetector CT imaging of the head and cervical spine was performed following the standard protocol without intravenous contrast. Multiplanar CT image reconstructions of the cervical spine were also generated.  COMPARISON:  05/25/2015  FINDINGS: CT HEAD FINDINGS  Partial opacification of right mastoid air cells as before. Atherosclerotic and physiologic intracranial calcifications. Diffuse parenchymal atrophy. Prominence of lateral ventricles as before. Patchy areas of hypoattenuation in deep and periventricular white matter bilaterally. Negative for acute intracranial hemorrhage, mass lesion, acute infarction, midline shift, or mass-effect. Acute infarct may be inapparent on noncontrast CT. Ventricles and sulci symmetric. Bone windows demonstrate no focal lesion.  CT CERVICAL SPINE FINDINGS  Normal alignment.  No prevertebral soft tissue swelling. Facets are seated. Mild narrowing of the C2-3 and C5-6 interspaces. Fairly exuberant degenerative change at the atlantoaxial articulation. Anterior and posterior endplate spurring I5-O2. Negative for fracture. Bilateral carotid calcified plaque. Scarring in the visualized lung apices. Calcification in the apical segment left upper lobe, incompletely visualized.  IMPRESSION: 1. Negative for bleed or other  acute intracranial process. 2. Atrophy and nonspecific white matter changes as before. 3. Negative for cervical fracture or other acute bone abnormality. 4. Multilevel cervical degenerative changes as above.   Electronically Signed   By: Lucrezia Europe M.D.   On: 07/11/2015 18:01   Dg Hip Unilat With Pelvis 2-3 Views Right  07/11/2015   CLINICAL DATA:  Right hip pain  EXAM: DG HIP (WITH OR WITHOUT PELVIS) 2-3V RIGHT  COMPARISON:  05/26/2015  FINDINGS: There is foreshortening of the right femoral neck which is suspicious for a subcapital femoral neck fracture. There is advanced osteoarthritis involving the right hip joint with near bone-on-bone contact. Marginal spur formation is noted. No dislocation.  IMPRESSION: 1. Findings are worrisome for impacted subcapital femoral neck fracture involving the right hip. Recommend confirmatory imaging with right hip CT or MRI. 2. Osteopenia. 3. Degenerative joint disease.   Electronically Signed   By: Kerby Moors M.D.   On: 07/11/2015 17:49   I have personally reviewed and evaluated these images and lab results as part of my medical decision-making.   EKG Interpretation   Date/Time:  Tuesday July 11 2015 17:00:07 EDT Ventricular Rate:  63 PR Interval:  166 QRS Duration: 81 QT Interval:  406 QTC Calculation: 416 R Axis:   55 Text Interpretation:  Sinus rhythm Right atrial enlargement Probable  anteroseptal infarct, recent No significant change since last tracing  Confirmed by YAO  MD, DAVID ([id number]) on 07/11/2015 5:09:35  PM      MDM   Final diagnoses:  None    Chesley XIA STOHR is a 81 y.o. female here with fall, possible syncope. Will get CT head/neck, labs, xrays. Will likely admit for syncope.   6:13 PM Xray showed impacted subcapital femoral neck fracture. Consulted Dr. Percell Miller, who request hospitalist admission and NPO after midnight and transfer to [facility]. Labs at baseline. CT head/neck unremarkable. Na 130, baseline.       Wandra Arthurs, MD 07/11/15 [phone]

## 2015-07-11 NOTE — Progress Notes (Signed)
[name] met with patient at bedside. Cousin and church member were present. Patient confirms that she presents to [facility] due to falling out of the bed. Patient states that she broke her hip.  Patient informed [name] that she has fallen x2 in 6 months including this incident. Patient states that she needs assistance with completing her ADL's. Patient states that she is not sure about going to a facility at this time. Patient states she is "undecided".   Patient states that her main support is her cousin, who is also present.   Becky Morales [phone]  Willette Brace [phone] ED [name] 07/11/2015 6:49 PM

## 2015-07-11 NOTE — ED Notes (Signed)
Bed: WA20 Expected date:  Expected time:  Means of arrival:  Comments: Ems- elderly

## 2015-07-11 NOTE — ED Notes (Signed)
Dr. Darl Householder to the pt bedside to speak with the pt and family in regards to transfer, at this time POA agrees to transfer to [facility], carelink updated on status

## 2015-07-11 NOTE — H&P (Signed)
[facility] History and Physical  Becky Morales DVV:[id number] DOB: 1920/01/03 DOA: 07/11/2015  Referring physician: Shirlyn Goltz, MD PCP: Garret Reddish, MD   Chief Complaint: hip fracture  HPI: Becky Morales is a 81 y.o. female with history of HTN HLD CHF GERD Hyperglycemia presents to the ED with falling episodes and possible syncope. Apparently she has been having passing out spells at home. She did not hit her head or hip. She presented to the ED with pain in the right hip. Patient had a Xray done which reveals presence of a subcapital femoral neck fracture. ED spoke with orthopedics and they would like to have her transferred to [facility] for probable surgery. She will need a syncope workup also   Review of Systems:  Complete 12 ROS performed and is unremarkable other than HPI.   Past Medical History  Diagnosis Date  . CARCINOID TUMOR 04/13/2007  . HYPERTENSION 04/13/2007  . ALLERGIC RHINITIS 04/13/2007  . GERD 04/13/2007  . HYPERGLYCEMIA 04/13/2007  . CHOLELITHIASIS 05/02/2010  . Arthritis   . Cancer   . Environmental allergies     has mucus in throat  . Diphtheria     age 7  . CAD (coronary artery disease)     NSTEMI.  LHC 09/03/11: pLAD 40-50%, pDx 80-90% (very small), lateral branch of OM 99%, m-dCFX 40%, oRCA 80%, m-dRCA 50-70%, EF 55%.  She had PCI with BMS to lateral branch of the OM.  RCA was tx medically.   . Hyponatremia   . Murmur     echo 11/12: EF 55-60%, mod calcified AV annulus, mild AI, mild MR  . Dermatophytosis of nail   . UTI (urinary tract infection) 03/2015   Past Surgical History  Procedure Laterality Date  . Tonsillectomy      many years ago  . Cardiac surgery    . Colon surgery      CANCER  . Small bowel repair    . Left heart catheterization with coronary angiogram N/A 09/03/2011    Procedure: LEFT HEART CATHETERIZATION WITH CORONARY ANGIOGRAM;  Surgeon: Peter M Martinique, MD;  Location: [facility] CATH LAB;  Service: Cardiovascular;  Laterality: N/A;  .  Percutaneous coronary stent intervention (pci-s)  09/03/2011    Procedure: PERCUTANEOUS CORONARY STENT INTERVENTION (PCI-S);  Surgeon: Peter M Martinique, MD;  Location: [facility] CATH LAB;  Service: Cardiovascular;;   Social History:  reports that she has never smoked. She has never used smokeless tobacco. She reports that she does not drink alcohol or use illicit drugs.  Allergies  Allergen Reactions  . Ciprofloxacin     emesis  . Paxil [Paroxetine Hcl] Nausea And Vomiting    Family History  Problem Relation Age of Onset  . Hypertension Mother   . Uterine cancer Mother   . Ovarian cancer Mother   . Hypertension Father   . Stroke Father   . Hypertension Brother   . Hyperlipidemia Brother   . Heart attack Brother   . Lung cancer Sister     Prior to Admission medications   Medication Sig Start Date End Date Taking? Authorizing Provider  aspirin 81 MG tablet Take 81 mg by mouth daily.    Yes Historical Provider, MD  Aspirin-Acetaminophen (GOODYS BODY PAIN PO) Take 1 Package by mouth 2 (two) times daily as needed (pain).   Yes Historical Provider, MD  atorvastatin (LIPITOR) 80 MG tablet Take 80 mg by mouth once a week. Take on Mondays   Yes Historical Provider, MD  calcium-vitamin D (  OSCAL WITH D) 500-200 MG-UNIT per tablet Take 1 tablet by mouth 2 (two) times daily.   Yes Historical Provider, MD  feeding supplement, ENSURE ENLIVE, (ENSURE ENLIVE) LIQD Take 237 mLs by mouth 2 (two) times daily between meals. 04/07/15  Yes Annita Brod, MD  metoprolol tartrate (LOPRESSOR) 50 MG tablet Take 1 tablet (50 mg total) by mouth 2 (two) times daily. 06/02/15  Yes Delfina Redwood, MD  Multiple Vitamin (MULTI VITAMIN DAILY) TABS Take 1 tablet by mouth daily. Pt. Not sure of dose.   Yes Historical Provider, MD  nitroGLYCERIN (NITROSTAT) 0.4 MG SL tablet Place 1 tablet (0.4 mg total) under the tongue every 5 (five) minutes as needed for chest pain. 02/24/15  Yes Marin Olp, MD  pantoprazole  (PROTONIX) 40 MG tablet Take 1 tablet (40 mg total) by mouth daily. 02/24/15  Yes Marin Olp, MD  valsartan (DIOVAN) 320 MG tablet Take 1 tablet (320 mg total) by mouth daily. 06/02/15  Yes Delfina Redwood, MD  acetaminophen (TYLENOL) 325 MG tablet Take 2 tablets (650 mg total) by mouth every 6 (six) hours as needed for mild pain (or Fever >/= 101). Patient not taking: Reported on 07/11/2015 06/02/15   Delfina Redwood, MD   Physical Exam: Filed Vitals:   07/11/15 1641  BP: 164/92  Pulse: 66  Temp: 98.7 F (37.1 C)  TempSrc: Oral  Resp: 16  Weight: 53.978 kg (119 lb)  SpO2: 98%    Wt Readings from Last 3 Encounters:  07/11/15 53.978 kg (119 lb)  06/03/15 61.417 kg (135 lb 6.4 oz)  04/27/15 63.05 kg (139 lb)    General:  Appears calm and comfortable Eyes: PERRL, normal lids, irises & conjunctiva ENT: grossly normal hearing, lips & tongue Neck: no LAD, masses or thyromegaly Cardiovascular: RRR, no m/r/g. No LE edema. Telemetry: SR, no arrhythmias  Respiratory: CTA bilaterally, no w/r/r Abdomen: soft, ntnd Skin: no rash or induration seen on limited exam Musculoskeletal: pain in right hip area Psychiatric: grossly normal mood and affect Neurologic: grossly non-focal.          Labs on Admission:  Basic Metabolic Panel:  Recent Labs Lab 07/11/15 1716  NA 130*  K 4.5  CL 97*  CO2 25  GLUCOSE 162*  BUN 19  CREATININE 1.14*  CALCIUM 9.4   Liver Function Tests:  Recent Labs Lab 07/11/15 1716  AST 29  ALT 21  ALKPHOS 102  BILITOT 0.7  PROT 7.3  ALBUMIN 3.4*   No results for input(s): LIPASE, AMYLASE in the last 168 hours. No results for input(s): AMMONIA in the last 168 hours. CBC:  Recent Labs Lab 07/11/15 1716  WBC 5.3  NEUTROABS 4.2  HGB 17.5*  HCT 49.6*  MCV 91.2  PLT 426*   Cardiac Enzymes: No results for input(s): CKTOTAL, CKMB, CKMBINDEX, TROPONINI in the last 168 hours.  BNP (last 3 results)  Recent Labs  04/07/15 1130  05/25/15 2221  BNP 306.0* 154.7*    ProBNP (last 3 results)  Recent Labs  08/03/14 1643  PROBNP 181.0*    CBG: No results for input(s): GLUCAP in the last 168 hours.  Radiological Exams on Admission: Dg Chest 2 View  07/11/2015   CLINICAL DATA:  Fall from bed.  Right hip pain.  EXAM: CHEST  2 VIEW  COMPARISON:  05/29/2015  FINDINGS: The heart size and mediastinal contours are within normal limits. Both lungs are clear. The bones appear diffusely osteopenic. There is no acute  fracture or subluxation identified.  IMPRESSION: No active cardiopulmonary disease.   Electronically Signed   By: Kerby Moors M.D.   On: 07/11/2015 17:52   Ct Head Wo Contrast  07/11/2015   CLINICAL DATA:  Pt is from home, fell out of bed, right hip pain today, left sided temporal abrasion. Denies LOC, no blood thinners. No obvious deformity. Pt usually uses a walker at home, is unsteady on her feet. Pt also had a fall on Saturday  EXAM: CT HEAD WITHOUT CONTRAST  CT CERVICAL SPINE WITHOUT CONTRAST  TECHNIQUE: Multidetector CT imaging of the head and cervical spine was performed following the standard protocol without intravenous contrast. Multiplanar CT image reconstructions of the cervical spine were also generated.  COMPARISON:  05/25/2015  FINDINGS: CT HEAD FINDINGS  Partial opacification of right mastoid air cells as before. Atherosclerotic and physiologic intracranial calcifications. Diffuse parenchymal atrophy. Prominence of lateral ventricles as before. Patchy areas of hypoattenuation in deep and periventricular white matter bilaterally. Negative for acute intracranial hemorrhage, mass lesion, acute infarction, midline shift, or mass-effect. Acute infarct may be inapparent on noncontrast CT. Ventricles and sulci symmetric. Bone windows demonstrate no focal lesion.  CT CERVICAL SPINE FINDINGS  Normal alignment. No prevertebral soft tissue swelling. Facets are seated. Mild narrowing of the C2-3 and C5-6 interspaces.  Fairly exuberant degenerative change at the atlantoaxial articulation. Anterior and posterior endplate spurring B2-W4. Negative for fracture. Bilateral carotid calcified plaque. Scarring in the visualized lung apices. Calcification in the apical segment left upper lobe, incompletely visualized.  IMPRESSION: 1. Negative for bleed or other acute intracranial process. 2. Atrophy and nonspecific white matter changes as before. 3. Negative for cervical fracture or other acute bone abnormality. 4. Multilevel cervical degenerative changes as above.   Electronically Signed   By: Lucrezia Europe M.D.   On: 07/11/2015 18:01   Ct Cervical Spine Wo Contrast  07/11/2015   CLINICAL DATA:  Pt is from home, fell out of bed, right hip pain today, left sided temporal abrasion. Denies LOC, no blood thinners. No obvious deformity. Pt usually uses a walker at home, is unsteady on her feet. Pt also had a fall on Saturday  EXAM: CT HEAD WITHOUT CONTRAST  CT CERVICAL SPINE WITHOUT CONTRAST  TECHNIQUE: Multidetector CT imaging of the head and cervical spine was performed following the standard protocol without intravenous contrast. Multiplanar CT image reconstructions of the cervical spine were also generated.  COMPARISON:  05/25/2015  FINDINGS: CT HEAD FINDINGS  Partial opacification of right mastoid air cells as before. Atherosclerotic and physiologic intracranial calcifications. Diffuse parenchymal atrophy. Prominence of lateral ventricles as before. Patchy areas of hypoattenuation in deep and periventricular white matter bilaterally. Negative for acute intracranial hemorrhage, mass lesion, acute infarction, midline shift, or mass-effect. Acute infarct may be inapparent on noncontrast CT. Ventricles and sulci symmetric. Bone windows demonstrate no focal lesion.  CT CERVICAL SPINE FINDINGS  Normal alignment. No prevertebral soft tissue swelling. Facets are seated. Mild narrowing of the C2-3 and C5-6 interspaces. Fairly exuberant  degenerative change at the atlantoaxial articulation. Anterior and posterior endplate spurring X3-K4. Negative for fracture. Bilateral carotid calcified plaque. Scarring in the visualized lung apices. Calcification in the apical segment left upper lobe, incompletely visualized.  IMPRESSION: 1. Negative for bleed or other acute intracranial process. 2. Atrophy and nonspecific white matter changes as before. 3. Negative for cervical fracture or other acute bone abnormality. 4. Multilevel cervical degenerative changes as above.   Electronically Signed   By: Eden Emms.D.  On: 07/11/2015 18:01   Dg Hip Unilat With Pelvis 2-3 Views Right  07/11/2015   CLINICAL DATA:  Right hip pain  EXAM: DG HIP (WITH OR WITHOUT PELVIS) 2-3V RIGHT  COMPARISON:  05/26/2015  FINDINGS: There is foreshortening of the right femoral neck which is suspicious for a subcapital femoral neck fracture. There is advanced osteoarthritis involving the right hip joint with near bone-on-bone contact. Marginal spur formation is noted. No dislocation.  IMPRESSION: 1. Findings are worrisome for impacted subcapital femoral neck fracture involving the right hip. Recommend confirmatory imaging with right hip CT or MRI. 2. Osteopenia. 3. Degenerative joint disease.   Electronically Signed   By: Kerby Moors M.D.   On: 07/11/2015 17:49      Assessment/Plan Principal Problem:   Closed displaced fracture of right femoral neck Active Problems:   Essential hypertension   Chronic diastolic heart failure   HLD (hyperlipidemia)   Syncope   Hyperglycemia   1. Closed fracture of right femoral neck -patient will be seen by orthopedics at [facility] -will trasnfer to [facility] for repair  2. HTN -continue with lopressor avapro -monitor pressures  3. HLD -on statins  4. Chronic diastolic Heart failure -monitor IOs -will continue with lopressor  5. Syncope -will get echo and also will get carotid doppler  6. Hyperglycemia -will check  A1C -monitor FSBS    Code Status: DNR (must indicate code status--if unknown or must be presumed, indicate so) DVT Prophylaxis:SCD Family Communication: none (indicate person spoken with, if applicable, with phone number if by telephone) Disposition Plan: home (indicate anticipated LOS)    [facility] Pager [phone]

## 2015-07-11 NOTE — Consult Note (Signed)
ORTHOPAEDIC CONSULTATION  REQUESTING PHYSICIAN: Allyne Gee, MD  Chief Complaint: R hip pain  HPI: Becky Morales is a 81 y.o. female who complains of R hip pain after a mechanical fall at home today.  Per the ED report, the patient had a questionable syncopal episode prior to the fall.  Per the family, they feel that she just fell out of the bed, as she was found on the floor near the bed.  Nobody witnessed the fall.  The family reports that she lives alone, but has family coming in to help daily.  She is ambulatory with the assistance of a cane at base.  The family does report that she has had increased falls in the past year.  The patient has a history of CAD with stenting in 8638 and diastolic congestive heart failure.    Past Medical History  Diagnosis Date  . CARCINOID TUMOR 04/13/2007  . HYPERTENSION 04/13/2007  . ALLERGIC RHINITIS 04/13/2007  . GERD 04/13/2007  . HYPERGLYCEMIA 04/13/2007  . CHOLELITHIASIS 05/02/2010  . Arthritis   . Cancer   . Environmental allergies     has mucus in throat  . Diphtheria     age 66  . CAD (coronary artery disease)     NSTEMI.  LHC 09/03/11: pLAD 40-50%, pDx 80-90% (very small), lateral branch of OM 99%, m-dCFX 40%, oRCA 80%, m-dRCA 50-70%, EF 55%.  She had PCI with BMS to lateral branch of the OM.  RCA was tx medically.   . Hyponatremia   . Murmur     echo 11/12: EF 55-60%, mod calcified AV annulus, mild AI, mild MR  . Dermatophytosis of nail   . UTI (urinary tract infection) 03/2015   Past Surgical History  Procedure Laterality Date  . Tonsillectomy      many years ago  . Cardiac surgery    . Colon surgery      CANCER  . Small bowel repair    . Left heart catheterization with coronary angiogram N/A 09/03/2011    Procedure: LEFT HEART CATHETERIZATION WITH CORONARY ANGIOGRAM;  Surgeon: Peter M Martinique, MD;  Location: [facility] CATH LAB;  Service: Cardiovascular;  Laterality: N/A;  . Percutaneous coronary stent intervention (pci-s)  09/03/2011      Procedure: PERCUTANEOUS CORONARY STENT INTERVENTION (PCI-S);  Surgeon: Peter M Martinique, MD;  Location: [facility] CATH LAB;  Service: Cardiovascular;;   Social History   Social History  . Marital Status: Widowed    Spouse Name: N/A  . Number of Children: N/A  . Years of Education: N/A   Social History Main Topics  . Smoking status: Never Smoker   . Smokeless tobacco: Never Used  . Alcohol Use: No  . Drug Use: No  . Sexual Activity: No   Other Topics Concern  . None   Social History Narrative   Widowed  Over 25 years ago. Lives by herself, but has many family members that live in the area. Friends to support as well.       Does not drive much since flu shot in 2014 when she said she got to feeling poorly   At home-cooks, clean, cloth, shops for self, handles finances      Hobbies: previously liked to travel, talk to friends, reading      Wants 1st cousin to help make decisions but does not want HCPOA Renea Ee and Kenyon Ana      DNR/DNI   Family History  Problem Relation Age of  Onset  . Hypertension Mother   . Uterine cancer Mother   . Ovarian cancer Mother   . Hypertension Father   . Stroke Father   . Hypertension Brother   . Hyperlipidemia Brother   . Heart attack Brother   . Lung cancer Sister    Allergies  Allergen Reactions  . Ciprofloxacin     emesis  . Paxil [Paroxetine Hcl] Nausea And Vomiting   Prior to Admission medications   Medication Sig Start Date End Date Taking? Authorizing Provider  aspirin 81 MG tablet Take 81 mg by mouth daily.    Yes Historical Provider, MD  Aspirin-Acetaminophen (GOODYS BODY PAIN PO) Take 1 Package by mouth 2 (two) times daily as needed (pain).   Yes Historical Provider, MD  atorvastatin (LIPITOR) 80 MG tablet Take 80 mg by mouth once a week. Take on Mondays   Yes Historical Provider, MD  calcium-vitamin D (OSCAL WITH D) 500-200 MG-UNIT per tablet Take 1 tablet by mouth 2 (two) times daily.   Yes Historical Provider, MD   feeding supplement, ENSURE ENLIVE, (ENSURE ENLIVE) LIQD Take 237 mLs by mouth 2 (two) times daily between meals. 04/07/15  Yes Annita Brod, MD  metoprolol tartrate (LOPRESSOR) 50 MG tablet Take 1 tablet (50 mg total) by mouth 2 (two) times daily. 06/02/15  Yes Delfina Redwood, MD  Multiple Vitamin (MULTI VITAMIN DAILY) TABS Take 1 tablet by mouth daily. Pt. Not sure of dose.   Yes Historical Provider, MD  nitroGLYCERIN (NITROSTAT) 0.4 MG SL tablet Place 1 tablet (0.4 mg total) under the tongue every 5 (five) minutes as needed for chest pain. 02/24/15  Yes Marin Olp, MD  pantoprazole (PROTONIX) 40 MG tablet Take 1 tablet (40 mg total) by mouth daily. 02/24/15  Yes Marin Olp, MD  valsartan (DIOVAN) 320 MG tablet Take 1 tablet (320 mg total) by mouth daily. 06/02/15  Yes Delfina Redwood, MD  acetaminophen (TYLENOL) 325 MG tablet Take 2 tablets (650 mg total) by mouth every 6 (six) hours as needed for mild pain (or Fever >/= 101). Patient not taking: Reported on 07/11/2015 06/02/15   Delfina Redwood, MD   Dg Chest 2 View  07/11/2015   CLINICAL DATA:  Fall from bed.  Right hip pain.  EXAM: CHEST  2 VIEW  COMPARISON:  05/29/2015  FINDINGS: The heart size and mediastinal contours are within normal limits. Both lungs are clear. The bones appear diffusely osteopenic. There is no acute fracture or subluxation identified.  IMPRESSION: No active cardiopulmonary disease.   Electronically Signed   By: Kerby Moors M.D.   On: 07/11/2015 17:52   Ct Head Wo Contrast  07/11/2015   CLINICAL DATA:  Pt is from home, fell out of bed, right hip pain today, left sided temporal abrasion. Denies LOC, no blood thinners. No obvious deformity. Pt usually uses a walker at home, is unsteady on her feet. Pt also had a fall on Saturday  EXAM: CT HEAD WITHOUT CONTRAST  CT CERVICAL SPINE WITHOUT CONTRAST  TECHNIQUE: Multidetector CT imaging of the head and cervical spine was performed following the standard  protocol without intravenous contrast. Multiplanar CT image reconstructions of the cervical spine were also generated.  COMPARISON:  05/25/2015  FINDINGS: CT HEAD FINDINGS  Partial opacification of right mastoid air cells as before. Atherosclerotic and physiologic intracranial calcifications. Diffuse parenchymal atrophy. Prominence of lateral ventricles as before. Patchy areas of hypoattenuation in deep and periventricular white matter bilaterally. Negative for acute  intracranial hemorrhage, mass lesion, acute infarction, midline shift, or mass-effect. Acute infarct may be inapparent on noncontrast CT. Ventricles and sulci symmetric. Bone windows demonstrate no focal lesion.  CT CERVICAL SPINE FINDINGS  Normal alignment. No prevertebral soft tissue swelling. Facets are seated. Mild narrowing of the C2-3 and C5-6 interspaces. Fairly exuberant degenerative change at the atlantoaxial articulation. Anterior and posterior endplate spurring Z6-X0. Negative for fracture. Bilateral carotid calcified plaque. Scarring in the visualized lung apices. Calcification in the apical segment left upper lobe, incompletely visualized.  IMPRESSION: 1. Negative for bleed or other acute intracranial process. 2. Atrophy and nonspecific white matter changes as before. 3. Negative for cervical fracture or other acute bone abnormality. 4. Multilevel cervical degenerative changes as above.   Electronically Signed   By: Lucrezia Europe M.D.   On: 07/11/2015 18:01   Ct Cervical Spine Wo Contrast  07/11/2015   CLINICAL DATA:  Pt is from home, fell out of bed, right hip pain today, left sided temporal abrasion. Denies LOC, no blood thinners. No obvious deformity. Pt usually uses a walker at home, is unsteady on her feet. Pt also had a fall on Saturday  EXAM: CT HEAD WITHOUT CONTRAST  CT CERVICAL SPINE WITHOUT CONTRAST  TECHNIQUE: Multidetector CT imaging of the head and cervical spine was performed following the standard protocol without intravenous  contrast. Multiplanar CT image reconstructions of the cervical spine were also generated.  COMPARISON:  05/25/2015  FINDINGS: CT HEAD FINDINGS  Partial opacification of right mastoid air cells as before. Atherosclerotic and physiologic intracranial calcifications. Diffuse parenchymal atrophy. Prominence of lateral ventricles as before. Patchy areas of hypoattenuation in deep and periventricular white matter bilaterally. Negative for acute intracranial hemorrhage, mass lesion, acute infarction, midline shift, or mass-effect. Acute infarct may be inapparent on noncontrast CT. Ventricles and sulci symmetric. Bone windows demonstrate no focal lesion.  CT CERVICAL SPINE FINDINGS  Normal alignment. No prevertebral soft tissue swelling. Facets are seated. Mild narrowing of the C2-3 and C5-6 interspaces. Fairly exuberant degenerative change at the atlantoaxial articulation. Anterior and posterior endplate spurring R6-E4. Negative for fracture. Bilateral carotid calcified plaque. Scarring in the visualized lung apices. Calcification in the apical segment left upper lobe, incompletely visualized.  IMPRESSION: 1. Negative for bleed or other acute intracranial process. 2. Atrophy and nonspecific white matter changes as before. 3. Negative for cervical fracture or other acute bone abnormality. 4. Multilevel cervical degenerative changes as above.   Electronically Signed   By: Lucrezia Europe M.D.   On: 07/11/2015 18:01   Dg Hip Unilat With Pelvis 2-3 Views Right  07/11/2015   CLINICAL DATA:  Right hip pain  EXAM: DG HIP (WITH OR WITHOUT PELVIS) 2-3V RIGHT  COMPARISON:  05/26/2015  FINDINGS: There is foreshortening of the right femoral neck which is suspicious for a subcapital femoral neck fracture. There is advanced osteoarthritis involving the right hip joint with near bone-on-bone contact. Marginal spur formation is noted. No dislocation.  IMPRESSION: 1. Findings are worrisome for impacted subcapital femoral neck fracture  involving the right hip. Recommend confirmatory imaging with right hip CT or MRI. 2. Osteopenia. 3. Degenerative joint disease.   Electronically Signed   By: Kerby Moors M.D.   On: 07/11/2015 17:49    Positive ROS: All other systems have been reviewed and were otherwise negative with the exception of those mentioned in the HPI and as above.  Labs cbc  Recent Labs  07/11/15 1716  WBC 5.3  HGB 17.5*  HCT 49.6*  PLT  426*    Labs inflam No results for input(s): CRP in the last 72 hours.  Invalid input(s): ESR  Labs coag No results for input(s): INR, PTT in the last 72 hours.  Invalid input(s): PT   Recent Labs  07/11/15 1716  NA 130*  K 4.5  CL 97*  CO2 25  GLUCOSE 162*  BUN 19  CREATININE 1.14*  CALCIUM 9.4    Physical Exam: Filed Vitals:   07/11/15 2138  BP: 146/65  Pulse: 62  Temp: 97.5 F (36.4 C)  Resp: 17   General: lethargic but arousable due to recent ativan administration on transport to the hospital Cardiovascular: Trace pedal edema Respiratory: No cyanosis, no use of accessory musculature GI: No organomegaly, abdomen is soft and non-tender SKIN: R side of face has ecchymosis from a prior fall, non-tender Neurologic: Sensation intact distally Psychiatric: Patient is not competent for consent due to dementia, the cousins is the legal medical decision maker   MUSCULOSKELETAL:  Right hip is not shortened.  Tender to palpation. Decreased ROM due to pain.  Sensation intact with 2+ distal pulses.  Other extremities are atraumatic with painless ROM and NVI.  Assessment: Right subcapital femoral neck fracture  Plan: Per family, the patient live independently with family support and is ambulatory at baseline with min assistance of a cane.  In effort to maintain mobility, surgical intervention would be advised.  No family was at the bedside this evening, so we will touch base with them in the morning to further plan treatment.  If family opts for  surgery, will likely need cardiac clearance.  Tentatively planning to take to the OR tomorrow afternoon.  Will have the patient remain NPO until a decision is made.  Bedrest at this time.    [facility] [phone]   07/11/2015 11:48 PM

## 2015-07-11 NOTE — ED Notes (Signed)
Bed: WA20 Expected date:  Expected time:  Means of arrival:  Comments: Pt still in rm

## 2015-07-11 NOTE — ED Notes (Signed)
[facility] was able to transfer pt over to the stretcher, pt became combative and yelling loudly, unable to restrain pt with seatbelts. Dr. Darl Householder notified, order for ativan. Pt calmer at time of transfer

## 2015-07-11 NOTE — ED Notes (Signed)
In to get pt to sign consent for transfer to [facility], pt is refusing to go to [facility], Dr. Darl Householder was aware of the same and making arrangements to go to [facility]. Pt refusing [facility] stating she wants to go home. POA at the bedside, speaking with Dr. Darl Householder, POA Renea Ee) consents to transfer to [facility] at this time.

## 2015-07-11 NOTE — ED Notes (Signed)
Pt arrives via EMS from home, per EMS report, the pt is from home, fell out of bed, right hip pain today, left sided temporal abrasion. Denies LOC, no blood thinners. No obvious deformity. Pt usually uses a walker at home, is unsteady on her feet. Pt also had a fall on Saturday but was not evaluated at that time.

## 2015-07-11 NOTE — ED Notes (Addendum)
Called [place] on 5 N and updated her on patient status, [facility] en route

## 2015-07-12 ENCOUNTER — Inpatient Hospital Stay (HOSPITAL_COMMUNITY): Payer: Medicare Other

## 2015-07-12 ENCOUNTER — Encounter (HOSPITAL_COMMUNITY): Payer: Self-pay | Admitting: Certified Registered Nurse Anesthetist

## 2015-07-12 DIAGNOSIS — I5032 Chronic diastolic (congestive) heart failure: Secondary | ICD-10-CM

## 2015-07-12 DIAGNOSIS — S72001S Fracture of unspecified part of neck of right femur, sequela: Secondary | ICD-10-CM

## 2015-07-12 DIAGNOSIS — R55 Syncope and collapse: Secondary | ICD-10-CM

## 2015-07-12 DIAGNOSIS — E43 Unspecified severe protein-calorie malnutrition: Secondary | ICD-10-CM | POA: Insufficient documentation

## 2015-07-12 DIAGNOSIS — Z01810 Encounter for preprocedural cardiovascular examination: Secondary | ICD-10-CM

## 2015-07-12 DIAGNOSIS — I10 Essential (primary) hypertension: Secondary | ICD-10-CM

## 2015-07-12 LAB — BASIC METABOLIC PANEL
ANION GAP: 7 (ref 5–15)
Anion gap: 8 (ref 5–15)
BUN: 18 mg/dL (ref 6–20)
BUN: 18 mg/dL (ref 6–20)
CALCIUM: 8.7 mg/dL — AB (ref 8.9–10.3)
CHLORIDE: 98 mmol/L — AB (ref 101–111)
CO2: 23 mmol/L (ref 22–32)
CO2: 24 mmol/L (ref 22–32)
CREATININE: 1.15 mg/dL — AB (ref 0.44–1.00)
CREATININE: 1.22 mg/dL — AB (ref 0.44–1.00)
Calcium: 9.2 mg/dL (ref 8.9–10.3)
Chloride: 98 mmol/L — ABNORMAL LOW (ref 101–111)
GFR calc Af Amer: 46 mL/min — ABNORMAL LOW (ref >60)
GFR calc Af Amer: 43 mL/min — ABNORMAL LOW (ref >60)
GFR calc non Af Amer: 40 mL/min — ABNORMAL LOW (ref >60)
GFR, EST NON AFRICAN AMERICAN: 37 mL/min — AB (ref >60)
GLUCOSE: 88 mg/dL (ref 65–99)
GLUCOSE: 94 mg/dL (ref 65–99)
Potassium: 4.5 mmol/L (ref 3.5–5.1)
Potassium: 4.5 mmol/L (ref 3.5–5.1)
SODIUM: 129 mmol/L — AB (ref 135–145)
Sodium: 129 mmol/L — ABNORMAL LOW (ref 135–145)

## 2015-07-12 LAB — CBC
HEMATOCRIT: 48.1 % — AB (ref 36.0–46.0)
HEMOGLOBIN: 17 g/dL — AB (ref 12.0–15.0)
MCH: 32.2 pg (ref 26.0–34.0)
MCHC: 35.3 g/dL (ref 30.0–36.0)
MCV: 91.1 fL (ref 78.0–100.0)
Platelets: 358 10*3/uL (ref 150–400)
RBC: 5.28 MIL/uL — ABNORMAL HIGH (ref 3.87–5.11)
RDW: 17.7 % — ABNORMAL HIGH (ref 11.5–15.5)
WBC: 4.3 10*3/uL (ref 4.0–10.5)

## 2015-07-12 LAB — URINE CULTURE: CULTURE: NO GROWTH

## 2015-07-12 LAB — TROPONIN I
Troponin I: 0.04 ng/mL — ABNORMAL HIGH (ref <0.031)
Troponin I: 0.03 ng/mL (ref <0.031)

## 2015-07-12 MED ORDER — GLYCOPYRROLATE 0.2 MG/ML IJ SOLN
INTRAMUSCULAR | Status: AC
  Filled 2015-07-12: qty 3

## 2015-07-12 MED ORDER — LIDOCAINE HCL (CARDIAC) 20 MG/ML IV SOLN
INTRAVENOUS | Status: AC
  Filled 2015-07-12: qty 5

## 2015-07-12 MED ORDER — PHENYLEPHRINE HCL 10 MG/ML IJ SOLN
INTRAMUSCULAR | Status: AC
  Filled 2015-07-12: qty 1

## 2015-07-12 MED ORDER — FENTANYL CITRATE (PF) 250 MCG/5ML IJ SOLN
INTRAMUSCULAR | Status: AC
  Filled 2015-07-12: qty 5

## 2015-07-12 MED ORDER — ONDANSETRON HCL 4 MG/2ML IJ SOLN
INTRAMUSCULAR | Status: AC
  Filled 2015-07-12: qty 2

## 2015-07-12 MED ORDER — NEOSTIGMINE METHYLSULFATE 10 MG/10ML IV SOLN
INTRAVENOUS | Status: AC
  Filled 2015-07-12: qty 1

## 2015-07-12 MED ORDER — SODIUM CHLORIDE 0.9 % IV SOLN
INTRAVENOUS | Status: DC
  Administered 2015-07-12: 100 mL/h via INTRAVENOUS
  Administered 2015-07-13 – 2015-07-14 (×3): via INTRAVENOUS

## 2015-07-12 MED ORDER — LABETALOL HCL 5 MG/ML IV SOLN
INTRAVENOUS | Status: AC
  Filled 2015-07-12: qty 4

## 2015-07-12 MED ORDER — ROCURONIUM BROMIDE 50 MG/5ML IV SOLN
INTRAVENOUS | Status: AC
  Filled 2015-07-12: qty 2

## 2015-07-12 MED ORDER — SODIUM CHLORIDE 0.9 % IV BOLUS (SEPSIS)
500.0000 mL | Freq: Once | INTRAVENOUS | Status: AC
  Administered 2015-07-12: 500 mL via INTRAVENOUS

## 2015-07-12 MED ORDER — SUCCINYLCHOLINE CHLORIDE 20 MG/ML IJ SOLN
INTRAMUSCULAR | Status: AC
  Filled 2015-07-12: qty 1

## 2015-07-12 NOTE — Progress Notes (Signed)
Called [facility] [name]  for message and left message to return call as soon as possible. I also called Renea Ee, friend and contact for patient, also left message.   Patient is refusing surgery at this time.  Nsg to continue to monitor for status changes.

## 2015-07-12 NOTE — Progress Notes (Addendum)
[facility] Progress Note   Becky Morales  YYT:[id number]  DOB: 1920/01/02  DOA: 07/11/2015 PCP: Garret Reddish, MD  Brief narrative: Becky Morales is a 81 y.o. female history of hypertension, hyperlipidemia, coronary artery disease with bare metal stent in 2012. Who presents to the hospital via EMS after a fall with a complaint of right hip pain. The patient stated that she passed out twice but there were no witnesses to her episodes.   Subjective: She is awake and alert. Unable to tell me if she passed out yesterday or not. She tells me that she was at [facility] yesterday and is unable to describe to me the events leading up to the admission. No complaint of nausea vomiting diarrhea or abdominal pain lightheadedness or chest pain. She does not describe having any recent chest pain on exertion.  Assessment/Plan: Principal Problem:   Closed displaced fracture of right femoral neck -Management per orthopedic surgery -With history of coronary artery disease, hypertension, age and possible syncope, she is a moderate risk for surgery  Active Problems: Syncope? Fall? -Difficult to tell how oriented the patient is-have tried reaching her caretaker and have left a message for her -no call back as of yet -According to notes in the computer, it is reported that may she may have fallen out of bed -CT of the head without contrast negative for bleed or acute intracranial process -CT C-spine negative for acute fracture -Carotid duplex does not reveal any carotid artery stenosis -2-D echo recently performed reported as EF of 55-60% and grade 1 diastolic dysfunction with moderate concentric hypertrophy-no focal wall motion abnormality was noted -2-D echo today is unchanged from prior -Troponin performed in the ER was negative--continue to follow troponin -EKG shows sinus rhythm with an inverted T-wave in aVL    Essential hypertension -Continue  Metoprolol -Hold Avapro due to acute  renal failure    Chronic diastolic heart failure -Only grade 1 diastolic dysfunction documented on echo -Not on diuretics at home  Hyperlipidemia -Continue Lipitor  Severe protein calorie malnutrition -Continue ensure  Acute renal failure -Prerenal versus ATN-continue to hydrate and follow-monitor for hypotension-hold Avapro  Hyponatremia -Hydrate with normal saline and follow   Code Status:     Code Status Orders        Start     Ordered   07/11/15 2123  Full code   Continuous     07/11/15 2122     Family Communication: unable to reach caretaker Disposition Plan:  PT to determine if she needs to go to SNF DVT prophylaxis: Per ortho Consultants: Orthopedic surgery Procedures: Bilateral carotid duplex Findings suggest 1-39% internal carotid artery stenosis bilaterally. Vertebral arteries are patent with antegrade flow.  Antibiotics: Anti-infectives    None      Objective: Filed Weights   07/11/15 1641  Weight: 53.978 kg (119 lb)    Intake/Output Summary (Last 24 hours) at 07/12/15 1124 Last data filed at 07/12/15 0629  Gross per 24 hour  Intake    100 ml  Output      0 ml  Net    100 ml     Vitals Filed Vitals:   07/11/15 2040 07/11/15 2138 07/12/15 0634 07/12/15 1012  BP:  146/65 142/61 135/47  Pulse:  62 74 77  Temp: 97.5 F (36.4 C) 97.5 F (36.4 C) 97.8 F (36.6 C)   TempSrc:  Oral    Resp:  17 17 18   Weight:      SpO2:  100% 100% 100%    Exam:  General:  Pt is alert,  oriented to person time and place but not to situation-  not in acute distress  HEENT: No icterus, No thrush, oral mucosa moist  Cardiovascular: regular rate and rhythm, S1/S2 No murmur  Respiratory: clear to auscultation bilaterally   Abdomen: Soft, +Bowel sounds, non tender, non distended, no guarding  MSK: No LE edema, cyanosis or clubbing  Data Reviewed: Basic Metabolic Panel:  Recent Labs Lab 07/11/15 1716 07/12/15 0532  NA 130* 129*  K 4.5 4.5  CL  97* 98*  CO2 25 23  GLUCOSE 162* 88  BUN 19 18  CREATININE 1.14* 1.15*  CALCIUM 9.4 9.2   Liver Function Tests:  Recent Labs Lab 07/11/15 1716  AST 29  ALT 21  ALKPHOS 102  BILITOT 0.7  PROT 7.3  ALBUMIN 3.4*   No results for input(s): LIPASE, AMYLASE in the last 168 hours. No results for input(s): AMMONIA in the last 168 hours. CBC:  Recent Labs Lab 07/11/15 1716 07/12/15 0532  WBC 5.3 4.3  NEUTROABS 4.2  --   HGB 17.5* 17.0*  HCT 49.6* 48.1*  MCV 91.2 91.1  PLT 426* 358   Cardiac Enzymes: No results for input(s): CKTOTAL, CKMB, CKMBINDEX, TROPONINI in the last 168 hours. BNP (last 3 results)  Recent Labs  04/07/15 1130 05/25/15 2221  BNP 306.0* 154.7*    ProBNP (last 3 results)  Recent Labs  08/03/14 1643  PROBNP 181.0*    CBG: No results for input(s): GLUCAP in the last 168 hours.  Recent Results (from the past 240 hour(s))  Urine culture     Status: None (Preliminary result)   Collection Time: 07/11/15  6:01 PM  Result Value Ref Range Status   Specimen Description Urine  Final   Special Requests NONE  Final   Culture   Final    NO GROWTH < 12 HOURS Performed at [facility]    Report Status PENDING  Incomplete     Studies: Dg Chest 2 View  07/11/2015   CLINICAL DATA:  Fall from bed.  Right hip pain.  EXAM: CHEST  2 VIEW  COMPARISON:  05/29/2015  FINDINGS: The heart size and mediastinal contours are within normal limits. Both lungs are clear. The bones appear diffusely osteopenic. There is no acute fracture or subluxation identified.  IMPRESSION: No active cardiopulmonary disease.   Electronically Signed   By: Kerby Moors M.D.   On: 07/11/2015 17:52   Ct Head Wo Contrast  07/11/2015   CLINICAL DATA:  Pt is from home, fell out of bed, right hip pain today, left sided temporal abrasion. Denies LOC, no blood thinners. No obvious deformity. Pt usually uses a walker at home, is unsteady on her feet. Pt also had a fall on Saturday   EXAM: CT HEAD WITHOUT CONTRAST  CT CERVICAL SPINE WITHOUT CONTRAST  TECHNIQUE: Multidetector CT imaging of the head and cervical spine was performed following the standard protocol without intravenous contrast. Multiplanar CT image reconstructions of the cervical spine were also generated.  COMPARISON:  05/25/2015  FINDINGS: CT HEAD FINDINGS  Partial opacification of right mastoid air cells as before. Atherosclerotic and physiologic intracranial calcifications. Diffuse parenchymal atrophy. Prominence of lateral ventricles as before. Patchy areas of hypoattenuation in deep and periventricular white matter bilaterally. Negative for acute intracranial hemorrhage, mass lesion, acute infarction, midline shift, or mass-effect. Acute infarct may be inapparent on noncontrast CT. Ventricles and sulci symmetric. Bone windows demonstrate  no focal lesion.  CT CERVICAL SPINE FINDINGS  Normal alignment. No prevertebral soft tissue swelling. Facets are seated. Mild narrowing of the C2-3 and C5-6 interspaces. Fairly exuberant degenerative change at the atlantoaxial articulation. Anterior and posterior endplate spurring M8-U1. Negative for fracture. Bilateral carotid calcified plaque. Scarring in the visualized lung apices. Calcification in the apical segment left upper lobe, incompletely visualized.  IMPRESSION: 1. Negative for bleed or other acute intracranial process. 2. Atrophy and nonspecific white matter changes as before. 3. Negative for cervical fracture or other acute bone abnormality. 4. Multilevel cervical degenerative changes as above.   Electronically Signed   By: Lucrezia Europe M.D.   On: 07/11/2015 18:01   Ct Cervical Spine Wo Contrast  07/11/2015   CLINICAL DATA:  Pt is from home, fell out of bed, right hip pain today, left sided temporal abrasion. Denies LOC, no blood thinners. No obvious deformity. Pt usually uses a walker at home, is unsteady on her feet. Pt also had a fall on Saturday  EXAM: CT HEAD WITHOUT  CONTRAST  CT CERVICAL SPINE WITHOUT CONTRAST  TECHNIQUE: Multidetector CT imaging of the head and cervical spine was performed following the standard protocol without intravenous contrast. Multiplanar CT image reconstructions of the cervical spine were also generated.  COMPARISON:  05/25/2015  FINDINGS: CT HEAD FINDINGS  Partial opacification of right mastoid air cells as before. Atherosclerotic and physiologic intracranial calcifications. Diffuse parenchymal atrophy. Prominence of lateral ventricles as before. Patchy areas of hypoattenuation in deep and periventricular white matter bilaterally. Negative for acute intracranial hemorrhage, mass lesion, acute infarction, midline shift, or mass-effect. Acute infarct may be inapparent on noncontrast CT. Ventricles and sulci symmetric. Bone windows demonstrate no focal lesion.  CT CERVICAL SPINE FINDINGS  Normal alignment. No prevertebral soft tissue swelling. Facets are seated. Mild narrowing of the C2-3 and C5-6 interspaces. Fairly exuberant degenerative change at the atlantoaxial articulation. Anterior and posterior endplate spurring L2-G4. Negative for fracture. Bilateral carotid calcified plaque. Scarring in the visualized lung apices. Calcification in the apical segment left upper lobe, incompletely visualized.  IMPRESSION: 1. Negative for bleed or other acute intracranial process. 2. Atrophy and nonspecific white matter changes as before. 3. Negative for cervical fracture or other acute bone abnormality. 4. Multilevel cervical degenerative changes as above.   Electronically Signed   By: Lucrezia Europe M.D.   On: 07/11/2015 18:01   Dg Hip Unilat With Pelvis 2-3 Views Right  07/11/2015   CLINICAL DATA:  Right hip pain  EXAM: DG HIP (WITH OR WITHOUT PELVIS) 2-3V RIGHT  COMPARISON:  05/26/2015  FINDINGS: There is foreshortening of the right femoral neck which is suspicious for a subcapital femoral neck fracture. There is advanced osteoarthritis involving the right hip  joint with near bone-on-bone contact. Marginal spur formation is noted. No dislocation.  IMPRESSION: 1. Findings are worrisome for impacted subcapital femoral neck fracture involving the right hip. Recommend confirmatory imaging with right hip CT or MRI. 2. Osteopenia. 3. Degenerative joint disease.   Electronically Signed   By: Kerby Moors M.D.   On: 07/11/2015 17:49    Scheduled Meds:  Scheduled Meds: . [START ON 07/17/2015] atorvastatin  80 mg Oral Q Mon-1800  . calcium-vitamin D  1 tablet Oral BID  . docusate sodium  100 mg Oral BID  . feeding supplement (ENSURE ENLIVE)  237 mL Oral BID BM  . irbesartan  300 mg Oral Daily  . metoprolol tartrate  50 mg Oral BID  . multivitamin with minerals  1 tablet Oral Daily  . pantoprazole  40 mg Oral Q1200   Continuous Infusions: . sodium chloride 10 mL/hr at 07/11/15 2203    Time spent on care of this patient: 48 min   [place], MD 07/12/2015, 11:24 AM  LOS: 1 day   [facility] Office  [phone] Pager - Text Page per [email or web address] If 7PM-7AM, please contact night-coverage [email or web address]

## 2015-07-12 NOTE — Progress Notes (Signed)
Utilization review completed.  

## 2015-07-12 NOTE — Progress Notes (Signed)
PT Cancellation Note  Patient Details Name: Becky Morales MRN: [id number] DOB: Feb 22, 1920   Cancelled Treatment:    Reason Eval/Treat Not Completed: Medical issues which prohibited therapy Pt on bedrest due to right hip fx. Possibly going to OR this PM. Will follow up post surgery to perform PT evaluation with new orders.   Marguarite Arbour A Hartshorne 07/12/2015, 8:55 AM Wray Kearns, PT, DPT [phone]

## 2015-07-12 NOTE — Progress Notes (Signed)
I spoke with her POA, Jamey Ripa [phone]  We discussed the case and the risks and benefits of surgery vs. Non-operative management. She would like to discuss the options with Mrs astella desir and tomorrow am. We will reschedule surgery for tomorrow mid morning under Dr. Marcelino Scot. I will discuss with family in the AM about OR vs. Non-op    MURPHY, TIMOTHY D

## 2015-07-12 NOTE — Progress Notes (Signed)
Initial Nutrition Assessment  DOCUMENTATION CODES:   Severe malnutrition in context of chronic illness  INTERVENTION:   Once diet advances, provide Ensure Enlive po BID, each supplement provides 350 kcal and 20 grams of protein.  NUTRITION DIAGNOSIS:   Malnutrition related to chronic illness as evidenced by severe depletion of body fat, severe depletion of muscle mass, percent weight loss.  GOAL:   Patient will meet greater than or equal to 90% of their needs  MONITOR:   Diet advancement, Weight trends, Labs, I & O's  REASON FOR ASSESSMENT:   Consult Hip fracture protocol  ASSESSMENT:   81 y.o. female with history of HTN HLD CHF GERD Hyperglycemia presents to the ED with falling episodes and possible syncope.  Pt is currently NPO for surgery today. Pt was asleep during time of visit and would not wake. No family at bedside. Unable to obtain nutrition hx. Per Epic weight records, pt with a 11.8% weight loss in 1 month. Pt currently has Ensure ordered. RD to continue with current orders.   Nutrition-Focused physical exam completed. Findings are severe fat depletion, severe muscle depletion, and mild edema.   Labs and medications reviewed.  Diet Order:  Diet NPO time specified  Skin:   (non-pitting RLE edema)  Last BM:  PTA  Height:   Ht Readings from Last 1 Encounters:  05/25/15 5\' 7"  (1.702 m)    Weight:   Wt Readings from Last 1 Encounters:  07/11/15 119 lb (53.978 kg)    Ideal Body Weight:  61 kg  BMI:  Body mass index is 18.63 kg/(m^2).  Estimated Nutritional Needs:   Kcal:  1500-1800  Protein:  70-80 grams  Fluid:  1.5 - 1.8 L/day  EDUCATION NEEDS:   No education needs identified at this time  Corrin Parker, MS, RD, LDN Pager # [phone] After hours/ weekend pager # [phone]

## 2015-07-12 NOTE — Progress Notes (Signed)
  Echocardiogram 2D Echocardiogram has been performed.  Becky Morales 07/12/2015, 2:00 PM

## 2015-07-12 NOTE — Progress Notes (Signed)
*  PRELIMINARY RESULTS* Vascular Ultrasound Carotid Duplex (Doppler) has been completed.   Findings suggest 1-39% internal carotid artery stenosis bilaterally. Vertebral arteries are patent with antegrade flow.  07/12/2015 12:41 PM Maudry Mayhew, RVT, RDCS, RDMS

## 2015-07-12 NOTE — Progress Notes (Signed)
I spoke with Butch Penny in Vascular lab to verify order of ECHO and time of procedure d/t  patient's surgery that is scheduled for 16:45 this afternoon.  Per note from orthopaedic PA, cardiac clearance to be performed prior to surgery. Lab stated they can try and will be performed at bedside.

## 2015-07-12 NOTE — Progress Notes (Signed)
Orthopedic Tech Progress Note Patient Details:  Becky Morales 01-02-1920 [id number] Patient unable to use OHF Patient ID: Becky Morales, female   DOB: 05-30-1920, 81 y.o.   MRN: [id number]   [name] 07/12/2015, 3:53 AM

## 2015-07-13 ENCOUNTER — Inpatient Hospital Stay (HOSPITAL_COMMUNITY): Payer: Medicare Other

## 2015-07-13 ENCOUNTER — Encounter (HOSPITAL_COMMUNITY)
Admission: EM | Disposition: A | Discharge status: Skilled Nursing Facility | Payer: Self-pay | Source: Home / Self Care | Attending: Internal Medicine

## 2015-07-13 DIAGNOSIS — E785 Hyperlipidemia, unspecified: Secondary | ICD-10-CM

## 2015-07-13 LAB — CBC
HCT: 45.5 % (ref 36.0–46.0)
HEMOGLOBIN: 15.7 g/dL — AB (ref 12.0–15.0)
MCH: 32.2 pg (ref 26.0–34.0)
MCHC: 34.5 g/dL (ref 30.0–36.0)
MCV: 93.2 fL (ref 78.0–100.0)
Platelets: 367 10*3/uL (ref 150–400)
RBC: 4.88 MIL/uL (ref 3.87–5.11)
RDW: 18 % — AB (ref 11.5–15.5)
WBC: 4.2 10*3/uL (ref 4.0–10.5)

## 2015-07-13 LAB — BASIC METABOLIC PANEL
ANION GAP: 5 (ref 5–15)
BUN: 13 mg/dL (ref 6–20)
CALCIUM: 8.9 mg/dL (ref 8.9–10.3)
CO2: 24 mmol/L (ref 22–32)
Chloride: 99 mmol/L — ABNORMAL LOW (ref 101–111)
Creatinine, Ser: 1.03 mg/dL — ABNORMAL HIGH (ref 0.44–1.00)
GFR calc Af Amer: 53 mL/min — ABNORMAL LOW (ref >60)
GFR, EST NON AFRICAN AMERICAN: 45 mL/min — AB (ref >60)
GLUCOSE: 88 mg/dL (ref 65–99)
Potassium: 4.7 mmol/L (ref 3.5–5.1)
SODIUM: 128 mmol/L — AB (ref 135–145)

## 2015-07-13 LAB — SODIUM, URINE, RANDOM: SODIUM UR: 120 mmol/L

## 2015-07-13 LAB — OSMOLALITY: OSMOLALITY: 276 mosm/kg (ref 275–300)

## 2015-07-13 LAB — TROPONIN I
TROPONIN I: 0.03 ng/mL (ref <0.031)
TROPONIN I: 0.04 ng/mL — AB (ref <0.031)

## 2015-07-13 SURGERY — FIXATION, FEMUR, NECK, PERCUTANEOUS, USING SCREW
Anesthesia: General | Laterality: Right

## 2015-07-13 MED ORDER — ENOXAPARIN SODIUM 30 MG/0.3ML ~~LOC~~ SOLN
30.0000 mg | SUBCUTANEOUS | Status: DC
  Administered 2015-07-13 – 2015-07-14 (×2): 30 mg via SUBCUTANEOUS
  Filled 2015-07-13 (×2): qty 0.3

## 2015-07-13 MED ORDER — HYDROCODONE-ACETAMINOPHEN 5-325 MG PO TABS
1.0000 | ORAL_TABLET | Freq: Four times a day (QID) | ORAL | Status: DC | PRN
  Administered 2015-07-13 – 2015-07-15 (×7): 1 via ORAL
  Filled 2015-07-13 (×7): qty 1

## 2015-07-13 NOTE — Progress Notes (Signed)
PT Cancellation Note  Patient Details Name: Becky Morales MRN: [id number] DOB: December 07, 1919   Cancelled Treatment:    Reason Eval/Treat Not Completed: Medical issues which prohibited therapy (Pt currently refusing surgery and is on bed rest).  Will evaluate pt once appropriate and once it has been determined if pt is to go for surgery possibly today or not.  PT will continue to follow acutely.    Joslyn Hy PT, DPT [phone] Pager: [phone] 07/13/2015, 9:13 AM

## 2015-07-13 NOTE — Progress Notes (Signed)
     Subjective:  S/P R femoral neck fracture.  Patient reports pain as mild.  Resting comfortably in bed this morning.  Patient is adamant that she will not have surgery of her hip.  Contacted family yesterday.  They were to come and see the patient and help decide treatment course.  No family has presented.  Given the patient's refusal for surgery, will cancel today.  Will consult palliative care.  PT will evaluate and help determine if discharge to home is appropriate.     Objective:   VITALS:   Filed Vitals:   07/12/15 2049 07/13/15 0554 07/13/15 0800 07/13/15 1006  BP: 146/50 163/55 159/51 152/52  Pulse: 78 65 67 63  Temp: 98.7 F (37.1 C) 97.8 F (36.6 C) 98.2 F (36.8 C) 98 F (36.7 C)  TempSrc: Oral  Oral Oral  Resp: 18 18 20 18   Weight:      SpO2: 100% 99% 100% 100%   Patient alert and oriented at this time but has periods of AMS ABD soft Neurovascular intact Sensation intact distally Intact pulses distally Dorsiflexion/Plantar flexion intact  Lab Results  Component Value Date   WBC 4.2 07/13/2015   HGB 15.7* 07/13/2015   HCT 45.5 07/13/2015   MCV 93.2 07/13/2015   PLT 367 07/13/2015   BMET    Component Value Date/Time   NA 128* 07/13/2015 0624   K 4.7 07/13/2015 0624   CL 99* 07/13/2015 0624   CO2 24 07/13/2015 0624   GLUCOSE 88 07/13/2015 0624   BUN 13 07/13/2015 0624   CREATININE 1.03* 07/13/2015 0624   CALCIUM 8.9 07/13/2015 0624   GFRNONAA 45* 07/13/2015 0624   GFRAA 53* 07/13/2015 0624     Assessment/Plan:     Principal Problem:   Closed displaced fracture of right femoral neck Active Problems:   Essential hypertension   Chronic diastolic heart failure   HLD (hyperlipidemia)   Syncope   Hyperglycemia   Closed right hip fracture   Protein-calorie malnutrition, severe   Up with therapy WBAT in the RLE Will have PT evaluate to see if discharge to home is appropriate.  Ambulatory with a cane prior to the fall, but may need rehab  before returning to home is appropriate. Have also consulted palliative care to help give the patient options since she would like non-operative treatment.   Baylin Cabal Lelan Pons 07/13/2015, 11:51 AM Cell [phone]

## 2015-07-13 NOTE — Progress Notes (Signed)
Pt refusing to be transferred to radiology for a CT scan of her R hip. Pt informed of why the CT scan was ordered in order to determine whether or not her hip fracture was acute vs. Chronic. Pt stated she understood and that it was not necessary since she was not going to have surgery. PA on call notified and informed. No new orders at this time. Pt resting comfortably in bed, no signs of distress. Nursing will continue to monitor.

## 2015-07-13 NOTE — Progress Notes (Signed)
PT Cancellation Note  Patient Details Name: Becky Morales MRN: [id number] DOB: 1920/08/19   Cancelled Treatment:    Reason Eval/Treat Not Completed: Patient declined, no reason specified (Pt refused therapy). Pt refusing PT and OT this afternoon.  Attempted to motivate pt to participate in therapy and educated pt on potential consequences of lying in bed for long durations; however pt is tearful and continues to say she just wants to rest.  PT will continue to follow acutely.  Confirmed w/ MD WBAT orders that were placed today.  Joslyn Hy PT, DPT [phone] Pager: [phone] 07/13/2015, 2:38 PM

## 2015-07-13 NOTE — Progress Notes (Signed)
OT Cancellation Note  Patient Details Name: Becky Morales MRN: [id number] DOB: 1920/03/08   Cancelled Treatment:    Reason Eval/Treat Not Completed: Medical issues which prohibited therapy. Pt currently refusing surgery and is on bed rest. Will evaluate pt once she is appropriate and it has been determined if pt will go for surgery. OT will continue to follow pt acutely.   Binnie Kand M.S., OTR/L Pager: [phone]  07/13/2015, 11:32 AM

## 2015-07-13 NOTE — Progress Notes (Signed)
OT Cancellation Note  Patient Details Name: [name] MRN: [id number] DOB: September 01, 1920   Cancelled Treatment:    Reason Eval/Treat Not Completed: Patient declined, no reason specified (Pt refused therapy ). Pt refused OT/PT this afternoon. Pt tearful, continuously declining to participate in therapy, says she is in pain and just wants to rest. OT will continue to follow acutely.   Binnie Kand M.S., OTR/L Pager: [phone]  07/13/2015, 3:14 PM

## 2015-07-13 NOTE — Progress Notes (Addendum)
[facility] Progress Note   Becky Morales  WGN:[id number]  DOB: 01-12-1920  DOA: 07/11/2015 PCP: Garret Reddish, MD  Brief narrative: Becky Morales is a 81 y.o. female history of hypertension, hyperlipidemia, coronary artery disease with bare metal stent in 2012. Who presents to the hospital via EMS after a fall with a complaint of right hip pain. The patient stated in the ER that she passed out twice. On the day of my exam (the following day), she appeared confused and was unable to tell me if she passed out or not.    Subjective: Alert, complains of pain in right leg today. No nausea, vomiting, or abdominal pain.   Assessment/Plan: Principal Problem: Right hip pain -Management per orthopedic surgery -With history of coronary artery disease, hypertension, age and possible syncope, she is a moderate risk for surgery ADDENDUM- hip CT negative for fracture - patient has severe osteoarthritis - pain control- PT eval  Active Problems: Syncope? Fall? -Difficult to tell how oriented the patient is-have tried reaching her caretaker and have left a message for her -no call back as of yet -According to notes in the computer, it is reported that may she may have fallen out of bed -CT of the head without contrast negative for bleed or acute intracranial process -CT C-spine negative for acute fracture -Carotid duplex does not reveal any carotid artery stenosis -2-D echo recently performed reported as EF of 55-60% and grade 1 diastolic dysfunction with moderate concentric hypertrophy-no focal wall motion abnormality was noted -2-D echo today is unchanged from prior -EKG shows sinus rhythm with an inverted T-wave in aVL--Trended Troponin ( x 4 sets) Troponin performed in the ER was negative--Troponin trend is flat with max being 0.04- doubtful she had any acute coronary event  Acute renal failure -Prerenal versus ATN-continue to hydrate and follow-monitor for hypotension-Monitor I and O  closely - holding Avapro  Hyponatremia -Hydrate with normal saline and follow - check urine sodium and osm today   Essential hypertension -Continue  Metoprolol -Hold Avapro due to acute renal failure   Chronic diastolic heart failure -Only grade 1 diastolic dysfunction documented on echo -Not on diuretics at home  Hyperlipidemia -Continue Lipitor  Severe protein calorie malnutrition -Continue ensure  Code Status:     Code Status Orders        Start     Ordered   07/11/15 2123  Full code   Continuous     07/11/15 2122     Disposition Plan:  PT to determine if she needs to go to SNF DVT prophylaxis: lovenox Consultants: Orthopedic surgery Procedures: Bilateral carotid duplex Findings suggest 1-39% internal carotid artery stenosis bilaterally. Vertebral arteries are patent with antegrade flow.  2D ECHO Left ventricle: The cavity size was normal. Systolic function was vigorous. The estimated ejection fraction was in the range of 65% to 70%. Wall motion was normal; there were no regional wall motion abnormalities. Doppler parameters are consistent with abnormal left ventricular relaxation (grade 1 diastolic dysfunction). - Aortic valve: There was moderate regurgitation. - Mitral valve: Mildly to moderately calcified annulus. There was mild regurgitation  Antibiotics: Anti-infectives    None      Objective: Filed Weights   07/11/15 1641  Weight: 53.978 kg (119 lb)    Intake/Output Summary (Last 24 hours) at 07/13/15 0844 Last data filed at 07/13/15 0554  Gross per 24 hour  Intake    240 ml  Output    950 ml  Net   -710  ml     Vitals Filed Vitals:   07/12/15 1600 07/12/15 2049 07/13/15 0554 07/13/15 0800  BP: 132/53 146/50 163/55 159/51  Pulse: 62 78 65 67  Temp: 97.1 F (36.2 C) 98.7 F (37.1 C) 97.8 F (36.6 C) 98.2 F (36.8 C)  TempSrc: Oral Oral  Oral  Resp: 18 18 18 20   Weight:      SpO2: 100% 100% 99% 100%     Exam:  General:  Pt is alert,  oriented to person time and place but not to situation-  not in acute distress  HEENT: No icterus, No thrush, oral mucosa moist  Cardiovascular: regular rate and rhythm, S1/S2 No murmur  Respiratory: clear to auscultation bilaterally   Abdomen: Soft, +Bowel sounds, non tender, non distended, no guarding  MSK: No LE edema, cyanosis or clubbing  Data Reviewed: Basic Metabolic Panel:  Recent Labs Lab 07/11/15 1716 07/12/15 0532 07/12/15 1540 07/13/15 0624  NA 130* 129* 129* 128*  K 4.5 4.5 4.5 4.7  CL 97* 98* 98* 99*  CO2 25 23 24 24   GLUCOSE 162* 88 94 88  BUN 19 18 18 13   CREATININE 1.14* 1.15* 1.22* 1.03*  CALCIUM 9.4 9.2 8.7* 8.9   Liver Function Tests:  Recent Labs Lab 07/11/15 1716  AST 29  ALT 21  ALKPHOS 102  BILITOT 0.7  PROT 7.3  ALBUMIN 3.4*   No results for input(s): LIPASE, AMYLASE in the last 168 hours. No results for input(s): AMMONIA in the last 168 hours. CBC:  Recent Labs Lab 07/11/15 1716 07/12/15 0532 07/13/15 0624  WBC 5.3 4.3 4.2  NEUTROABS 4.2  --   --   HGB 17.5* 17.0* 15.7*  HCT 49.6* 48.1* 45.5  MCV 91.2 91.1 93.2  PLT 426* 358 367   Cardiac Enzymes:  Recent Labs Lab 07/12/15 1540 07/12/15 1848 07/13/15 0012 07/13/15 0624  TROPONINI 0.04* 0.03 0.03 0.04*   BNP (last 3 results)  Recent Labs  04/07/15 1130 05/25/15 2221  BNP 306.0* 154.7*    ProBNP (last 3 results)  Recent Labs  08/03/14 1643  PROBNP 181.0*    CBG: No results for input(s): GLUCAP in the last 168 hours.  Recent Results (from the past 240 hour(s))  Urine culture     Status: None   Collection Time: 07/11/15  6:01 PM  Result Value Ref Range Status   Specimen Description Urine  Final   Special Requests NONE  Final   Culture   Final    NO GROWTH 1 DAY Performed at [facility]    Report Status 07/12/2015 FINAL  Final     Studies: Dg Chest 2 View  07/11/2015   CLINICAL DATA:  Fall from  bed.  Right hip pain.  EXAM: CHEST  2 VIEW  COMPARISON:  05/29/2015  FINDINGS: The heart size and mediastinal contours are within normal limits. Both lungs are clear. The bones appear diffusely osteopenic. There is no acute fracture or subluxation identified.  IMPRESSION: No active cardiopulmonary disease.   Electronically Signed   By: Kerby Moors M.D.   On: 07/11/2015 17:52   Ct Head Wo Contrast  07/11/2015   CLINICAL DATA:  Pt is from home, fell out of bed, right hip pain today, left sided temporal abrasion. Denies LOC, no blood thinners. No obvious deformity. Pt usually uses a walker at home, is unsteady on her feet. Pt also had a fall on Saturday  EXAM: CT HEAD WITHOUT CONTRAST  CT CERVICAL SPINE WITHOUT  CONTRAST  TECHNIQUE: Multidetector CT imaging of the head and cervical spine was performed following the standard protocol without intravenous contrast. Multiplanar CT image reconstructions of the cervical spine were also generated.  COMPARISON:  05/25/2015  FINDINGS: CT HEAD FINDINGS  Partial opacification of right mastoid air cells as before. Atherosclerotic and physiologic intracranial calcifications. Diffuse parenchymal atrophy. Prominence of lateral ventricles as before. Patchy areas of hypoattenuation in deep and periventricular white matter bilaterally. Negative for acute intracranial hemorrhage, mass lesion, acute infarction, midline shift, or mass-effect. Acute infarct may be inapparent on noncontrast CT. Ventricles and sulci symmetric. Bone windows demonstrate no focal lesion.  CT CERVICAL SPINE FINDINGS  Normal alignment. No prevertebral soft tissue swelling. Facets are seated. Mild narrowing of the C2-3 and C5-6 interspaces. Fairly exuberant degenerative change at the atlantoaxial articulation. Anterior and posterior endplate spurring T1-X7. Negative for fracture. Bilateral carotid calcified plaque. Scarring in the visualized lung apices. Calcification in the apical segment left upper lobe,  incompletely visualized.  IMPRESSION: 1. Negative for bleed or other acute intracranial process. 2. Atrophy and nonspecific white matter changes as before. 3. Negative for cervical fracture or other acute bone abnormality. 4. Multilevel cervical degenerative changes as above.   Electronically Signed   By: Lucrezia Europe M.D.   On: 07/11/2015 18:01   Ct Cervical Spine Wo Contrast  07/11/2015   CLINICAL DATA:  Pt is from home, fell out of bed, right hip pain today, left sided temporal abrasion. Denies LOC, no blood thinners. No obvious deformity. Pt usually uses a walker at home, is unsteady on her feet. Pt also had a fall on Saturday  EXAM: CT HEAD WITHOUT CONTRAST  CT CERVICAL SPINE WITHOUT CONTRAST  TECHNIQUE: Multidetector CT imaging of the head and cervical spine was performed following the standard protocol without intravenous contrast. Multiplanar CT image reconstructions of the cervical spine were also generated.  COMPARISON:  05/25/2015  FINDINGS: CT HEAD FINDINGS  Partial opacification of right mastoid air cells as before. Atherosclerotic and physiologic intracranial calcifications. Diffuse parenchymal atrophy. Prominence of lateral ventricles as before. Patchy areas of hypoattenuation in deep and periventricular white matter bilaterally. Negative for acute intracranial hemorrhage, mass lesion, acute infarction, midline shift, or mass-effect. Acute infarct may be inapparent on noncontrast CT. Ventricles and sulci symmetric. Bone windows demonstrate no focal lesion.  CT CERVICAL SPINE FINDINGS  Normal alignment. No prevertebral soft tissue swelling. Facets are seated. Mild narrowing of the C2-3 and C5-6 interspaces. Fairly exuberant degenerative change at the atlantoaxial articulation. Anterior and posterior endplate spurring W6-O0. Negative for fracture. Bilateral carotid calcified plaque. Scarring in the visualized lung apices. Calcification in the apical segment left upper lobe, incompletely visualized.   IMPRESSION: 1. Negative for bleed or other acute intracranial process. 2. Atrophy and nonspecific white matter changes as before. 3. Negative for cervical fracture or other acute bone abnormality. 4. Multilevel cervical degenerative changes as above.   Electronically Signed   By: Lucrezia Europe M.D.   On: 07/11/2015 18:01   Dg Hip Unilat With Pelvis 2-3 Views Right  07/11/2015   CLINICAL DATA:  Right hip pain  EXAM: DG HIP (WITH OR WITHOUT PELVIS) 2-3V RIGHT  COMPARISON:  05/26/2015  FINDINGS: There is foreshortening of the right femoral neck which is suspicious for a subcapital femoral neck fracture. There is advanced osteoarthritis involving the right hip joint with near bone-on-bone contact. Marginal spur formation is noted. No dislocation.  IMPRESSION: 1. Findings are worrisome for impacted subcapital femoral neck fracture involving the right  hip. Recommend confirmatory imaging with right hip CT or MRI. 2. Osteopenia. 3. Degenerative joint disease.   Electronically Signed   By: Kerby Moors M.D.   On: 07/11/2015 17:49    Scheduled Meds:  Scheduled Meds: . [START ON 07/17/2015] atorvastatin  80 mg Oral Q Mon-1800  . calcium-vitamin D  1 tablet Oral BID  . docusate sodium  100 mg Oral BID  . feeding supplement (ENSURE ENLIVE)  237 mL Oral BID BM  . metoprolol tartrate  50 mg Oral BID  . multivitamin with minerals  1 tablet Oral Daily  . pantoprazole  40 mg Oral Q1200   Continuous Infusions: . sodium chloride Stopped (07/12/15 1150)  . sodium chloride 100 mL/hr at 07/13/15 0248    Time spent on care of this patient: 35 min   [place], MD 07/13/2015, 8:44 AM  LOS: 2 days   [facility] Office  [phone] Pager - Text Page per [email or web address] If 7PM-7AM, please contact night-coverage [email or web address]

## 2015-07-14 ENCOUNTER — Ambulatory Visit: Payer: Medicare Other | Admitting: Family Medicine

## 2015-07-14 LAB — BASIC METABOLIC PANEL
Anion gap: 4 — ABNORMAL LOW (ref 5–15)
Anion gap: 3 — ABNORMAL LOW (ref 5–15)
BUN: 9 mg/dL (ref 6–20)
BUN: 8 mg/dL (ref 6–20)
CHLORIDE: 97 mmol/L — AB (ref 101–111)
CHLORIDE: 100 mmol/L — AB (ref 101–111)
CO2: 25 mmol/L (ref 22–32)
CO2: 25 mmol/L (ref 22–32)
CREATININE: 0.82 mg/dL (ref 0.44–1.00)
Calcium: 8.7 mg/dL — ABNORMAL LOW (ref 8.9–10.3)
Calcium: 8.9 mg/dL (ref 8.9–10.3)
Creatinine, Ser: 0.81 mg/dL (ref 0.44–1.00)
GFR calc Af Amer: >60 mL/min (ref >60)
GFR calc non Af Amer: 60 mL/min — ABNORMAL LOW (ref >60)
GFR calc non Af Amer: >60 mL/min (ref >60)
GLUCOSE: 105 mg/dL — AB (ref 65–99)
Glucose, Bld: 100 mg/dL — ABNORMAL HIGH (ref 65–99)
POTASSIUM: 4.5 mmol/L (ref 3.5–5.1)
Potassium: 4.4 mmol/L (ref 3.5–5.1)
Sodium: 126 mmol/L — ABNORMAL LOW (ref 135–145)
Sodium: 128 mmol/L — ABNORMAL LOW (ref 135–145)

## 2015-07-14 LAB — CBC
HCT: 44.5 % (ref 36.0–46.0)
HEMOGLOBIN: 15.2 g/dL — AB (ref 12.0–15.0)
MCH: 32.1 pg (ref 26.0–34.0)
MCHC: 34.2 g/dL (ref 30.0–36.0)
MCV: 93.9 fL (ref 78.0–100.0)
PLATELETS: 325 10*3/uL (ref 150–400)
RBC: 4.74 MIL/uL (ref 3.87–5.11)
RDW: 17.7 % — ABNORMAL HIGH (ref 11.5–15.5)
WBC: 4.2 10*3/uL (ref 4.0–10.5)

## 2015-07-14 MED ORDER — WHITE PETROLATUM GEL
Status: AC
  Administered 2015-07-14: 0.2
  Filled 2015-07-14: qty 1

## 2015-07-14 NOTE — Care Management Important Message (Signed)
Important Message  Patient Details  Name: Becky Morales MRN: [id number] Date of Birth: April 29, 1920   Medicare Important Message Given:  Yes-second notification given    Loann Quill 07/14/2015, 11:13 AM

## 2015-07-14 NOTE — Evaluation (Signed)
Occupational Therapy Evaluation Patient Details Name: Becky Morales MRN: [id number] DOB: [birth year] Today's Date: 07/14/2015    History of Present Illness Pt is is a 81 y.o. female with PMH of hypertension, hyperlipidemia, GERD, CAD (S/B stent placement to 2012), carcinoid tumor (not know the detail), diastolic congestive heart failure, who presents with increased urinary frequency and altered mental status.   Clinical Impression   PTA, pt lived alone with supervision provided by a cousin only at night. It does not seem that anyone is available to provide 24 hour supervision to pt if she were to return home. Currently pt required mod A +2 overall for bed mobility. Attempted to perform sit <> stand transfer x 2 with max A, unsuccessful in coming to stand from sitting EOB. Recommending d/c to SNF with 24 hour supervision for safety.     Follow Up Recommendations  SNF;Supervision/Assistance - 24 hour    Equipment Recommendations  Other (comment) (defer to next venue of care )    Recommendations for Other Services       Precautions / Restrictions Precautions Precautions: Fall Restrictions Weight Bearing Restrictions: Yes RLE Weight Bearing: Weight bearing as tolerated      Mobility Bed Mobility Overal bed mobility: Needs Assistance;+2 for physical assistance Bed Mobility: Supine to Sit;Sit to Supine     Supine to sit: Min assist;Mod assist;+2 for physical assistance Sit to supine: Max assist;+2 for physical assistance   General bed mobility comments: Pt required increased time for bed mobility and verbal cues   Transfers Overall transfer level: Needs assistance Equipment used: Rolling walker (2 wheeled);2 person hand held assist Transfers: Sit to/from Stand Sit to Stand: Max assist;+2 physical assistance         General transfer comment: Pt unable to come to stand from sitting EOB. Attempted x 2    Balance Overall balance assessment: Needs assistance                                          ADL Overall ADL's : Needs assistance/impaired                                       General ADL Comments: Pt is currently max A for all ADLs.      Vision     Perception     Praxis      Pertinent Vitals/Pain Pain Assessment: Faces Faces Pain Scale: Hurts whole lot Pain Location: B LE. Pt reports pain and discomfort switches between both legs Pain Descriptors / Indicators: Grimacing;Guarding Pain Intervention(s): Limited activity within patient's tolerance;Monitored during session;Repositioned     Hand Dominance     Extremity/Trunk Assessment Upper Extremity Assessment Upper Extremity Assessment: Generalized weakness   Lower Extremity Assessment Lower Extremity Assessment: Defer to PT evaluation       Communication Communication Communication: No difficulties   Cognition Arousal/Alertness: Awake/alert Behavior During Therapy: WFL for tasks assessed/performed Overall Cognitive Status: Impaired/Different from baseline Area of Impairment: Awareness               General Comments: Cousin reports that he notices pt with decreased cognition. Cousin cant identify when decline occured    General Comments       Exercises       Shoulder Instructions      Home Living Family/patient  expects to be discharged to:: Skilled nursing facility                                        Prior Functioning/Environment Level of Independence: Needs assistance    ADL's / Homemaking Assistance Needed: Difficult to assess pts prior level of function with ADLs. Pt reports cousin stays with her at night time but unclear about assistance and if anyone is home to supervise during the day        OT Diagnosis: Generalized weakness;Acute pain   OT Problem List:     OT Treatment/Interventions:      OT Goals(Current goals can be found in the care plan section) Acute Rehab OT Goals Patient Stated Goal: none  stated  OT Frequency:     Barriers to D/C:            Co-evaluation              End of Session Equipment Utilized During Treatment: Gait belt;Rolling walker  Activity Tolerance: Patient limited by pain Patient left: in bed;with call bell/phone within reach;with family/visitor present;with SCD's reapplied   Time: 1610-9604 OT Time Calculation (min): 21 min Charges:  OT General Charges $OT Visit: 1 Procedure OT Evaluation $Initial OT Evaluation Tier I: 1 Procedure G-Codes:     Binnie Kand M.S., OTR/L Pager: [phone]  07/14/2015, 3:04 PM

## 2015-07-14 NOTE — Progress Notes (Addendum)
[facility] Progress Note   Becky Morales  HGD:[id number]  DOB: [birth year]  DOA: 07/11/2015 PCP: Garret Reddish, MD  Brief narrative: Becky Morales is a 81 y.o. female history of hypertension, hyperlipidemia, coronary artery disease with bare metal stent in 2012. Who presents to the hospital via EMS after a fall with a complaint of right hip pain. The patient stated in the ER that she passed out twice. On the day of my exam (the following day), she appeared confused and was unable to tell me if she passed out or not.    Subjective: Alert, complains of pain in both lower legs today. No pain in hips. Very anxious when I discuss trying to get up out of bed and is refusing to get up. No complaints of nausea vomiting diarrhea shortness of breath or chest pain  Assessment/Plan: Principal Problem: Right hip pain -Initial x-ray suggestive of an impacted subcapital femoral neck fracture - hip CT negative for fracture and actually shows that patient has severe osteoarthritis - pain control- PT eval  Active Problems: Syncope? Fall? -Difficult to tell how oriented the patient is-she gave me no history of syncope and actually was not able to remember the days events that led up to admission-spoken with her cousin, Renea Ee who states that the patient lives alone and has occasional paranoid thoughts and may be developing some dementia -CT of the head without contrast negative for bleed or acute intracranial process -CT C-spine negative for acute fracture -Carotid duplex does not reveal any carotid artery stenosis -2-D echo recently performed reported as EF of 55-60% and grade 1 diastolic dysfunction with moderate concentric hypertrophy-no focal wall motion abnormality was noted -2-D echo today is unchanged from prior -EKG shows sinus rhythm with an inverted T-wave in aVL--Trended Troponin ( x 4 sets) Troponin performed in the ER was negative--Troponin trend is flat with max being 0.04-  doubtful she had any acute coronary event  Acute renal failure -Resolved with hydration- we'll stop IV fluids today - holding Avapro  Hyponatremia -Sodium noted to drop despite IV hydration -urine sodium is elevated and therefore at this time she is no longer dehydrated  -Urine osmolality low normal -We'll stop IV fluids and monitor sodium   Essential hypertension -Continue  Metoprolol -Avapro has been on hold due to acute renal failure -can resume tomorrow if renal function continues to be stable   Chronic diastolic heart failure -Only grade 1 diastolic dysfunction documented on echo -Not on diuretics at home  Hyperlipidemia -Continue Lipitor  Severe protein calorie malnutrition -Continue ensure  Code Status:     Code Status Orders        Start     Ordered   07/11/15 2123  Full code   Continuous     07/11/15 2122     Disposition Plan:  PT to determine if she needs to go to SNF DVT prophylaxis: lovenox Consultants: Orthopedic surgery Procedures: Bilateral carotid duplex Findings suggest 1-39% internal carotid artery stenosis bilaterally. Vertebral arteries are patent with antegrade flow.  2D ECHO Left ventricle: The cavity size was normal. Systolic function was vigorous. The estimated ejection fraction was in the range of 65% to 70%. Wall motion was normal; there were no regional wall motion abnormalities. Doppler parameters are consistent with abnormal left ventricular relaxation (grade 1 diastolic dysfunction). - Aortic valve: There was moderate regurgitation. - Mitral valve: Mildly to moderately calcified annulus. There was mild regurgitation  Antibiotics: Anti-infectives    None  Objective: Filed Weights   07/11/15 1641  Weight: 53.978 kg (119 lb)    Intake/Output Summary (Last 24 hours) at 07/14/15 1207 Last data filed at 07/14/15 0536  Gross per 24 hour  Intake 1120.17 ml  Output   1100 ml  Net  20.17 ml      Vitals Filed Vitals:   07/13/15 1006 07/13/15 1600 07/13/15 2104 07/14/15 0537  BP: 152/52 150/52 156/49 160/61  Pulse: 63 65 68 66  Temp: 98 F (36.7 C) 98.1 F (36.7 C) 99.3 F (37.4 C) 98.8 F (37.1 C)  TempSrc: Oral Oral Oral Oral  Resp: 18 20  16   Weight:      SpO2: 100% 100% 100% 100%    Exam:  General:  Pt is alert,  oriented to person time and place but not to situation-  not in acute distress  HEENT: No icterus, No thrush, oral mucosa moist  Cardiovascular: regular rate and rhythm, S1/S2 No murmur  Respiratory: clear to auscultation bilaterally   Abdomen: Soft, +Bowel sounds, non tender, non distended, no guarding  MSK: No LE edema, cyanosis or clubbing  Data Reviewed: Basic Metabolic Panel:  Recent Labs Lab 07/11/15 1716 07/12/15 0532 07/12/15 1540 07/13/15 0624 07/14/15 0710  NA 130* 129* 129* 128* 126*  K 4.5 4.5 4.5 4.7 4.4  CL 97* 98* 98* 99* 97*  CO2 25 23 24 24 25   GLUCOSE 162* 88 94 88 100*  BUN 19 18 18 13 9   CREATININE 1.14* 1.15* 1.22* 1.03* 0.82  CALCIUM 9.4 9.2 8.7* 8.9 8.7*   Liver Function Tests:  Recent Labs Lab 07/11/15 1716  AST 29  ALT 21  ALKPHOS 102  BILITOT 0.7  PROT 7.3  ALBUMIN 3.4*   No results for input(s): LIPASE, AMYLASE in the last 168 hours. No results for input(s): AMMONIA in the last 168 hours. CBC:  Recent Labs Lab 07/11/15 1716 07/12/15 0532 07/13/15 0624 07/14/15 0710  WBC 5.3 4.3 4.2 4.2  NEUTROABS 4.2  --   --   --   HGB 17.5* 17.0* 15.7* 15.2*  HCT 49.6* 48.1* 45.5 44.5  MCV 91.2 91.1 93.2 93.9  PLT 426* 358 367 325   Cardiac Enzymes:  Recent Labs Lab 07/12/15 1540 07/12/15 1848 07/13/15 0012 07/13/15 0624  TROPONINI 0.04* 0.03 0.03 0.04*   BNP (last 3 results)  Recent Labs  04/07/15 1130 05/25/15 2221  BNP 306.0* 154.7*    ProBNP (last 3 results)  Recent Labs  08/03/14 1643  PROBNP 181.0*    CBG: No results for input(s): GLUCAP in the last 168 hours.  Recent  Results (from the past 240 hour(s))  Urine culture     Status: None   Collection Time: 07/11/15  6:01 PM  Result Value Ref Range Status   Specimen Description Urine  Final   Special Requests NONE  Final   Culture   Final    NO GROWTH 1 DAY Performed at [facility]    Report Status 07/12/2015 FINAL  Final     Studies: Ct Hip Right Wo Contrast  07/13/2015   CLINICAL DATA:  History of carcinoid tumor. Evaluate for acute versus chronic right hip fracture. Right hip pain.  EXAM: CT OF THE RIGHT HIP WITHOUT CONTRAST  TECHNIQUE: Multidetector CT imaging of the right hip was performed according to the standard protocol. Multiplanar CT image reconstructions were also generated.  COMPARISON:  07/02/2006.  FINDINGS: There is generalized osteopenia. There is no acute fracture or dislocation. There  is severe osteoarthritis of the right hip with severe joint space narrowing with a bone-on-bone appearance and marginal osteophytosis. There are mild degenerative changes of the right SI joint.  There is trabecular thickening involving the right hemipelvis with mild bony expansion most consistent with Paget's disease.  There is a chronic 7.2 x 2.6 cm complex fluid collection in the subcutaneous fat overlying the right gluteus maximus muscle unchanged compared with 07/02/2006. There is no other fluid collection. The muscles are normal. There is no inguinal lymphadenopathy. There is no inguinal hernia. There is diverticulosis without evidence diverticulitis of the visualized sigmoid colon.  IMPRESSION: 1. No acute osseous injury of the right hip. 2. Severe osteoarthritis of the right hip. 3. Paget's disease of the right hemipelvis. 4. Chronic 7.2 x 2.6 cm complex fluid collection in the subcutaneous fat overlying the right gluteus maximus muscle unchanged compared with 07/02/2006. This likely represents sequela prior trauma.   Electronically Signed   By: Kathreen Devoid   On: 07/13/2015 09:49    Scheduled  Meds:  Scheduled Meds: . [START ON 07/17/2015] atorvastatin  80 mg Oral Q Mon-1800  . calcium-vitamin D  1 tablet Oral BID  . docusate sodium  100 mg Oral BID  . enoxaparin (LOVENOX) injection  30 mg Subcutaneous Q24H  . feeding supplement (ENSURE ENLIVE)  237 mL Oral BID BM  . metoprolol tartrate  50 mg Oral BID  . multivitamin with minerals  1 tablet Oral Daily  . pantoprazole  40 mg Oral Q1200   Continuous Infusions: . sodium chloride Stopped (07/12/15 1150)    Time spent on care of this patient: 39 min   [place], MD 07/14/2015, 12:07 PM  LOS: 3 days   [facility] Office  [phone] Pager - Text Page per [email or web address] If 7PM-7AM, please contact night-coverage [email or web address]

## 2015-07-14 NOTE — Progress Notes (Signed)
No change in the patient overnight.  Refusing surgical intervention at this time. Also refusing PT/OT eval.  Consult to palliative care made.  Will sign off at this time.  Patient can continue to follow up in our office as outpatient.

## 2015-07-14 NOTE — Evaluation (Signed)
Physical Therapy Evaluation Patient Details Name: Becky Morales MRN: [id number] DOB: 08/12/1920 Today's Date: 07/14/2015   History of Present Illness  Pt is is a 81 y.o. female with PMH of hypertension, hyperlipidemia, GERD, CAD (S/B stent placement to 2012), carcinoid tumor (not know the detail), diastolic congestive heart failure, who presents with increased urinary frequency and altered mental status.  Clinical Impression  Patient is s/p above resulting in functional limitations due to the deficits listed below (see PT Problem List). Patient will benefit from skilled PT to increase their independence and safety with mobility to allow discharge to the venue listed below.       Follow Up Recommendations SNF;Supervision/Assistance - 24 hour    Equipment Recommendations  Other (comment) (to be determined)    Recommendations for Other Services       Precautions / Restrictions Precautions Precautions: Fall Restrictions Weight Bearing Restrictions: Yes RLE Weight Bearing: Weight bearing as tolerated      Mobility  Bed Mobility Overal bed mobility: Needs Assistance;+2 for physical assistance Bed Mobility: Supine to Sit;Sit to Supine     Supine to sit: Min assist;Mod assist;+2 for physical assistance Sit to supine: Max assist;+2 for physical assistance (trunk and LEs)   General bed mobility comments: Patient able to assist with supine to sitting with patient requesting that we "give her time" Unable to perform bed mobility without assistance.   Transfers Overall transfer level: Needs assistance Equipment used: Rolling walker (2 wheeled) Transfers: Sit to/from Stand Sit to Stand: Max assist;Total assist         General transfer comment: Two attempts made to stand, unable to achieve standing despite max-total assistance of 2  Ambulation/Gait                Stairs            Wheelchair Mobility    Modified Rankin (Stroke Patients Only)       Balance  Overall balance assessment: Needs assistance Sitting-balance support: Bilateral upper extremity supported Sitting balance-Leahy Scale: Poor                                       Pertinent Vitals/Pain Pain Assessment: Faces Faces Pain Scale: Hurts even more Pain Location: Bilateral LEs Pain Descriptors / Indicators: Grimacing;Guarding Pain Intervention(s): Monitored during session;Limited activity within patient's tolerance;Repositioned    Home Living Family/patient expects to be discharged to:: Unsure                      Prior Function Level of Independence: Needs assistance     Comments: Patient's cousin reporting that the patient did live with another cousin but she was ambulatory. He stated that he had noticed occasional confusion in the past but not like she is currently.      Hand Dominance        Extremity/Trunk Assessment      Lower Extremity Assessment: Generalized weakness         Communication   Communication: No difficulties  Cognition Arousal/Alertness: Awake/alert Behavior During Therapy: WFL for tasks assessed/performed Overall Cognitive Status: Impaired/Different from baseline Area of Impairment: Awareness               General Comments: Cousin present, reports current confusion is different from baseline    General Comments   Exercises        Assessment/Plan    PT Assessment  Patient needs continued PT services  PT Diagnosis Difficulty walking;Generalized weakness;Acute pain   PT Problem List Decreased strength;Decreased range of motion;Decreased activity tolerance;Decreased balance;Decreased mobility;Decreased knowledge of use of DME;Decreased safety awareness;Pain  PT Treatment Interventions DME instruction;Gait training;Stair training;Functional mobility training;Therapeutic activities;Therapeutic exercise;Balance training;Patient/family education   PT Goals (Current goals can be found in the Care Plan  section) Acute Rehab PT Goals Patient Stated Goal: none stated    Frequency Min 3X/week   Barriers to discharge        Co-evaluation     PT goals addressed during session: Mobility/safety with mobility         End of Session Equipment Utilized During Treatment: Gait belt Activity Tolerance: Patient tolerated treatment well Patient left: in bed;with call bell/phone within reach;with family/visitor present Nurse Communication: Mobility status;Need for lift equipment         Time: 4967-5916 PT Time Calculation (min) (ACUTE ONLY): 23 min   Charges:   PT Evaluation $Initial PT Evaluation Tier I: 1 Procedure     PT G Codes:        Cassell Clement, PT, CSCS Pager [phone] Office [phone]  07/14/2015, 3:52 PM

## 2015-07-14 NOTE — Discharge Instructions (Signed)
Weight bearing as tolerated in the R leg

## 2015-07-15 DIAGNOSIS — M25551 Pain in right hip: Principal | ICD-10-CM

## 2015-07-15 DIAGNOSIS — R55 Syncope and collapse: Secondary | ICD-10-CM

## 2015-07-15 DIAGNOSIS — E871 Hypo-osmolality and hyponatremia: Secondary | ICD-10-CM

## 2015-07-15 LAB — BASIC METABOLIC PANEL
ANION GAP: 7 (ref 5–15)
BUN: 7 mg/dL (ref 6–20)
CALCIUM: 9.5 mg/dL (ref 8.9–10.3)
CO2: 29 mmol/L (ref 22–32)
CREATININE: 0.84 mg/dL (ref 0.44–1.00)
Chloride: 94 mmol/L — ABNORMAL LOW (ref 101–111)
GFR, EST NON AFRICAN AMERICAN: 58 mL/min — AB (ref >60)
Glucose, Bld: 95 mg/dL (ref 65–99)
Potassium: 4.5 mmol/L (ref 3.5–5.1)
SODIUM: 130 mmol/L — AB (ref 135–145)

## 2015-07-15 MED ORDER — BISACODYL 10 MG RE SUPP: 10.0000 mg | Freq: Every day | RECTAL | Status: AC | PRN

## 2015-07-15 MED ORDER — IRBESARTAN 300 MG PO TABS
300.0000 mg | ORAL_TABLET | Freq: Every day | ORAL | Status: DC
  Administered 2015-07-15: 300 mg via ORAL
  Filled 2015-07-15: qty 1

## 2015-07-15 MED ORDER — SENNOSIDES-DOCUSATE SODIUM 8.6-50 MG PO TABS
1.0000 | ORAL_TABLET | Freq: Every evening | ORAL | Status: DC | PRN
Start: 2015-07-15 — End: 2016-08-01

## 2015-07-15 MED ORDER — DOCUSATE SODIUM 100 MG PO CAPS: 100.0000 mg | ORAL_CAPSULE | Freq: Two times a day (BID) | ORAL | Status: DC

## 2015-07-15 MED ORDER — HYDROCODONE-ACETAMINOPHEN 5-325 MG PO TABS: 1.0000 | ORAL_TABLET | Freq: Four times a day (QID) | ORAL | Status: DC | PRN

## 2015-07-15 NOTE — Progress Notes (Signed)
Called and gave report to Mariam at [facility] at this time.  Informed Mariam patient's foley catheter was pulled around 9:30am and currently has not voided.  Mariam aware and verbalzied understanding.

## 2015-07-15 NOTE — Discharge Summary (Addendum)
Physician Discharge Summary  Becky Morales ZOX:[id number] DOB: [birth year] DOA: 07/11/2015  PCP: Garret Reddish, MD  Admit date: 07/11/2015 Discharge date: 07/15/2015  Time spent: 60 minutes  Recommendations for Outpatient Follow-up:  1. Recommend she have 24 hr supervision at home or go to assisted living facility post rehab 2. Bmet in 3-4 days to assess urine sodium and renal function  Discharge Condition: stable Diet recommendation: regular diet   Discharge Diagnoses:  Principal Problem:   Right hip pain Active Problems:   Essential hypertension   Chronic diastolic heart failure   HLD (hyperlipidemia)   Protein-calorie malnutrition, severe Hyponatremia  History of present illness:  Becky Morales is a 81 y.o. female history of hypertension, hyperlipidemia, coronary artery disease with bare metal stent in 2012. Who presents to the hospital via EMS after a fall with a complaint of right hip pain. The patient stated in the ER that she passed out twice. On the day of my exam (the following day), she appeared confused and was unable to tell me if she passed out or not.    Hospital Course:  Principal Problem: Right hip pain -Initial x-ray suggestive of an impacted subcapital femoral neck fracture which is why she was admitted - hip CT negative for fracture and actually shows that patient has severe osteoarthritis - pain controlled- currently pain is in b/l knees and not in hip - PT eval completed- SNF recommended- she recently had a stay at [place] - I would be hesitant to allow her to go back to home to live alone  Active Problems: Syncope? Fall? -Difficult to tell how oriented the patient is-she gave me no history of syncope and actually was not able to remember the days events that led up to admission-spoken with her cousin, Renea Ee who states that the patient lives alone and has occasional paranoid thoughts and may be developing some dementia -CT of the  head without contrast negative for bleed or acute intracranial process -CT C-spine negative for acute fracture -Carotid duplex does not reveal any carotid artery stenosis -2-D echo recently performed reported as EF of 55-60% and grade 1 diastolic dysfunction with moderate concentric hypertrophy-no focal wall motion abnormality was noted -2-D echo today is unchanged from prior -EKG shows sinus rhythm with an inverted T-wave in aVL--Trended Troponin ( x 4 sets) Troponin performed in the ER was negative--Troponin trend is flat with max being 0.04- doubtful she had any acute coronary event  Acute renal failure - likely prerenal -Resolved with hydration with IV fluids -temporarily held Avapro- resuming today  Hyponatremia -Sodium noted to drop despite IV hydration -urine sodium check on 9/22 was elevated  -Urine osmolality low normal -  IV fluids discontinued on 9/23- spdium today 130 - recommend checking Bmet for renal function and Na+ in 3-4 days  Essential hypertension -Continue Metoprolol -Avapro has been on hold due to acute renal failure -can resume tomorrow if renal function continues to be stable  Chronic diastolic heart failure -Only grade 1 diastolic dysfunction documented on echo -Not on diuretics at home  Hyperlipidemia -Continue Lipitor  Severe protein calorie malnutrition -Continue ensure  Consultations:  Orthopedic- Fredonia Highland, MD  Discharge Exam: Filed Weights   07/11/15 1641  Weight: 53.978 kg (119 lb)   Filed Vitals:   07/15/15 1005  BP: 170/46  Pulse: 61  Temp:   Resp: 16    General: AAO x 3, no distress Cardiovascular: RRR, no murmurs  Respiratory: clear to auscultation bilaterally GI:  soft, non-tender, non-distended, bowel sound positive  Discharge Instructions You were cared for by a hospitalist during your hospital stay. If you have any questions about your discharge medications or the care you received while you were in the hospital after  you are discharged, you can call the unit and asked to speak with the hospitalist on call if the hospitalist that took care of you is not available. Once you are discharged, your primary care physician will handle any further medical issues. Please note that NO REFILLS for any discharge medications will be authorized once you are discharged, as it is imperative that you return to your primary care physician (or establish a relationship with a primary care physician if you do not have one) for your aftercare needs so that they can reassess your need for medications and monitor your lab values.      Discharge Instructions    Diet - low sodium heart healthy    Complete by:  As directed      Increase activity slowly    Complete by:  As directed             Medication List    STOP taking these medications        GOODYS BODY PAIN PO      TAKE these medications        acetaminophen 325 MG tablet  Commonly known as:  TYLENOL  Take 2 tablets (650 mg total) by mouth every 6 (six) hours as needed for mild pain (or Fever >/= 101).     aspirin 81 MG tablet  Take 81 mg by mouth daily.     atorvastatin 80 MG tablet  Commonly known as:  LIPITOR  Take 80 mg by mouth once a week. Take on Mondays     bisacodyl 10 MG suppository  Commonly known as:  DULCOLAX  Place 1 suppository (10 mg total) rectally daily as needed for moderate constipation.     calcium-vitamin D 500-200 MG-UNIT per tablet  Commonly known as:  OSCAL WITH D  Take 1 tablet by mouth 2 (two) times daily.     docusate sodium 100 MG capsule  Commonly known as:  COLACE  Take 1 capsule (100 mg total) by mouth 2 (two) times daily.     feeding supplement (ENSURE ENLIVE) Liqd  Take 237 mLs by mouth 2 (two) times daily between meals.     HYDROcodone-acetaminophen 5-325 MG per tablet  Commonly known as:  NORCO/VICODIN  Take 1 tablet by mouth every 6 (six) hours as needed for moderate pain.     metoprolol 50 MG tablet  Commonly  known as:  LOPRESSOR  Take 1 tablet (50 mg total) by mouth 2 (two) times daily.     MULTI VITAMIN DAILY Tabs  Take 1 tablet by mouth daily. Pt. Not sure of dose.     nitroGLYCERIN 0.4 MG SL tablet  Commonly known as:  NITROSTAT  Place 1 tablet (0.4 mg total) under the tongue every 5 (five) minutes as needed for chest pain.     pantoprazole 40 MG tablet  Commonly known as:  PROTONIX  Take 1 tablet (40 mg total) by mouth daily.     senna-docusate 8.6-50 MG per tablet  Commonly known as:  Senokot-S  Take 1 tablet by mouth at bedtime as needed for mild constipation.     valsartan 320 MG tablet  Commonly known as:  DIOVAN  Take 1 tablet (320 mg total) by mouth daily.  Allergies  Allergen Reactions  . Ciprofloxacin     emesis  . Paxil [Paroxetine Hcl] Nausea And Vomiting   Follow-up Information    Follow up with MURPHY, TIMOTHY D, MD In 2 weeks.   Specialty:  Orthopedic Surgery   Contact information:   [address] [place] [address] [phone]        The results of significant diagnostics from this hospitalization (including imaging, microbiology, ancillary and laboratory) are listed below for reference.    Significant Diagnostic Studies: Dg Chest 2 View  07/11/2015   CLINICAL DATA:  Fall from bed.  Right hip pain.  EXAM: CHEST  2 VIEW  COMPARISON:  05/29/2015  FINDINGS: The heart size and mediastinal contours are within normal limits. Both lungs are clear. The bones appear diffusely osteopenic. There is no acute fracture or subluxation identified.  IMPRESSION: No active cardiopulmonary disease.   Electronically Signed   By: Kerby Moors M.D.   On: 07/11/2015 17:52   Ct Head Wo Contrast  07/11/2015   CLINICAL DATA:  Pt is from home, fell out of bed, right hip pain today, left sided temporal abrasion. Denies LOC, no blood thinners. No obvious deformity. Pt usually uses a walker at home, is unsteady on her feet. Pt also had a fall on Saturday   EXAM: CT HEAD WITHOUT CONTRAST  CT CERVICAL SPINE WITHOUT CONTRAST  TECHNIQUE: Multidetector CT imaging of the head and cervical spine was performed following the standard protocol without intravenous contrast. Multiplanar CT image reconstructions of the cervical spine were also generated.  COMPARISON:  05/25/2015  FINDINGS: CT HEAD FINDINGS  Partial opacification of right mastoid air cells as before. Atherosclerotic and physiologic intracranial calcifications. Diffuse parenchymal atrophy. Prominence of lateral ventricles as before. Patchy areas of hypoattenuation in deep and periventricular white matter bilaterally. Negative for acute intracranial hemorrhage, mass lesion, acute infarction, midline shift, or mass-effect. Acute infarct may be inapparent on noncontrast CT. Ventricles and sulci symmetric. Bone windows demonstrate no focal lesion.  CT CERVICAL SPINE FINDINGS  Normal alignment. No prevertebral soft tissue swelling. Facets are seated. Mild narrowing of the C2-3 and C5-6 interspaces. Fairly exuberant degenerative change at the atlantoaxial articulation. Anterior and posterior endplate spurring L4-J1. Negative for fracture. Bilateral carotid calcified plaque. Scarring in the visualized lung apices. Calcification in the apical segment left upper lobe, incompletely visualized.  IMPRESSION: 1. Negative for bleed or other acute intracranial process. 2. Atrophy and nonspecific white matter changes as before. 3. Negative for cervical fracture or other acute bone abnormality. 4. Multilevel cervical degenerative changes as above.   Electronically Signed   By: Lucrezia Europe M.D.   On: 07/11/2015 18:01   Ct Cervical Spine Wo Contrast  07/11/2015   CLINICAL DATA:  Pt is from home, fell out of bed, right hip pain today, left sided temporal abrasion. Denies LOC, no blood thinners. No obvious deformity. Pt usually uses a walker at home, is unsteady on her feet. Pt also had a fall on Saturday  EXAM: CT HEAD WITHOUT  CONTRAST  CT CERVICAL SPINE WITHOUT CONTRAST  TECHNIQUE: Multidetector CT imaging of the head and cervical spine was performed following the standard protocol without intravenous contrast. Multiplanar CT image reconstructions of the cervical spine were also generated.  COMPARISON:  05/25/2015  FINDINGS: CT HEAD FINDINGS  Partial opacification of right mastoid air cells as before. Atherosclerotic and physiologic intracranial calcifications. Diffuse parenchymal atrophy. Prominence of lateral ventricles as before. Patchy areas of hypoattenuation in deep and  periventricular white matter bilaterally. Negative for acute intracranial hemorrhage, mass lesion, acute infarction, midline shift, or mass-effect. Acute infarct may be inapparent on noncontrast CT. Ventricles and sulci symmetric. Bone windows demonstrate no focal lesion.  CT CERVICAL SPINE FINDINGS  Normal alignment. No prevertebral soft tissue swelling. Facets are seated. Mild narrowing of the C2-3 and C5-6 interspaces. Fairly exuberant degenerative change at the atlantoaxial articulation. Anterior and posterior endplate spurring W4-R1. Negative for fracture. Bilateral carotid calcified plaque. Scarring in the visualized lung apices. Calcification in the apical segment left upper lobe, incompletely visualized.  IMPRESSION: 1. Negative for bleed or other acute intracranial process. 2. Atrophy and nonspecific white matter changes as before. 3. Negative for cervical fracture or other acute bone abnormality. 4. Multilevel cervical degenerative changes as above.   Electronically Signed   By: Lucrezia Europe M.D.   On: 07/11/2015 18:01   Ct Hip Right Wo Contrast  07/13/2015   CLINICAL DATA:  History of carcinoid tumor. Evaluate for acute versus chronic right hip fracture. Right hip pain.  EXAM: CT OF THE RIGHT HIP WITHOUT CONTRAST  TECHNIQUE: Multidetector CT imaging of the right hip was performed according to the standard protocol. Multiplanar CT image reconstructions  were also generated.  COMPARISON:  07/02/2006.  FINDINGS: There is generalized osteopenia. There is no acute fracture or dislocation. There is severe osteoarthritis of the right hip with severe joint space narrowing with a bone-on-bone appearance and marginal osteophytosis. There are mild degenerative changes of the right SI joint.  There is trabecular thickening involving the right hemipelvis with mild bony expansion most consistent with Paget's disease.  There is a chronic 7.2 x 2.6 cm complex fluid collection in the subcutaneous fat overlying the right gluteus maximus muscle unchanged compared with 07/02/2006. There is no other fluid collection. The muscles are normal. There is no inguinal lymphadenopathy. There is no inguinal hernia. There is diverticulosis without evidence diverticulitis of the visualized sigmoid colon.  IMPRESSION: 1. No acute osseous injury of the right hip. 2. Severe osteoarthritis of the right hip. 3. Paget's disease of the right hemipelvis. 4. Chronic 7.2 x 2.6 cm complex fluid collection in the subcutaneous fat overlying the right gluteus maximus muscle unchanged compared with 07/02/2006. This likely represents sequela prior trauma.   Electronically Signed   By: Kathreen Devoid   On: 07/13/2015 09:49   Dg Hip Unilat With Pelvis 2-3 Views Right  07/11/2015   CLINICAL DATA:  Right hip pain  EXAM: DG HIP (WITH OR WITHOUT PELVIS) 2-3V RIGHT  COMPARISON:  05/26/2015  FINDINGS: There is foreshortening of the right femoral neck which is suspicious for a subcapital femoral neck fracture. There is advanced osteoarthritis involving the right hip joint with near bone-on-bone contact. Marginal spur formation is noted. No dislocation.  IMPRESSION: 1. Findings are worrisome for impacted subcapital femoral neck fracture involving the right hip. Recommend confirmatory imaging with right hip CT or MRI. 2. Osteopenia. 3. Degenerative joint disease.   Electronically Signed   By: Kerby Moors M.D.   On:  07/11/2015 17:49    Microbiology: Recent Results (from the past 240 hour(s))  Urine culture     Status: None   Collection Time: 07/11/15  6:01 PM  Result Value Ref Range Status   Specimen Description Urine  Final   Special Requests NONE  Final   Culture   Final    NO GROWTH 1 DAY Performed at [facility]    Report Status 07/12/2015 FINAL  Final  Labs: Basic Metabolic Panel:  Recent Labs Lab 07/12/15 1540 07/13/15 0624 07/14/15 0710 07/14/15 1256 07/15/15 0559  NA 129* 128* 126* 128* 130*  K 4.5 4.7 4.4 4.5 4.5  CL 98* 99* 97* 100* 94*  CO2 24 24 25 25 29   GLUCOSE 94 88 100* 105* 95  BUN 18 13 9 8 7   CREATININE 1.22* 1.03* 0.82 0.81 0.84  CALCIUM 8.7* 8.9 8.7* 8.9 9.5   Liver Function Tests:  Recent Labs Lab 07/11/15 1716  AST 29  ALT 21  ALKPHOS 102  BILITOT 0.7  PROT 7.3  ALBUMIN 3.4*   No results for input(s): LIPASE, AMYLASE in the last 168 hours. No results for input(s): AMMONIA in the last 168 hours. CBC:  Recent Labs Lab 07/11/15 1716 07/12/15 0532 07/13/15 0624 07/14/15 0710  WBC 5.3 4.3 4.2 4.2  NEUTROABS 4.2  --   --   --   HGB 17.5* 17.0* 15.7* 15.2*  HCT 49.6* 48.1* 45.5 44.5  MCV 91.2 91.1 93.2 93.9  PLT 426* 358 367 325   Cardiac Enzymes:  Recent Labs Lab 07/12/15 1540 07/12/15 1848 07/13/15 0012 07/13/15 0624  TROPONINI 0.04* 0.03 0.03 0.04*   BNP: BNP (last 3 results)  Recent Labs  04/07/15 1130 05/25/15 2221  BNP 306.0* 154.7*    ProBNP (last 3 results)  Recent Labs  08/03/14 1643  PROBNP 181.0*    CBG: No results for input(s): GLUCAP in the last 168 hours.     SignedDebbe Odea, MD [facility] 07/15/2015, 11:07 AM

## 2015-07-15 NOTE — Clinical Social Work Note (Signed)
Clinical Social Work Assessment  Patient Details  Name: Becky Morales MRN: [id number] Date of Birth: June 25, 1920  Date of referral:  07/15/15               Reason for consult:  Discharge Planning                Permission sought to share information with:  Family Supports Permission granted to share information::  Yes, Verbal Permission Granted  Name::     Renea Ee   Agency::     Relationship::  HCPOA  Contact Information:  [phone]  Housing/Transportation Living arrangements for the past 2 months:  [place] of Information:  Patient, Power of Attorney Patient Interpreter Needed:  None Criminal Activity/Legal Involvement Pertinent to Current Situation/Hospitalization:    Significant Relationships:  Friend Lives with:  Self Do you feel safe going back to the place where you live?  No Need for family participation in patient care:  Yes (Comment)  Care giving concerns:  Pt admitted from home alone. PT recommending SNF. Pt medically ready for discharge today.   Social Worker assessment / plan:  [name] received referral for new SNF and that pt medically ready for discharge today.  [name] met with pt at bedside. [name] introduced self and explained role. Pt confirmed that she lives alone. [name] discussed recommendation for rehab at [facility]. Pt agreeable, but wants to contact friend, [name] to help. [name] contacted pt friend, [name] and pt friend stated that she was on the way to the hospital. Pt agreeable to SNF search to have options available when pt friend arrives.   [name] met with pt and pt friend, [name] at bedside. [name] discussed that pt medically ready for discharge today. SNF options provided. Pt and pt friend, [name] choose bed at [facility]   [name] contacted [facility] and confirmed facility could accept pt today.  [name] facilitated pt discharge needs including contacting facility, faxing pt discharge information via [facility], discussing with pt and pt friend, [name] at bedside, providing  RN phone number to call report, and arranging ambulance transport for pt to [facility].  No further social work needs identified at this time.  [name] signing off.   Employment status:  Retired Nurse, adult PT Recommendations:  [place] / Referral to community resources:  [place]  Patient/Family's Response to care:  Pt alert and oriented x 4. Pt has intermittent confusion. Pt and pt friend agreeable to plan to [facility].  Patient/Family's Understanding of and Emotional Response to Diagnosis, Current Treatment, and Prognosis:  Pt aware that MD discharged pt today and [place] will continue to follow pt needs.   Emotional Assessment Appearance:  Appears stated age Attitude/Demeanor/Rapport:  Other (pt appropriate) Affect (typically observed):  Appropriate (pt appropriate) Orientation:  Oriented to Self, Oriented to Place, Oriented to  Time, Oriented to Situation Alcohol / Substance use:  Not Applicable Psych involvement (Current and /or in the community):  No (Comment)  Discharge Needs  Concerns to be addressed:  Discharge Planning Concerns Readmission within the last 30 days:  No Current discharge risk:  Physical Impairment Barriers to Discharge:  No Barriers Identified   [place], [place], LCSW 07/15/2015, 12:29 PM  Weekend coverage  [phone]

## 2015-07-15 NOTE — Clinical Social Work Placement (Signed)
   CLINICAL SOCIAL WORK PLACEMENT  NOTE  Date:  07/15/2015  Patient Details  Name: Becky Morales MRN: [id number] Date of Birth: 03-23-1920  Clinical Social Work is seeking post-discharge placement for this patient at the [place] level of care (*[name] will initial, date and re-position this form in  chart as items are completed):  Yes   Patient/family provided with [place] Work Department's list of facilities offering this level of care within the geographic area requested by the patient (or if unable, by the patient's family).  Yes   Patient/family informed of their freedom to choose among providers that offer the needed level of care, that participate in Medicare, Medicaid or managed care program needed by the patient, have an available bed and are willing to accept the patient.  Yes   Patient/family informed of [place]'s ownership interest in [facility] and [facility], as well as of the fact that they are under no obligation to receive care at these facilities.  PASRR submitted to EDS on       PASRR number received on       Existing PASRR number confirmed on 07/15/15     FL2 transmitted to all facilities in geographic area requested by pt/family on 07/15/15     FL2 transmitted to all facilities within larger geographic area on       Patient informed that his/her managed care company has contracts with or will negotiate with certain facilities, including the following:        Yes   Patient/family informed of bed offers received.  Patient chooses bed at [facility]     Physician recommends and patient chooses bed at      Patient to be transferred to [facility] on 07/15/15.  Patient to be transferred to facility by ambulance Corey Harold)     Patient family notified on 07/15/15 of transfer.  Name of family member notified:  pt and pt HCPOA, [name] notified at bedside     PHYSICIAN       Additional Comment:     _______________________________________________ Ladell Pier, LCSW 07/15/2015, 12:26 PM

## 2015-07-18 ENCOUNTER — Non-Acute Institutional Stay (SKILLED_NURSING_FACILITY): Payer: Medicare Other | Admitting: Internal Medicine

## 2015-07-18 ENCOUNTER — Encounter: Payer: Self-pay | Admitting: Internal Medicine

## 2015-07-18 DIAGNOSIS — I10 Essential (primary) hypertension: Secondary | ICD-10-CM

## 2015-07-18 DIAGNOSIS — R5381 Other malaise: Secondary | ICD-10-CM | POA: Diagnosis not present

## 2015-07-18 DIAGNOSIS — M1611 Unilateral primary osteoarthritis, right hip: Secondary | ICD-10-CM

## 2015-07-18 DIAGNOSIS — R400 Somnolence: Secondary | ICD-10-CM

## 2015-07-18 DIAGNOSIS — E871 Hypo-osmolality and hyponatremia: Secondary | ICD-10-CM | POA: Diagnosis not present

## 2015-07-18 DIAGNOSIS — E785 Hyperlipidemia, unspecified: Secondary | ICD-10-CM

## 2015-07-18 DIAGNOSIS — K5900 Constipation, unspecified: Secondary | ICD-10-CM | POA: Diagnosis not present

## 2015-07-18 DIAGNOSIS — M79605 Pain in left leg: Secondary | ICD-10-CM | POA: Diagnosis not present

## 2015-07-18 DIAGNOSIS — K219 Gastro-esophageal reflux disease without esophagitis: Secondary | ICD-10-CM | POA: Diagnosis not present

## 2015-07-18 NOTE — Progress Notes (Signed)
Patient ID: Becky Morales, female   DOB: May 06, 1920, 81 y.o.   MRN: [id number]     [facility]  PCP: Garret Reddish, MD  Code Status: Full Code  Allergies  Allergen Reactions  . Ciprofloxacin     emesis  . Paxil [Paroxetine Hcl] Nausea And Vomiting    Chief Complaint  Patient presents with  . New Admit To SNF    New admission      HPI:  81 y.o. patient is here for short term rehabilitation post hospital admission from 07/11/15-07/15/15 after a fall with right hip pain. Xray showed impacted subcapital femoral neck fracture but ct scan of hip was negative for acute fracture. It showed severe osteoarthritis. She was started on pain medication. She had acute renal failure that improved with iv fluids. There were concerns of syncope vs presyncope. Ct scan of head and cervical spine were negative for acute findings. Carotid doppler was negative and echocardiogram showed grade 1 diastolic dysfunction.She has past medical history of hypertension, hyperlipidemia, coronary artery disease with bare metal stent in 2012. She is seen in her room today. She has been somnolent per staff with minimal participation in therapy. She complaints of left leg pain to me. As per staff, somewhat agitated today but is calm during this visit. She was awake last night per nursing. No falls in the facility.    Review of Systems:  Unable to obtain from patient   Past Medical History  Diagnosis Date  . CARCINOID TUMOR 04/13/2007  . HYPERTENSION 04/13/2007  . ALLERGIC RHINITIS 04/13/2007  . GERD 04/13/2007  . HYPERGLYCEMIA 04/13/2007  . CHOLELITHIASIS 05/02/2010  . Arthritis   . Cancer   . Environmental allergies     has mucus in throat  . Diphtheria     age 10  . CAD (coronary artery disease)     NSTEMI.  LHC 09/03/11: pLAD 40-50%, pDx 80-90% (very small), lateral branch of OM 99%, m-dCFX 40%, oRCA 80%, m-dRCA 50-70%, EF 55%.  She had PCI with BMS to lateral branch of the OM.  RCA was tx  medically.   . Hyponatremia   . Murmur     echo 11/12: EF 55-60%, mod calcified AV annulus, mild AI, mild MR  . Dermatophytosis of nail   . UTI (urinary tract infection) 03/2015   Past Surgical History  Procedure Laterality Date  . Tonsillectomy      many years ago  . Cardiac surgery    . Colon surgery      CANCER  . Small bowel repair    . Left heart catheterization with coronary angiogram N/A 09/03/2011    Procedure: LEFT HEART CATHETERIZATION WITH CORONARY ANGIOGRAM;  Surgeon: Peter M Martinique, MD;  Location: [facility] [place] CATH LAB;  Service: Cardiovascular;  Laterality: N/A;  . Percutaneous coronary stent intervention (pci-s)  09/03/2011    Procedure: PERCUTANEOUS CORONARY STENT INTERVENTION (PCI-S);  Surgeon: Peter M Martinique, MD;  Location: [facility] CATH LAB;  Service: Cardiovascular;;   Social History:   reports that she has never smoked. She has never used smokeless tobacco. She reports that she does not drink alcohol or use illicit drugs.  Family History  Problem Relation Age of Onset  . Hypertension Mother   . Uterine cancer Mother   . Ovarian cancer Mother   . Hypertension Father   . Stroke Father   . Hypertension Brother   . Hyperlipidemia Brother   . Heart attack Brother   . Lung cancer Sister  Medications:   Medication List       This list is accurate as of: 07/18/15  4:40 PM.  Always use your most recent med list.               acetaminophen 325 MG tablet  Commonly known as:  TYLENOL  Take 2 tablets (650 mg total) by mouth every 6 (six) hours as needed for mild pain (or Fever >/= 101).     aspirin 81 MG tablet  Take 81 mg by mouth daily.     atorvastatin 80 MG tablet  Commonly known as:  LIPITOR  Take 80 mg by mouth once a week. Take on Mondays     bisacodyl 10 MG suppository  Commonly known as:  DULCOLAX  Place 1 suppository (10 mg total) rectally daily as needed for moderate constipation.     calcium-vitamin D 500-200 MG-UNIT tablet  Commonly known as:   OSCAL WITH D  Take 1 tablet by mouth 2 (two) times daily.     docusate sodium 100 MG capsule  Commonly known as:  COLACE  Take 1 capsule (100 mg total) by mouth 2 (two) times daily.     HYDROcodone-acetaminophen 5-325 MG tablet  Commonly known as:  NORCO/VICODIN  Take 1 tablet by mouth every 6 (six) hours as needed for moderate pain (for pain 1-5, 2 by mouth every 6 hours as needed for pain 6-10).     metoprolol 50 MG tablet  Commonly known as:  LOPRESSOR  Take 1 tablet (50 mg total) by mouth 2 (two) times daily.     MULTI VITAMIN DAILY Tabs  Take 1 tablet by mouth daily. Pt. Not sure of dose.     nitroGLYCERIN 0.4 MG SL tablet  Commonly known as:  NITROSTAT  Place 1 tablet (0.4 mg total) under the tongue every 5 (five) minutes as needed for chest pain.     pantoprazole 40 MG tablet  Commonly known as:  PROTONIX  Take 1 tablet (40 mg total) by mouth daily.     senna-docusate 8.6-50 MG tablet  Commonly known as:  Senokot-S  Take 1 tablet by mouth at bedtime as needed for mild constipation.     valsartan 320 MG tablet  Commonly known as:  DIOVAN  Take 1 tablet (320 mg total) by mouth daily.         Physical Exam: Filed Vitals:   07/18/15 1633  BP: 144/78  Pulse: 66  Temp: 98.2 F (36.8 C)  TempSrc: Oral  Resp: 17  SpO2: 96%    General- elderly female, in no acute distress Head- normocephalic, atraumatic Throat- moist mucus membrane Eyes- no pallor, no icterus, no discharge, normal conjunctiva, normal sclera Neck- no cervical lymphadenopathy Cardiovascular- normal s1,s2, no murmurs, no leg edema Respiratory- bilateral clear to auscultation, no wheeze, no rhonchi, no crackles, no use of accessory muscles Abdomen- bowel sounds present, soft, non tender Musculoskeletal- pain with slightest movement of her left leg, generalized weakness, able to move her toes and good circulation. Right leg ROM limited with her severe OA Neurological- follows simple command,  falling asleep in between conversation Skin- warm and dry   Labs reviewed: Basic Metabolic Panel:  Recent Labs  05/30/15 0431  05/31/15 0342  06/01/15 0511  07/14/15 0710 07/14/15 1256 07/15/15 0559  NA 126*  < > 125*  < > 127*  < > 126* 128* 130*  K 4.6  < > 4.6  < > 4.8  < > 4.4 4.5 4.5  CL 92*  < > 91*  < > 89*  < > 97* 100* 94*  CO2 25  < > 25  < > 27  < > 25 25 29   GLUCOSE 113*  < > 101*  < > 92  < > 100* 105* 95  BUN 13  < > 19  < > 22*  < > 9 8 7   CREATININE 0.96  < > 1.01*  < > 0.99  < > 0.82 0.81 0.84  CALCIUM 8.8*  < > 8.6*  < > 9.0  < > 8.7* 8.9 9.5  MG 1.7  --  1.8  --  1.8  --   --   --   --   < > = values in this interval not displayed. Liver Function Tests:  Recent Labs  05/31/15 0342 06/01/15 0511 07/11/15 1716  AST 21 23 29   ALT 15 18 21   ALKPHOS 93 89 102  BILITOT 0.8 0.9 0.7  PROT 6.4* 6.4* 7.3  ALBUMIN 2.4* 2.4* 3.4*    Recent Labs  05/25/15 1743  LIPASE 35   No results for input(s): AMMONIA in the last 8760 hours. CBC:  Recent Labs  05/31/15 0342 06/01/15 0511 07/11/15 1716 07/12/15 0532 07/13/15 0624 07/14/15 0710  WBC 10.3 7.6 5.3 4.3 4.2 4.2  NEUTROABS 7.4 5.3 4.2  --   --   --   HGB 16.4* 16.4* 17.5* 17.0* 15.7* 15.2*  HCT 46.7* 46.7* 49.6* 48.1* 45.5 44.5  MCV 90.0 91.0 91.2 91.1 93.2 93.9  PLT 403* 397 426* 358 367 325   Cardiac Enzymes:  Recent Labs  07/12/15 1848 07/13/15 0012 07/13/15 0624  TROPONINI 0.03 0.03 0.04*   BNP: Invalid input(s): POCBNP CBG:  Recent Labs  05/31/15 0549 06/02/15 0623 06/03/15 0620  GLUCAP 113* 96 93    Radiological Exams: Dg Chest 2 View  07/11/2015   CLINICAL DATA:  Fall from bed.  Right hip pain.  EXAM: CHEST  2 VIEW  COMPARISON:  05/29/2015  FINDINGS: The heart size and mediastinal contours are within normal limits. Both lungs are clear. The bones appear diffusely osteopenic. There is no acute fracture or subluxation identified.  IMPRESSION: No active cardiopulmonary  disease.   Electronically Signed   By: Kerby Moors M.D.   On: 07/11/2015 17:52   Ct Head Wo Contrast  07/11/2015   CLINICAL DATA:  Pt is from home, fell out of bed, right hip pain today, left sided temporal abrasion. Denies LOC, no blood thinners. No obvious deformity. Pt usually uses a walker at home, is unsteady on her feet. Pt also had a fall on Saturday  EXAM: CT HEAD WITHOUT CONTRAST  CT CERVICAL SPINE WITHOUT CONTRAST  TECHNIQUE: Multidetector CT imaging of the head and cervical spine was performed following the standard protocol without intravenous contrast. Multiplanar CT image reconstructions of the cervical spine were also generated.  COMPARISON:  05/25/2015  FINDINGS: CT HEAD FINDINGS  Partial opacification of right mastoid air cells as before. Atherosclerotic and physiologic intracranial calcifications. Diffuse parenchymal atrophy. Prominence of lateral ventricles as before. Patchy areas of hypoattenuation in deep and periventricular white matter bilaterally. Negative for acute intracranial hemorrhage, mass lesion, acute infarction, midline shift, or mass-effect. Acute infarct may be inapparent on noncontrast CT. Ventricles and sulci symmetric. Bone windows demonstrate no focal lesion.  CT CERVICAL SPINE FINDINGS  Normal alignment. No prevertebral soft tissue swelling. Facets are seated. Mild narrowing of the C2-3 and C5-6 interspaces. Fairly exuberant degenerative change at  the atlantoaxial articulation. Anterior and posterior endplate spurring [id number]. Negative for fracture. Bilateral carotid calcified plaque. Scarring in the visualized lung apices. Calcification in the apical segment left upper lobe, incompletely visualized.  IMPRESSION: 1. Negative for bleed or other acute intracranial process. 2. Atrophy and nonspecific white matter changes as before. 3. Negative for cervical fracture or other acute bone abnormality. 4. Multilevel cervical degenerative changes as above.   Electronically Signed    By: Lucrezia Europe M.D.   On: 07/11/2015 18:01   Ct Cervical Spine Wo Contrast  07/11/2015   CLINICAL DATA:  Pt is from home, fell out of bed, right hip pain today, left sided temporal abrasion. Denies LOC, no blood thinners. No obvious deformity. Pt usually uses a walker at home, is unsteady on her feet. Pt also had a fall on Saturday  EXAM: CT HEAD WITHOUT CONTRAST  CT CERVICAL SPINE WITHOUT CONTRAST  TECHNIQUE: Multidetector CT imaging of the head and cervical spine was performed following the standard protocol without intravenous contrast. Multiplanar CT image reconstructions of the cervical spine were also generated.  COMPARISON:  05/25/2015  FINDINGS: CT HEAD FINDINGS  Partial opacification of right mastoid air cells as before. Atherosclerotic and physiologic intracranial calcifications. Diffuse parenchymal atrophy. Prominence of lateral ventricles as before. Patchy areas of hypoattenuation in deep and periventricular white matter bilaterally. Negative for acute intracranial hemorrhage, mass lesion, acute infarction, midline shift, or mass-effect. Acute infarct may be inapparent on noncontrast CT. Ventricles and sulci symmetric. Bone windows demonstrate no focal lesion.  CT CERVICAL SPINE FINDINGS  Normal alignment. No prevertebral soft tissue swelling. Facets are seated. Mild narrowing of the C2-3 and C5-6 interspaces. Fairly exuberant degenerative change at the atlantoaxial articulation. Anterior and posterior endplate spurring R6-E4. Negative for fracture. Bilateral carotid calcified plaque. Scarring in the visualized lung apices. Calcification in the apical segment left upper lobe, incompletely visualized.  IMPRESSION: 1. Negative for bleed or other acute intracranial process. 2. Atrophy and nonspecific white matter changes as before. 3. Negative for cervical fracture or other acute bone abnormality. 4. Multilevel cervical degenerative changes as above.   Electronically Signed   By: Lucrezia Europe M.D.   On:  07/11/2015 18:01   Dg Hip Unilat With Pelvis 2-3 Views Right  07/11/2015   CLINICAL DATA:  Right hip pain  EXAM: DG HIP (WITH OR WITHOUT PELVIS) 2-3V RIGHT  COMPARISON:  05/26/2015  FINDINGS: There is foreshortening of the right femoral neck which is suspicious for a subcapital femoral neck fracture. There is advanced osteoarthritis involving the right hip joint with near bone-on-bone contact. Marginal spur formation is noted. No dislocation.  IMPRESSION: 1. Findings are worrisome for impacted subcapital femoral neck fracture involving the right hip. Recommend confirmatory imaging with right hip CT or MRI. 2. Osteopenia. 3. Degenerative joint disease.   Electronically Signed   By: Kerby Moors M.D.   On: 07/11/2015 17:49     Assessment/Plan  Physical deconditioning Will have her work with physical therapy and occupational therapy team to help with gait training and muscle strengthening exercises.fall precautions. Skin care. Encourage to be out of bed.   Somnolence She was awake last night and this could be contributing to her increased daytime sleepiness along with her deconditioning. Her norco could be contributing some. D/c norco. Change tylenol to 500 mg 2 tab bid and add tramadol 50 mg 1-2 tab q6h prn breakthrough pain. Check cbc with diff and cmp to rule out infection and electrolyte imbalance  Right leg OA Continue  tylenol as above for pain and monitor. Continue oscal with vitamin d. To work with therapy team  Left leg pain Winces and cries out even with slightest movement of her left leg. Will get xray of left hip and femur to rule out acute fracture  HLD Continue lipitor 80 mg daily  Constipation Continue colace 100 mg bid and prn dulcolax  HTN Continue lopressor 50 mg bid and valsartan 320 mg daily and monitor bp  gerd Continue protonix 40 mg daily  Hyponatremia Monitor bmp given her increased somnolence   Goals of care: short term rehabilitation   Labs/tests  ordered: cbc with diff, cmp next lab, xray left hip and left femur  Family/ staff Communication: reviewed care plan with patient and nursing supervisor    Blanchie Serve, MD  [facility] (Monday-Friday 8 am - 5 pm) [phone] (afterhours)

## 2015-07-19 ENCOUNTER — Non-Acute Institutional Stay (SKILLED_NURSING_FACILITY): Payer: Medicare Other | Admitting: Nurse Practitioner

## 2015-07-19 DIAGNOSIS — M79605 Pain in left leg: Secondary | ICD-10-CM

## 2015-07-19 DIAGNOSIS — E871 Hypo-osmolality and hyponatremia: Secondary | ICD-10-CM | POA: Diagnosis not present

## 2015-07-19 DIAGNOSIS — F919 Conduct disorder, unspecified: Secondary | ICD-10-CM

## 2015-07-19 DIAGNOSIS — M79604 Pain in right leg: Secondary | ICD-10-CM | POA: Diagnosis not present

## 2015-07-19 DIAGNOSIS — IMO0002 Reserved for concepts with insufficient information to code with codable children: Secondary | ICD-10-CM

## 2015-07-19 NOTE — Progress Notes (Signed)
Patient ID: Becky Morales, female   DOB: 1920-01-16, 81 y.o.   MRN: [id number]    Nursing Home Location:  [place] of Service: SNF [phone]  PCP: Garret Reddish, MD  Allergies  Allergen Reactions  . Ciprofloxacin     emesis  . Paxil [Paroxetine Hcl] Nausea And Vomiting    Chief Complaint  Patient presents with  . Acute Visit    behaviors     HPI:  Patient is a 81 y.o. female seen today at [facility] for an acute visit after nursing reported that patient having increasingly aggressive behaviors.  She has attempted to scratch and hit nurses, and has deliberately knocked things off her table onto the floor.  Per nursing, the patient's family member states that she has been removed from many facilities in the past due to aggressive behaviors. Today, she is seen in her room, she is sitting up in her wheelchair and is alert.  She is oriented x 4.  Nursing reports that patient will appear oriented and answer questions appropriately, but then behavior will turn aggressive quickly.  She was pleasant for this exam. Denies anxiety, depressor or mood changes.  Does report lower legs are painful and tender to the touch.  Dr Bubba Camp evaluated yesterday and order xray.   Review of Systems:  Review of Systems  Constitutional: Negative for fever and chills.  HENT: Negative for postnasal drip, sinus pressure and sore throat.   Respiratory: Negative for cough, shortness of breath and wheezing.   Cardiovascular: Negative for chest pain, palpitations and leg swelling.  Gastrointestinal: Negative for abdominal pain, diarrhea and constipation.  Genitourinary: Negative for dysuria, urgency and flank pain.  Musculoskeletal: Negative for back pain, arthralgias and neck pain.  Skin: Negative for color change, pallor, rash and wound.  Neurological: Negative for dizziness, light-headedness and headaches.  Psychiatric/Behavioral: Negative for confusion and agitation. The  patient is not nervous/anxious.        Patient says she is not nervous or anxious, does not have any trouble sleeping, does not ever feel agitated.      Past Medical History  Diagnosis Date  . CARCINOID TUMOR 04/13/2007  . HYPERTENSION 04/13/2007  . ALLERGIC RHINITIS 04/13/2007  . GERD 04/13/2007  . HYPERGLYCEMIA 04/13/2007  . CHOLELITHIASIS 05/02/2010  . Arthritis   . Cancer   . Environmental allergies     has mucus in throat  . Diphtheria     age 27  . CAD (coronary artery disease)     NSTEMI.  LHC 09/03/11: pLAD 40-50%, pDx 80-90% (very small), lateral branch of OM 99%, m-dCFX 40%, oRCA 80%, m-dRCA 50-70%, EF 55%.  She had PCI with BMS to lateral branch of the OM.  RCA was tx medically.   . Hyponatremia   . Murmur     echo 11/12: EF 55-60%, mod calcified AV annulus, mild AI, mild MR  . Dermatophytosis of nail   . UTI (urinary tract infection) 03/2015   Past Surgical History  Procedure Laterality Date  . Tonsillectomy      many years ago  . Cardiac surgery    . Colon surgery      CANCER  . Small bowel repair    . Left heart catheterization with coronary angiogram N/A 09/03/2011    Procedure: LEFT HEART CATHETERIZATION WITH CORONARY ANGIOGRAM;  Surgeon: Peter M Martinique, MD;  Location: [facility] CATH LAB;  Service: Cardiovascular;  Laterality: N/A;  . Percutaneous coronary  stent intervention (pci-s)  09/03/2011    Procedure: PERCUTANEOUS CORONARY STENT INTERVENTION (PCI-S);  Surgeon: Peter M Martinique, MD;  Location: [facility] CATH LAB;  Service: Cardiovascular;;   Social History:   reports that she has never smoked. She has never used smokeless tobacco. She reports that she does not drink alcohol or use illicit drugs.  Family History  Problem Relation Age of Onset  . Hypertension Mother   . Uterine cancer Mother   . Ovarian cancer Mother   . Hypertension Father   . Stroke Father   . Hypertension Brother   . Hyperlipidemia Brother   . Heart attack Brother   . Lung cancer Sister      Medications: Patient's Medications  New Prescriptions   No medications on file  Previous Medications   ACETAMINOPHEN (TYLENOL) 325 MG TABLET    Take 2 tablets (650 mg total) by mouth every 6 (six) hours as needed for mild pain (or Fever >/= 101).   ASPIRIN 81 MG TABLET    Take 81 mg by mouth daily.    ATORVASTATIN (LIPITOR) 80 MG TABLET    Take 80 mg by mouth once a week. Take on Mondays   BISACODYL (DULCOLAX) 10 MG SUPPOSITORY    Place 1 suppository (10 mg total) rectally daily as needed for moderate constipation.   CALCIUM-VITAMIN D (OSCAL WITH D) 500-200 MG-UNIT PER TABLET    Take 1 tablet by mouth 2 (two) times daily.   DOCUSATE SODIUM (COLACE) 100 MG CAPSULE    Take 1 capsule (100 mg total) by mouth 2 (two) times daily.   HYDROCODONE-ACETAMINOPHEN (NORCO/VICODIN) 5-325 MG TABLET    Take 1 tablet by mouth every 6 (six) hours as needed for moderate pain (for pain 1-5, 2 by mouth every 6 hours as needed for pain 6-10).   METOPROLOL TARTRATE (LOPRESSOR) 50 MG TABLET    Take 1 tablet (50 mg total) by mouth 2 (two) times daily.   MULTIPLE VITAMIN (MULTI VITAMIN DAILY) TABS    Take 1 tablet by mouth daily. Pt. Not sure of dose.   NITROGLYCERIN (NITROSTAT) 0.4 MG SL TABLET    Place 1 tablet (0.4 mg total) under the tongue every 5 (five) minutes as needed for chest pain.   PANTOPRAZOLE (PROTONIX) 40 MG TABLET    Take 1 tablet (40 mg total) by mouth daily.   SENNA-DOCUSATE (SENOKOT-S) 8.6-50 MG PER TABLET    Take 1 tablet by mouth at bedtime as needed for mild constipation.   VALSARTAN (DIOVAN) 320 MG TABLET    Take 1 tablet (320 mg total) by mouth daily.  Modified Medications   No medications on file  Discontinued Medications   No medications on file     Physical Exam: Filed Vitals:   07/19/15 1352  BP: 144/78  Pulse: 66  Temp: 98.2 F (36.8 C)  TempSrc: Oral  Resp: 18  Weight: 117 lb 9.6 oz (53.343 kg)    Physical Exam  Constitutional: She is oriented to person, place, and  time. She appears well-developed and well-nourished. No distress.  HENT:  Head: Normocephalic and atraumatic.  Eyes: Conjunctivae are normal. Pupils are equal, round, and reactive to light.  Neck: Normal range of motion. Neck supple. No JVD present.  Cardiovascular: Normal rate, regular rhythm, normal heart sounds and intact distal pulses.   No murmur heard. Pulmonary/Chest: Effort normal and breath sounds normal.  Abdominal: Soft. Bowel sounds are normal. There is no tenderness.  Musculoskeletal: Normal range of motion. She exhibits tenderness (  bilateral calves).  Lymphadenopathy:    She has no cervical adenopathy.  Neurological: She is alert and oriented to person, place, and time.  Skin: Skin is warm and dry.  Psychiatric: She has a normal mood and affect.    Labs reviewed: Basic Metabolic Panel:  Recent Labs  05/30/15 0431  05/31/15 0342  06/01/15 0511  07/14/15 0710 07/14/15 1256 07/15/15 0559  NA 126*  < > 125*  < > 127*  < > 126* 128* 130*  K 4.6  < > 4.6  < > 4.8  < > 4.4 4.5 4.5  CL 92*  < > 91*  < > 89*  < > 97* 100* 94*  CO2 25  < > 25  < > 27  < > 25 25 29   GLUCOSE 113*  < > 101*  < > 92  < > 100* 105* 95  BUN 13  < > 19  < > 22*  < > 9 8 7   CREATININE 0.96  < > 1.01*  < > 0.99  < > 0.82 0.81 0.84  CALCIUM 8.8*  < > 8.6*  < > 9.0  < > 8.7* 8.9 9.5  MG 1.7  --  1.8  --  1.8  --   --   --   --   < > = values in this interval not displayed. Liver Function Tests:  Recent Labs  05/31/15 0342 06/01/15 0511 07/11/15 1716  AST 21 23 29   ALT 15 18 21   ALKPHOS 93 89 102  BILITOT 0.8 0.9 0.7  PROT 6.4* 6.4* 7.3  ALBUMIN 2.4* 2.4* 3.4*    Recent Labs  05/25/15 1743  LIPASE 35   No results for input(s): AMMONIA in the last 8760 hours. CBC:  Recent Labs  05/31/15 0342 06/01/15 0511 07/11/15 1716 07/12/15 0532 07/13/15 0624 07/14/15 0710  WBC 10.3 7.6 5.3 4.3 4.2 4.2  NEUTROABS 7.4 5.3 4.2  --   --   --   HGB 16.4* 16.4* 17.5* 17.0* 15.7* 15.2*   HCT 46.7* 46.7* 49.6* 48.1* 45.5 44.5  MCV 90.0 91.0 91.2 91.1 93.2 93.9  PLT 403* 397 426* 358 367 325   TSH:  Recent Labs  08/11/14 1030 02/17/15 1229 05/30/15 0937  TSH 6.03* 5.33* 3.697   A1C: Lab Results  Component Value Date   HGBA1C 6.0 12/02/2008   Lipid Panel:  Recent Labs  10/05/14 0836  CHOL 138  HDL 59.40  LDLCALC 64  TRIG 72.0  CHOLHDL 2    Radiological Exams: Dg Chest 2 View  07/11/2015   CLINICAL DATA:  Fall from bed.  Right hip pain.  EXAM: CHEST  2 VIEW  COMPARISON:  05/29/2015  FINDINGS: The heart size and mediastinal contours are within normal limits. Both lungs are clear. The bones appear diffusely osteopenic. There is no acute fracture or subluxation identified.  IMPRESSION: No active cardiopulmonary disease.   Electronically Signed   By: Kerby Moors M.D.   On: 07/11/2015 17:52   Ct Head Wo Contrast  07/11/2015   CLINICAL DATA:  Pt is from home, fell out of bed, right hip pain today, left sided temporal abrasion. Denies LOC, no blood thinners. No obvious deformity. Pt usually uses a walker at home, is unsteady on her feet. Pt also had a fall on Saturday  EXAM: CT HEAD WITHOUT CONTRAST  CT CERVICAL SPINE WITHOUT CONTRAST  TECHNIQUE: Multidetector CT imaging of the head and cervical spine was performed following the standard protocol  without intravenous contrast. Multiplanar CT image reconstructions of the cervical spine were also generated.  COMPARISON:  05/25/2015  FINDINGS: CT HEAD FINDINGS  Partial opacification of right mastoid air cells as before. Atherosclerotic and physiologic intracranial calcifications. Diffuse parenchymal atrophy. Prominence of lateral ventricles as before. Patchy areas of hypoattenuation in deep and periventricular white matter bilaterally. Negative for acute intracranial hemorrhage, mass lesion, acute infarction, midline shift, or mass-effect. Acute infarct may be inapparent on noncontrast CT. Ventricles and sulci symmetric.  Bone windows demonstrate no focal lesion.  CT CERVICAL SPINE FINDINGS  Normal alignment. No prevertebral soft tissue swelling. Facets are seated. Mild narrowing of the C2-3 and C5-6 interspaces. Fairly exuberant degenerative change at the atlantoaxial articulation. Anterior and posterior endplate spurring X3-K4. Negative for fracture. Bilateral carotid calcified plaque. Scarring in the visualized lung apices. Calcification in the apical segment left upper lobe, incompletely visualized.  IMPRESSION: 1. Negative for bleed or other acute intracranial process. 2. Atrophy and nonspecific white matter changes as before. 3. Negative for cervical fracture or other acute bone abnormality. 4. Multilevel cervical degenerative changes as above.   Electronically Signed   By: Lucrezia Europe M.D.   On: 07/11/2015 18:01   Ct Cervical Spine Wo Contrast  07/11/2015   CLINICAL DATA:  Pt is from home, fell out of bed, right hip pain today, left sided temporal abrasion. Denies LOC, no blood thinners. No obvious deformity. Pt usually uses a walker at home, is unsteady on her feet. Pt also had a fall on Saturday  EXAM: CT HEAD WITHOUT CONTRAST  CT CERVICAL SPINE WITHOUT CONTRAST  TECHNIQUE: Multidetector CT imaging of the head and cervical spine was performed following the standard protocol without intravenous contrast. Multiplanar CT image reconstructions of the cervical spine were also generated.  COMPARISON:  05/25/2015  FINDINGS: CT HEAD FINDINGS  Partial opacification of right mastoid air cells as before. Atherosclerotic and physiologic intracranial calcifications. Diffuse parenchymal atrophy. Prominence of lateral ventricles as before. Patchy areas of hypoattenuation in deep and periventricular white matter bilaterally. Negative for acute intracranial hemorrhage, mass lesion, acute infarction, midline shift, or mass-effect. Acute infarct may be inapparent on noncontrast CT. Ventricles and sulci symmetric. Bone windows demonstrate no  focal lesion.  CT CERVICAL SPINE FINDINGS  Normal alignment. No prevertebral soft tissue swelling. Facets are seated. Mild narrowing of the C2-3 and C5-6 interspaces. Fairly exuberant degenerative change at the atlantoaxial articulation. Anterior and posterior endplate spurring M0-N0. Negative for fracture. Bilateral carotid calcified plaque. Scarring in the visualized lung apices. Calcification in the apical segment left upper lobe, incompletely visualized.  IMPRESSION: 1. Negative for bleed or other acute intracranial process. 2. Atrophy and nonspecific white matter changes as before. 3. Negative for cervical fracture or other acute bone abnormality. 4. Multilevel cervical degenerative changes as above.   Electronically Signed   By: Lucrezia Europe M.D.   On: 07/11/2015 18:01   Dg Hip Unilat With Pelvis 2-3 Views Right  07/11/2015   CLINICAL DATA:  Right hip pain  EXAM: DG HIP (WITH OR WITHOUT PELVIS) 2-3V RIGHT  COMPARISON:  05/26/2015  FINDINGS: There is foreshortening of the right femoral neck which is suspicious for a subcapital femoral neck fracture. There is advanced osteoarthritis involving the right hip joint with near bone-on-bone contact. Marginal spur formation is noted. No dislocation.  IMPRESSION: 1. Findings are worrisome for impacted subcapital femoral neck fracture involving the right hip. Recommend confirmatory imaging with right hip CT or MRI. 2. Osteopenia. 3. Degenerative joint disease.  Electronically Signed   By: Kerby Moors M.D.   On: 07/11/2015 17:49    Assessment/Plan 1. Behavioral disorder Patient appears to be intact neurologically.  Given the erratic nature of the the behavior, will consult Pysch for further evaluation.    2. Hyponatremia Chronically low 128-130 but stable. Sodium 127 on recent lab, Will get follow up BMP reassess. Medication reviewed, does not appear to be related to medication.   3. Leg Pain Xray obtained was negative for acute findings. Will get doppler  of bilatera LE to rule out DVT. Likely of neuropathic origin due to description.  Will start Lyrica 50mg  po bid.        Carlos American. Harle Battiest  [facility] [phone] 8 am - 5 pm) [phone] (after hours)

## 2015-07-24 LAB — HEPATIC FUNCTION PANEL
ALK PHOS: 112 U/L (ref 25–125)
ALT: 18 U/L (ref 7–35)
AST: 16 U/L (ref 13–35)
BILIRUBIN, TOTAL: 0.6 mg/dL

## 2015-07-24 LAB — BASIC METABOLIC PANEL
BUN: 19 mg/dL (ref 4–21)
Creatinine: 1 mg/dL (ref <=1.1)
Glucose: 78 mg/dL
Potassium: 5.1 mmol/L (ref 3.4–5.3)
SODIUM: 128 mmol/L — AB (ref 137–147)

## 2015-07-24 LAB — CBC AND DIFFERENTIAL
HEMATOCRIT: 48 % — AB (ref 36–46)
Hemoglobin: 16.2 g/dL — AB (ref 12.0–16.0)
PLATELETS: 489 10*3/uL — AB (ref 150–399)
WBC: 5.6 10^3/mL

## 2015-08-02 ENCOUNTER — Non-Acute Institutional Stay (SKILLED_NURSING_FACILITY): Payer: Medicare Other | Admitting: Nurse Practitioner

## 2015-08-02 DIAGNOSIS — E871 Hypo-osmolality and hyponatremia: Secondary | ICD-10-CM | POA: Diagnosis not present

## 2015-08-02 NOTE — Progress Notes (Signed)
Patient ID: Becky Morales, female   DOB: 1920/08/01, 81 y.o.   MRN: [id number]    Nursing Home Location:  [place] of Service: SNF [phone])  PCP: Garret Reddish, MD  Allergies  Allergen Reactions  . Ciprofloxacin     emesis  . Paxil [Paroxetine Hcl] Nausea And Vomiting    Chief Complaint  Patient presents with  . Acute Visit    HPI:  Patient is a 81 y.o. female seen today at [facility] for an acute visit due to hyponatremia with a sodium of 122. Pt with a hx of htn, GERD, hyperlipidemia, CAD, constipation, hyponatremia and behaviors. BMP was drawn to follow sodium and lab reveals Na of 122. pts mental status is a baseline. Nursing without any concerns of increased confusion or increase in behaviors. Pt with baseline behaviors and psych consult has been placed. Pt denies drinking an excessive amount of water or increase in urination or thirst. Pt denies weakness, dizziness, being light headed or changes in vision. No swelling to LE, chest pains or shortness of breath.   Review of Systems:  Review of Systems  Constitutional: Negative for fever and chills.  HENT: Negative for postnasal drip, sinus pressure and sore throat.   Respiratory: Negative for cough, shortness of breath and wheezing.   Cardiovascular: Negative for chest pain, palpitations and leg swelling.  Gastrointestinal: Negative for abdominal pain, diarrhea and constipation.  Genitourinary: Negative for dysuria, urgency and flank pain.  Musculoskeletal: Negative for back pain, arthralgias and neck pain.  Skin: Negative for color change, pallor, rash and wound.  Neurological: Negative for dizziness, light-headedness and headaches.  Psychiatric/Behavioral: Negative for confusion and agitation. The patient is not nervous/anxious.     Past Medical History  Diagnosis Date  . CARCINOID TUMOR 04/13/2007  . HYPERTENSION 04/13/2007  . ALLERGIC RHINITIS 04/13/2007  . GERD 04/13/2007  .  HYPERGLYCEMIA 04/13/2007  . CHOLELITHIASIS 05/02/2010  . Arthritis   . Cancer   . Environmental allergies     has mucus in throat  . Diphtheria     age 69  . CAD (coronary artery disease)     NSTEMI.  LHC 09/03/11: pLAD 40-50%, pDx 80-90% (very small), lateral branch of OM 99%, m-dCFX 40%, oRCA 80%, m-dRCA 50-70%, EF 55%.  She had PCI with BMS to lateral branch of the OM.  RCA was tx medically.   . Hyponatremia   . Murmur     echo 11/12: EF 55-60%, mod calcified AV annulus, mild AI, mild MR  . Dermatophytosis of nail   . UTI (urinary tract infection) 03/2015   Past Surgical History  Procedure Laterality Date  . Tonsillectomy      many years ago  . Cardiac surgery    . Colon surgery      CANCER  . Small bowel repair    . Left heart catheterization with coronary angiogram N/A 09/03/2011    Procedure: LEFT HEART CATHETERIZATION WITH CORONARY ANGIOGRAM;  Surgeon: Peter M Martinique, MD;  Location: [facility] CATH LAB;  Service: Cardiovascular;  Laterality: N/A;  . Percutaneous coronary stent intervention (pci-s)  09/03/2011    Procedure: PERCUTANEOUS CORONARY STENT INTERVENTION (PCI-S);  Surgeon: Peter M Martinique, MD;  Location: [facility] CATH LAB;  Service: Cardiovascular;;   Social History:   reports that she has never smoked. She has never used smokeless tobacco. She reports that she does not drink alcohol or use illicit drugs.  Family History  Problem Relation  Age of Onset  . Hypertension Mother   . Uterine cancer Mother   . Ovarian cancer Mother   . Hypertension Father   . Stroke Father   . Hypertension Brother   . Hyperlipidemia Brother   . Heart attack Brother   . Lung cancer Sister     Medications: Patient's Medications  New Prescriptions   No medications on file  Previous Medications   ACETAMINOPHEN (TYLENOL) 325 MG TABLET    Take 2 tablets (650 mg total) by mouth every 6 (six) hours as needed for mild pain (or Fever >/= 101).   ASPIRIN 81 MG TABLET    Take 81 mg by mouth daily.     ATORVASTATIN (LIPITOR) 80 MG TABLET    Take 80 mg by mouth once a week. Take on Mondays   BISACODYL (DULCOLAX) 10 MG SUPPOSITORY    Place 1 suppository (10 mg total) rectally daily as needed for moderate constipation.   CALCIUM-VITAMIN D (OSCAL WITH D) 500-200 MG-UNIT PER TABLET    Take 1 tablet by mouth 2 (two) times daily.   DOCUSATE SODIUM (COLACE) 100 MG CAPSULE    Take 1 capsule (100 mg total) by mouth 2 (two) times daily.   HYDROCODONE-ACETAMINOPHEN (NORCO/VICODIN) 5-325 MG TABLET    Take 1 tablet by mouth every 6 (six) hours as needed for moderate pain (for pain 1-5, 2 by mouth every 6 hours as needed for pain 6-10).   METOPROLOL TARTRATE (LOPRESSOR) 50 MG TABLET    Take 1 tablet (50 mg total) by mouth 2 (two) times daily.   MULTIPLE VITAMIN (MULTI VITAMIN DAILY) TABS    Take 1 tablet by mouth daily. Pt. Not sure of dose.   NITROGLYCERIN (NITROSTAT) 0.4 MG SL TABLET    Place 1 tablet (0.4 mg total) under the tongue every 5 (five) minutes as needed for chest pain.   PANTOPRAZOLE (PROTONIX) 40 MG TABLET    Take 1 tablet (40 mg total) by mouth daily.   SENNA-DOCUSATE (SENOKOT-S) 8.6-50 MG PER TABLET    Take 1 tablet by mouth at bedtime as needed for mild constipation.   VALSARTAN (DIOVAN) 320 MG TABLET    Take 1 tablet (320 mg total) by mouth daily.  Modified Medications   No medications on file  Discontinued Medications   No medications on file     Physical Exam: Filed Vitals:   08/02/15 1650  BP: 136/76  Pulse: 86  Temp: 97.7 F (36.5 C)  Resp: 20    Physical Exam  Constitutional: She is oriented to person, place, and time. She appears well-developed and well-nourished. No distress.  HENT:  Head: Normocephalic and atraumatic.  Eyes: Conjunctivae are normal. Pupils are equal, round, and reactive to light.  Neck: Normal range of motion. Neck supple. No JVD present.  Cardiovascular: Normal rate, regular rhythm, normal heart sounds and intact distal pulses.   No murmur  heard. Pulmonary/Chest: Effort normal and breath sounds normal.  Abdominal: Soft. Bowel sounds are normal.  Musculoskeletal: Normal range of motion. She exhibits no edema.  Lymphadenopathy:    She has no cervical adenopathy.  Neurological: She is alert and oriented to person, place, and time.  Skin: Skin is warm and dry.  Psychiatric: She has a normal mood and affect.    Labs reviewed: Basic Metabolic Panel:  Recent Labs  05/30/15 0431  05/31/15 0342  06/01/15 0511  07/14/15 0710 07/14/15 1256 07/15/15 0559  NA 126*  < > 125*  < > 127*  < >  126* 128* 130*  K 4.6  < > 4.6  < > 4.8  < > 4.4 4.5 4.5  CL 92*  < > 91*  < > 89*  < > 97* 100* 94*  CO2 25  < > 25  < > 27  < > 25 25 29   GLUCOSE 113*  < > 101*  < > 92  < > 100* 105* 95  BUN 13  < > 19  < > 22*  < > 9 8 7   CREATININE 0.96  < > 1.01*  < > 0.99  < > 0.82 0.81 0.84  CALCIUM 8.8*  < > 8.6*  < > 9.0  < > 8.7* 8.9 9.5  MG 1.7  --  1.8  --  1.8  --   --   --   --   < > = values in this interval not displayed. Liver Function Tests:  Recent Labs  05/31/15 0342 06/01/15 0511 07/11/15 1716  AST 21 23 29   ALT 15 18 21   ALKPHOS 93 89 102  BILITOT 0.8 0.9 0.7  PROT 6.4* 6.4* 7.3  ALBUMIN 2.4* 2.4* 3.4*    Recent Labs  05/25/15 1743  LIPASE 35   No results for input(s): AMMONIA in the last 8760 hours. CBC:  Recent Labs  05/31/15 0342 06/01/15 0511 07/11/15 1716 07/12/15 0532 07/13/15 0624 07/14/15 0710  WBC 10.3 7.6 5.3 4.3 4.2 4.2  NEUTROABS 7.4 5.3 4.2  --   --   --   HGB 16.4* 16.4* 17.5* 17.0* 15.7* 15.2*  HCT 46.7* 46.7* 49.6* 48.1* 45.5 44.5  MCV 90.0 91.0 91.2 91.1 93.2 93.9  PLT 403* 397 426* 358 367 325   TSH:  Recent Labs  08/11/14 1030 02/17/15 1229 05/30/15 0937  TSH 6.03* 5.33* 3.697   A1C: Lab Results  Component Value Date   HGBA1C 6.0 12/02/2008   Lipid Panel:  Recent Labs  10/05/14 0836  CHOL 138  HDL 59.40  LDLCALC 64  TRIG 72.0  CHOLHDL 2    Radiological  Exams: Dg Chest 2 View  07/11/2015   CLINICAL DATA:  Fall from bed.  Right hip pain.  EXAM: CHEST  2 VIEW  COMPARISON:  05/29/2015  FINDINGS: The heart size and mediastinal contours are within normal limits. Both lungs are clear. The bones appear diffusely osteopenic. There is no acute fracture or subluxation identified.  IMPRESSION: No active cardiopulmonary disease.   Electronically Signed   By: Kerby Moors M.D.   On: 07/11/2015 17:52   Ct Head Wo Contrast  07/11/2015   CLINICAL DATA:  Pt is from home, fell out of bed, right hip pain today, left sided temporal abrasion. Denies LOC, no blood thinners. No obvious deformity. Pt usually uses a walker at home, is unsteady on her feet. Pt also had a fall on Saturday  EXAM: CT HEAD WITHOUT CONTRAST  CT CERVICAL SPINE WITHOUT CONTRAST  TECHNIQUE: Multidetector CT imaging of the head and cervical spine was performed following the standard protocol without intravenous contrast. Multiplanar CT image reconstructions of the cervical spine were also generated.  COMPARISON:  05/25/2015  FINDINGS: CT HEAD FINDINGS  Partial opacification of right mastoid air cells as before. Atherosclerotic and physiologic intracranial calcifications. Diffuse parenchymal atrophy. Prominence of lateral ventricles as before. Patchy areas of hypoattenuation in deep and periventricular white matter bilaterally. Negative for acute intracranial hemorrhage, mass lesion, acute infarction, midline shift, or mass-effect. Acute infarct may be inapparent on noncontrast CT. Ventricles and  sulci symmetric. Bone windows demonstrate no focal lesion.  CT CERVICAL SPINE FINDINGS  Normal alignment. No prevertebral soft tissue swelling. Facets are seated. Mild narrowing of the C2-3 and C5-6 interspaces. Fairly exuberant degenerative change at the atlantoaxial articulation. Anterior and posterior endplate spurring D1-S9. Negative for fracture. Bilateral carotid calcified plaque. Scarring in the visualized lung  apices. Calcification in the apical segment left upper lobe, incompletely visualized.  IMPRESSION: 1. Negative for bleed or other acute intracranial process. 2. Atrophy and nonspecific white matter changes as before. 3. Negative for cervical fracture or other acute bone abnormality. 4. Multilevel cervical degenerative changes as above.   Electronically Signed   By: Lucrezia Europe M.D.   On: 07/11/2015 18:01   Ct Cervical Spine Wo Contrast  07/11/2015   CLINICAL DATA:  Pt is from home, fell out of bed, right hip pain today, left sided temporal abrasion. Denies LOC, no blood thinners. No obvious deformity. Pt usually uses a walker at home, is unsteady on her feet. Pt also had a fall on Saturday  EXAM: CT HEAD WITHOUT CONTRAST  CT CERVICAL SPINE WITHOUT CONTRAST  TECHNIQUE: Multidetector CT imaging of the head and cervical spine was performed following the standard protocol without intravenous contrast. Multiplanar CT image reconstructions of the cervical spine were also generated.  COMPARISON:  05/25/2015  FINDINGS: CT HEAD FINDINGS  Partial opacification of right mastoid air cells as before. Atherosclerotic and physiologic intracranial calcifications. Diffuse parenchymal atrophy. Prominence of lateral ventricles as before. Patchy areas of hypoattenuation in deep and periventricular white matter bilaterally. Negative for acute intracranial hemorrhage, mass lesion, acute infarction, midline shift, or mass-effect. Acute infarct may be inapparent on noncontrast CT. Ventricles and sulci symmetric. Bone windows demonstrate no focal lesion.  CT CERVICAL SPINE FINDINGS  Normal alignment. No prevertebral soft tissue swelling. Facets are seated. Mild narrowing of the C2-3 and C5-6 interspaces. Fairly exuberant degenerative change at the atlantoaxial articulation. Anterior and posterior endplate spurring F0-Y6. Negative for fracture. Bilateral carotid calcified plaque. Scarring in the visualized lung apices. Calcification in the  apical segment left upper lobe, incompletely visualized.  IMPRESSION: 1. Negative for bleed or other acute intracranial process. 2. Atrophy and nonspecific white matter changes as before. 3. Negative for cervical fracture or other acute bone abnormality. 4. Multilevel cervical degenerative changes as above.   Electronically Signed   By: Lucrezia Europe M.D.   On: 07/11/2015 18:01   Dg Hip Unilat With Pelvis 2-3 Views Right  07/11/2015   CLINICAL DATA:  Right hip pain  EXAM: DG HIP (WITH OR WITHOUT PELVIS) 2-3V RIGHT  COMPARISON:  05/26/2015  FINDINGS: There is foreshortening of the right femoral neck which is suspicious for a subcapital femoral neck fracture. There is advanced osteoarthritis involving the right hip joint with near bone-on-bone contact. Marginal spur formation is noted. No dislocation.  IMPRESSION: 1. Findings are worrisome for impacted subcapital femoral neck fracture involving the right hip. Recommend confirmatory imaging with right hip CT or MRI. 2. Osteopenia. 3. Degenerative joint disease.   Electronically Signed   By: Kerby Moors M.D.   On: 07/11/2015 17:49    Assessment/Plan 1. Hyponatremia -Chronically low 128-130. Had been maintaining at 127-128.  Sodium 122 on recent lab mental status at baseline -fluid restriction of 1200cc/day -will give NS x 1 L  -sodium bicarb 650 mg daily -repeat bmp on 10/17   Sukhdeep Wieting K. Harle Battiest  [facility] [phone] 8 am - 5 pm) [phone] (after hours)

## 2015-08-04 LAB — BASIC METABOLIC PANEL
BUN: 18 mg/dL (ref 4–21)
CREATININE: 0.8 mg/dL (ref 0.5–1.1)
GLUCOSE: 72 mg/dL
POTASSIUM: 4.6 mmol/L (ref 3.4–5.3)
SODIUM: 125 mmol/L — AB (ref 137–147)

## 2015-08-16 ENCOUNTER — Non-Acute Institutional Stay (SKILLED_NURSING_FACILITY): Payer: Medicare Other | Admitting: Nurse Practitioner

## 2015-08-16 ENCOUNTER — Encounter: Payer: Self-pay | Admitting: Nurse Practitioner

## 2015-08-16 DIAGNOSIS — E43 Unspecified severe protein-calorie malnutrition: Secondary | ICD-10-CM | POA: Diagnosis not present

## 2015-08-16 DIAGNOSIS — F411 Generalized anxiety disorder: Secondary | ICD-10-CM

## 2015-08-16 DIAGNOSIS — I10 Essential (primary) hypertension: Secondary | ICD-10-CM

## 2015-08-16 DIAGNOSIS — I5032 Chronic diastolic (congestive) heart failure: Secondary | ICD-10-CM | POA: Diagnosis not present

## 2015-08-16 DIAGNOSIS — E871 Hypo-osmolality and hyponatremia: Secondary | ICD-10-CM

## 2015-08-16 DIAGNOSIS — K219 Gastro-esophageal reflux disease without esophagitis: Secondary | ICD-10-CM

## 2015-08-16 NOTE — Progress Notes (Signed)
Nursing Home Location:  [facility]   Place of Service: SNF ([phone])  PCP: Garret Reddish, MD  Allergies  Allergen Reactions  . Ciprofloxacin     emesis  . Paxil [Paroxetine Hcl] Nausea And Vomiting    Chief Complaint  Patient presents with  . Discharge Note    HPI:  Patient is a 81 y.o. female seen today at [facility] for discharge to SNF [facility]). She was admitted to [facility] after a hospital admission from 07/11/15-07/15/15 after a fall.  Femoral neck fracture was ruled out with CT, but she does have severe osteoarthritis in her right hip.   She has a past medical history of HTN, HL, CAD with bare metal stent to OM1, and GERD.  While here she has had chronic hyponatremia and was started on sodium bicarb 650mg  daily.   She has also had some aggressive behaviors reported from the staff.  She can answer questions appropriately, however sometimes becomes swiftly aggressive.  She was not aggressive today, she became tearful during the exam and says she wishes to go back to bed.  Review of Systems:  Review of Systems  Constitutional: Negative for fever, chills and fatigue.  HENT: Negative for congestion, rhinorrhea and sinus pressure.   Respiratory: Negative for cough, shortness of breath and wheezing.   Cardiovascular: Negative for chest pain, palpitations and leg swelling.  Gastrointestinal: Negative for diarrhea and constipation.  Genitourinary: Negative for dysuria, hematuria and flank pain.  Musculoskeletal: Positive for arthralgias and gait problem. Negative for myalgias.  Skin: Negative.   Neurological: Negative for dizziness, light-headedness and headaches.    Past Medical History  Diagnosis Date  . CARCINOID TUMOR 04/13/2007  . HYPERTENSION 04/13/2007  . ALLERGIC RHINITIS 04/13/2007  . GERD 04/13/2007  . HYPERGLYCEMIA 04/13/2007  . CHOLELITHIASIS 05/02/2010  . Arthritis   . Cancer ([place])   . Environmental allergies     has  mucus in throat  . Diphtheria     age 72  . CAD (coronary artery disease)     NSTEMI.  LHC 09/03/11: pLAD 40-50%, pDx 80-90% (very small), lateral branch of OM 99%, m-dCFX 40%, oRCA 80%, m-dRCA 50-70%, EF 55%.  She had PCI with BMS to lateral branch of the OM.  RCA was tx medically.   . Hyponatremia   . Murmur     echo 11/12: EF 55-60%, mod calcified AV annulus, mild AI, mild MR  . Dermatophytosis of nail   . UTI (urinary tract infection) 03/2015   Past Surgical History  Procedure Laterality Date  . Tonsillectomy      many years ago  . Cardiac surgery    . Colon surgery      CANCER  . Small bowel repair    . Left heart catheterization with coronary angiogram N/A 09/03/2011    Procedure: LEFT HEART CATHETERIZATION WITH CORONARY ANGIOGRAM;  Surgeon: Peter M Martinique, MD;  Location: [facility] CATH LAB;  Service: Cardiovascular;  Laterality: N/A;  . Percutaneous coronary stent intervention (pci-s)  09/03/2011    Procedure: PERCUTANEOUS CORONARY STENT INTERVENTION (PCI-S);  Surgeon: Peter M Martinique, MD;  Location: [facility] CATH LAB;  Service: Cardiovascular;;   Social History:   reports that she has never smoked. She has never used smokeless tobacco. She reports that she does not drink alcohol or use illicit drugs.  Family History  Problem Relation Age of Onset  . Hypertension Mother   . Uterine cancer Mother   .  Ovarian cancer Mother   . Hypertension Father   . Stroke Father   . Hypertension Brother   . Hyperlipidemia Brother   . Heart attack Brother   . Lung cancer Sister     Medications: Patient's Medications  New Prescriptions   No medications on file  Previous Medications   ACETAMINOPHEN (TYLENOL) 500 MG TABLET    Take 500 mg by mouth 2 (two) times daily.   ASPIRIN 81 MG TABLET    Take 81 mg by mouth daily.    ATORVASTATIN (LIPITOR) 80 MG TABLET    Take 80 mg by mouth once a week. Take on Mondays   BISACODYL (DULCOLAX) 10 MG SUPPOSITORY    Place 1 suppository (10 mg total) rectally  daily as needed for moderate constipation.   CALCIUM-VITAMIN D (OSCAL WITH D) 500-200 MG-UNIT PER TABLET    Take 1 tablet by mouth 2 (two) times daily.   DOCUSATE SODIUM (COLACE) 100 MG CAPSULE    Take 1 capsule (100 mg total) by mouth 2 (two) times daily.   METOPROLOL TARTRATE (LOPRESSOR) 50 MG TABLET    Take 1 tablet (50 mg total) by mouth 2 (two) times daily.   MULTIPLE VITAMIN (MULTI VITAMIN DAILY) TABS    Take 1 tablet by mouth daily. Pt. Not sure of dose.   NITROGLYCERIN (NITROSTAT) 0.4 MG SL TABLET    Place 1 tablet (0.4 mg total) under the tongue every 5 (five) minutes as needed for chest pain.   PANTOPRAZOLE (PROTONIX) 40 MG TABLET    Take 1 tablet (40 mg total) by mouth daily.   PREGABALIN (LYRICA) 50 MG CAPSULE    Take 50 mg by mouth 2 (two) times daily.   SENNA-DOCUSATE (SENOKOT-S) 8.6-50 MG PER TABLET    Take 1 tablet by mouth at bedtime as needed for mild constipation.   TRAMADOL (ULTRAM) 50 MG TABLET    Take 50 mg by mouth every 6 (six) hours as needed.   VALSARTAN (DIOVAN) 320 MG TABLET    Take 1 tablet (320 mg total) by mouth daily.  Modified Medications   No medications on file  Discontinued Medications   ACETAMINOPHEN (TYLENOL) 325 MG TABLET    Take 2 tablets (650 mg total) by mouth every 6 (six) hours as needed for mild pain (or Fever >/= 101).   HYDROCODONE-ACETAMINOPHEN (NORCO/VICODIN) 5-325 MG TABLET    Take 1 tablet by mouth every 6 (six) hours as needed for moderate pain (for pain 1-5, 2 by mouth every 6 hours as needed for pain 6-10).     Physical Exam: Filed Vitals:   08/16/15 1100  BP: 147/61  Pulse: 69  Temp: 97.3 F (36.3 C)  Resp: 18  SpO2: 95%    Physical Exam  Constitutional: She is oriented to person, place, and time. No distress.  Frail elderly female   HENT:  Head: Normocephalic and atraumatic.  Mouth/Throat: Oropharynx is clear and moist. No oropharyngeal exudate.  Eyes: Pupils are equal, round, and reactive to light.  Neck: Normal range of  motion. Neck supple.  Cardiovascular: Normal rate and regular rhythm.   Murmur heard. Pulmonary/Chest: Effort normal and breath sounds normal. She has no wheezes. She has no rales.  Abdominal: Soft. Bowel sounds are normal. There is no tenderness.  Musculoskeletal: She exhibits no edema or tenderness.  Lymphadenopathy:    She has no cervical adenopathy.  Neurological: She is alert and oriented to person, place, and time.  Skin: Skin is warm and dry. She is  not diaphoretic.  Psychiatric:  Patient is tearful.  Appears anxious.     Labs reviewed: Basic Metabolic Panel:  Recent Labs  05/30/15 0431  05/31/15 0342  06/01/15 0511  07/14/15 0710 07/14/15 1256 07/15/15 0559 07/24/15  NA 126*  < > 125*  < > 127*  < > 126* 128* 130* 128*  K 4.6  < > 4.6  < > 4.8  < > 4.4 4.5 4.5 5.1  CL 92*  < > 91*  < > 89*  < > 97* 100* 94*  --   CO2 25  < > 25  < > 27  < > 25 25 29   --   GLUCOSE 113*  < > 101*  < > 92  < > 100* 105* 95  --   BUN 13  < > 19  < > 22*  < > 9 8 7 19   CREATININE 0.96  < > 1.01*  < > 0.99  < > 0.82 0.81 0.84 1.0  CALCIUM 8.8*  < > 8.6*  < > 9.0  < > 8.7* 8.9 9.5  --   MG 1.7  --  1.8  --  1.8  --   --   --   --   --   < > = values in this interval not displayed. Liver Function Tests:  Recent Labs  05/31/15 0342 06/01/15 0511 07/11/15 1716 07/24/15  AST 21 23 29 16   ALT 15 18 21 18   ALKPHOS 93 89 102 112  BILITOT 0.8 0.9 0.7  --   PROT 6.4* 6.4* 7.3  --   ALBUMIN 2.4* 2.4* 3.4*  --     Recent Labs  05/25/15 1743  LIPASE 35   No results for input(s): AMMONIA in the last 8760 hours. CBC:  Recent Labs  05/31/15 0342 06/01/15 0511 07/11/15 1716 07/12/15 0532 07/13/15 0624 07/14/15 0710 07/24/15  WBC 10.3 7.6 5.3 4.3 4.2 4.2 5.6  NEUTROABS 7.4 5.3 4.2  --   --   --   --   HGB 16.4* 16.4* 17.5* 17.0* 15.7* 15.2* 16.2*  HCT 46.7* 46.7* 49.6* 48.1* 45.5 44.5 48*  MCV 90.0 91.0 91.2 91.1 93.2 93.9  --   PLT 403* 397 426* 358 367 325 489*    TSH:  Recent Labs  02/17/15 1229 05/30/15 0937  TSH 5.33* 3.697   A1C: Lab Results  Component Value Date   HGBA1C 6.0 12/02/2008   Lipid Panel:  Recent Labs  10/05/14 0836  CHOL 138  HDL 59.40  LDLCALC 64  TRIG 72.0  CHOLHDL 2    Radiological Exams: Dg Chest 2 View  07/11/2015  CLINICAL DATA:  Fall from bed.  Right hip pain. EXAM: CHEST  2 VIEW COMPARISON:  05/29/2015 FINDINGS: The heart size and mediastinal contours are within normal limits. Both lungs are clear. The bones appear diffusely osteopenic. There is no acute fracture or subluxation identified. IMPRESSION: No active cardiopulmonary disease. Electronically Signed   By: Kerby Moors M.D.   On: 07/11/2015 17:52   Ct Head Wo Contrast  07/11/2015  CLINICAL DATA:  Pt is from home, fell out of bed, right hip pain today, left sided temporal abrasion. Denies LOC, no blood thinners. No obvious deformity. Pt usually uses a walker at home, is unsteady on her feet. Pt also had a fall on Saturday EXAM: CT HEAD WITHOUT CONTRAST CT CERVICAL SPINE WITHOUT CONTRAST TECHNIQUE: Multidetector CT imaging of the head and cervical spine was performed following the  standard protocol without intravenous contrast. Multiplanar CT image reconstructions of the cervical spine were also generated. COMPARISON:  05/25/2015 FINDINGS: CT HEAD FINDINGS Partial opacification of right mastoid air cells as before. Atherosclerotic and physiologic intracranial calcifications. Diffuse parenchymal atrophy. Prominence of lateral ventricles as before. Patchy areas of hypoattenuation in deep and periventricular white matter bilaterally. Negative for acute intracranial hemorrhage, mass lesion, acute infarction, midline shift, or mass-effect. Acute infarct may be inapparent on noncontrast CT. Ventricles and sulci symmetric. Bone windows demonstrate no focal lesion. CT CERVICAL SPINE FINDINGS Normal alignment. No prevertebral soft tissue swelling. Facets are seated.  Mild narrowing of the C2-3 and C5-6 interspaces. Fairly exuberant degenerative change at the atlantoaxial articulation. Anterior and posterior endplate spurring F0-X3. Negative for fracture. Bilateral carotid calcified plaque. Scarring in the visualized lung apices. Calcification in the apical segment left upper lobe, incompletely visualized. IMPRESSION: 1. Negative for bleed or other acute intracranial process. 2. Atrophy and nonspecific white matter changes as before. 3. Negative for cervical fracture or other acute bone abnormality. 4. Multilevel cervical degenerative changes as above. Electronically Signed   By: Lucrezia Europe M.D.   On: 07/11/2015 18:01   Ct Cervical Spine Wo Contrast  07/11/2015  CLINICAL DATA:  Pt is from home, fell out of bed, right hip pain today, left sided temporal abrasion. Denies LOC, no blood thinners. No obvious deformity. Pt usually uses a walker at home, is unsteady on her feet. Pt also had a fall on Saturday EXAM: CT HEAD WITHOUT CONTRAST CT CERVICAL SPINE WITHOUT CONTRAST TECHNIQUE: Multidetector CT imaging of the head and cervical spine was performed following the standard protocol without intravenous contrast. Multiplanar CT image reconstructions of the cervical spine were also generated. COMPARISON:  05/25/2015 FINDINGS: CT HEAD FINDINGS Partial opacification of right mastoid air cells as before. Atherosclerotic and physiologic intracranial calcifications. Diffuse parenchymal atrophy. Prominence of lateral ventricles as before. Patchy areas of hypoattenuation in deep and periventricular white matter bilaterally. Negative for acute intracranial hemorrhage, mass lesion, acute infarction, midline shift, or mass-effect. Acute infarct may be inapparent on noncontrast CT. Ventricles and sulci symmetric. Bone windows demonstrate no focal lesion. CT CERVICAL SPINE FINDINGS Normal alignment. No prevertebral soft tissue swelling. Facets are seated. Mild narrowing of the C2-3 and C5-6  interspaces. Fairly exuberant degenerative change at the atlantoaxial articulation. Anterior and posterior endplate spurring A3-F5. Negative for fracture. Bilateral carotid calcified plaque. Scarring in the visualized lung apices. Calcification in the apical segment left upper lobe, incompletely visualized. IMPRESSION: 1. Negative for bleed or other acute intracranial process. 2. Atrophy and nonspecific white matter changes as before. 3. Negative for cervical fracture or other acute bone abnormality. 4. Multilevel cervical degenerative changes as above. Electronically Signed   By: Lucrezia Europe M.D.   On: 07/11/2015 18:01   Dg Hip Unilat With Pelvis 2-3 Views Right  07/11/2015  CLINICAL DATA:  Right hip pain EXAM: DG HIP (WITH OR WITHOUT PELVIS) 2-3V RIGHT COMPARISON:  05/26/2015 FINDINGS: There is foreshortening of the right femoral neck which is suspicious for a subcapital femoral neck fracture. There is advanced osteoarthritis involving the right hip joint with near bone-on-bone contact. Marginal spur formation is noted. No dislocation. IMPRESSION: 1. Findings are worrisome for impacted subcapital femoral neck fracture involving the right hip. Recommend confirmatory imaging with right hip CT or MRI. 2. Osteopenia. 3. Degenerative joint disease. Electronically Signed   By: Kerby Moors M.D.   On: 07/11/2015 17:49    Assessment/Plan 1. Chronic diastolic CHF (congestive heart failure) (  [place]) Stable. No JVD, no SOB. Continue metoprolol and valsartan, patient has history of CAD.    2. Essential hypertension Stable. Blood pressures within desirable range. Continue valsartan. Renal function within normal limits.   3. Gastroesophageal reflux disease, esophagitis presence not specified Stable. Continue Protonix for symptom control.    4. Hyponatremia Ongoing.  Pt has been started on sodium bicarb, last Na was 125 and Patient will need follow BMP to reassess.   5. Anxiety state Tearful today, if persists,  may benefit from lorazepam.    6. Protein-calorie malnutrition, severe ([place]) Will need to be monitored for weight loss. She on a regular diet.    pt is stable for discharge to SNF  Shaunte Weissinger K. Harle Battiest  [facility] [phone] 8 am - 5 pm) [phone] (after hours)

## 2015-08-18 ENCOUNTER — Telehealth: Payer: Self-pay

## 2015-08-18 MED ORDER — PREGABALIN 50 MG PO CAPS: 50.0000 mg | ORAL_CAPSULE | Freq: Two times a day (BID) | ORAL | Status: DC

## 2015-08-18 NOTE — Telephone Encounter (Signed)
Becky Morales refill request

## 2015-09-11 ENCOUNTER — Encounter: Payer: Self-pay | Admitting: Nurse Practitioner

## 2015-09-11 ENCOUNTER — Non-Acute Institutional Stay (SKILLED_NURSING_FACILITY): Payer: Medicare Other | Admitting: Nurse Practitioner

## 2015-09-11 DIAGNOSIS — M79605 Pain in left leg: Secondary | ICD-10-CM

## 2015-09-11 DIAGNOSIS — E871 Hypo-osmolality and hyponatremia: Secondary | ICD-10-CM | POA: Diagnosis not present

## 2015-09-11 DIAGNOSIS — M79604 Pain in right leg: Secondary | ICD-10-CM | POA: Diagnosis not present

## 2015-09-11 DIAGNOSIS — I10 Essential (primary) hypertension: Secondary | ICD-10-CM

## 2015-09-11 DIAGNOSIS — I5032 Chronic diastolic (congestive) heart failure: Secondary | ICD-10-CM

## 2015-09-11 NOTE — Progress Notes (Signed)
Patient ID: Becky Morales, female   DOB: 1920/01/30, 81 y.o.   MRN: [id number]    Nursing Home Location:  [place] of Service: SNF ([phone])  PCP: Garret Reddish, MD  Allergies  Allergen Reactions  . Ciprofloxacin     emesis  . Paxil [Paroxetine Hcl] Nausea And Vomiting    Chief Complaint  Patient presents with  . Acute Visit    Acute concerns     HPI:  Patient is a 81 y.o. female seen today at [facility] for follow up on chronic conditions. Pt was supposed to discharge to  SNF but has not done so yet and unsure when this will occur. She has a past medical history of HTN, HL, CAD, and GERD. While at [facility] she has had chronic hyponatremia and was started on sodium bicarb 650mg  daily.pt reports increase pain to lower extremities. Pt with chronic pain in legs and being treated with lyrica. Blood pressure has been noted to be up over the last few weeks.   Review of Systems:  Review of Systems  Constitutional: Negative for fever, chills and fatigue.  HENT: Negative for congestion, rhinorrhea and sinus pressure.   Respiratory: Negative for cough, shortness of breath and wheezing.   Cardiovascular: Negative for chest pain and palpitations.  Gastrointestinal: Negative for diarrhea and constipation.  Genitourinary: Negative for dysuria, hematuria and flank pain.  Musculoskeletal: Positive for arthralgias and gait problem. Negative for myalgias.  Skin: Negative.   Neurological: Negative for dizziness, light-headedness and headaches.  Psychiatric/Behavioral:       Memory loss noted    Past Medical History  Diagnosis Date  . CARCINOID TUMOR 04/13/2007  . HYPERTENSION 04/13/2007  . ALLERGIC RHINITIS 04/13/2007  . GERD 04/13/2007  . HYPERGLYCEMIA 04/13/2007  . CHOLELITHIASIS 05/02/2010  . Arthritis   . Cancer ([place])   . Environmental allergies     has mucus in throat  . Diphtheria     age 34  . CAD (coronary artery disease)     NSTEMI.   LHC 09/03/11: pLAD 40-50%, pDx 80-90% (very small), lateral branch of OM 99%, m-dCFX 40%, oRCA 80%, m-dRCA 50-70%, EF 55%.  She had PCI with BMS to lateral branch of the OM.  RCA was tx medically.   . Hyponatremia   . Murmur     echo 11/12: EF 55-60%, mod calcified AV annulus, mild AI, mild MR  . Dermatophytosis of nail   . UTI (urinary tract infection) 03/2015   Past Surgical History  Procedure Laterality Date  . Tonsillectomy      many years ago  . Cardiac surgery    . Colon surgery      CANCER  . Small bowel repair    . Left heart catheterization with coronary angiogram N/A 09/03/2011    Procedure: LEFT HEART CATHETERIZATION WITH CORONARY ANGIOGRAM;  Surgeon: Peter M Martinique, MD;  Location: [facility] CATH LAB;  Service: Cardiovascular;  Laterality: N/A;  . Percutaneous coronary stent intervention (pci-s)  09/03/2011    Procedure: PERCUTANEOUS CORONARY STENT INTERVENTION (PCI-S);  Surgeon: Peter M Martinique, MD;  Location: [facility] CATH LAB;  Service: Cardiovascular;;   Social History:   reports that she has never smoked. She has never used smokeless tobacco. She reports that she does not drink alcohol or use illicit drugs.  Family History  Problem Relation Age of Onset  . Hypertension Mother   . Uterine cancer Mother   . Ovarian cancer Mother   .  Hypertension Father   . Stroke Father   . Hypertension Brother   . Hyperlipidemia Brother   . Heart attack Brother   . Lung cancer Sister     Medications: Patient's Medications  New Prescriptions   No medications on file  Previous Medications   ACETAMINOPHEN (TYLENOL) 500 MG TABLET    Take 1,000 mg by mouth 2 (two) times daily.    ASPIRIN 81 MG TABLET    Take 81 mg by mouth daily.    ATORVASTATIN (LIPITOR) 80 MG TABLET    Take 80 mg by mouth once a week. Take on Mondays   BISACODYL (DULCOLAX) 10 MG SUPPOSITORY    Place 1 suppository (10 mg total) rectally daily as needed for moderate constipation.   CALCIUM-VITAMIN D (OSCAL WITH D) 500-200  MG-UNIT PER TABLET    Take 1 tablet by mouth 2 (two) times daily.   DOCUSATE SODIUM (COLACE) 100 MG CAPSULE    Take 1 capsule (100 mg total) by mouth 2 (two) times daily.   METOPROLOL TARTRATE (LOPRESSOR) 50 MG TABLET    Take 1 tablet (50 mg total) by mouth 2 (two) times daily.   MULTIPLE VITAMIN (MULTI VITAMIN DAILY) TABS    Take 1 tablet by mouth daily. Pt. Not sure of dose.   NITROGLYCERIN (NITROSTAT) 0.4 MG SL TABLET    Place 1 tablet (0.4 mg total) under the tongue every 5 (five) minutes as needed for chest pain.   PANTOPRAZOLE (PROTONIX) 40 MG TABLET    Take 1 tablet (40 mg total) by mouth daily.   PREGABALIN (LYRICA) 50 MG CAPSULE    Take 1 capsule (50 mg total) by mouth 2 (two) times daily.   SENNA-DOCUSATE (SENOKOT-S) 8.6-50 MG PER TABLET    Take 1 tablet by mouth at bedtime as needed for mild constipation.   SODIUM CHLORIDE 1 G TABLET    Take 1 g by mouth 2 (two) times daily with a meal.   TRAMADOL (ULTRAM) 50 MG TABLET    1 by mouth for pain 2-5/10 every 6 hours , 2 by mouth for pain 6-10/10 every 6 hours as needed   VALSARTAN (DIOVAN) 320 MG TABLET    Take 1 tablet (320 mg total) by mouth daily.  Modified Medications   No medications on file  Discontinued Medications   No medications on file     Physical Exam: Filed Vitals:   09/11/15 1128  BP: 163/72  Pulse: 70  Temp: 97.7 F (36.5 C)  TempSrc: Oral  Resp: 20  Height: 5\' 7"  (1.702 m)  Weight: 117 lb (53.071 kg)  SpO2: 96%    Physical Exam  Constitutional: She is oriented to person, place, and time. She appears well-developed and well-nourished. No distress.  HENT:  Head: Normocephalic and atraumatic.  Mouth/Throat: Oropharynx is clear and moist. No oropharyngeal exudate.  Eyes: Conjunctivae are normal. Pupils are equal, round, and reactive to light.  Neck: Normal range of motion. Neck supple.  Cardiovascular: Normal rate, regular rhythm and normal heart sounds.   Pulmonary/Chest: Effort normal and breath sounds  normal.  Abdominal: Soft. Bowel sounds are normal.  Musculoskeletal: She exhibits edema (trace edema bilaterally). She exhibits no tenderness.  Neurological: She is alert and oriented to person, place, and time.  Skin: Skin is warm and dry. She is not diaphoretic.  Psychiatric: She has a normal mood and affect.    Labs reviewed: Basic Metabolic Panel:  Recent Labs  05/30/15 0431  05/31/15 0342  06/01/15 [id number]  07/14/15 0710 07/14/15 1256 07/15/15 0559 07/24/15 08/04/15  NA 126*  < > 125*  < > 127*  < > 126* 128* 130* 128* 125*  K 4.6  < > 4.6  < > 4.8  < > 4.4 4.5 4.5 5.1 4.6  CL 92*  < > 91*  < > 89*  < > 97* 100* 94*  --   --   CO2 25  < > 25  < > 27  < > 25 25 29   --   --   GLUCOSE 113*  < > 101*  < > 92  < > 100* 105* 95  --   --   BUN 13  < > 19  < > 22*  < > 9 8 7 19 18   CREATININE 0.96  < > 1.01*  < > 0.99  < > 0.82 0.81 0.84 1.0 0.8  CALCIUM 8.8*  < > 8.6*  < > 9.0  < > 8.7* 8.9 9.5  --   --   MG 1.7  --  1.8  --  1.8  --   --   --   --   --   --   < > = values in this interval not displayed. Liver Function Tests:  Recent Labs  05/31/15 0342 06/01/15 0511 07/11/15 1716 07/24/15  AST 21 23 29 16   ALT 15 18 21 18   ALKPHOS 93 89 102 112  BILITOT 0.8 0.9 0.7  --   PROT 6.4* 6.4* 7.3  --   ALBUMIN 2.4* 2.4* 3.4*  --     Recent Labs  05/25/15 1743  LIPASE 35   No results for input(s): AMMONIA in the last 8760 hours. CBC:  Recent Labs  05/31/15 0342 06/01/15 0511 07/11/15 1716 07/12/15 0532 07/13/15 0624 07/14/15 0710 07/24/15  WBC 10.3 7.6 5.3 4.3 4.2 4.2 5.6  NEUTROABS 7.4 5.3 4.2  --   --   --   --   HGB 16.4* 16.4* 17.5* 17.0* 15.7* 15.2* 16.2*  HCT 46.7* 46.7* 49.6* 48.1* 45.5 44.5 48*  MCV 90.0 91.0 91.2 91.1 93.2 93.9  --   PLT 403* 397 426* 358 367 325 489*   TSH:  Recent Labs  02/17/15 1229 05/30/15 0937  TSH 5.33* 3.697   A1C: Lab Results  Component Value Date   HGBA1C 6.0 12/02/2008   Lipid Panel:  Recent Labs   10/05/14 0836  CHOL 138  HDL 59.40  LDLCALC 64  TRIG 72.0  CHOLHDL 2    Assessment/Plan 1. Hyponatremia -cont on sodium bicarb, will follow up BMP  2. Leg pain, bilateral -will increase lyrica to 50 mg TID, to cont ultram as needed for pain  3. Essential hypertension Elevated blood pressure, conts on valsartan 320 mg daily and metoprolol tartrate 50 mg BID. Will start norvasc 5 mg daily and have staff monitor blood pressure  4. Chronic diastolic CHF (congestive heart failure) (H[facility]) CHF remains stable, no signs of worsening chf, conts on metoprolol tartrate BID    Taran Hable K. Harle Battiest  [facility] [phone] 8 am - 5 pm) [phone] (after hours)

## 2015-09-12 ENCOUNTER — Other Ambulatory Visit: Payer: Self-pay | Admitting: *Deleted

## 2015-09-12 MED ORDER — PREGABALIN 50 MG PO CAPS: 50.0000 mg | ORAL_CAPSULE | Freq: Three times a day (TID) | ORAL | Status: DC

## 2015-09-25 LAB — BASIC METABOLIC PANEL
BUN: 10 mg/dL (ref 4–21)
CREATININE: 0.6 mg/dL (ref 0.5–1.1)
Glucose: 83 mg/dL
Potassium: 4.7 mmol/L (ref 3.4–5.3)
Sodium: 125 mmol/L — AB (ref 137–147)

## 2015-10-10 LAB — BASIC METABOLIC PANEL
BUN: 12 mg/dL (ref 4–21)
Creatinine: 0.7 mg/dL (ref 0.5–1.1)
Glucose: 75 mg/dL
Potassium: 5 mmol/L (ref 3.4–5.3)
Sodium: 127 mmol/L — AB (ref 137–147)

## 2015-10-17 ENCOUNTER — Other Ambulatory Visit: Payer: Self-pay | Admitting: *Deleted

## 2015-10-17 MED ORDER — PREGABALIN 50 MG PO CAPS: ORAL_CAPSULE | ORAL | Status: DC

## 2015-10-17 NOTE — Telephone Encounter (Signed)
[facility] 

## 2015-10-25 ENCOUNTER — Non-Acute Institutional Stay (SKILLED_NURSING_FACILITY): Payer: Medicare Other | Admitting: Nurse Practitioner

## 2015-10-25 DIAGNOSIS — E871 Hypo-osmolality and hyponatremia: Secondary | ICD-10-CM

## 2015-10-25 DIAGNOSIS — I5032 Chronic diastolic (congestive) heart failure: Secondary | ICD-10-CM | POA: Diagnosis not present

## 2015-10-25 DIAGNOSIS — E43 Unspecified severe protein-calorie malnutrition: Secondary | ICD-10-CM | POA: Diagnosis not present

## 2015-10-25 DIAGNOSIS — I10 Essential (primary) hypertension: Secondary | ICD-10-CM

## 2015-10-25 DIAGNOSIS — I25811 Atherosclerosis of native coronary artery of transplanted heart without angina pectoris: Secondary | ICD-10-CM | POA: Diagnosis not present

## 2015-10-25 DIAGNOSIS — I25769 Atherosclerosis of bypass graft of coronary artery of transplanted heart with unspecified angina pectoris: Secondary | ICD-10-CM

## 2015-10-25 NOTE — Progress Notes (Signed)
Patient ID: Becky Morales, female   DOB: [birth year], 82 y.o.   MRN: [id number]    Nursing Home Location:  [place] of Service: SNF [phone])  PCP: Garret Reddish, MD  Allergies  Allergen Reactions  . Ciprofloxacin     emesis  . Paxil [Paroxetine Hcl] Nausea And Vomiting    Chief Complaint  Patient presents with  . Medical Management of Chronic Issues    HPI:  Patient is a 82 y.o. female seen today at [facility] for follow up on chronic conditions. \\ She has a past medical history of hyponatremia, HTN, HL, CAD, and GERD. Pt with noted weight loss and wound on buttocks. RD and wound care following pt. Memory loss with gradual progression noted. Pt has not complaints at this time. Denies pain, trouble swallowing, GERD, constipation, shortness of breath or chest pains.  Staff without acute concerns at this time.   Review of Systems:  Review of Systems  Constitutional: Negative for fever, chills and fatigue.  HENT: Negative for congestion, rhinorrhea and sinus pressure.   Respiratory: Negative for cough, shortness of breath and wheezing.   Cardiovascular: Negative for chest pain and palpitations.  Gastrointestinal: Negative for diarrhea and constipation.  Genitourinary: Negative for dysuria, hematuria and flank pain.  Musculoskeletal: Positive for arthralgias and gait problem. Negative for myalgias.  Skin: Negative.   Neurological: Negative for dizziness, light-headedness and headaches.  Psychiatric/Behavioral:       Memory loss noted    Past Medical History  Diagnosis Date  . CARCINOID TUMOR 04/13/2007  . HYPERTENSION 04/13/2007  . ALLERGIC RHINITIS 04/13/2007  . GERD 04/13/2007  . HYPERGLYCEMIA 04/13/2007  . CHOLELITHIASIS 05/02/2010  . Arthritis   . Cancer ([place])   . Environmental allergies     has mucus in throat  . Diphtheria     age 25  . CAD (coronary artery disease)     NSTEMI.  LHC 09/03/11: pLAD 40-50%, pDx 80-90% (very  small), lateral branch of OM 99%, m-dCFX 40%, oRCA 80%, m-dRCA 50-70%, EF 55%.  She had PCI with BMS to lateral branch of the OM.  RCA was tx medically.   . Hyponatremia   . Murmur     echo 11/12: EF 55-60%, mod calcified AV annulus, mild AI, mild MR  . Dermatophytosis of nail   . UTI (urinary tract infection) 03/2015   Past Surgical History  Procedure Laterality Date  . Tonsillectomy      many years ago  . Cardiac surgery    . Colon surgery      CANCER  . Small bowel repair    . Left heart catheterization with coronary angiogram N/A 09/03/2011    Procedure: LEFT HEART CATHETERIZATION WITH CORONARY ANGIOGRAM;  Surgeon: Peter M Martinique, MD;  Location: [facility] CATH LAB;  Service: Cardiovascular;  Laterality: N/A;  . Percutaneous coronary stent intervention (pci-s)  09/03/2011    Procedure: PERCUTANEOUS CORONARY STENT INTERVENTION (PCI-S);  Surgeon: Peter M Martinique, MD;  Location: [facility] C[facility] LAB;  Service: Cardiovascular;;   Social History:   reports that she has never smoked. She has never used smokeless tobacco. She reports that she does not drink alcohol or use illicit drugs.  Family History  Problem Relation Age of Onset  . Hypertension Mother   . Uterine cancer Mother   . Ovarian cancer Mother   . Hypertension Father   . Stroke Father   . Hypertension Brother   . Hyperlipidemia Brother   .  Heart attack Brother   . Lung cancer Sister     Medications: Patient's Medications  New Prescriptions   No medications on file  Previous Medications   ACETAMINOPHEN (TYLENOL) 500 MG TABLET    Take 1,000 mg by mouth 2 (two) times daily.    AMLODIPINE (NORVASC) 5 MG TABLET    Take 5 mg by mouth daily.   ASPIRIN 81 MG TABLET    Take 81 mg by mouth daily.    ATORVASTATIN (LIPITOR) 80 MG TABLET    Take 80 mg by mouth once a week. Take on Mondays   BISACODYL (DULCOLAX) 10 MG SUPPOSITORY    Place 1 suppository (10 mg total) rectally daily as needed for moderate constipation.   CALCIUM-VITAMIN D (OSCAL  WITH D) 500-200 MG-UNIT PER TABLET    Take 1 tablet by mouth 2 (two) times daily.   DOCUSATE SODIUM (COLACE) 100 MG CAPSULE    Take 1 capsule (100 mg total) by mouth 2 (two) times daily.   METOPROLOL TARTRATE (LOPRESSOR) 50 MG TABLET    Take 1 tablet (50 mg total) by mouth 2 (two) times daily.   MULTIPLE VITAMIN (MULTI VITAMIN DAILY) TABS    Take 1 tablet by mouth daily. Pt. Not sure of dose.   NITROGLYCERIN (NITROSTAT) 0.4 MG SL TABLET    Place 1 tablet (0.4 mg total) under the tongue every 5 (five) minutes as needed for chest pain.   PANTOPRAZOLE (PROTONIX) 40 MG TABLET    Take 1 tablet (40 mg total) by mouth daily.   PREGABALIN (LYRICA) 50 MG CAPSULE    Take one capsule by mouth three times daily for neuropathy   SENNA-DOCUSATE (SENOKOT-S) 8.6-50 MG PER TABLET    Take 1 tablet by mouth at bedtime as needed for mild constipation.   SODIUM CHLORIDE 1 G TABLET    Take 1 g by mouth 2 (two) times daily with a meal.   TRAMADOL (ULTRAM) 50 MG TABLET    1 by mouth for pain 2-5/10 every 6 hours , 2 by mouth for pain 6-10/10 every 6 hours as needed   VALSARTAN (DIOVAN) 320 MG TABLET    Take 1 tablet (320 mg total) by mouth daily.  Modified Medications   No medications on file  Discontinued Medications   No medications on file     Physical Exam: Filed Vitals:   10/25/15 1547  BP: 149/78  Pulse: 63  Temp: 97.8 F (36.6 C)  Resp: 20  Weight: 113 lb 1.6 oz (51.302 kg)    Physical Exam  Constitutional: She is oriented to person, place, and time. She appears well-developed and well-nourished. No distress.  HENT:  Head: Normocephalic and atraumatic.  Mouth/Throat: Oropharynx is clear and moist. No oropharyngeal exudate.  Eyes: Conjunctivae are normal. Pupils are equal, round, and reactive to light.  Neck: Normal range of motion. Neck supple.  Cardiovascular: Normal rate, regular rhythm and normal heart sounds.   Pulmonary/Chest: Effort normal and breath sounds normal.  Abdominal: Soft. Bowel  sounds are normal.  Musculoskeletal: She exhibits edema (trace edema bilaterally). She exhibits no tenderness.  Neurological: She is alert and oriented to person, place, and time.  Skin: Skin is warm and dry. She is not diaphoretic.  Psychiatric: She has a normal mood and affect.    Labs reviewed: Basic Metabolic Panel:  Recent Labs  05/30/15 0431  05/31/15 0342  06/01/15 0511  07/14/15 0710 07/14/15 1256 07/15/15 0559 07/24/15 08/04/15 09/25/15  NA 126*  < > 125*  < >  127*  < > 126* 128* 130* 128* 125* 125*  K 4.6  < > 4.6  < > 4.8  < > 4.4 4.5 4.5 5.1 4.6 4.7  CL 92*  < > 91*  < > 89*  < > 97* 100* 94*  --   --   --   CO2 25  < > 25  < > 27  < > 25 25 29   --   --   --   GLUCOSE 113*  < > 101*  < > 92  < > 100* 105* 95  --   --   --   BUN 13  < > 19  < > 22*  < > 9 8 7 19 18 10   CREATININE 0.96  < > 1.01*  < > 0.99  < > 0.82 0.81 0.84 1.0 0.8 0.6  CALCIUM 8.8*  < > 8.6*  < > 9.0  < > 8.7* 8.9 9.5  --   --   --   MG 1.7  --  1.8  --  1.8  --   --   --   --   --   --   --   < > = values in this interval not displayed. Liver Function Tests:  Recent Labs  05/31/15 0342 06/01/15 0511 07/11/15 1716 07/24/15  AST 21 23 29 16   ALT 15 18 21 18   ALKPHOS 93 89 102 112  BILITOT 0.8 0.9 0.7  --   PROT 6.4* 6.4* 7.3  --   ALBUMIN 2.4* 2.4* 3.4*  --     Recent Labs  05/25/15 1743  LIPASE 35   No results for input(s): AMMONIA in the last 8760 hours. CBC:  Recent Labs  05/31/15 0342 06/01/15 0511 07/11/15 1716 07/12/15 0532 07/13/15 0624 07/14/15 0710 07/24/15  WBC 10.3 7.6 5.3 4.3 4.2 4.2 5.6  NEUTROABS 7.4 5.3 4.2  --   --   --   --   HGB 16.4* 16.4* 17.5* 17.0* 15.7* 15.2* 16.2*  HCT 46.7* 46.7* 49.6* 48.1* 45.5 44.5 48*  MCV 90.0 91.0 91.2 91.1 93.2 93.9  --   PLT 403* 397 426* 358 367 325 489*   TSH:  Recent Labs  02/17/15 1229 05/30/15 0937  TSH 5.33* 3.697   A1C: Lab Results  Component Value Date   HGBA1C 6.0 12/02/2008   Lipid Panel: No results  for input(s): CHOL, HDL, LDLCALC, TRIG, CHOLHDL, LDLDIRECT in the last 8760 hours.  Assessment/Plan 1. Essential hypertension Blood pressure reviewed and remains stable, will cont lopressor, valsartan, and norvasc  2. Chronic diastolic CHF (congestive heart failure) (HCC) Stable, euvolemic, conts on lopressor and valsartan  3. Protein-calorie malnutrition, severe ([place]) Ongoing weight loss noted, RD following fortified food added  4. Hyponatremia -pt current talking sodium chloride 1 gm BID, last Na was 125 in dec and remained stable, will follow up bmp at this time.   5. CAD (coronary artery disease)  s/p  PCI for NSTEMI. with angina.  Without chest pains, conts on ASA daily and statin.     Carlos American. Harle Battiest  [facility] [phone] 8 am - 5 pm) [phone] (after hours)

## 2015-10-26 LAB — CBC AND DIFFERENTIAL
HCT: 43 % (ref 36–46)
Hemoglobin: 14.8 g/dL (ref 12.0–16.0)
PLATELETS: 617 10*3/uL — AB (ref 150–399)
WBC: 6 10^3/mL

## 2015-10-26 LAB — HEPATIC FUNCTION PANEL
ALK PHOS: 98 U/L (ref 25–125)
ALT: 29 U/L (ref 7–35)
AST: 27 U/L (ref 13–35)
Bilirubin, Total: 0.6 mg/dL

## 2015-10-26 LAB — LIPID PANEL
Cholesterol: 143 mg/dL (ref 0–200)
HDL: 54 mg/dL (ref 35–70)
LDL CALC: 77 mg/dL
Triglycerides: 63 mg/dL (ref 40–160)

## 2015-10-26 LAB — BASIC METABOLIC PANEL
BUN: 13 mg/dL (ref 4–21)
Creatinine: 0.6 mg/dL (ref 0.5–1.1)
GLUCOSE: 102 mg/dL
POTASSIUM: 4.8 mmol/L (ref 3.4–5.3)
Sodium: 125 mmol/L — AB (ref 137–147)

## 2015-11-15 LAB — HM DIABETES EYE EXAM

## 2015-11-22 ENCOUNTER — Encounter: Payer: Self-pay | Admitting: Nurse Practitioner

## 2015-11-22 ENCOUNTER — Non-Acute Institutional Stay (SKILLED_NURSING_FACILITY): Payer: Medicare Other | Admitting: Nurse Practitioner

## 2015-11-22 DIAGNOSIS — E785 Hyperlipidemia, unspecified: Secondary | ICD-10-CM

## 2015-11-22 DIAGNOSIS — I10 Essential (primary) hypertension: Secondary | ICD-10-CM | POA: Diagnosis not present

## 2015-11-22 DIAGNOSIS — E871 Hypo-osmolality and hyponatremia: Secondary | ICD-10-CM | POA: Diagnosis not present

## 2015-11-22 DIAGNOSIS — E43 Unspecified severe protein-calorie malnutrition: Secondary | ICD-10-CM

## 2015-11-22 DIAGNOSIS — I5032 Chronic diastolic (congestive) heart failure: Secondary | ICD-10-CM

## 2015-11-22 NOTE — Progress Notes (Signed)
Patient ID: GWENYTH DUGGAN, female   DOB: [birth year], 82 y.o.   MRN: [id number]    Nursing Home Location:  [place] of Service: SNF ([phone])  PCP: Garret Reddish, MD  Allergies  Allergen Reactions  . Ciprofloxacin     emesis  . Paxil [Paroxetine Hcl] Nausea And Vomiting    Chief Complaint  Patient presents with  . Medical Management of Chronic Issues    Routine Visit    HPI:  Patient is a 82 y.o. female seen today at [facility] for follow up on chronic conditions.  She has a past medical history of hyponatremia, HTN, HL, CAD, and GERD. Memory loss with gradual progression noted no recent BIMS or MMSE noted on chart. Staff reports pt does not want to get out of bed. Resist when they attempt. Ongoing weight loss with RD following and supplements added.   Review of Systems:  Review of Systems  Constitutional: Negative for fever, chills and fatigue.  HENT: Negative for congestion, rhinorrhea and sinus pressure.   Respiratory: Negative for cough, shortness of breath and wheezing.   Cardiovascular: Negative for chest pain and palpitations.  Gastrointestinal: Negative for diarrhea and constipation.  Genitourinary: Negative for dysuria, hematuria and flank pain.  Musculoskeletal: Positive for arthralgias and gait problem. Negative for myalgias.  Skin: Negative.   Neurological: Negative for dizziness, light-headedness and headaches.  Psychiatric/Behavioral:       Memory loss noted    Past Medical History  Diagnosis Date  . CARCINOID TUMOR 04/13/2007  . HYPERTENSION 04/13/2007  . ALLERGIC RHINITIS 04/13/2007  . GERD 04/13/2007  . HYPERGLYCEMIA 04/13/2007  . CHOLELITHIASIS 05/02/2010  . Arthritis   . Cancer ([place])   . Environmental allergies     has mucus in throat  . Diphtheria     age 41  . CAD (coronary artery disease)     NSTEMI.  LHC 09/03/11: pLAD 40-50%, pDx 80-90% (very small), lateral branch of OM 99%, m-dCFX 40%, oRCA 80%, m-dRCA  50-70%, EF 55%.  She had PCI with BMS to lateral branch of the OM.  RCA was tx medically.   . Hyponatremia   . Murmur     echo 11/12: EF 55-60%, mod calcified AV annulus, mild AI, mild MR  . Dermatophytosis of nail   . UTI (urinary tract infection) 03/2015   Past Surgical History  Procedure Laterality Date  . Tonsillectomy      many years ago  . Cardiac surgery    . Colon surgery      CANCER  . Small bowel repair    . Left heart catheterization with coronary angiogram N/A 09/03/2011    Procedure: LEFT HEART CATHETERIZATION WITH CORONARY ANGIOGRAM;  Surgeon: Peter M Martinique, MD;  Location: [facility] CATH LAB;  Service: Cardiovascular;  Laterality: N/A;  . Percutaneous coronary stent intervention (pci-s)  09/03/2011    Procedure: PERCUTANEOUS CORONARY STENT INTERVENTION (PCI-S);  Surgeon: Peter M Martinique, MD;  Location: [facility] CATH LAB;  Service: Cardiovascular;;   Social History:   reports that she has never smoked. She has never used smokeless tobacco. She reports that she does not drink alcohol or use illicit drugs.  Family History  Problem Relation Age of Onset  . Hypertension Mother   . Uterine cancer Mother   . Ovarian cancer Mother   . Hypertension Father   . Stroke Father   . Hypertension Brother   . Hyperlipidemia Brother   . Heart attack  Brother   . Lung cancer Sister     Medications: Patient's Medications  New Prescriptions   No medications on file  Previous Medications   AMLODIPINE (NORVASC) 5 MG TABLET    Take 5 mg by mouth daily.   ASPIRIN 81 MG TABLET    Take 81 mg by mouth daily.    ATORVASTATIN (LIPITOR) 80 MG TABLET    Take 80 mg by mouth once a week. Take on Mondays   BISACODYL (DULCOLAX) 10 MG SUPPOSITORY    Place 1 suppository (10 mg total) rectally daily as needed for moderate constipation.   CALCIUM-VITAMIN D (OSCAL WITH D) 500-200 MG-UNIT PER TABLET    Take 1 tablet by mouth 2 (two) times daily.   DOCUSATE SODIUM (COLACE) 100 MG CAPSULE    Take 1 capsule (100 mg  total) by mouth 2 (two) times daily.   METOPROLOL TARTRATE (LOPRESSOR) 50 MG TABLET    Take 1 tablet (50 mg total) by mouth 2 (two) times daily.   MULTIPLE VITAMIN (MULTI VITAMIN DAILY) TABS    Take 1 tablet by mouth daily. Pt. Not sure of dose.   NITROGLYCERIN (NITROSTAT) 0.4 MG SL TABLET    Place 1 tablet (0.4 mg total) under the tongue every 5 (five) minutes as needed for chest pain.   PANTOPRAZOLE (PROTONIX) 40 MG TABLET    Take 1 tablet (40 mg total) by mouth daily.   PREGABALIN (LYRICA) 50 MG CAPSULE    Take one capsule by mouth three times daily for neuropathy   SENNA-DOCUSATE (SENOKOT-S) 8.6-50 MG PER TABLET    Take 1 tablet by mouth at bedtime as needed for mild constipation.   SODIUM CHLORIDE 1 G TABLET    Take 1 g by mouth 2 (two) times daily with a meal.   TRAMADOL (ULTRAM) 50 MG TABLET    1 by mouth for pain 2-5/10 every 6 hours , 2 by mouth for pain 6-10/10 every 6 hours as needed   VALSARTAN (DIOVAN) 320 MG TABLET    Take 1 tablet (320 mg total) by mouth daily.  Modified Medications   No medications on file  Discontinued Medications   ACETAMINOPHEN (TYLENOL) 500 MG TABLET    Take 1,000 mg by mouth 2 (two) times daily.      Physical Exam: Filed Vitals:   11/22/15 0939  BP: 134/75  Pulse: 69  Temp: 97.8 F (36.6 C)  Resp: 18  Height: 5\' 7"  (1.702 m)  Weight: 110 lb 1.6 oz (49.941 kg)  SpO2: 95%    Physical Exam  Constitutional: She appears well-developed and well-nourished. No distress.  HENT:  Head: Normocephalic and atraumatic.  Mouth/Throat: Oropharynx is clear and moist. No oropharyngeal exudate.  Eyes: Conjunctivae are normal. Pupils are equal, round, and reactive to light.  Neck: Normal range of motion. Neck supple.  Cardiovascular: Normal rate, regular rhythm and normal heart sounds.   Pulmonary/Chest: Effort normal and breath sounds normal.  Abdominal: Soft. Bowel sounds are normal.  Musculoskeletal: She exhibits edema (trace edema bilaterally). She  exhibits no tenderness.  Neurological: She is alert.  Skin: Skin is warm and dry. She is not diaphoretic.  Psychiatric: She has a normal mood and affect. Cognition and memory are impaired. She exhibits abnormal recent memory.    Labs reviewed: Basic Metabolic Panel:  Recent Labs  05/30/15 0431  05/31/15 0342  06/01/15 0511  07/14/15 0710 07/14/15 1256 07/15/15 0559  09/25/15 10/10/15 10/26/15  NA 126*  < > 125*  < >  127*  < > 126* 128* 130*  < > 125* 127* 125*  K 4.6  < > 4.6  < > 4.8  < > 4.4 4.5 4.5  < > 4.7 5.0 4.8  CL 92*  < > 91*  < > 89*  < > 97* 100* 94*  --   --   --   --   CO2 25  < > 25  < > 27  < > 25 25 29   --   --   --   --   GLUCOSE 113*  < > 101*  < > 92  < > 100* 105* 95  --   --   --   --   BUN 13  < > 19  < > 22*  < > 9 8 7   < > 10 12 13   CREATININE 0.96  < > 1.01*  < > 0.99  < > 0.82 0.81 0.84  < > 0.6 0.7 0.6  CALCIUM 8.8*  < > 8.6*  < > 9.0  < > 8.7* 8.9 9.5  --   --   --   --   MG 1.7  --  1.8  --  1.8  --   --   --   --   --   --   --   --   < > = values in this interval not displayed. Liver Function Tests:  Recent Labs  05/31/15 0342 06/01/15 0511 07/11/15 1716 07/24/15 10/26/15  AST 21 23 29 16 27   ALT 15 18 21 18 29   ALKPHOS 93 89 102 112 98  BILITOT 0.8 0.9 0.7  --   --   PROT 6.4* 6.4* 7.3  --   --   ALBUMIN 2.4* 2.4* 3.4*  --   --     Recent Labs  05/25/15 1743  LIPASE 35   No results for input(s): AMMONIA in the last 8760 hours. CBC:  Recent Labs  05/31/15 0342 06/01/15 0511 07/11/15 1716 07/12/15 0532 07/13/15 0624 07/14/15 0710 07/24/15 10/26/15  WBC 10.3 7.6 5.3 4.3 4.2 4.2 5.6 6.0  NEUTROABS 7.4 5.3 4.2  --   --   --   --   --   HGB 16.4* 16.4* 17.5* 17.0* 15.7* 15.2* 16.2* 14.8  HCT 46.7* 46.7* 49.6* 48.1* 45.5 44.5 48* 43  MCV 90.0 91.0 91.2 91.1 93.2 93.9  --   --   PLT 403* 397 426* 358 367 325 489* 617*   TSH:  Recent Labs  02/17/15 1229 05/30/15 0937  TSH 5.33* 3.697   A1C: Lab Results  Component Value  Date   HGBA1C 6.0 12/02/2008   Lipid Panel:  Recent Labs  10/26/15  CHOL 143  HDL 54  LDLCALC 77  TRIG 63    Assessment/Plan 1. Hyponatremia Sodium level unchanged and stable, conts on sodium tablet 1 gm daily   2. Chronic diastolic heart failure (HCC) Euvolemic, conts on valsartan, metoprolol and ASA  3. Essential hypertension Blood pressure remains stable, will cont current regimen.   4. Protein-calorie malnutrition, severe ([place]) Ongoing weight loss, being followed by RD, fortified foods added and medpass increased to TID  5. Memory loss -will have staff get MMSE and leave for provider review. Pt poor historian and staff notes ongoing memory loss. Pt with overall decline, not wanting to get out of bed, weight loss and memory loss noted.   6. Hyperlipidemia -LDL at goal, pt with noted myalgias, will decrease lipitor  to 20 mg daily and monitor   Zamzam Whinery K. Harle Battiest  [facility] [phone] 8 am - 5 pm) [phone] (after hours)

## 2015-12-15 ENCOUNTER — Other Ambulatory Visit: Payer: Self-pay | Admitting: *Deleted

## 2015-12-15 MED ORDER — PREGABALIN 50 MG PO CAPS: ORAL_CAPSULE | ORAL | Status: DC

## 2015-12-15 NOTE — Telephone Encounter (Signed)
[facility] 

## 2015-12-20 ENCOUNTER — Non-Acute Institutional Stay (SKILLED_NURSING_FACILITY): Payer: Medicare Other | Admitting: Family

## 2015-12-20 DIAGNOSIS — I25811 Atherosclerosis of native coronary artery of transplanted heart without angina pectoris: Secondary | ICD-10-CM | POA: Diagnosis not present

## 2015-12-20 DIAGNOSIS — I5032 Chronic diastolic (congestive) heart failure: Secondary | ICD-10-CM | POA: Diagnosis not present

## 2015-12-20 DIAGNOSIS — K219 Gastro-esophageal reflux disease without esophagitis: Secondary | ICD-10-CM | POA: Diagnosis not present

## 2015-12-20 DIAGNOSIS — I10 Essential (primary) hypertension: Secondary | ICD-10-CM

## 2015-12-20 DIAGNOSIS — E785 Hyperlipidemia, unspecified: Secondary | ICD-10-CM

## 2015-12-20 DIAGNOSIS — I25769 Atherosclerosis of bypass graft of coronary artery of transplanted heart with unspecified angina pectoris: Secondary | ICD-10-CM

## 2015-12-20 NOTE — Progress Notes (Signed)
Patient ID: Becky Morales, female   DOB: August 27, 1920, 82 y.o.   MRN: [id number]  Location:  [place] of Service:  SNF [phone]) Provider:  Blanchie Serve, MD   Garret Reddish, MD  Patient Care Team: Marin Olp, MD as PCP - General (Family Medicine)  Extended Emergency Contact Information Primary Emergency Contact: Jari Pigg of [place] Phone: [phone] Work Phone: [phone] Relation: Friend Secondary Emergency Contact: [place] of [place] Phone: [phone] Relation: Friend  Code Status:  Full Code  Goals of care: Advanced Directive information Advanced Directives 11/22/2015  Does patient have an advance directive? No  Would patient like information on creating an advanced directive? No - patient declined information  Pre-existing out of facility DNR order (yellow form or pink MOST form) -     Chief Complaint  Patient presents with  . Medical Management of Chronic Issues    HPI:  Pt is a 82 y.o. female seen today at [facility] for medical management of chronic diseases. She has a medical history of HTN, CHF, GERD, CAD, Hyperlipidemia amongst others. She is seen in her room today. She states no acute issues. Facility staff reports no concerns.     Past Medical History  Diagnosis Date  . CARCINOID TUMOR 04/13/2007  . HYPERTENSION 04/13/2007  . ALLERGIC RHINITIS 04/13/2007  . GERD 04/13/2007  . HYPERGLYCEMIA 04/13/2007  . CHOLELITHIASIS 05/02/2010  . Arthritis   . Cancer ([place])   . Environmental allergies     has mucus in throat  . Diphtheria     age 9  . CAD (coronary artery disease)     NSTEMI.  LHC 09/03/11: pLAD 40-50%, pDx 80-90% (very small), lateral branch of OM 99%, m-dCFX 40%, oRCA 80%, m-dRCA 50-70%, EF 55%.  She had PCI with BMS to lateral branch of the OM.  RCA was tx medically.   . Hyponatremia   . Murmur     echo 11/12: EF 55-60%, mod calcified AV annulus,  mild AI, mild MR  . Dermatophytosis of nail   . UTI (urinary tract infection) 03/2015   Past Surgical History  Procedure Laterality Date  . Tonsillectomy      many years ago  . Cardiac surgery    . Colon surgery      CANCER  . Small bowel repair    . Left heart catheterization with coronary angiogram N/A 09/03/2011    Procedure: LEFT HEART CATHETERIZATION WITH CORONARY ANGIOGRAM;  Surgeon: Peter M Martinique, MD;  Location: [facility] CATH LAB;  Service: Cardiovascular;  Laterality: N/A;  . Percutaneous coronary stent intervention (pci-s)  09/03/2011    Procedure: PERCUTANEOUS CORONARY STENT INTERVENTION (PCI-S);  Surgeon: Peter M Martinique, MD;  Location: [facility] CATH LAB;  Service: Cardiovascular;;    Allergies  Allergen Reactions  . Ciprofloxacin     emesis  . Paxil [Paroxetine Hcl] Nausea And Vomiting      Medication List       This list is accurate as of: 12/20/15  5:27 PM.  Always use your most recent med list.               amLODipine 5 MG tablet  Commonly known as:  NORVASC  Take 5 mg by mouth daily.     aspirin 81 MG tablet  Take 81 mg by mouth daily.     atorvastatin 80 MG tablet  Commonly known as:  LIPITOR  Take 20  mg by mouth once a week. Take on Mondays     bisacodyl 10 MG suppository  Commonly known as:  DULCOLAX  Place 1 suppository (10 mg total) rectally daily as needed for moderate constipation.     calcium-vitamin D 500-200 MG-UNIT tablet  Commonly known as:  OSCAL WITH D  Take 1 tablet by mouth 2 (two) times daily.     docusate sodium 100 MG capsule  Commonly known as:  COLACE  Take 1 capsule (100 mg total) by mouth 2 (two) times daily.     metoprolol 50 MG tablet  Commonly known as:  LOPRESSOR  Take 1 tablet (50 mg total) by mouth 2 (two) times daily.     MULTI VITAMIN DAILY Tabs  Take 1 tablet by mouth daily. Pt. Not sure of dose.     nitroGLYCERIN 0.4 MG SL tablet  Commonly known as:  NITROSTAT  Place 1 tablet (0.4 mg total) under the tongue every 5  (five) minutes as needed for chest pain.     pantoprazole 40 MG tablet  Commonly known as:  PROTONIX  Take 1 tablet (40 mg total) by mouth daily.     pregabalin 50 MG capsule  Commonly known as:  LYRICA  Take one capsule by mouth three times daily for neuropathy     senna-docusate 8.6-50 MG tablet  Commonly known as:  Senokot-S  Take 1 tablet by mouth at bedtime as needed for mild constipation.     sodium chloride 1 g tablet  Take 1 g by mouth 2 (two) times daily with a meal.     traMADol 50 MG tablet  Commonly known as:  ULTRAM  1 by mouth for pain 2-5/10 every 6 hours , 2 by mouth for pain 6-10/10 every 6 hours as needed     valsartan 320 MG tablet  Commonly known as:  DIOVAN  Take 1 tablet (320 mg total) by mouth daily.        Review of Systems  Constitutional: Negative for fever, chills, activity change and fatigue.       Eats good breakfast but states little lunch and dinner.   HENT: Negative.   Eyes: Negative.   Respiratory: Negative.   Cardiovascular: Negative.   Gastrointestinal: Negative.   Endocrine: Negative.   Genitourinary: Negative.   Musculoskeletal: Positive for gait problem. Negative for myalgias and joint swelling.  Skin: Positive for wound.       DRSG changed by wound Nurse.   Allergic/Immunologic: Negative.   Neurological: Negative.   Hematological: Negative.   Psychiatric/Behavioral: Negative.     Immunization History  Administered Date(s) Administered  . Influenza Split 07/15/2011, 07/02/2012  . Influenza Whole 08/19/2007, 07/29/2008  . Influenza,inj,Quad PF,36+ Mos 10/12/2013  . Influenza-Unspecified 08/10/2015  . PPD Test 07/15/2015, 07/29/2015  . Pneumococcal Conjugate-13 12/27/2014  . Pneumococcal Polysaccharide-23 08/22/2003  . Pneumococcal-Unspecified 08/27/2015  . Tdap 05/30/2011   Pertinent  Health Maintenance Due  Topic Date Due  . DEXA SCAN  12/26/2017 (Originally 12/22/1986)  . INFLUENZA VACCINE  05/21/2016  . PNA vac Low  Risk Adult  Completed   Fall Risk  12/27/2014  Falls in the past year? No   Functional Status Survey:    Filed Vitals:   12/20/15 1717  BP: 103/70  Pulse: 61  Temp: 97.6 F (36.4 C)  Resp: 18  Weight: 111 lb 3.2 oz (50.44 kg)  SpO2: 96%   Body mass index is 17.41 kg/(m^2). Physical Exam  Constitutional: She appears well-developed.  Frail elderly. Pleasantly confused at times.   HENT:  Head: Normocephalic.  Right Ear: External ear normal.  Left Ear: External ear normal.  Mouth/Throat: Oropharynx is clear and moist.  Eyes: Conjunctivae and EOM are normal. Pupils are equal, round, and reactive to light. Right eye exhibits no discharge. Left eye exhibits no discharge. No scleral icterus.  Neck: Normal range of motion. No JVD present. No tracheal deviation present.  Cardiovascular: Normal rate, regular rhythm, normal heart sounds and intact distal pulses.  Exam reveals no gallop and no friction rub.   No murmur heard. Pulmonary/Chest: Effort normal and breath sounds normal. No respiratory distress. She has no wheezes. She has no rales.  Abdominal: Soft. Bowel sounds are normal. She exhibits no distension and no mass. There is no tenderness. There is no rebound and no guarding.  Musculoskeletal: She exhibits no edema or tenderness.  Bed bound   Lymphadenopathy:    She has no cervical adenopathy.  Neurological: She is alert.  Skin: Skin is warm and dry. No rash noted. No erythema. No pallor.  Sacral occlusive Drsg dry, clean and intact changed by wound Nurse prior to visit. No signs of infection on surrounding tissues.   Psychiatric: She has a normal mood and affect.    Labs reviewed:  Recent Labs  05/30/15 0431  05/31/15 0342  06/01/15 0511  07/14/15 0710 07/14/15 1256 07/15/15 0559  09/25/15 10/10/15 10/26/15  NA 126*  < > 125*  < > 127*  < > 126* 128* 130*  < > 125* 127* 125*  K 4.6  < > 4.6  < > 4.8  < > 4.4 4.5 4.5  < > 4.7 5.0 4.8  CL 92*  < > 91*  < > 89*  < > 97*  100* 94*  --   --   --   --   CO2 25  < > 25  < > 27  < > 25 25 29   --   --   --   --   GLUCOSE 113*  < > 101*  < > 92  < > 100* 105* 95  --   --   --   --   BUN 13  < > 19  < > 22*  < > 9 8 7   < > 10 12 13   CREATININE 0.96  < > 1.01*  < > 0.99  < > 0.82 0.81 0.84  < > 0.6 0.7 0.6  CALCIUM 8.8*  < > 8.6*  < > 9.0  < > 8.7* 8.9 9.5  --   --   --   --   MG 1.7  --  1.8  --  1.8  --   --   --   --   --   --   --   --   < > = values in this interval not displayed.  Recent Labs  05/31/15 0342 06/01/15 0511 07/11/15 1716 07/24/15 10/26/15  AST 21 23 29 16 27   ALT 15 18 21 18 29   ALKPHOS 93 89 102 112 98  BILITOT 0.8 0.9 0.7  --   --   PROT 6.4* 6.4* 7.3  --   --   ALBUMIN 2.4* 2.4* 3.4*  --   --     Recent Labs  05/31/15 0342 06/01/15 0511 07/11/15 1716 07/12/15 0532 07/13/15 0624 07/14/15 0710 07/24/15 10/26/15  WBC 10.3 7.6 5.3 4.3 4.2 4.2 5.6 6.0  NEUTROABS 7.4 5.3 4.2  --   --   --   --   --  HGB 16.4* 16.4* 17.5* 17.0* 15.7* 15.2* 16.2* 14.8  HCT 46.7* 46.7* 49.6* 48.1* 45.5 44.5 48* 43  MCV 90.0 91.0 91.2 91.1 93.2 93.9  --   --   PLT 403* 397 426* 358 367 325 489* 617*   Lab Results  Component Value Date   TSH 3.697 05/30/2015   Lab Results  Component Value Date   HGBA1C 6.0 12/02/2008   Lab Results  Component Value Date   CHOL 143 10/26/2015   HDL 54 10/26/2015   LDLCALC 77 10/26/2015   LDLDIRECT 141.6 12/02/2008   TRIG 63 10/26/2015   CHOLHDL 2 10/05/2014    Significant Diagnostic Results in last 30 days:  No results found.  Assessment/Plan 1. Essential hypertension B/p Stable. Continue on Diovan,Metoprolol, Amlodipine. Ordering BMP  2. Chronic diastolic CHF (congestive heart failure) (HCC) Stable. No edema or SOB. Continue on Metoprolol. Daily weight.   3. CAD (coronary artery disease)  s/p  PCI for NSTEMI. with angina.  No chest pain. Continue on daily ASA.Continue to control high risk factors.   4. Gastroesophageal reflux disease without  esophagitis Asymptomatic. Continue on Protonix.  5. Hyperlipidemia Continue on Atorvastatin 20 mg Tablet. Monitor Lipid panel.    Family/ staff Communication:Reviewed plan of care with Facility Nurse supervisor.   Labs/tests ordered:  CBC, BMP 12/21/2015

## 2015-12-21 LAB — BASIC METABOLIC PANEL
BUN: 15 mg/dL (ref 4–21)
CREATININE: 0.7 mg/dL (ref 0.5–1.1)
GLUCOSE: 138 mg/dL
POTASSIUM: 4.6 mmol/L (ref 3.4–5.3)
Sodium: 128 mmol/L — AB (ref 137–147)

## 2015-12-21 LAB — CBC AND DIFFERENTIAL
HCT: 42 % (ref 36–46)
Hemoglobin: 13.9 g/dL (ref 12.0–16.0)
Platelets: 536 10*3/uL — AB (ref 150–399)
WBC: 6.1 10^3/mL

## 2016-01-17 ENCOUNTER — Other Ambulatory Visit: Payer: Self-pay

## 2016-01-17 MED ORDER — PREGABALIN 50 MG PO CAPS: ORAL_CAPSULE | ORAL | Status: DC

## 2016-01-17 NOTE — Telephone Encounter (Signed)
Rx faxed to [facility] @ [phone], phone number [phone]  

## 2016-01-18 ENCOUNTER — Non-Acute Institutional Stay (SKILLED_NURSING_FACILITY): Payer: Medicare Other | Admitting: Family

## 2016-01-18 DIAGNOSIS — R05 Cough: Secondary | ICD-10-CM

## 2016-01-18 DIAGNOSIS — R059 Cough, unspecified: Secondary | ICD-10-CM

## 2016-01-18 NOTE — Progress Notes (Signed)
Patient ID: TEARSA SOCK, female   DOB: 1920/05/20, 82 y.o.   MRN: [id number]  Location:  [place] of Service:  SNF [phone]) Provider:  Cher Nakai, MD   Garret Reddish, MD  Patient Care Team: Marin Olp, MD as PCP - General (Family Medicine)  Extended Emergency Contact Information Primary Emergency Contact: Jari Pigg of [place] Phone: [phone] Work Phone: [phone] Relation: Friend Secondary Emergency Contact: [place] of [place] Phone: [phone] Relation: Friend  Code Status:  Full Code  Goals of care: Advanced Directive information Advanced Directives 11/22/2015  Does patient have an advance directive? No  Would patient like information on creating an advanced directive? No - patient declined information  Pre-existing out of facility DNR order (yellow form or pink MOST form) -     Chief Complaint  Patient presents with  . Acute Visit    cough     HPI:  Pt is a 82 y.o. female seen today at [facility] for an acute visit for evaluation of cough. She has a medical history of HTN, GERD, Cancer, Arthritis, CAD among others. She is seen in her room today per facility Nurse request. Facility Nurse reports increased cough with abnormal breath sounds. She denies any fever, chills, sore throat, shortness of breath or wheezing.    Past Medical History  Diagnosis Date  . CARCINOID TUMOR 04/13/2007  . HYPERTENSION 04/13/2007  . ALLERGIC RHINITIS 04/13/2007  . GERD 04/13/2007  . HYPERGLYCEMIA 04/13/2007  . CHOLELITHIASIS 05/02/2010  . Arthritis   . Cancer ([place])   . Environmental allergies     has mucus in throat  . Diphtheria     age 44  . CAD (coronary artery disease)     NSTEMI.  LHC 09/03/11: pLAD 40-50%, pDx 80-90% (very small), lateral branch of OM 99%, m-dCFX 40%, oRCA 80%, m-dRCA 50-70%, EF 55%.  She had PCI with BMS to lateral branch of the OM.  RCA was tx  medically.   . Hyponatremia   . Murmur     echo 11/12: EF 55-60%, mod calcified AV annulus, mild AI, mild MR  . Dermatophytosis of nail   . UTI (urinary tract infection) 03/2015   Past Surgical History  Procedure Laterality Date  . Tonsillectomy      many years ago  . Cardiac surgery    . Colon surgery      CANCER  . Small bowel repair    . Left heart catheterization with coronary angiogram N/A 09/03/2011    Procedure: LEFT HEART CATHETERIZATION WITH CORONARY ANGIOGRAM;  Surgeon: Peter M Martinique, MD;  Location: [facility] CATH LAB;  Service: Cardiovascular;  Laterality: N/A;  . Percutaneous coronary stent intervention (pci-s)  09/03/2011    Procedure: PERCUTANEOUS CORONARY STENT INTERVENTION (PCI-S);  Surgeon: Peter M Martinique, MD;  Location: [facility] CATH LAB;  Service: Cardiovascular;;    Allergies  Allergen Reactions  . Ciprofloxacin     emesis  . Paxil [Paroxetine Hcl] Nausea And Vomiting      Medication List       This list is accurate as of: 01/18/16  3:08 PM.  Always use your most recent med list.               amLODipine 5 MG tablet  Commonly known as:  NORVASC  Take 5 mg by mouth daily.     aspirin 81 MG tablet  Take 81 mg by mouth  daily.     atorvastatin 80 MG tablet  Commonly known as:  LIPITOR  Take 20 mg by mouth once a week. Take on Mondays     bisacodyl 10 MG suppository  Commonly known as:  DULCOLAX  Place 1 suppository (10 mg total) rectally daily as needed for moderate constipation.     calcium-vitamin D 500-200 MG-UNIT tablet  Commonly known as:  OSCAL WITH D  Take 1 tablet by mouth 2 (two) times daily.     docusate sodium 100 MG capsule  Commonly known as:  COLACE  Take 1 capsule (100 mg total) by mouth 2 (two) times daily.     metoprolol 50 MG tablet  Commonly known as:  LOPRESSOR  Take 1 tablet (50 mg total) by mouth 2 (two) times daily.     MULTI VITAMIN DAILY Tabs  Take 1 tablet by mouth daily. Pt. Not sure of dose.     nitroGLYCERIN 0.4 MG SL  tablet  Commonly known as:  NITROSTAT  Place 1 tablet (0.4 mg total) under the tongue every 5 (five) minutes as needed for chest pain.     pantoprazole 40 MG tablet  Commonly known as:  PROTONIX  Take 1 tablet (40 mg total) by mouth daily.     pregabalin 50 MG capsule  Commonly known as:  LYRICA  Take one capsule by mouth three times daily for neuropathy     senna-docusate 8.6-50 MG tablet  Commonly known as:  Senokot-S  Take 1 tablet by mouth at bedtime as needed for mild constipation.     sodium chloride 1 g tablet  Take 1 g by mouth 2 (two) times daily with a meal.     traMADol 50 MG tablet  Commonly known as:  ULTRAM  1 by mouth for pain 2-5/10 every 6 hours , 2 by mouth for pain 6-10/10 every 6 hours as needed     valsartan 320 MG tablet  Commonly known as:  DIOVAN  Take 1 tablet (320 mg total) by mouth daily.        Review of Systems  Constitutional: Negative for fever, chills, activity change, appetite change and fatigue.  HENT: Negative for congestion, rhinorrhea, sinus pressure, sneezing and sore throat.   Eyes: Negative.   Respiratory: Positive for cough. Negative for chest tightness and wheezing.   Cardiovascular: Negative for chest pain, palpitations and leg swelling.  Gastrointestinal: Negative for nausea, vomiting, abdominal pain, diarrhea, constipation and abdominal distention.  Endocrine: Negative.   Genitourinary: Negative.   Musculoskeletal: Positive for gait problem.  Skin: Negative.   Neurological: Negative.   Psychiatric/Behavioral: Negative for hallucinations, confusion, sleep disturbance and agitation.    Immunization History  Administered Date(s) Administered  . Influenza Split 07/15/2011, 07/02/2012  . Influenza Whole 08/19/2007, 07/29/2008  . Influenza,inj,Quad PF,36+ Mos 10/12/2013  . Influenza-Unspecified 08/10/2015  . PPD Test 07/15/2015, 07/29/2015  . Pneumococcal Conjugate-13 12/27/2014  . Pneumococcal Polysaccharide-23 08/22/2003    . Pneumococcal-Unspecified 08/27/2015  . Tdap 05/30/2011   Pertinent  Health Maintenance Due  Topic Date Due  . DEXA SCAN  12/26/2017 (Originally 12/22/1986)  . INFLUENZA VACCINE  05/21/2016  . PNA vac Low Risk Adult  Completed   Fall Risk  12/27/2014  Falls in the past year? No   Functional Status Survey:    Filed Vitals:   01/18/16 1504  BP: 137/68  Pulse: 67  Temp: 97.6 F (36.4 C)  Resp: 20  Height: 5\' 7"  (1.702 m)  Weight: 111 lb 3.2  oz (50.44 kg)  SpO2: 95%   Body mass index is 17.41 kg/(m^2). Physical Exam  Constitutional:  Frail Elderly in no distress  HENT:  Head: Normocephalic.  Mouth/Throat: Oropharynx is clear and moist.  Eyes: Conjunctivae and EOM are normal. Pupils are equal, round, and reactive to light. Right eye exhibits no discharge. Left eye exhibits no discharge. No scleral icterus.  Neck: Normal range of motion. No JVD present. No thyromegaly present.  Cardiovascular: Normal rate, regular rhythm, normal heart sounds and intact distal pulses.  Exam reveals no gallop and no friction rub.   No murmur heard. Pulmonary/Chest: Effort normal. No respiratory distress. She has no wheezes.  Bilateral lung crackles to Auscultation   Abdominal: Soft. Bowel sounds are normal. She exhibits no distension and no mass. There is no tenderness. There is no guarding.  Musculoskeletal: Normal range of motion. She exhibits no edema or tenderness.  Lymphadenopathy:    She has no cervical adenopathy.  Neurological: She is alert.  Skin: Skin is warm and dry. No rash noted. No erythema. No pallor.  Psychiatric: She has a normal mood and affect.    Labs reviewed:  Recent Labs  05/30/15 0431  05/31/15 0342  06/01/15 0511  07/14/15 0710 07/14/15 1256 07/15/15 0559  09/25/15 10/10/15 10/26/15  NA 126*  < > 125*  < > 127*  < > 126* 128* 130*  < > 125* 127* 125*  K 4.6  < > 4.6  < > 4.8  < > 4.4 4.5 4.5  < > 4.7 5.0 4.8  CL 92*  < > 91*  < > 89*  < > 97* 100* 94*  --    --   --   --   CO2 25  < > 25  < > 27  < > 25 25 29   --   --   --   --   GLUCOSE 113*  < > 101*  < > 92  < > 100* 105* 95  --   --   --   --   BUN 13  < > 19  < > 22*  < > 9 8 7   < > 10 12 13   CREATININE 0.96  < > 1.01*  < > 0.99  < > 0.82 0.81 0.84  < > 0.6 0.7 0.6  CALCIUM 8.8*  < > 8.6*  < > 9.0  < > 8.7* 8.9 9.5  --   --   --   --   MG 1.7  --  1.8  --  1.8  --   --   --   --   --   --   --   --   < > = values in this interval not displayed.  Recent Labs  05/31/15 0342 06/01/15 0511 07/11/15 1716 07/24/15 10/26/15  AST 21 23 29 16 27   ALT 15 18 21 18 29   ALKPHOS 93 89 102 112 98  BILITOT 0.8 0.9 0.7  --   --   PROT 6.4* 6.4* 7.3  --   --   ALBUMIN 2.4* 2.4* 3.4*  --   --     Recent Labs  05/31/15 0342 06/01/15 0511 07/11/15 1716 07/12/15 0532 07/13/15 0624 07/14/15 0710 07/24/15 10/26/15  WBC 10.3 7.6 5.3 4.3 4.2 4.2 5.6 6.0  NEUTROABS 7.4 5.3 4.2  --   --   --   --   --   HGB 16.4* 16.4* 17.5* 17.0* 15.7* 15.2* 16.2* 14.8  HCT 46.7* 46.7* 49.6* 48.1* 45.5 44.5 48* 43  MCV 90.0 91.0 91.2 91.1 93.2 93.9  --   --   PLT 403* 397 426* 358 367 325 489* 617*   Lab Results  Component Value Date   TSH 3.697 05/30/2015   Lab Results  Component Value Date   HGBA1C 6.0 12/02/2008   Lab Results  Component Value Date   CHOL 143 10/26/2015   HDL 54 10/26/2015   LDLCALC 77 10/26/2015   LDLDIRECT 141.6 12/02/2008   TRIG 63 10/26/2015   CHOLHDL 2 10/05/2014    Significant Diagnostic Results in last 30 days:  No results found.  Assessment/Plan  Cough Bilat. Crackles to Auscultation. Ordering portable CXR 2 views.Robitussin 10 mls by mouth every 6 hrs PRN      Family/ staff Communication: Reviewed plan with patient and facility Nurse.   Labs/tests ordered:  Portable CXR Pa/Lat today.

## 2016-01-24 ENCOUNTER — Non-Acute Institutional Stay (SKILLED_NURSING_FACILITY): Payer: Medicare Other | Admitting: Family

## 2016-01-24 ENCOUNTER — Encounter: Payer: Self-pay | Admitting: Family

## 2016-01-24 DIAGNOSIS — E43 Unspecified severe protein-calorie malnutrition: Secondary | ICD-10-CM

## 2016-01-24 DIAGNOSIS — I5032 Chronic diastolic (congestive) heart failure: Secondary | ICD-10-CM

## 2016-01-24 DIAGNOSIS — E785 Hyperlipidemia, unspecified: Secondary | ICD-10-CM

## 2016-01-24 DIAGNOSIS — I10 Essential (primary) hypertension: Secondary | ICD-10-CM

## 2016-01-24 DIAGNOSIS — J189 Pneumonia, unspecified organism: Secondary | ICD-10-CM

## 2016-01-24 LAB — BASIC METABOLIC PANEL
BUN: 9 mg/dL (ref 4–21)
Creatinine: 0.7 mg/dL (ref 0.5–1.1)
GLUCOSE: 87 mg/dL
Potassium: 4.2 mmol/L (ref 3.4–5.3)
SODIUM: 129 mmol/L — AB (ref 137–147)

## 2016-01-24 LAB — LIPID PANEL
Cholesterol: 148 mg/dL (ref 0–200)
HDL: 58 mg/dL (ref 35–70)
LDL CALC: 77 mg/dL
TRIGLYCERIDES: 65 mg/dL (ref 40–160)

## 2016-01-24 LAB — CBC AND DIFFERENTIAL
HCT: 39 % (ref 36–46)
Hemoglobin: 12.7 g/dL (ref 12.0–16.0)
Platelets: 474 K/µL — AB (ref 150–399)
WBC: 4.6 10*3/mL

## 2016-01-24 NOTE — Progress Notes (Signed)
Patient ID: Becky Morales, female   DOB: Dec 23, 1919, 82 y.o.   MRN: [id number]  Location:  [place] Room Number: 907 Place of Service:  SNF [phone]) Provider: Marlowe Sax, NP  Garret Reddish, MD  Patient Care Team: Marin Olp, MD as PCP - General (Family Medicine)  Extended Emergency Contact Information Primary Emergency Contact: Jari Pigg of [place] Phone: [phone] Work Phone: [phone] Relation: Friend Secondary Emergency Contact: Glen Gardner of [place] Phone: [phone] Relation: Friend  Code Status:  Full Code  Goals of care: Advanced Directive information Advanced Directives 11/22/2015  Does patient have an advance directive? No  Would patient like information on creating an advanced directive? No - patient declined information  Pre-existing out of facility DNR order (yellow form or pink MOST form) -     Chief Complaint  Patient presents with  . Medical Management of Chronic Issues    Routine Visit     HPI:  Pt is a 82 y.o. female seen today at [facility] for medical management of chronic diseases. She has a medical history of HTN, CHF,  Hyperlipidemia among others. She is seen in her room today. She complains of cough and congestion. She was recent 01/19/2016 treated for pneumonia with Augmentin 875 started by Cindi Carbon on call provider. Patient denies any fever, chills, chest pain or shortness of breath. Her systolic blood pressure readings has been >160.  Facility staff reports no new concerns.      Past Medical History  Diagnosis Date  . CARCINOID TUMOR 04/13/2007  . HYPERTENSION 04/13/2007  . ALLERGIC RHINITIS 04/13/2007  . GERD 04/13/2007  . HYPERGLYCEMIA 04/13/2007  . CHOLELITHIASIS 05/02/2010  . Arthritis   . Cancer ([place])   . Environmental allergies     has mucus in throat  . Diphtheria     age 43  . CAD (coronary artery disease)     NSTEMI.   LHC 09/03/11: pLAD 40-50%, pDx 80-90% (very small), lateral branch of OM 99%, m-dCFX 40%, oRCA 80%, m-dRCA 50-70%, EF 55%.  She had PCI with BMS to lateral branch of the OM.  RCA was tx medically.   . Hyponatremia   . Murmur     echo 11/12: EF 55-60%, mod calcified AV annulus, mild AI, mild MR  . Dermatophytosis of nail   . UTI (urinary tract infection) 03/2015   Past Surgical History  Procedure Laterality Date  . Tonsillectomy      many years ago  . Cardiac surgery    . Colon surgery      CANCER  . Small bowel repair    . Left heart catheterization with coronary angiogram N/A 09/03/2011    Procedure: LEFT HEART CATHETERIZATION WITH CORONARY ANGIOGRAM;  Surgeon: Peter M Martinique, MD;  Location: [facility] CATH LAB;  Service: Cardiovascular;  Laterality: N/A;  . Percutaneous coronary stent intervention (pci-s)  09/03/2011    Procedure: PERCUTANEOUS CORONARY STENT INTERVENTION (PCI-S);  Surgeon: Peter M Martinique, MD;  Location: [facility] CATH LAB;  Service: Cardiovascular;;    Allergies  Allergen Reactions  . Ciprofloxacin     emesis  . Paxil [Paroxetine Hcl] Nausea And Vomiting      Medication List       This list is accurate as of: 01/24/16 12:10 PM.  Always use your most recent med list.               acetaminophen 500 MG tablet  Commonly known as:  TYLENOL  Take 1,000 mg by mouth 2 (two) times daily.     amLODipine 5 MG tablet  Commonly known as:  NORVASC  Take 5 mg by mouth daily.     amoxicillin-clavulanate 875-125 MG tablet  Commonly known as:  AUGMENTIN  Take 1 tablet by mouth 2 (two) times daily.     aspirin 81 MG tablet  Take 81 mg by mouth daily.     atorvastatin 20 MG tablet  Commonly known as:  LIPITOR  Take 20 mg by mouth daily.     bisacodyl 10 MG suppository  Commonly known as:  DULCOLAX  Place 1 suppository (10 mg total) rectally daily as needed for moderate constipation.     calcium-vitamin D 500-200 MG-UNIT tablet  Commonly known as:  OSCAL WITH D  Take 1  tablet by mouth 2 (two) times daily.     docusate sodium 100 MG capsule  Commonly known as:  COLACE  Take 1 capsule (100 mg total) by mouth 2 (two) times daily.     metoprolol 50 MG tablet  Commonly known as:  LOPRESSOR  Take 1 tablet (50 mg total) by mouth 2 (two) times daily.     MULTI VITAMIN DAILY Tabs  Take 1 tablet by mouth daily. Pt. Not sure of dose.     nitroGLYCERIN 0.4 MG SL tablet  Commonly known as:  NITROSTAT  Place 1 tablet (0.4 mg total) under the tongue every 5 (five) minutes as needed for chest pain.     nystatin cream  Commonly known as:  MYCOSTATIN  Apply 1 application topically 2 (two) times daily. Apply to groin and buttock for fungal rash     pantoprazole 40 MG tablet  Commonly known as:  PROTONIX  Take 1 tablet (40 mg total) by mouth daily.     pregabalin 50 MG capsule  Commonly known as:  LYRICA  Take one capsule by mouth three times daily for neuropathy     senna-docusate 8.6-50 MG tablet  Commonly known as:  Senokot-S  Take 1 tablet by mouth at bedtime as needed for mild constipation.     sodium chloride 1 g tablet  Take 1 g by mouth 2 (two) times daily with a meal.     traMADol 50 MG tablet  Commonly known as:  ULTRAM  1 by mouth for pain 2-5/10 every 6 hours , 2 by mouth for pain 6-10/10 every 6 hours as needed     valsartan 320 MG tablet  Commonly known as:  DIOVAN  Take 1 tablet (320 mg total) by mouth daily.        Review of Systems  Constitutional: Negative for fever, chills, activity change, appetite change and fatigue.  HENT: Negative for congestion, rhinorrhea, sneezing and sore throat.   Eyes: Negative.   Respiratory: Positive for cough and wheezing. Negative for chest tightness and shortness of breath.   Cardiovascular: Negative for chest pain, palpitations and leg swelling.  Gastrointestinal: Negative for nausea, vomiting, abdominal pain, diarrhea, constipation and abdominal distention.  Endocrine: Negative.     Genitourinary: Negative.   Musculoskeletal: Positive for gait problem.  Skin: Negative.   Allergic/Immunologic: Negative.   Neurological: Negative.   Hematological: Negative.   Psychiatric/Behavioral: Negative.     Immunization History  Administered Date(s) Administered  . Influenza Split 07/15/2011, 07/02/2012  . Influenza Whole 08/19/2007, 07/29/2008  . Influenza,inj,Quad PF,36+ Mos 10/12/2013  . Influenza-Unspecified 08/10/2015  . PPD Test 07/15/2015, 07/29/2015  . Pneumococcal  Conjugate-13 12/27/2014  . Pneumococcal Polysaccharide-23 08/22/2003  . Pneumococcal-Unspecified 08/27/2015  . Tdap 05/30/2011   Pertinent  Health Maintenance Due  Topic Date Due  . DEXA SCAN  12/26/2017 (Originally 12/22/1986)  . INFLUENZA VACCINE  05/21/2016  . PNA vac Low Risk Adult  Completed   Fall Risk  12/27/2014  Falls in the past year? No   Functional Status Survey:    Filed Vitals:   01/24/16 1153  BP: 161/69  Pulse: 69  Temp: 97.5 F (36.4 C)  TempSrc: Oral  Resp: 20  Height: 5\' 7"  (1.702 m)  Weight: 109 lb 4.8 oz (49.578 kg)  SpO2: 95%   Body mass index is 17.11 kg/(m^2). Physical Exam  Constitutional: She appears well-developed and well-nourished. No distress.  HENT:  Head: Normocephalic.  Mouth/Throat: Oropharynx is clear and moist.  Eyes: Conjunctivae and EOM are normal. Pupils are equal, round, and reactive to light. Right eye exhibits no discharge. Left eye exhibits no discharge. No scleral icterus.  Neck: Normal range of motion. No JVD present. No thyromegaly present.  Cardiovascular: Normal rate, regular rhythm, normal heart sounds and intact distal pulses.  Exam reveals no gallop and no friction rub.   No murmur heard. Pulmonary/Chest: Effort normal. No respiratory distress.  Left upper lobe rales and scattered exp wheezes on auscultation.   Abdominal: Soft. Bowel sounds are normal. She exhibits no distension and no mass. There is no tenderness. There is no rebound  and no guarding.  Musculoskeletal: Normal range of motion. She exhibits no edema or tenderness.  Lymphadenopathy:    She has no cervical adenopathy.  Neurological: She is alert.  Skin: Skin is warm and dry. No erythema. No pallor.  Bilateral groin and gluteal beefy red rash.   Psychiatric: She has a normal mood and affect.    Labs reviewed:  Recent Labs  05/30/15 0431  05/31/15 0342  06/01/15 0511  07/14/15 0710 07/14/15 1256 07/15/15 0559  10/10/15 10/26/15 12/21/15  NA 126*  < > 125*  < > 127*  < > 126* 128* 130*  < > 127* 125* 128*  K 4.6  < > 4.6  < > 4.8  < > 4.4 4.5 4.5  < > 5.0 4.8 4.6  CL 92*  < > 91*  < > 89*  < > 97* 100* 94*  --   --   --   --   CO2 25  < > 25  < > 27  < > 25 25 29   --   --   --   --   GLUCOSE 113*  < > 101*  < > 92  < > 100* 105* 95  --   --   --   --   BUN 13  < > 19  < > 22*  < > 9 8 7   < > 12 13 15   CREATININE 0.96  < > 1.01*  < > 0.99  < > 0.82 0.81 0.84  < > 0.7 0.6 0.7  CALCIUM 8.8*  < > 8.6*  < > 9.0  < > 8.7* 8.9 9.5  --   --   --   --   MG 1.7  --  1.8  --  1.8  --   --   --   --   --   --   --   --   < > = values in this interval not displayed.  Recent Labs  05/31/15 0342 06/01/15 0511 07/11/15 1716  07/24/15 10/26/15  AST 21 23 29 16 27   ALT 15 18 21 18 29   ALKPHOS 93 89 102 112 98  BILITOT 0.8 0.9 0.7  --   --   PROT 6.4* 6.4* 7.3  --   --   ALBUMIN 2.4* 2.4* 3.4*  --   --     Recent Labs  05/31/15 0342 06/01/15 0511 07/11/15 1716 07/12/15 0532 07/13/15 0624 07/14/15 0710 07/24/15 10/26/15 12/21/15  WBC 10.3 7.6 5.3 4.3 4.2 4.2 5.6 6.0 6.1  NEUTROABS 7.4 5.3 4.2  --   --   --   --   --   --   HGB 16.4* 16.4* 17.5* 17.0* 15.7* 15.2* 16.2* 14.8 13.9  HCT 46.7* 46.7* 49.6* 48.1* 45.5 44.5 48* 43 42  MCV 90.0 91.0 91.2 91.1 93.2 93.9  --   --   --   PLT 403* 397 426* 358 367 325 489* 617* 536*   Lab Results  Component Value Date   TSH 3.697 05/30/2015   Lab Results  Component Value Date   HGBA1C 6.0 12/02/2008    Lab Results  Component Value Date   CHOL 143 10/26/2015   HDL 54 10/26/2015   LDLCALC 77 10/26/2015   LDLDIRECT 141.6 12/02/2008   TRIG 63 10/26/2015   CHOLHDL 2 10/05/2014    Significant Diagnostic Results in last 30 days:  No results found.  Assessment/Plan 1. Essential Hypertension Sys B/p elevated > 160's. Currently on Metoprolol and Amlodipine. Increase amlodipine to 10 mg Tablet daily. Monitor vital signs Bid X 5 days then resume previous orders.   2. Chronic diastolic CHF No weight gain, edema or shortness of breath.Exam findings positive for left upper lobe rales and scattered wheezes. Recent CXR showed pneumonia and was treated with Augmentin 875 mg by on call provider. Continue to monitor daily weights.   3. Hyperlipidemia  Continue on Lipitor 20 mg Tablet. Monitor lipid panel.   4. Protein-Calorie Malnutrition  Has had two pounds weight loss over one month. Continue on protein supplements. Encourage oral intake.   5. CAP   Afebrile. Recent CXR showed pneumonia and was treated with Augmentin 875 mg by on call provider.Exam findings positive for left upper lobe rales and scattered wheezes.Continue on Augmentin. Adding Duonebs 0.5-3 mg /3 ml inhale sol. Inhale twice daily for cough/congestion, wheezing X 5 days . Incintence spirometer every 8 Hrs X 5 days. Portable CXR 2 views f/u Pneumonia 01/29/2016   Family/ staff Communication: Reviewed plan with patient and facility Nurse supervisor.   Labs/tests ordered: Portable CXR 2 views f/u Pneumonia 01/29/2016

## 2016-02-05 LAB — HM DIABETES FOOT EXAM

## 2016-02-20 ENCOUNTER — Encounter: Payer: Self-pay | Admitting: Family

## 2016-02-20 ENCOUNTER — Non-Acute Institutional Stay (SKILLED_NURSING_FACILITY): Payer: Medicare Other | Admitting: Family

## 2016-02-20 DIAGNOSIS — M79601 Pain in right arm: Secondary | ICD-10-CM | POA: Diagnosis not present

## 2016-02-20 NOTE — Progress Notes (Signed)
Location:  [place] Room Number: 907 Place of Service:  SNF [phone]) Provider:  Marlowe Sax, FNP-C  Blanchie Serve, MD  Patient Care Team: Blanchie Serve, MD as PCP - General (Internal Medicine)  Extended Emergency Contact Information Primary Emergency Contact: Jari Pigg of [place] Phone: [phone] Work Phone: [phone] Relation: Friend Secondary Emergency Contact: [place] of [place] Phone: [phone] Relation: Friend  Code Status:  Full Code  Goals of care: Advanced Directive information Advanced Directives 11/22/2015  Does patient have an advance directive? No  Would patient like information on creating an advanced directive? No - patient declined information  Pre-existing out of facility DNR order (yellow form or pink MOST form) -     Chief Complaint  Patient presents with  . Acute Visit     Right Arm Pain    HPI:  Pt is a 82 y.o. female seen today at [facility]  for an acute visit for evaluation of right arm pain.She is seen in her room today per patient's nurse request.She complains of right arm pain worst with movement. Tylenol ineffective for pain. She denies any fever, chills or injury to arm.    Past Medical History  Diagnosis Date  . CARCINOID TUMOR 04/13/2007  . HYPERTENSION 04/13/2007  . ALLERGIC RHINITIS 04/13/2007  . GERD 04/13/2007  . HYPERGLYCEMIA 04/13/2007  . CHOLELITHIASIS 05/02/2010  . Arthritis   . Cancer ([place])   . Environmental allergies     has mucus in throat  . Diphtheria     age 64  . CAD (coronary artery disease)     NSTEMI.  LHC 09/03/11: pLAD 40-50%, pDx 80-90% (very small), lateral branch of OM 99%, m-dCFX 40%, oRCA 80%, m-dRCA 50-70%, EF 55%.  She had PCI with BMS to lateral branch of the OM.  RCA was tx medically.   . Hyponatremia   . Murmur     echo 11/12: EF 55-60%, mod calcified AV annulus, mild AI, mild MR  .  Dermatophytosis of nail   . UTI (urinary tract infection) 03/2015   Past Surgical History  Procedure Laterality Date  . Tonsillectomy      many years ago  . Cardiac surgery    . Colon surgery      CANCER  . Small bowel repair    . Left heart catheterization with coronary angiogram N/A 09/03/2011    Procedure: LEFT HEART CATHETERIZATION WITH CORONARY ANGIOGRAM;  Surgeon: Peter M Martinique, MD;  Location: [facility] CATH LAB;  Service: Cardiovascular;  Laterality: N/A;  . Percutaneous coronary stent intervention (pci-s)  09/03/2011    Procedure: PERCUTANEOUS CORONARY STENT INTERVENTION (PCI-S);  Surgeon: Peter M Martinique, MD;  Location: [facility] CATH LAB;  Service: Cardiovascular;;    Allergies  Allergen Reactions  . Ciprofloxacin     emesis  . Paxil [Paroxetine Hcl] Nausea And Vomiting      Medication List       This list is accurate as of: 02/20/16 11:59 PM.  Always use your most recent med list.               acetaminophen 500 MG tablet  Commonly known as:  TYLENOL  Take 1,000 mg by mouth 2 (two) times daily.     amLODipine 10 MG tablet  Commonly known as:  NORVASC  Take 10 mg by mouth daily.     aspirin 81 MG tablet  Take 81 mg by mouth daily.  atorvastatin 10 MG tablet  Commonly known as:  LIPITOR  Take 10 mg by mouth daily.     bisacodyl 10 MG suppository  Commonly known as:  DULCOLAX  Place 1 suppository (10 mg total) rectally daily as needed for moderate constipation.     calcium-vitamin D 500-200 MG-UNIT tablet  Commonly known as:  OSCAL WITH D  Take 1 tablet by mouth 2 (two) times daily.     docusate sodium 100 MG capsule  Commonly known as:  COLACE  Take 1 capsule (100 mg total) by mouth 2 (two) times daily.     metoprolol 50 MG tablet  Commonly known as:  LOPRESSOR  Take 1 tablet (50 mg total) by mouth 2 (two) times daily.     MULTI VITAMIN DAILY Tabs  Take 1 tablet by mouth daily. Pt. Not sure of dose.     nitroGLYCERIN 0.4 MG SL tablet  Commonly known as:   NITROSTAT  Place 1 tablet (0.4 mg total) under the tongue every 5 (five) minutes as needed for chest pain.     nystatin cream  Commonly known as:  MYCOSTATIN  Apply 1 application topically 2 (two) times daily. Apply to groin and buttock for fungal rash     pantoprazole 40 MG tablet  Commonly known as:  PROTONIX  Take 1 tablet (40 mg total) by mouth daily.     pregabalin 50 MG capsule  Commonly known as:  LYRICA  Take one capsule by mouth three times daily for neuropathy     senna-docusate 8.6-50 MG tablet  Commonly known as:  Senokot-S  Take 1 tablet by mouth at bedtime as needed for mild constipation.     sodium chloride 1 g tablet  Take 1 g by mouth 2 (two) times daily with a meal.     traMADol 50 MG tablet  Commonly known as:  ULTRAM  1 tab (50 mg)  by mouth for pain 2-5/10 every 6 hours , 2 tabs =  (100 mg) by mouth for pain 6-10/10 every 6 hours as needed     valsartan 320 MG tablet  Commonly known as:  DIOVAN  Take 1 tablet (320 mg total) by mouth daily.     zinc oxide 11.3 % Crea cream  Commonly known as:  BALMEX  Apply cream topically to buttocks for mild redness twice a day.        Review of Systems  Constitutional: Negative for fever, chills, activity change and appetite change.  Respiratory: Negative for cough, shortness of breath and wheezing.   Cardiovascular: Negative for chest pain, palpitations and leg swelling.  Gastrointestinal: Negative for vomiting, abdominal pain, diarrhea, constipation and abdominal distention.  Musculoskeletal: Positive for gait problem. Negative for joint swelling.       Right arm pain   Skin: Negative.   Psychiatric/Behavioral: Negative for confusion, sleep disturbance and agitation.    Immunization History  Administered Date(s) Administered  . Influenza Split 07/15/2011, 07/02/2012  . Influenza Whole 08/19/2007, 07/29/2008  . Influenza,inj,Quad PF,36+ Mos 10/12/2013  . Influenza-Unspecified 08/10/2015  . PPD Test  07/15/2015, 07/29/2015  . Pneumococcal Conjugate-13 12/27/2014  . Pneumococcal Polysaccharide-23 08/22/2003  . Pneumococcal-Unspecified 08/27/2015  . Tdap 05/30/2011   Pertinent  Health Maintenance Due  Topic Date Due  . DEXA SCAN  12/26/2017 (Originally 12/22/1986)  . INFLUENZA VACCINE  05/21/2016  . PNA vac Low Risk Adult  Completed   Fall Risk  12/27/2014  Falls in the past year? No   Functional Status  Survey:    Filed Vitals:   02/20/16 1040  BP: 134/63  Pulse: 68  Temp: 98.6 F (37 C)  Resp: 20  Height: 5\' 7"  (1.702 m)  Weight: 109 lb 4.8 oz (49.578 kg)  SpO2: 96%   Body mass index is 17.11 kg/(m^2). Physical Exam  Constitutional: She appears well-developed and well-nourished. No distress.  Elderly   HENT:  Head: Normocephalic.  Mouth/Throat: Oropharynx is clear and moist. No oropharyngeal exudate.  Eyes: Conjunctivae and EOM are normal. Pupils are equal, round, and reactive to light. Right eye exhibits no discharge. Left eye exhibits no discharge. No scleral icterus.  Neck: Normal range of motion. No JVD present. No thyromegaly present.  Cardiovascular: Normal rate, regular rhythm, normal heart sounds and intact distal pulses.  Exam reveals no gallop and no friction rub.   No murmur heard. Pulmonary/Chest: Effort normal and breath sounds normal. No respiratory distress. She has no wheezes. She has no rales.  Abdominal: Soft. Bowel sounds are normal. She exhibits no distension. There is no tenderness. There is no rebound and no guarding.  Musculoskeletal: She exhibits no edema or tenderness.  Moves X 4 extremities except right forearm pain worst with movement.   Lymphadenopathy:    She has no cervical adenopathy.  Neurological: She is alert.  Skin: Skin is warm and dry. No rash noted. No erythema. No pallor.  Psychiatric: She has a normal mood and affect.    Labs reviewed:  Recent Labs  05/30/15 0431  05/31/15 0342  06/01/15 0511  07/14/15 0710  07/14/15 1256 07/15/15 0559  10/26/15 12/21/15 01/24/16  NA 126*  < > 125*  < > 127*  < > 126* 128* 130*  < > 125* 128* 129*  K 4.6  < > 4.6  < > 4.8  < > 4.4 4.5 4.5  < > 4.8 4.6 4.2  CL 92*  < > 91*  < > 89*  < > 97* 100* 94*  --   --   --   --   CO2 25  < > 25  < > 27  < > 25 25 29   --   --   --   --   GLUCOSE 113*  < > 101*  < > 92  < > 100* 105* 95  --   --   --   --   BUN 13  < > 19  < > 22*  < > 9 8 7   < > 13 15 9   CREATININE 0.96  < > 1.01*  < > 0.99  < > 0.82 0.81 0.84  < > 0.6 0.7 0.7  CALCIUM 8.8*  < > 8.6*  < > 9.0  < > 8.7* 8.9 9.5  --   --   --   --   MG 1.7  --  1.8  --  1.8  --   --   --   --   --   --   --   --   < > = values in this interval not displayed.  Recent Labs  05/31/15 0342 06/01/15 0511 07/11/15 1716 07/24/15 10/26/15  AST 21 23 29 16 27   ALT 15 18 21 18 29   ALKPHOS 93 89 102 112 98  BILITOT 0.8 0.9 0.7  --   --   PROT 6.4* 6.4* 7.3  --   --   ALBUMIN 2.4* 2.4* 3.4*  --   --     Recent Labs  05/31/15  [id number] 06/01/15 0511 07/11/15 1716 07/12/15 0532 07/13/15 0624 07/14/15 0710  12/21/15 01/24/16 03/21/16  WBC 10.3 7.6 5.3 4.3 4.2 4.2  < > 6.1 4.6 7.2  NEUTROABS 7.4 5.3 4.2  --   --   --   --   --   --   --   HGB 16.4* 16.4* 17.5* 17.0* 15.7* 15.2*  < > 13.9 12.7 11.8*  HCT 46.7* 46.7* 49.6* 48.1* 45.5 44.5  < > 42 39 37  MCV 90.0 91.0 91.2 91.1 93.2 93.9  --   --   --   --   PLT 403* 397 426* 358 367 325  < > 536* 474* 587*  < > = values in this interval not displayed. Lab Results  Component Value Date   TSH 3.697 05/30/2015   Lab Results  Component Value Date   HGBA1C 6.0 12/02/2008   Lab Results  Component Value Date   CHOL 148 01/24/2016   HDL 58 01/24/2016   LDLCALC 77 01/24/2016   LDLDIRECT 141.6 12/02/2008   TRIG 65 01/24/2016   CHOLHDL 2 10/05/2014    Significant Diagnostic Results in last 30 days:  No results found.  Assessment/Plan Right arm pain  Afebrile. worst with movement.Portable Right forearm X-ray 2  views    Family/ staff Communication: Reviewed plan with patient and facility Nurse supervisor  Labs/tests ordered: Portable Right forearm X-ray 2 views

## 2016-02-26 ENCOUNTER — Non-Acute Institutional Stay (SKILLED_NURSING_FACILITY): Payer: Medicare Other | Admitting: Family

## 2016-02-26 ENCOUNTER — Encounter: Payer: Self-pay | Admitting: Family

## 2016-02-26 DIAGNOSIS — E871 Hypo-osmolality and hyponatremia: Secondary | ICD-10-CM | POA: Diagnosis not present

## 2016-02-26 DIAGNOSIS — K5901 Slow transit constipation: Secondary | ICD-10-CM | POA: Diagnosis not present

## 2016-02-26 DIAGNOSIS — E785 Hyperlipidemia, unspecified: Secondary | ICD-10-CM | POA: Diagnosis not present

## 2016-02-26 DIAGNOSIS — I110 Hypertensive heart disease with heart failure: Secondary | ICD-10-CM

## 2016-02-26 DIAGNOSIS — K219 Gastro-esophageal reflux disease without esophagitis: Secondary | ICD-10-CM

## 2016-02-26 NOTE — Progress Notes (Signed)
Location:  [facility] Room Number: 907 P Place of Service:  SNF [phone]) Provider:  Marlowe Sax, FNP-C  Garret Reddish, MD  Patient Care Team: Marin Olp, MD as PCP - General (Family Medicine)  Extended Emergency Contact Information Primary Emergency Contact: Jari Pigg of [place] Phone: [phone] Work Phone: [phone] Relation: Friend Secondary Emergency Contact: Barnhart of [place] Phone: [phone] Relation: Friend  Code Status: Full Code  Goals of care: Advanced Directive information Advanced Directives 11/22/2015  Does patient have an advance directive? No  Would patient like information on creating an advanced directive? No - patient declined information  Pre-existing out of facility DNR order (yellow form or pink MOST form) -     Chief Complaint  Patient presents with  . Medical Management of Chronic Issues    Routine Visit    HPI:  Pt is a 82 y.o. female seen today at [facility] for medical management of chronic diseases. She has a medical history of HTN, Hyperlipidemia, GERD, Chronic Hyponatremia, constipation, chronic pain among others. She is seen in her room today.She denies any recent any acute issues this visit. No recent fall episodes or recent hospital admissions. Facility Nurse reports no new concerns. She contineus to require total care assistance with ADL's .    Past Medical History  Diagnosis Date  . CARCINOID TUMOR 04/13/2007  . HYPERTENSION 04/13/2007  . ALLERGIC RHINITIS 04/13/2007  . GERD 04/13/2007  . HYPERGLYCEMIA 04/13/2007  . CHOLELITHIASIS 05/02/2010  . Arthritis   . Cancer ([place])   . Environmental allergies     has mucus in throat  . Diphtheria     age 71  . CAD (coronary artery disease)     NSTEMI.  LHC 09/03/11: pLAD 40-50%, pDx 80-90% (very small), lateral branch of OM 99%, m-dCFX 40%, oRCA 80%, m-dRCA 50-70%, EF 55%.  She  had PCI with BMS to lateral branch of the OM.  RCA was tx medically.   . Hyponatremia   . Murmur     echo 11/12: EF 55-60%, mod calcified AV annulus, mild AI, mild MR  . Dermatophytosis of nail   . UTI (urinary tract infection) 03/2015   Past Surgical History  Procedure Laterality Date  . Tonsillectomy      many years ago  . Cardiac surgery    . Colon surgery      CANCER  . Small bowel repair    . Left heart catheterization with coronary angiogram N/A 09/03/2011    Procedure: LEFT HEART CATHETERIZATION WITH CORONARY ANGIOGRAM;  Surgeon: Peter M Martinique, MD;  Location: [facility] CATH LAB;  Service: Cardiovascular;  Laterality: N/A;  . Percutaneous coronary stent intervention (pci-s)  09/03/2011    Procedure: PERCUTANEOUS CORONARY STENT INTERVENTION (PCI-S);  Surgeon: Peter M Martinique, MD;  Location: [facility] CATH LAB;  Service: Cardiovascular;;    Allergies  Allergen Reactions  . Ciprofloxacin     emesis  . Paxil [Paroxetine Hcl] Nausea And Vomiting      Medication List       This list is accurate as of: 02/26/16  8:37 AM.  Always use your most recent med list.               acetaminophen 500 MG tablet  Commonly known as:  TYLENOL  Take 1,000 mg by mouth 2 (two) times daily.     amLODipine 10 MG tablet  Commonly known as:  NORVASC  Take 10 mg by mouth daily.     aspirin 81 MG tablet  Take 81 mg by mouth daily.     atorvastatin 10 MG tablet  Commonly known as:  LIPITOR  Take 10 mg by mouth daily.     bisacodyl 10 MG suppository  Commonly known as:  DULCOLAX  Place 1 suppository (10 mg total) rectally daily as needed for moderate constipation.     calcium-vitamin D 500-200 MG-UNIT tablet  Commonly known as:  OSCAL WITH D  Take 1 tablet by mouth 2 (two) times daily.     docusate sodium 100 MG capsule  Commonly known as:  COLACE  Take 1 capsule (100 mg total) by mouth 2 (two) times daily.     metoprolol 50 MG tablet  Commonly known as:  LOPRESSOR  Take 1 tablet (50 mg  total) by mouth 2 (two) times daily.     MULTI VITAMIN DAILY Tabs  Take 1 tablet by mouth daily. Pt. Not sure of dose.     nitroGLYCERIN 0.4 MG SL tablet  Commonly known as:  NITROSTAT  Place 1 tablet (0.4 mg total) under the tongue every 5 (five) minutes as needed for chest pain.     nystatin cream  Commonly known as:  MYCOSTATIN  Apply 1 application topically 2 (two) times daily. Apply to groin and buttock for fungal rash     pantoprazole 40 MG tablet  Commonly known as:  PROTONIX  Take 1 tablet (40 mg total) by mouth daily.     pregabalin 50 MG capsule  Commonly known as:  LYRICA  Take one capsule by mouth three times daily for neuropathy     senna-docusate 8.6-50 MG tablet  Commonly known as:  Senokot-S  Take 1 tablet by mouth at bedtime as needed for mild constipation.     sodium chloride 1 g tablet  Take 1 g by mouth 2 (two) times daily with a meal.     traMADol 50 MG tablet  Commonly known as:  ULTRAM  1 tab (50 mg)  by mouth for pain 2-5/10 every 6 hours , 2 tabs =  (100 mg) by mouth for pain 6-10/10 every 6 hours as needed     UNABLE TO FIND  Med Name: Med Pass take 166ml by mouth three times daily.     valsartan 320 MG tablet  Commonly known as:  DIOVAN  Take 1 tablet (320 mg total) by mouth daily.     zinc oxide 11.3 % Crea cream  Commonly known as:  BALMEX  Apply cream topically to buttocks for mild redness twice a day.        Review of Systems  Constitutional: Negative for activity change, appetite change, chills and fever.  HENT: Negative for congestion, rhinorrhea, sinus pressure and sore throat.   Eyes: Negative.   Respiratory: Negative for cough, shortness of breath and wheezing.   Cardiovascular: Negative for chest pain, palpitations and leg swelling.  Gastrointestinal: Negative for abdominal distention, abdominal pain, constipation, diarrhea and vomiting.  Endocrine: Negative.   Genitourinary: Negative for dysuria, flank pain, frequency and  urgency.  Musculoskeletal: Positive for gait problem. Negative for joint swelling.       Chronic Back and leg  pain   Skin: Negative.   Neurological: Negative for dizziness, seizures, light-headedness and headaches.  Hematological: Does not bruise/bleed easily.  Psychiatric/Behavioral: Negative for agitation, confusion and sleep disturbance.    Immunization History  Administered Date(s) Administered  . Influenza Split 07/15/2011,  07/02/2012  . Influenza Whole 08/19/2007, 07/29/2008  . Influenza,inj,Quad PF,36+ Mos 10/12/2013  . Influenza-Unspecified 08/10/2015  . PPD Test 07/15/2015, 07/29/2015  . Pneumococcal Conjugate-13 12/27/2014  . Pneumococcal Polysaccharide-23 08/22/2003  . Pneumococcal-Unspecified 08/27/2015  . Tdap 05/30/2011   Pertinent  Health Maintenance Due  Topic Date Due  . DEXA SCAN  12/26/2017 (Originally 12/22/1986)  . INFLUENZA VACCINE  05/21/2016  . PNA vac Low Risk Adult  Completed   Fall Risk  12/27/2014  Falls in the past year? No      Filed Vitals:   02/26/16 0818  BP: 134/63  Pulse: 68  Temp: 98.6 F (37 C)  Resp: 20  Height: 5\' 7"  (1.702 m)  Weight: 108 lb 11.2 oz (49.306 kg)  SpO2: 96%   Body mass index is 17.02 kg/(m^2). Physical Exam  Constitutional: She appears well-developed and well-nourished. No distress.  Pleasant elderly in no acute distress   HENT:  Head: Normocephalic.  Mouth/Throat: Oropharynx is clear and moist. No oropharyngeal exudate.  Eyes: Conjunctivae and EOM are normal. Pupils are equal, round, and reactive to light. Right eye exhibits no discharge. Left eye exhibits no discharge. No scleral icterus.  Neck: Normal range of motion. No JVD present. No thyromegaly present.  Cardiovascular: Normal rate, regular rhythm, normal heart sounds and intact distal pulses.  Exam reveals no gallop and no friction rub.   No murmur heard. Pulmonary/Chest: Effort normal and breath sounds normal. No respiratory distress. She has no  wheezes. She has no rales.  Abdominal: Soft. Bowel sounds are normal. She exhibits no distension. There is no tenderness. There is no rebound and no guarding.  Genitourinary:  Genitourinary Comments: Incontinent for both B/B   Musculoskeletal: She exhibits no edema or tenderness.  Moves X 4 extremities.   Lymphadenopathy:    She has no cervical adenopathy.  Neurological: She is alert.  Skin: Skin is warm and dry. No rash noted. No erythema. No pallor.  Psychiatric: She has a normal mood and affect.    Labs reviewed:  Recent Labs  05/30/15 0431  05/31/15 0342  06/01/15 0511  07/14/15 0710 07/14/15 1256 07/15/15 0559  10/26/15 12/21/15 01/24/16  NA 126*  < > 125*  < > 127*  < > 126* 128* 130*  < > 125* 128* 129*  K 4.6  < > 4.6  < > 4.8  < > 4.4 4.5 4.5  < > 4.8 4.6 4.2  CL 92*  < > 91*  < > 89*  < > 97* 100* 94*  --   --   --   --   CO2 25  < > 25  < > 27  < > 25 25 29   --   --   --   --   GLUCOSE 113*  < > 101*  < > 92  < > 100* 105* 95  --   --   --   --   BUN 13  < > 19  < > 22*  < > 9 8 7   < > 13 15 9   CREATININE 0.96  < > 1.01*  < > 0.99  < > 0.82 0.81 0.84  < > 0.6 0.7 0.7  CALCIUM 8.8*  < > 8.6*  < > 9.0  < > 8.7* 8.9 9.5  --   --   --   --   MG 1.7  --  1.8  --  1.8  --   --   --   --   --   --   --   --   < > =  values in this interval not displayed.  Recent Labs  05/31/15 0342 06/01/15 0511 07/11/15 1716 07/24/15 10/26/15  AST 21 23 29 16 27   ALT 15 18 21 18 29   ALKPHOS 93 89 102 112 98  BILITOT 0.8 0.9 0.7  --   --   PROT 6.4* 6.4* 7.3  --   --   ALBUMIN 2.4* 2.4* 3.4*  --   --     Recent Labs  05/31/15 0342 06/01/15 0511 07/11/15 1716 07/12/15 0532 07/13/15 0624 07/14/15 0710  10/26/15 12/21/15 01/24/16  WBC 10.3 7.6 5.3 4.3 4.2 4.2  < > 6.0 6.1 4.6  NEUTROABS 7.4 5.3 4.2  --   --   --   --   --   --   --   HGB 16.4* 16.4* 17.5* 17.0* 15.7* 15.2*  < > 14.8 13.9 12.7  HCT 46.7* 46.7* 49.6* 48.1* 45.5 44.5  < > 43 42 39  MCV 90.0 91.0 91.2 91.1 93.2  93.9  --   --   --   --   PLT 403* 397 426* 358 367 325  < > 617* 536* 474*  < > = values in this interval not displayed. Lab Results  Component Value Date   TSH 3.697 05/30/2015   Lab Results  Component Value Date   HGBA1C 6.0 12/02/2008   Lab Results  Component Value Date   CHOL 148 01/24/2016   HDL 58 01/24/2016   LDLCALC 77 01/24/2016   LDLDIRECT 141.6 12/02/2008   TRIG 65 01/24/2016   CHOLHDL 2 10/05/2014   Assessment/Plan HTN B/p stable. Will continue on Amlodipine and Metoprolol. Monitor BMP   GERD Stable. Continue on Protonix   Hyperlipidemia  Continue on Atorvastatin 10 mg tablet.  Chronic Hyponatremia Continue on sodium chloride. Monitor BMP  Constipation Current regimen effective. Continue to encourage oral  and Fluid intake.    Family/ staff Communication:Reviewed plan of care with patient and facility Nurse supervisor.   Labs/tests ordered: None

## 2016-03-20 ENCOUNTER — Non-Acute Institutional Stay (SKILLED_NURSING_FACILITY): Payer: Medicare Other | Admitting: Family

## 2016-03-20 DIAGNOSIS — R1030 Lower abdominal pain, unspecified: Secondary | ICD-10-CM | POA: Diagnosis not present

## 2016-03-20 DIAGNOSIS — R319 Hematuria, unspecified: Secondary | ICD-10-CM | POA: Diagnosis not present

## 2016-03-20 NOTE — Progress Notes (Signed)
Patient ID: Becky Morales, female   DOB: [birth year], 82 y.o.   MRN: [id number]  Location:  [place] of Service:  SNF [phone]) Provider: Myalee Stengel FNP-C  Blanchie Serve, MD   Garret Reddish, MD  Patient Care Team: Marin Olp, MD as PCP - General (Family Medicine)  Extended Emergency Contact Information Primary Emergency Contact: Jari Pigg of [place] Phone: [phone] Work Phone: [phone] Relation: Friend Secondary Emergency Contact: [place] of [place] Phone: [phone] Relation: Friend  Code Status:  Full code  Goals of care: Advanced Directive information Advanced Directives 11/22/2015  Does patient have an advance directive? No  Would patient like information on creating an advanced directive? No - patient declined information  Pre-existing out of facility DNR order (yellow form or pink MOST form) -     Chief Complaint  Patient presents with  . Acute Visit    blood noted in the diaper    HPI:  Pt is a 82 y.o. female seen today at [facility] for an acute visit for evaluation of moderate amount of blood in the brief. She has a significant medical history of uterine/Ovarian cancer,Lung cancer, HTN, Stroke among others. She is seen in her room today. She denies any acute issues. She is incontinent for both bowel and bladder and was notified by facility staff that she had blood in the diaper one day ago. She does complain of slight lower abdominal pain. She denies any fever, chills, diarrhea, nausea or vomiting.     Past Medical History  Diagnosis Date  . CARCINOID TUMOR 04/13/2007  . HYPERTENSION 04/13/2007  . ALLERGIC RHINITIS 04/13/2007  . GERD 04/13/2007  . HYPERGLYCEMIA 04/13/2007  . CHOLELITHIASIS 05/02/2010  . Arthritis   . Cancer ([place])   . Environmental allergies     has mucus in throat  . Diphtheria     age 17  . CAD (coronary artery disease)     NSTEMI.   LHC 09/03/11: pLAD 40-50%, pDx 80-90% (very small), lateral branch of OM 99%, m-dCFX 40%, oRCA 80%, m-dRCA 50-70%, EF 55%.  She had PCI with BMS to lateral branch of the OM.  RCA was tx medically.   . Hyponatremia   . Murmur     echo 11/12: EF 55-60%, mod calcified AV annulus, mild AI, mild MR  . Dermatophytosis of nail   . UTI (urinary tract infection) 03/2015   Past Surgical History  Procedure Laterality Date  . Tonsillectomy      many years ago  . Cardiac surgery    . Colon surgery      CANCER  . Small bowel repair    . Left heart catheterization with coronary angiogram N/A 09/03/2011    Procedure: LEFT HEART CATHETERIZATION WITH CORONARY ANGIOGRAM;  Surgeon: Peter M Martinique, MD;  Location: [facility] CATH LAB;  Service: Cardiovascular;  Laterality: N/A;  . Percutaneous coronary stent intervention (pci-s)  09/03/2011    Procedure: PERCUTANEOUS CORONARY STENT INTERVENTION (PCI-S);  Surgeon: Peter M Martinique, MD;  Location: [facility] CATH LAB;  Service: Cardiovascular;;    Allergies  Allergen Reactions  . Ciprofloxacin     emesis  . Paxil [Paroxetine Hcl] Nausea And Vomiting      Medication List       This list is accurate as of: 03/20/16  1:38 PM.  Always use your most recent med list.  acetaminophen 500 MG tablet  Commonly known as:  TYLENOL  Take 1,000 mg by mouth 2 (two) times daily.     amLODipine 10 MG tablet  Commonly known as:  NORVASC  Take 10 mg by mouth daily.     aspirin 81 MG tablet  Take 81 mg by mouth daily.     atorvastatin 10 MG tablet  Commonly known as:  LIPITOR  Take 10 mg by mouth daily.     bisacodyl 10 MG suppository  Commonly known as:  DULCOLAX  Place 1 suppository (10 mg total) rectally daily as needed for moderate constipation.     calcium-vitamin D 500-200 MG-UNIT tablet  Commonly known as:  OSCAL WITH D  Take 1 tablet by mouth 2 (two) times daily.     docusate sodium 100 MG capsule  Commonly known as:  COLACE  Take 1 capsule (100  mg total) by mouth 2 (two) times daily.     metoprolol 50 MG tablet  Commonly known as:  LOPRESSOR  Take 1 tablet (50 mg total) by mouth 2 (two) times daily.     MULTI VITAMIN DAILY Tabs  Take 1 tablet by mouth daily. Pt. Not sure of dose.     nitroGLYCERIN 0.4 MG SL tablet  Commonly known as:  NITROSTAT  Place 1 tablet (0.4 mg total) under the tongue every 5 (five) minutes as needed for chest pain.     pantoprazole 40 MG tablet  Commonly known as:  PROTONIX  Take 1 tablet (40 mg total) by mouth daily.     pregabalin 50 MG capsule  Commonly known as:  LYRICA  Take one capsule by mouth three times daily for neuropathy     senna-docusate 8.6-50 MG tablet  Commonly known as:  Senokot-S  Take 1 tablet by mouth at bedtime as needed for mild constipation.     sodium chloride 1 g tablet  Take 1 g by mouth 2 (two) times daily with a meal.     traMADol 50 MG tablet  Commonly known as:  ULTRAM  1 tab (50 mg)  by mouth for pain 2-5/10 every 6 hours , 2 tabs =  (100 mg) by mouth for pain 6-10/10 every 6 hours as needed     UNABLE TO FIND  Med Name: Med Pass take 157ml by mouth three times daily.     valsartan 320 MG tablet  Commonly known as:  DIOVAN  Take 1 tablet (320 mg total) by mouth daily.     zinc oxide 11.3 % Crea cream  Commonly known as:  BALMEX  Apply cream topically to buttocks for mild redness twice a day.        Review of Systems  Constitutional: Negative for fever, chills, activity change, appetite change and fatigue.  HENT: Negative for congestion, rhinorrhea, sinus pressure, sneezing and sore throat.   Eyes: Negative.   Respiratory: Negative for cough, chest tightness, shortness of breath and wheezing.   Cardiovascular: Negative for chest pain, palpitations and leg swelling.  Gastrointestinal: Negative for nausea, vomiting, diarrhea, constipation and abdominal distention.       Lower abdominal pain   Genitourinary: Positive for hematuria. Negative for  dysuria, urgency, frequency and flank pain.  Musculoskeletal: Positive for gait problem. Negative for back pain.  Skin: Negative.   Neurological: Negative for dizziness, weakness, light-headedness and headaches.  Hematological: Does not bruise/bleed easily.  Psychiatric/Behavioral: Negative.     Immunization History  Administered Date(s) Administered  . Influenza Split 07/15/2011,  07/02/2012  . Influenza Whole 08/19/2007, 07/29/2008  . Influenza,inj,Quad PF,36+ Mos 10/12/2013  . Influenza-Unspecified 08/10/2015  . PPD Test 07/15/2015, 07/29/2015  . Pneumococcal Conjugate-13 12/27/2014  . Pneumococcal Polysaccharide-23 08/22/2003  . Pneumococcal-Unspecified 08/27/2015  . Tdap 05/30/2011   Pertinent  Health Maintenance Due  Topic Date Due  . DEXA SCAN  12/26/2017 (Originally 12/22/1986)  . INFLUENZA VACCINE  05/21/2016  . PNA vac Low Risk Adult  Completed   Fall Risk  12/27/2014  Falls in the past year? No   Functional Status Survey:    Filed Vitals:   03/20/16 1329  BP: 124/76  Pulse: 80  Temp: 97.6 F (36.4 C)  Resp: 16  Height: 5\' 7"  (1.702 m)  Weight: 108 lb 11.2 oz (49.306 kg)  SpO2: 98%   Body mass index is 17.02 kg/(m^2). Physical Exam  Constitutional: She appears well-developed and well-nourished.  Elderly in no acute distress   HENT:  Head: Normocephalic.  Mouth/Throat: Oropharynx is clear and moist. No oropharyngeal exudate.  Eyes: Conjunctivae and EOM are normal. Pupils are equal, round, and reactive to light. Right eye exhibits no discharge. Left eye exhibits no discharge. No scleral icterus.  Neck: Normal range of motion. No JVD present. No thyromegaly present.  Cardiovascular: Normal rate, regular rhythm, normal heart sounds and intact distal pulses.  Exam reveals no gallop and no friction rub.   No murmur heard. Pulmonary/Chest: Effort normal and breath sounds normal. No respiratory distress. She has no wheezes. She has no rales.  Abdominal: Soft. Bowel  sounds are normal. She exhibits no distension and no mass. There is no rebound and no guarding.  Slight tenderness to deep palpation on suprapubic area.   Musculoskeletal: She exhibits no edema or tenderness.  Lymphadenopathy:    She has no cervical adenopathy.  Neurological: She is alert.  Skin: Skin is warm and dry. No rash noted. No erythema. No pallor.  Psychiatric: She has a normal mood and affect.    Labs reviewed:  Recent Labs  05/30/15 0431  05/31/15 0342  06/01/15 0511  07/14/15 0710 07/14/15 1256 07/15/15 0559  10/26/15 12/21/15 01/24/16  NA 126*  < > 125*  < > 127*  < > 126* 128* 130*  < > 125* 128* 129*  K 4.6  < > 4.6  < > 4.8  < > 4.4 4.5 4.5  < > 4.8 4.6 4.2  CL 92*  < > 91*  < > 89*  < > 97* 100* 94*  --   --   --   --   CO2 25  < > 25  < > 27  < > 25 25 29   --   --   --   --   GLUCOSE 113*  < > 101*  < > 92  < > 100* 105* 95  --   --   --   --   BUN 13  < > 19  < > 22*  < > 9 8 7   < > 13 15 9   CREATININE 0.96  < > 1.01*  < > 0.99  < > 0.82 0.81 0.84  < > 0.6 0.7 0.7  CALCIUM 8.8*  < > 8.6*  < > 9.0  < > 8.7* 8.9 9.5  --   --   --   --   MG 1.7  --  1.8  --  1.8  --   --   --   --   --   --   --   --   < > =  values in this interval not displayed.  Recent Labs  05/31/15 0342 06/01/15 0511 07/11/15 1716 07/24/15 10/26/15  AST 21 23 29 16 27   ALT 15 18 21 18 29   ALKPHOS 93 89 102 112 98  BILITOT 0.8 0.9 0.7  --   --   PROT 6.4* 6.4* 7.3  --   --   ALBUMIN 2.4* 2.4* 3.4*  --   --     Recent Labs  05/31/15 0342 06/01/15 0511 07/11/15 1716 07/12/15 0532 07/13/15 0624 07/14/15 0710  10/26/15 12/21/15 01/24/16  WBC 10.3 7.6 5.3 4.3 4.2 4.2  < > 6.0 6.1 4.6  NEUTROABS 7.4 5.3 4.2  --   --   --   --   --   --   --   HGB 16.4* 16.4* 17.5* 17.0* 15.7* 15.2*  < > 14.8 13.9 12.7  HCT 46.7* 46.7* 49.6* 48.1* 45.5 44.5  < > 43 42 39  MCV 90.0 91.0 91.2 91.1 93.2 93.9  --   --   --   --   PLT 403* 397 426* 358 367 325  < > 617* 536* 474*  < > = values in this  interval not displayed. Lab Results  Component Value Date   TSH 3.697 05/30/2015   Lab Results  Component Value Date   HGBA1C 6.0 12/02/2008   Lab Results  Component Value Date   CHOL 148 01/24/2016   HDL 58 01/24/2016   LDLCALC 77 01/24/2016   LDLDIRECT 141.6 12/02/2008   TRIG 65 01/24/2016   CHOLHDL 2 10/05/2014   Assessment/Plan 1. Hematuria Afebrile. Moderate amounts noted on diaper. Obtain Urine specimen for U/A and C/S r/o UTI. CBC/diff 03/21/2016 Monitor Vital signs Q shift X 1 week then resume previous orders.   2. Suprapubic pain, unspecified laterality Possible UTI though history of uterine and ovarian cancer. will obtain Urine specimen for U/A and C/S.   Family/ staff Communication: Reviewed plan with patient and Nurse supervisor.  Labs/tests ordered: Urine specimen for U/A and C/S.CBC/diff 03/21/2016

## 2016-03-21 LAB — CBC AND DIFFERENTIAL
HCT: 37 % (ref 36–46)
Hemoglobin: 11.8 g/dL — AB (ref 12.0–16.0)
Platelets: 587 10*3/uL — AB (ref 150–399)
WBC: 7.2 10*3/mL

## 2016-03-25 ENCOUNTER — Non-Acute Institutional Stay (SKILLED_NURSING_FACILITY): Payer: Medicare Other | Admitting: Internal Medicine

## 2016-03-25 ENCOUNTER — Encounter: Payer: Self-pay | Admitting: Internal Medicine

## 2016-03-25 DIAGNOSIS — K219 Gastro-esophageal reflux disease without esophagitis: Secondary | ICD-10-CM

## 2016-03-25 DIAGNOSIS — L8991 Pressure ulcer of unspecified site, stage 1: Secondary | ICD-10-CM

## 2016-03-25 DIAGNOSIS — N390 Urinary tract infection, site not specified: Secondary | ICD-10-CM

## 2016-03-25 DIAGNOSIS — B9620 Unspecified Escherichia coli [E. coli] as the cause of diseases classified elsewhere: Secondary | ICD-10-CM | POA: Diagnosis not present

## 2016-03-25 DIAGNOSIS — E871 Hypo-osmolality and hyponatremia: Secondary | ICD-10-CM | POA: Diagnosis not present

## 2016-03-25 DIAGNOSIS — E785 Hyperlipidemia, unspecified: Secondary | ICD-10-CM

## 2016-03-25 DIAGNOSIS — I110 Hypertensive heart disease with heart failure: Secondary | ICD-10-CM

## 2016-03-25 DIAGNOSIS — E46 Unspecified protein-calorie malnutrition: Secondary | ICD-10-CM | POA: Diagnosis not present

## 2016-03-25 NOTE — Progress Notes (Signed)
Patient ID: Becky Morales, female   DOB: 1920-01-11, 82 y.o.   MRN: [id number]     [facility]  PCP: Garret Reddish, MD  Code Status: Full Code  Allergies  Allergen Reactions  . Ciprofloxacin     emesis  . Paxil [Paroxetine Hcl] Nausea And Vomiting    Chief Complaint  Patient presents with  . Medical Management of Chronic Issues    Routine Visit     HPI:  82 y.o. patient is seen for routine visit. She has been at her baseline. No new concern from nursing staff. No fall reported. Per nursing, she has red non blanchable area to her buttock. She has history of pressure ulcer in the past.  She was having blood in her diaper 3 days back and was diagnosed with UTI. She is currently on antibiotics. Per staff, patient is in bed all day and refuses to get out of the bed. No further blood noted in the diaper  Review of Systems:  Constitutional: Negative for fever, chills and diaphoresis.  HENT: Negative for congestion, hearing loss and sore throat.   Eyes: Negative for blurred vision, double vision and discharge.  Respiratory: Negative for cough, shortness of breath and wheezing.   Cardiovascular: Negative for chest pain, palpitations, leg swelling.  Gastrointestinal: Negative for heartburn, nausea, vomiting, abdominal pain. Had bowel movement 03/24/16. Genitourinary: positive for dysuria.  Musculoskeletal: Negative for falls. Skin: Negative for itching and rash.  Neurological: Negative for dizziness.  Psychiatric/Behavioral: Negative for depression.      Past Medical History  Diagnosis Date  . CARCINOID TUMOR 04/13/2007  . HYPERTENSION 04/13/2007  . ALLERGIC RHINITIS 04/13/2007  . GERD 04/13/2007  . HYPERGLYCEMIA 04/13/2007  . CHOLELITHIASIS 05/02/2010  . Arthritis   . Cancer ([place])   . Environmental allergies     has mucus in throat  . Diphtheria     age 82  . CAD (coronary artery disease)     NSTEMI.  LHC 09/03/11: pLAD 40-50%, pDx 80-90% (very small), lateral  branch of OM 99%, m-dCFX 40%, oRCA 80%, m-dRCA 50-70%, EF 55%.  She had PCI with BMS to lateral branch of the OM.  RCA was tx medically.   . Hyponatremia   . Murmur     echo 11/12: EF 55-60%, mod calcified AV annulus, mild AI, mild MR  . Dermatophytosis of nail   . UTI (urinary tract infection) 03/2015   Past Surgical History  Procedure Laterality Date  . Tonsillectomy      many years ago  . Cardiac surgery    . Colon surgery      CANCER  . Small bowel repair    . Left heart catheterization with coronary angiogram N/A 09/03/2011    Procedure: LEFT HEART CATHETERIZATION WITH CORONARY ANGIOGRAM;  Surgeon: Peter M Martinique, MD;  Location: [facility] CATH LAB;  Service: Cardiovascular;  Laterality: N/A;  . Percutaneous coronary stent intervention (pci-s)  09/03/2011    Procedure: PERCUTANEOUS CORONARY STENT INTERVENTION (PCI-S);  Surgeon: Peter M Martinique, MD;  Location: [facility] CATH LAB;  Service: Cardiovascular;;   Social History:   reports that she has never smoked. She has never used smokeless tobacco. She reports that she does not drink alcohol or use illicit drugs.  Family History  Problem Relation Age of Onset  . Hypertension Mother   . Uterine cancer Mother   . Ovarian cancer Mother   . Hypertension Father   . Stroke Father   . Hypertension Brother   .  Hyperlipidemia Brother   . Heart attack Brother   . Lung cancer Sister     Medications:   Medication List       This list is accurate as of: 03/25/16  2:36 PM.  Always use your most recent med list.               acetaminophen 500 MG tablet  Commonly known as:  TYLENOL  Take 1,000 mg by mouth 2 (two) times daily.     amLODipine 10 MG tablet  Commonly known as:  NORVASC  Take 10 mg by mouth daily.     aspirin 81 MG tablet  Take 81 mg by mouth daily.     atorvastatin 10 MG tablet  Commonly known as:  LIPITOR  Take 10 mg by mouth daily.     bisacodyl 10 MG suppository  Commonly known as:  DULCOLAX  Place 1 suppository (10  mg total) rectally daily as needed for moderate constipation.     calcium-vitamin D 500-200 MG-UNIT tablet  Commonly known as:  OSCAL WITH D  Take 1 tablet by mouth 2 (two) times daily.     docusate sodium 100 MG capsule  Commonly known as:  COLACE  Take 1 capsule (100 mg total) by mouth 2 (two) times daily.     doxycycline 100 MG EC tablet  Commonly known as:  DORYX  Take 100 mg by mouth 2 (two) times daily. Stop date 04/03/16     metoprolol 50 MG tablet  Commonly known as:  LOPRESSOR  Take 1 tablet (50 mg total) by mouth 2 (two) times daily.     MULTI VITAMIN DAILY Tabs  Take 1 tablet by mouth daily. Pt. Not sure of dose.     nitroGLYCERIN 0.4 MG SL tablet  Commonly known as:  NITROSTAT  Place 1 tablet (0.4 mg total) under the tongue every 5 (five) minutes as needed for chest pain.     pantoprazole 40 MG tablet  Commonly known as:  PROTONIX  Take 1 tablet (40 mg total) by mouth daily.     pregabalin 50 MG capsule  Commonly known as:  LYRICA  Take one capsule by mouth three times daily for neuropathy     saccharomyces boulardii 250 MG capsule  Commonly known as:  FLORASTOR  Take 250 mg by mouth 2 (two) times daily. Stop date 04/03/16     senna-docusate 8.6-50 MG tablet  Commonly known as:  Senokot-S  Take 1 tablet by mouth at bedtime as needed for mild constipation.     sodium chloride 1 g tablet  Take 1 g by mouth 2 (two) times daily with a meal.     traMADol 50 MG tablet  Commonly known as:  ULTRAM  1 tab (50 mg)  by mouth for pain 2-5/10 every 6 hours , 2 tabs =  (100 mg) by mouth for pain 6-10/10 every 6 hours as needed     UNABLE TO FIND  Med Name: Med Pass take 145ml by mouth three times daily.     valsartan 320 MG tablet  Commonly known as:  DIOVAN  Take 1 tablet (320 mg total) by mouth daily.     zinc oxide 11.3 % Crea cream  Commonly known as:  BALMEX  Apply cream topically to buttocks for mild redness twice a day.         Physical Exam: Filed  Vitals:   03/25/16 1234  BP: 132/52  Pulse: 67  Temp: 97.9 F (  36.6 C)  TempSrc: Oral  Resp: 19  Height: 5\' 7"  (1.702 m)  Weight: 108 lb 11.2 oz (49.306 kg)  SpO2: 98%  Body mass index is 17.02 kg/(m^2).   General- elderly female, thin built in no acute distress Head- normocephalic, atraumatic Throat- moist mucus membrane Eyes- no pallor, no icterus, no discharge, normal conjunctiva, normal sclera Neck- no cervical lymphadenopathy Cardiovascular- normal s1,s2, no murmurs, trace leg edema Respiratory- bilateral clear to auscultation, no wheeze, no rhonchi, no crackles, no use of accessory muscles Abdomen- bowel sounds present, soft, non tender Musculoskeletal- Right leg ROM limited with her severe OA. Able to move her extremities. Generalized weakness present Neurological- aao x 3 Skin- warm and dry, non blanchable redness to her buttock   Labs reviewed:  CBC Latest Ref Rng 03/21/2016 01/24/2016 12/21/2015  WBC - 7.2 4.6 6.1  Hemoglobin 12.0 - 16.0 g/dL 11.8(A) 12.7 13.9  Hematocrit 36 - 46 % 37 39 42  Platelets 150 - 399 K/L 587(A) 474(A) 536(A)   CMP Latest Ref Rng 01/24/2016 12/21/2015 10/26/2015  Glucose 65 - 99 mg/dL - - -  BUN 4 - 21 mg/dL 9 15 13   Creatinine 0.5 - 1.1 mg/dL 0.7 0.7 0.6  Sodium 137 - 147 mmol/L 129(A) 128(A) 125(A)  Potassium 3.4 - 5.3 mmol/L 4.2 4.6 4.8  Chloride 101 - 111 mmol/L - - -  CO2 22 - 32 mmol/L - - -  Calcium 8.9 - 10.3 mg/dL - - -  Alkaline Phos 25 - 125 U/L - - 98  AST 13 - 35 U/L - - 27  ALT 7 - 35 U/L - - 29   Liver Function Tests:  Recent Labs  05/31/15 0342 06/01/15 0511 07/11/15 1716 07/24/15 10/26/15  AST 21 23 29 16 27   ALT 15 18 21 18 29   ALKPHOS 93 89 102 112 98  BILITOT 0.8 0.9 0.7  --   --   PROT 6.4* 6.4* 7.3  --   --   ALBUMIN 2.4* 2.4* 3.4*  --   --      Radiological Exams: Dg Chest 2 View  07/11/2015   CLINICAL DATA:  Fall from bed.  Right hip pain.  EXAM: CHEST  2 VIEW  COMPARISON:  05/29/2015  FINDINGS: The  heart size and mediastinal contours are within normal limits. Both lungs are clear. The bones appear diffusely osteopenic. There is no acute fracture or subluxation identified.  IMPRESSION: No active cardiopulmonary disease.   Electronically Signed   By: Kerby Moors M.D.   On: 07/11/2015 17:52   Ct Head Wo Contrast  07/11/2015   CLINICAL DATA:  Pt is from home, fell out of bed, right hip pain today, left sided temporal abrasion. Denies LOC, no blood thinners. No obvious deformity. Pt usually uses a walker at home, is unsteady on her feet. Pt also had a fall on Saturday  EXAM: CT HEAD WITHOUT CONTRAST  CT CERVICAL SPINE WITHOUT CONTRAST  TECHNIQUE: Multidetector CT imaging of the head and cervical spine was performed following the standard protocol without intravenous contrast. Multiplanar CT image reconstructions of the cervical spine were also generated.  COMPARISON:  05/25/2015  FINDINGS: CT HEAD FINDINGS  Partial opacification of right mastoid air cells as before. Atherosclerotic and physiologic intracranial calcifications. Diffuse parenchymal atrophy. Prominence of lateral ventricles as before. Patchy areas of hypoattenuation in deep and periventricular white matter bilaterally. Negative for acute intracranial hemorrhage, mass lesion, acute infarction, midline shift, or mass-effect. Acute infarct may be inapparent on  noncontrast CT. Ventricles and sulci symmetric. Bone windows demonstrate no focal lesion.  CT CERVICAL SPINE FINDINGS  Normal alignment. No prevertebral soft tissue swelling. Facets are seated. Mild narrowing of the C2-3 and C5-6 interspaces. Fairly exuberant degenerative change at the atlantoaxial articulation. Anterior and posterior endplate spurring [id number]. Negative for fracture. Bilateral carotid calcified plaque. Scarring in the visualized lung apices. Calcification in the apical segment left upper lobe, incompletely visualized.  IMPRESSION: 1. Negative for bleed or other acute  intracranial process. 2. Atrophy and nonspecific white matter changes as before. 3. Negative for cervical fracture or other acute bone abnormality. 4. Multilevel cervical degenerative changes as above.   Electronically Signed   By: Lucrezia Europe M.D.   On: 07/11/2015 18:01   Ct Cervical Spine Wo Contrast  07/11/2015   CLINICAL DATA:  Pt is from home, fell out of bed, right hip pain today, left sided temporal abrasion. Denies LOC, no blood thinners. No obvious deformity. Pt usually uses a walker at home, is unsteady on her feet. Pt also had a fall on Saturday  EXAM: CT HEAD WITHOUT CONTRAST  CT CERVICAL SPINE WITHOUT CONTRAST  TECHNIQUE: Multidetector CT imaging of the head and cervical spine was performed following the standard protocol without intravenous contrast. Multiplanar CT image reconstructions of the cervical spine were also generated.  COMPARISON:  05/25/2015  FINDINGS: CT HEAD FINDINGS  Partial opacification of right mastoid air cells as before. Atherosclerotic and physiologic intracranial calcifications. Diffuse parenchymal atrophy. Prominence of lateral ventricles as before. Patchy areas of hypoattenuation in deep and periventricular white matter bilaterally. Negative for acute intracranial hemorrhage, mass lesion, acute infarction, midline shift, or mass-effect. Acute infarct may be inapparent on noncontrast CT. Ventricles and sulci symmetric. Bone windows demonstrate no focal lesion.  CT CERVICAL SPINE FINDINGS  Normal alignment. No prevertebral soft tissue swelling. Facets are seated. Mild narrowing of the C2-3 and C5-6 interspaces. Fairly exuberant degenerative change at the atlantoaxial articulation. Anterior and posterior endplate spurring [id number]. Negative for fracture. Bilateral carotid calcified plaque. Scarring in the visualized lung apices. Calcification in the apical segment left upper lobe, incompletely visualized.  IMPRESSION: 1. Negative for bleed or other acute intracranial process. 2.  Atrophy and nonspecific white matter changes as before. 3. Negative for cervical fracture or other acute bone abnormality. 4. Multilevel cervical degenerative changes as above.   Electronically Signed   By: Lucrezia Europe M.D.   On: 07/11/2015 18:01   Dg Hip Unilat With Pelvis 2-3 Views Right  07/11/2015   CLINICAL DATA:  Right hip pain  EXAM: DG HIP (WITH OR WITHOUT PELVIS) 2-3V RIGHT  COMPARISON:  05/26/2015  FINDINGS: There is foreshortening of the right femoral neck which is suspicious for a subcapital femoral neck fracture. There is advanced osteoarthritis involving the right hip joint with near bone-on-bone contact. Marginal spur formation is noted. No dislocation.  IMPRESSION: 1. Findings are worrisome for impacted subcapital femoral neck fracture involving the right hip. Recommend confirmatory imaging with right hip CT or MRI. 2. Osteopenia. 3. Degenerative joint disease.   Electronically Signed   By: Kerby Moors M.D.   On: 07/11/2015 17:49     Assessment/Plan  E.coli UTI Started on doxycycline 100 mg bid x 10 days with florastor, to encourage hydration. Monitor temperature curve  Stage 1 PU To her buttock and has hx of pU in past. Get air mattress. Repositioning required. Encouraged to be OOB.  Protein calorie malnutrition Body mass index is 17.02 kg/(m^2). Encourage po intake, monitor weight.  Continue nutritional supplement  gerd Stable, change protonix to 20 mg daily and monitor  Chronic hyponatremia Monitor BMP, continue sodium chloride 1gm bid for now  Hyperlipidemia Lipid Panel     Component Value Date/Time   CHOL 148 01/24/2016   TRIG 65 01/24/2016   HDL 58 01/24/2016   CHOLHDL 2 10/05/2014 0836   VLDL 14.4 10/05/2014 0836   LDLCALC 77 01/24/2016   LDLDIRECT 141.6 12/02/2008 1215   Continue lipitor  Hypertensive heart disease with heart failure Stable bp, Continue lopressor 50 mg bid, norvasc 10 mg daily and valsartan 320 mg daily and monitor bp and  bmp   Family/ staff Communication: reviewed care plan with patient and nursing supervisor    Blanchie Serve, MD  [place] [phone] (Monday-Friday 8 am - 5 pm) [phone] (afterhours)

## 2016-04-30 ENCOUNTER — Non-Acute Institutional Stay (SKILLED_NURSING_FACILITY): Payer: Medicare Other | Admitting: Family

## 2016-04-30 ENCOUNTER — Encounter: Payer: Self-pay | Admitting: Family

## 2016-04-30 DIAGNOSIS — R05 Cough: Secondary | ICD-10-CM

## 2016-04-30 DIAGNOSIS — L97521 Non-pressure chronic ulcer of other part of left foot limited to breakdown of skin: Secondary | ICD-10-CM

## 2016-04-30 DIAGNOSIS — R059 Cough, unspecified: Secondary | ICD-10-CM

## 2016-04-30 NOTE — Progress Notes (Signed)
Location:   [place] Room Number: 906-A Place of Service:  SNF (31) Provider:  Chrisanne Loose FNP-C  Blanchie Serve, MD  Patient Care Team: Blanchie Serve, MD as PCP - General (Internal Medicine)  Extended Emergency Contact Information Primary Emergency Contact: Jari Pigg of [place] Phone: [phone] Work Phone: [phone] Relation: Friend Secondary Emergency Contact: Elma Center of [place] Phone: [phone] Relation: Friend  Code Status: DNR Goals of care: Advanced Directive information Advanced Directives 04/30/2016  Does patient have an advance directive? No  Would patient like information on creating an advanced directive? No - patient declined information     Chief Complaint  Patient presents with  . Acute Visit    HPI:  Pt is a 82 y.o. female seen today at [facility]  for an acute visit for evaluation of cough and Left toe wound. She is seen in her room today per facility Nurse request. She has  Had cough for the past several days which seems to be getting worse. She has used PRN robitussin with some relief. Facility wound Nurse also reports patient has an open sore on left toe. Patient does not recall any injury to toe. She denies any fever or  Chills.     Past Medical History  Diagnosis Date  . CARCINOID TUMOR 04/13/2007  . HYPERTENSION 04/13/2007  . ALLERGIC RHINITIS 04/13/2007  . GERD 04/13/2007  . HYPERGLYCEMIA 04/13/2007  . CHOLELITHIASIS 05/02/2010  . Arthritis   . Cancer ([place])   . Environmental allergies     has mucus in throat  . Diphtheria     age 53  . CAD (coronary artery disease)     NSTEMI.  LHC 09/03/11: pLAD 40-50%, pDx 80-90% (very small), lateral branch of OM 99%, m-dCFX 40%, oRCA 80%, m-dRCA 50-70%, EF 55%.  She had PCI with BMS to lateral branch of the OM.  RCA was tx medically.   . Hyponatremia   . Murmur     echo 11/12: EF 55-60%,  mod calcified AV annulus, mild AI, mild MR  . Dermatophytosis of nail   . UTI (urinary tract infection) 03/2015   Past Surgical History  Procedure Laterality Date  . Tonsillectomy      many years ago  . Cardiac surgery    . Colon surgery      CANCER  . Small bowel repair    . Left heart catheterization with coronary angiogram N/A 09/03/2011    Procedure: LEFT HEART CATHETERIZATION WITH CORONARY ANGIOGRAM;  Surgeon: Peter M Martinique, MD;  Location: [facility] C[facility] LAB;  Service: Cardiovascular;  Laterality: N/A;  . Percutaneous coronary stent intervention (pci-s)  09/03/2011    Procedure: PERCUTANEOUS CORONARY STENT INTERVENTION (PCI-S);  Surgeon: Peter M Martinique, MD;  Location: [facility] CATH LAB;  Service: Cardiovascular;;    Allergies  Allergen Reactions  . Ciprofloxacin     emesis  . Paxil [Paroxetine Hcl] Nausea And Vomiting      Medication List       This list is accurate as of: 04/30/16  1:38 PM.  Always use your most recent med list.               acetaminophen 500 MG tablet  Commonly known as:  TYLENOL  Take 1,000 mg by mouth 2 (two) times daily.     amLODipine 10 MG tablet  Commonly known as:  NORVASC  Take 10 mg by mouth daily.  aspirin 81 MG tablet  Take 81 mg by mouth daily.     atorvastatin 10 MG tablet  Commonly known as:  LIPITOR  Take 10 mg by mouth daily at 6 PM.     bisacodyl 10 MG suppository  Commonly known as:  DULCOLAX  Place 1 suppository (10 mg total) rectally daily as needed for moderate constipation.     calcium-vitamin D 500-200 MG-UNIT tablet  Commonly known as:  OSCAL WITH D  Take 1 tablet by mouth 2 (two) times daily.     docusate sodium 100 MG capsule  Commonly known as:  COLACE  Take 1 capsule (100 mg total) by mouth 2 (two) times daily.     metoprolol 50 MG tablet  Commonly known as:  LOPRESSOR  Take 1 tablet (50 mg total) by mouth 2 (two) times daily.     MULTI VITAMIN DAILY Tabs  Take 1 tablet by mouth daily. Pt. Not sure of dose.       nitroGLYCERIN 0.4 MG SL tablet  Commonly known as:  NITROSTAT  Place 1 tablet (0.4 mg total) under the tongue every 5 (five) minutes as needed for chest pain.     pantoprazole 20 MG tablet  Commonly known as:  PROTONIX  Take 20 mg by mouth daily.     pregabalin 50 MG capsule  Commonly known as:  LYRICA  Take one capsule by mouth three times daily for neuropathy     senna-docusate 8.6-50 MG tablet  Commonly known as:  Senokot-S  Take 1 tablet by mouth at bedtime as needed for mild constipation.     sodium chloride 1 g tablet  Take 1 g by mouth 2 (two) times daily with a meal.     traMADol 50 MG tablet  Commonly known as:  ULTRAM  1 tab (50 mg)  by mouth for pain 2-5/10 every 6 hours , 2 tabs =  (100 mg) by mouth for pain 6-10/10 every 6 hours as needed     UNABLE TO FIND  Med Name: Med Pass take 130ml by mouth three times daily.     valsartan 320 MG tablet  Commonly known as:  DIOVAN  Take 1 tablet (320 mg total) by mouth daily.        Review of Systems  Constitutional: Negative for activity change, appetite change, chills, fatigue and fever.  HENT: Negative for congestion, rhinorrhea, sinus pressure, sneezing and sore throat.   Eyes: Negative.   Respiratory: Positive for cough. Negative for chest tightness, shortness of breath and wheezing.   Cardiovascular: Negative for chest pain, palpitations and leg swelling.  Gastrointestinal: Negative for abdominal distention, constipation, diarrhea, nausea and vomiting.       Lower abdominal pain   Genitourinary: Negative for dysuria, flank pain, frequency and urgency.  Musculoskeletal: Positive for gait problem. Negative for back pain.  Skin: Negative.   Psychiatric/Behavioral: Negative.     Immunization History  Administered Date(s) Administered  . Influenza Split 07/15/2011, 07/02/2012  . Influenza Whole 08/19/2007, 07/29/2008  . Influenza,inj,Quad PF,36+ Mos 10/12/2013  . Influenza-Unspecified 08/10/2015  . PPD  Test 07/15/2015, 07/29/2015  . Pneumococcal Conjugate-13 12/27/2014  . Pneumococcal Polysaccharide-23 08/22/2003  . Pneumococcal-Unspecified 08/27/2015  . Tdap 05/30/2011   Pertinent  Health Maintenance Due  Topic Date Due  . DEXA SCAN  12/26/2017 (Originally 12/22/1986)  . INFLUENZA VACCINE  05/21/2016  . PNA vac Low Risk Adult  Completed   Fall Risk  12/27/2014  Falls in the past year? No  Functional Status Survey:    Filed Vitals:   04/30/16 1318  BP: 125/61  Pulse: 67  Temp: 97.7 F (36.5 C)  Resp: 20  Height: 5\' 7"  (1.702 m)  Weight: 108 lb 11.2 oz (49.306 kg)  SpO2: 95%   Body mass index is 17.02 kg/(m^2). Physical Exam  Constitutional: She appears well-developed and well-nourished.  Elderly in no acute distress   HENT:  Head: Normocephalic.  Mouth/Throat: Oropharynx is clear and moist. No oropharyngeal exudate.  Eyes: Conjunctivae and EOM are normal. Pupils are equal, round, and reactive to light. Right eye exhibits no discharge. Left eye exhibits no discharge. No scleral icterus.  Neck: Normal range of motion. No JVD present. No thyromegaly present.  Cardiovascular: Normal rate, regular rhythm, normal heart sounds and intact distal pulses.  Exam reveals no gallop and no friction rub.   No murmur heard. Pulmonary/Chest: Effort normal. No respiratory distress. She has no wheezes. She has no rales.  Left upper lobe diminished clear lung sounds.   Abdominal: Soft. Bowel sounds are normal. She exhibits no distension and no mass. There is no rebound and no guarding.  Musculoskeletal: She exhibits no edema or tenderness.  Lymphadenopathy:    She has no cervical adenopathy.  Neurological: She is alert.  Skin: Skin is warm and dry. No rash noted. No erythema. No pallor.  Left 2nd toe dime size ulcer. Wound bed red with small serosanguinous drainage. Surrounding skin tissue without any redness, swelling or tenderness to touch.   Psychiatric: She has a normal mood and  affect.    Labs reviewed:  Recent Labs  05/30/15 0431  05/31/15 0342  06/01/15 0511  07/14/15 0710 07/14/15 1256 07/15/15 0559  10/26/15 12/21/15 01/24/16  NA 126*  < > 125*  < > 127*  < > 126* 128* 130*  < > 125* 128* 129*  K 4.6  < > 4.6  < > 4.8  < > 4.4 4.5 4.5  < > 4.8 4.6 4.2  CL 92*  < > 91*  < > 89*  < > 97* 100* 94*  --   --   --   --   CO2 25  < > 25  < > 27  < > 25 25 29   --   --   --   --   GLUCOSE 113*  < > 101*  < > 92  < > 100* 105* 95  --   --   --   --   BUN 13  < > 19  < > 22*  < > 9 8 7   < > 13 15 9   CREATININE 0.96  < > 1.01*  < > 0.99  < > 0.82 0.81 0.84  < > 0.6 0.7 0.7  CALCIUM 8.8*  < > 8.6*  < > 9.0  < > 8.7* 8.9 9.5  --   --   --   --   MG 1.7  --  1.8  --  1.8  --   --   --   --   --   --   --   --   < > = values in this interval not displayed.  Recent Labs  05/31/15 0342 06/01/15 0511 07/11/15 1716 07/24/15 10/26/15  AST 21 23 29 16 27   ALT 15 18 21 18 29   ALKPHOS 93 89 102 112 98  BILITOT 0.8 0.9 0.7  --   --   PROT 6.4* 6.4* 7.3  --   --  ALBUMIN 2.4* 2.4* 3.4*  --   --     Recent Labs  05/31/15 0342 06/01/15 0511 07/11/15 1716 07/12/15 0532 07/13/15 0624 07/14/15 0710  12/21/15 01/24/16 03/21/16  WBC 10.3 7.6 5.3 4.3 4.2 4.2  < > 6.1 4.6 7.2  NEUTROABS 7.4 5.3 4.2  --   --   --   --   --   --   --   HGB 16.4* 16.4* 17.5* 17.0* 15.7* 15.2*  < > 13.9 12.7 11.8*  HCT 46.7* 46.7* 49.6* 48.1* 45.5 44.5  < > 42 39 37  MCV 90.0 91.0 91.2 91.1 93.2 93.9  --   --   --   --   PLT 403* 397 426* 358 367 325  < > 536* 474* 587*  < > = values in this interval not displayed. Lab Results  Component Value Date   TSH 3.697 05/30/2015   Lab Results  Component Value Date   HGBA1C 6.0 12/02/2008   Lab Results  Component Value Date   CHOL 148 01/24/2016   HDL 58 01/24/2016   LDLCALC 77 01/24/2016   LDLDIRECT 141.6 12/02/2008   TRIG 65 01/24/2016   CHOLHDL 2 10/05/2014    Significant Diagnostic Results in last 30 days:  No results  found.  Assessment/Plan Cough  Afebrile. White phlegm. Exam findings unremarkable except for left upper lobe diminished clear breath sounds. Continue with Robitussin as needed. Will obtain portable CXR 2 views Pa/Lat rule out pneumonia given patient's immobility.   Left 2 nd toe Ulcer Afebrile. No significant injury to toe reported.Wound bed red with small serosanguinous drainage. Surrounding skin tissue without any redness, swelling or tenderness to touch. Facility wound Nurse to continue with wound management. Monitor for any signs of infections such as redness, swelling, tenderness, odor or increased drainage.    Family/ staff Communication: Reviewed plan of care with Facility Nurse, Wound Care Nurse and Nurse supervisor.   Labs/tests ordered: Portable CXR Pa/Lat

## 2016-05-07 ENCOUNTER — Non-Acute Institutional Stay (SKILLED_NURSING_FACILITY): Payer: Medicare Other | Admitting: Family

## 2016-05-07 ENCOUNTER — Encounter: Payer: Self-pay | Admitting: Family

## 2016-05-07 DIAGNOSIS — I5032 Chronic diastolic (congestive) heart failure: Secondary | ICD-10-CM

## 2016-05-07 DIAGNOSIS — K219 Gastro-esophageal reflux disease without esophagitis: Secondary | ICD-10-CM

## 2016-05-07 DIAGNOSIS — L03032 Cellulitis of left toe: Secondary | ICD-10-CM | POA: Diagnosis not present

## 2016-05-07 DIAGNOSIS — E785 Hyperlipidemia, unspecified: Secondary | ICD-10-CM | POA: Diagnosis not present

## 2016-05-07 DIAGNOSIS — E46 Unspecified protein-calorie malnutrition: Secondary | ICD-10-CM

## 2016-05-07 DIAGNOSIS — I110 Hypertensive heart disease with heart failure: Secondary | ICD-10-CM | POA: Diagnosis not present

## 2016-05-07 NOTE — Progress Notes (Signed)
Location:   [facility] Room Number: 906-A Place of Service:  SNF (31) Provider:  Marlowe Sax FNP-C   Patient Care Team: Blanchie Serve, MD as PCP - General (Internal Medicine)  Extended Emergency Contact Information Primary Emergency Contact: Jari Pigg of [place] Phone: [phone] Work Phone: [phone] Relation: Friend Secondary Emergency Contact: Tina of [place] Phone: [phone] Relation: Friend  Code Status:  DNR Goals of care: Advanced Directive information Advanced Directives 05/07/2016  Does patient have an advance directive? Yes  Type of Advance Directive Out of facility DNR (pink MOST or yellow form)  Does patient want to make changes to advanced directive? No - Patient declined  Would patient like information on creating an advanced directive? -     Chief Complaint  Patient presents with  . Medical Management of Chronic Issues    HPI:  Pt is a 82 y.o. female seen today at [facility] for medical management of chronic diseases.She is seen in her room today. She states not feeling well this visit due to left great toe pain.She also complains of cough  She denies any fever but states stays cold all the time. She has had no recent fall episodes or hospital admission since last visit. She continues to require total care assistance with ADL's. Facility staff reports no new concerns.      Past Medical History  Diagnosis Date  . CARCINOID TUMOR 04/13/2007  . HYPERTENSION 04/13/2007  . ALLERGIC RHINITIS 04/13/2007  . GERD 04/13/2007  . HYPERGLYCEMIA 04/13/2007  . CHOLELITHIASIS 05/02/2010  . Arthritis   . Cancer ([place])   . Environmental allergies     has mucus in throat  . Diphtheria     age 70  . CAD (coronary artery disease)     NSTEMI.  LHC 09/03/11: pLAD 40-50%, pDx 80-90% (very small), lateral branch of OM 99%, m-dCFX 40%, oRCA 80%, m-dRCA 50-70%, EF  55%.  She had PCI with BMS to lateral branch of the OM.  RCA was tx medically.   . Hyponatremia   . Murmur     echo 11/12: EF 55-60%, mod calcified AV annulus, mild AI, mild MR  . Dermatophytosis of nail   . UTI (urinary tract infection) 03/2015   Past Surgical History  Procedure Laterality Date  . Tonsillectomy      many years ago  . Cardiac surgery    . Colon surgery      CANCER  . Small bowel repair    . Left heart catheterization with coronary angiogram N/A 09/03/2011    Procedure: LEFT HEART CATHETERIZATION WITH CORONARY ANGIOGRAM;  Surgeon: Peter M Martinique, MD;  Location: [facility] CATH LAB;  Service: Cardiovascular;  Laterality: N/A;  . Percutaneous coronary stent intervention (pci-s)  09/03/2011    Procedure: PERCUTANEOUS CORONARY STENT INTERVENTION (PCI-S);  Surgeon: Peter M Martinique, MD;  Location: [facility] CATH LAB;  Service: Cardiovascular;;    Allergies  Allergen Reactions  . Ciprofloxacin     emesis  . Paxil [Paroxetine Hcl] Nausea And Vomiting      Medication List       This list is accurate as of: 05/07/16  3:34 PM.  Always use your most recent med list.               acetaminophen 500 MG tablet  Commonly known as:  TYLENOL  Take 1,000 mg by mouth 2 (two) times daily.  amLODipine 10 MG tablet  Commonly known as:  NORVASC  Take 10 mg by mouth daily. For HTN     aspirin 81 MG tablet  Take 81 mg by mouth daily.     atorvastatin 10 MG tablet  Commonly known as:  LIPITOR  Take 10 mg by mouth daily at 6 PM.     bisacodyl 10 MG suppository  Commonly known as:  DULCOLAX  Place 1 suppository (10 mg total) rectally daily as needed for moderate constipation.     calcium-vitamin D 500-200 MG-UNIT tablet  Commonly known as:  OSCAL WITH D  Take 1 tablet by mouth 2 (two) times daily.     docusate sodium 100 MG capsule  Commonly known as:  COLACE  Take 1 capsule (100 mg total) by mouth 2 (two) times daily.     metoprolol 50 MG tablet  Commonly known as:  LOPRESSOR    Take 1 tablet (50 mg total) by mouth 2 (two) times daily.     MULTI VITAMIN DAILY Tabs  Take 1 tablet by mouth daily. Pt. Not sure of dose.     nitroGLYCERIN 0.4 MG SL tablet  Commonly known as:  NITROSTAT  Place 1 tablet (0.4 mg total) under the tongue every 5 (five) minutes as needed for chest pain.     pantoprazole 20 MG tablet  Commonly known as:  PROTONIX  Take 20 mg by mouth daily. For GERD     pregabalin 50 MG capsule  Commonly known as:  LYRICA  Take one capsule by mouth three times daily for neuropathy     senna-docusate 8.6-50 MG tablet  Commonly known as:  Senokot-S  Take 1 tablet by mouth at bedtime as needed for mild constipation.     sodium chloride 1 g tablet  Take 1 g by mouth 2 (two) times daily with a meal.     traMADol 50 MG tablet  Commonly known as:  ULTRAM  1 tab (50 mg)  by mouth for pain 2-5/10 every 6 hours , 2 tabs =  (100 mg) by mouth for pain 6-10/10 every 6 hours as needed     UNABLE TO FIND  Med Name: Med Pass take 118ml by mouth three times daily.     valsartan 320 MG tablet  Commonly known as:  DIOVAN  Take 1 tablet (320 mg total) by mouth daily.        Review of Systems  Constitutional: Negative for fever, chills, activity change, appetite change and fatigue.  HENT: Negative for congestion, rhinorrhea, sinus pressure, sneezing and sore throat.   Eyes: Negative.   Respiratory: Negative for cough, chest tightness, shortness of breath and wheezing.   Cardiovascular: Negative for chest pain, palpitations and leg swelling.  Gastrointestinal: Negative for nausea, vomiting, abdominal pain, diarrhea, constipation and abdominal distention.  Genitourinary: Negative for dysuria, urgency, frequency and flank pain.  Musculoskeletal: Positive for gait problem. Negative for back pain.       Left great toe pain   Skin: Negative for color change, pallor and rash.       Left great toe open sore   Neurological: Negative for dizziness, weakness,  light-headedness and headaches.  Hematological: Does not bruise/bleed easily.  Psychiatric/Behavioral: Negative.     Immunization History  Administered Date(s) Administered  . Influenza Split 07/15/2011, 07/02/2012  . Influenza Whole 08/19/2007, 07/29/2008  . Influenza,inj,Quad PF,36+ Mos 10/12/2013  . Influenza-Unspecified 08/10/2015  . PPD Test 07/15/2015, 07/29/2015  . Pneumococcal Conjugate-13 12/27/2014  .  Pneumococcal Polysaccharide-23 08/22/2003  . Pneumococcal-Unspecified 08/27/2015  . Tdap 05/30/2011   Pertinent  Health Maintenance Due  Topic Date Due  . DEXA SCAN  12/26/2017 (Originally 12/22/1986)  . INFLUENZA VACCINE  05/21/2016  . PNA vac Low Risk Adult  Completed   Fall Risk  12/27/2014  Falls in the past year? No   Functional Status Survey:    Filed Vitals:   05/07/16 1523  BP: 134/62  Pulse: 73  Temp: 96 F (35.6 C)  Resp: 18  Height: 5\' 7"  (1.702 m)  Weight: 108 lb 11.2 oz (49.306 kg)  SpO2: 93%   Body mass index is 17.02 kg/(m^2). Physical Exam  Constitutional: She appears well-developed and well-nourished.  Elderly in pain.   HENT:  Head: Normocephalic.  Mouth/Throat: Oropharynx is clear and moist. No oropharyngeal exudate.  Eyes: Conjunctivae and EOM are normal. Pupils are equal, round, and reactive to light. Right eye exhibits no discharge. Left eye exhibits no discharge. No scleral icterus.  Neck: Normal range of motion. No JVD present. No thyromegaly present.  Cardiovascular: Normal rate, regular rhythm, normal heart sounds and intact distal pulses.  Exam reveals no gallop and no friction rub.   No murmur heard. Pulmonary/Chest: Effort normal and breath sounds normal. No respiratory distress. She has no wheezes. She has no rales.  Abdominal: Soft. Bowel sounds are normal. She exhibits no distension and no mass. There is no rebound and no guarding.  Genitourinary:  Incontinent   Musculoskeletal: Normal range of motion. She exhibits no edema or  tenderness.  Unsteady gait   Lymphadenopathy:    She has no cervical adenopathy.  Neurological: She is alert.  Skin: Skin is warm and dry. No rash noted. No erythema. No pallor.  Left great toe wound red with yellow drainage surrounding skin tissue redness and very tender to slight touch.   Psychiatric: She has a normal mood and affect.    Labs reviewed:  Recent Labs  05/30/15 0431  05/31/15 0342  06/01/15 0511  07/14/15 0710 07/14/15 1256 07/15/15 0559  10/26/15 12/21/15 01/24/16  NA 126*  < > 125*  < > 127*  < > 126* 128* 130*  < > 125* 128* 129*  K 4.6  < > 4.6  < > 4.8  < > 4.4 4.5 4.5  < > 4.8 4.6 4.2  CL 92*  < > 91*  < > 89*  < > 97* 100* 94*  --   --   --   --   CO2 25  < > 25  < > 27  < > 25 25 29   --   --   --   --   GLUCOSE 113*  < > 101*  < > 92  < > 100* 105* 95  --   --   --   --   BUN 13  < > 19  < > 22*  < > 9 8 7   < > 13 15 9   CREATININE 0.96  < > 1.01*  < > 0.99  < > 0.82 0.81 0.84  < > 0.6 0.7 0.7  CALCIUM 8.8*  < > 8.6*  < > 9.0  < > 8.7* 8.9 9.5  --   --   --   --   MG 1.7  --  1.8  --  1.8  --   --   --   --   --   --   --   --   < > =  values in this interval not displayed.  Recent Labs  05/31/15 0342 06/01/15 0511 07/11/15 1716 07/24/15 10/26/15  AST 21 23 29 16 27   ALT 15 18 21 18 29   ALKPHOS 93 89 102 112 98  BILITOT 0.8 0.9 0.7  --   --   PROT 6.4* 6.4* 7.3  --   --   ALBUMIN 2.4* 2.4* 3.4*  --   --     Recent Labs  05/31/15 0342 06/01/15 0511 07/11/15 1716 07/12/15 0532 07/13/15 0624 07/14/15 0710  12/21/15 01/24/16 03/21/16  WBC 10.3 7.6 5.3 4.3 4.2 4.2  < > 6.1 4.6 7.2  NEUTROABS 7.4 5.3 4.2  --   --   --   --   --   --   --   HGB 16.4* 16.4* 17.5* 17.0* 15.7* 15.2*  < > 13.9 12.7 11.8*  HCT 46.7* 46.7* 49.6* 48.1* 45.5 44.5  < > 42 39 37  MCV 90.0 91.0 91.2 91.1 93.2 93.9  --   --   --   --   PLT 403* 397 426* 358 367 325  < > 536* 474* 587*  < > = values in this interval not displayed. Lab Results  Component Value Date   TSH  3.697 05/30/2015   Lab Results  Component Value Date   HGBA1C 6.0 12/02/2008   Lab Results  Component Value Date   CHOL 148 01/24/2016   HDL 58 01/24/2016   LDLCALC 77 01/24/2016   LDLDIRECT 141.6 12/02/2008   TRIG 65 01/24/2016   CHOLHDL 2 10/05/2014    Significant Diagnostic Results in last 30 days:  No results found.  Assessment/Plan Cellulitis  Left great toe wound red with yellow drainage surrounding skin tissue redness and very tender to slight touch. Continue wound care. Ordering Doxycycline 100 mg tablet twice daily X 7 days. Florastor 250 mg tablet twice daily x 10 days for antibiotics associated diarrhea prevention. Tylenol 325 mg Tablet take 2 tabs every 8 hours for pain X 5 days then d/C. Continue to monitor.   Cough Afebrile. Exam finding negative. Recent CXR results negative. Continue robitussin PRN.   HTN B/p Stable. Continue on Amlodipine 10 mg tablet, Metoprolol 50 mg tablet and Diovan 320 mg tablet. Monitor BMP  CHF Reports productive cough this visit. Recent CXR negative findings. Exam findings negative for edema, weight gain, wheezing, rales or shortness of breath. Continue Metoprolol. Monitor weight.   GERD Stable. Continue on Protonix 20 mg tablet.   Hyperlipidemia  Continue on atorvastatin 10 mg Tablet.   Protein Malnutrition  Continue on Protein supplements. Monitor BMP   Family/ staff Communication: Reviewed plan of care with patient and facility Nurse supervisor.   Labs/tests ordered:  None

## 2016-06-06 ENCOUNTER — Encounter: Payer: Self-pay | Admitting: Family

## 2016-06-06 ENCOUNTER — Non-Acute Institutional Stay (SKILLED_NURSING_FACILITY): Payer: Medicare Other | Admitting: Family

## 2016-06-06 DIAGNOSIS — I110 Hypertensive heart disease with heart failure: Secondary | ICD-10-CM

## 2016-06-06 DIAGNOSIS — I5032 Chronic diastolic (congestive) heart failure: Secondary | ICD-10-CM | POA: Diagnosis not present

## 2016-06-06 DIAGNOSIS — E785 Hyperlipidemia, unspecified: Secondary | ICD-10-CM

## 2016-06-06 DIAGNOSIS — K219 Gastro-esophageal reflux disease without esophagitis: Secondary | ICD-10-CM

## 2016-06-06 DIAGNOSIS — E46 Unspecified protein-calorie malnutrition: Secondary | ICD-10-CM

## 2016-06-06 LAB — LIPID PANEL
Cholesterol: 149 mg/dL (ref 0–200)
HDL: 54 mg/dL (ref 35–70)
LDL Cholesterol: 83 mg/dL
Triglycerides: 60 mg/dL (ref 40–160)

## 2016-06-06 LAB — BASIC METABOLIC PANEL
BUN: 23 mg/dL — AB (ref 4–21)
CREATININE: 0.9 mg/dL (ref 0.5–1.1)
Glucose: 82 mg/dL
POTASSIUM: 5 mmol/L (ref 3.4–5.3)
Sodium: 136 mmol/L — AB (ref 137–147)

## 2016-06-06 NOTE — Progress Notes (Addendum)
Patient ID: Becky Morales, female   DOB: Sep 19, 1920, 82 y.o.   MRN: [id number]  Location:   [facility] Room Number: 906 A Place of Service:  SNF (31) Provider:  Besan Ketchem FNP-C   Blanchie Serve, MD  Patient Care Team: Blanchie Serve, MD as PCP - General (Internal Medicine)  Extended Emergency Contact Information Primary Emergency Contact: Jari Pigg of [place] Phone: [phone] Work Phone: [phone] Relation: Friend Secondary Emergency Contact: [place] of [place] Phone: [phone] Relation: Friend  Code Status: DNR  Goals of care: Advanced Directive information Advanced Directives 06/06/2016  Does patient have an advance directive? Yes  Type of Advance Directive Out of facility DNR (pink MOST or yellow form)  Does patient want to make changes to advanced directive? No - Patient declined  Copy of advanced directive(s) in chart? Yes  Would patient like information on creating an advanced directive? -  Pre-existing out of facility DNR order (yellow form or pink MOST form) -     Chief Complaint  Patient presents with  . Medical Management of Chronic Issues    Routine Visit    HPI:  Pt is a 82 y.o. female seen today at [facility] for medical management of chronic diseases. She is seen in her room today. She denies any acute issues this visit. Her left great toe previous ulcer healed. She continues to require total care assistance and spends most time in bed. Facility Nurse reports no new concerns. She has had no recent fall episodes or recent hospital admission.     Past Medical History:  Diagnosis Date  . ALLERGIC RHINITIS 04/13/2007  . Arthritis   . CAD (coronary artery disease)    NSTEMI.  LHC 09/03/11: pLAD 40-50%, pDx 80-90% (very small), lateral branch of OM 99%, m-dCFX 40%, oRCA 80%, m-dRCA 50-70%, EF 55%.  She had PCI with BMS to lateral branch of the OM.   RCA was tx medically.   . Cancer ([place])   . CARCINOID TUMOR 04/13/2007  . CHOLELITHIASIS 05/02/2010  . Dermatophytosis of nail   . Diphtheria    age 89  . Environmental allergies    has mucus in throat  . GERD 04/13/2007  . HYPERGLYCEMIA 04/13/2007  . HYPERTENSION 04/13/2007  . Hyponatremia   . Murmur    echo 11/12: EF 55-60%, mod calcified AV annulus, mild AI, mild MR  . UTI (urinary tract infection) 03/2015   Past Surgical History:  Procedure Laterality Date  . CARDIAC SURGERY    . COLON SURGERY     CANCER  . LEFT HEART CATHETERIZATION WITH CORONARY ANGIOGRAM N/A 09/03/2011   Procedure: LEFT HEART CATHETERIZATION WITH CORONARY ANGIOGRAM;  Surgeon: Peter M Martinique, MD;  Location: [facility] CATH LAB;  Service: Cardiovascular;  Laterality: N/A;  . PERCUTANEOUS CORONARY STENT INTERVENTION (PCI-S)  09/03/2011   Procedure: PERCUTANEOUS CORONARY STENT INTERVENTION (PCI-S);  Surgeon: Peter M Martinique, MD;  Location: [facility] CATH LAB;  Service: Cardiovascular;;  . SMALL BOWEL REPAIR    . TONSILLECTOMY     many years ago    Allergies  Allergen Reactions  . Ciprofloxacin     emesis  . Paxil [Paroxetine Hcl] Nausea And Vomiting      Medication List       Accurate as of 06/06/16 12:23 PM. Always use your most recent med list.          acetaminophen 500 MG tablet Commonly known  as:  TYLENOL Take 1,000 mg by mouth 2 (two) times daily.   amLODipine 10 MG tablet Commonly known as:  NORVASC Take 10 mg by mouth daily. For HTN   aspirin 81 MG tablet Take 81 mg by mouth daily.   atorvastatin 10 MG tablet Commonly known as:  LIPITOR Take 10 mg by mouth daily at 6 PM.   bisacodyl 10 MG suppository Commonly known as:  DULCOLAX Place 1 suppository (10 mg total) rectally daily as needed for moderate constipation.   calcium-vitamin D 500-200 MG-UNIT tablet Commonly known as:  OSCAL WITH D Take 1 tablet by mouth 2 (two) times daily.   docusate sodium 100 MG capsule Commonly known as:   COLACE Take 1 capsule (100 mg total) by mouth 2 (two) times daily.   metoprolol 50 MG tablet Commonly known as:  LOPRESSOR Take 1 tablet (50 mg total) by mouth 2 (two) times daily.   MULTI VITAMIN DAILY Tabs Take 1 tablet by mouth daily. Pt. Not sure of dose.   nitroGLYCERIN 0.4 MG SL tablet Commonly known as:  NITROSTAT Place 1 tablet (0.4 mg total) under the tongue every 5 (five) minutes as needed for chest pain.   pantoprazole 20 MG tablet Commonly known as:  PROTONIX Take 20 mg by mouth daily. For GERD   pregabalin 50 MG capsule Commonly known as:  LYRICA Take one capsule by mouth three times daily for neuropathy   senna-docusate 8.6-50 MG tablet Commonly known as:  Senokot-S Take 1 tablet by mouth at bedtime as needed for mild constipation.   sodium chloride 1 g tablet Take 1 g by mouth 2 (two) times daily with a meal.   traMADol 50 MG tablet Commonly known as:  ULTRAM 1 tab (50 mg)  by mouth for pain 2-5/10 every 6 hours , 2 tabs =  (100 mg) by mouth for pain 6-10/10 every 6 hours as needed   UNABLE TO FIND Med Name: Med Pass take 18ml by mouth three times daily.   valsartan 320 MG tablet Commonly known as:  DIOVAN Take 1 tablet (320 mg total) by mouth daily.       Review of Systems  Constitutional: Negative for activity change, appetite change, chills, fatigue and fever.  HENT: Negative for congestion, rhinorrhea, sinus pressure, sneezing and sore throat.   Eyes: Negative.   Respiratory: Negative for cough, chest tightness, shortness of breath and wheezing.   Cardiovascular: Negative for chest pain, palpitations and leg swelling.  Gastrointestinal: Negative for abdominal distention, abdominal pain, constipation, diarrhea, nausea and vomiting.  Endocrine: Negative.   Genitourinary: Negative for dysuria, flank pain, frequency and urgency.  Musculoskeletal: Positive for gait problem. Negative for back pain.          Skin: Negative for color change, pallor  and rash.  Neurological: Negative for dizziness, weakness, light-headedness and headaches.  Hematological: Does not bruise/bleed easily.  Psychiatric/Behavioral: Negative.     Immunization History  Administered Date(s) Administered  . Influenza Split 07/15/2011, 07/02/2012  . Influenza Whole 08/19/2007, 07/29/2008  . Influenza,inj,Quad PF,36+ Mos 10/12/2013  . Influenza-Unspecified 08/10/2015  . PPD Test 07/15/2015, 07/29/2015  . Pneumococcal Conjugate-13 12/27/2014  . Pneumococcal Polysaccharide-23 08/22/2003  . Pneumococcal-Unspecified 08/27/2015  . Tdap 05/30/2011   Pertinent  Health Maintenance Due  Topic Date Due  . INFLUENZA VACCINE  05/21/2016  . DEXA SCAN  12/26/2017 (Originally 12/22/1986)  . PNA vac Low Risk Adult  Completed   Fall Risk  12/27/2014  Falls in the past year?  No   Functional Status Survey:    Vitals:   06/06/16 1216  BP: 121/87  Pulse: 70  Resp: 20  Temp: 97.8 F (36.6 C)  TempSrc: Oral  SpO2: 99%  Weight: 109 lb 9.6 oz (49.7 kg)  Height: 5\' 7"  (1.702 m)   Body mass index is 17.17 kg/m. Physical Exam  Constitutional: She appears well-developed and well-nourished.  Elderly in pain.   HENT:  Head: Normocephalic.  Mouth/Throat: Oropharynx is clear and moist. No oropharyngeal exudate.  Eyes: Conjunctivae and EOM are normal. Pupils are equal, round, and reactive to light. Right eye exhibits no discharge. Left eye exhibits no discharge. No scleral icterus.  Neck: Normal range of motion. No JVD present. No thyromegaly present.  Cardiovascular: Normal rate, regular rhythm, normal heart sounds and intact distal pulses.  Exam reveals no gallop and no friction rub.   No murmur heard. Pulmonary/Chest: Effort normal and breath sounds normal. No respiratory distress. She has no wheezes. She has no rales.  Abdominal: Soft. Bowel sounds are normal. She exhibits no distension and no mass. There is no rebound and no guarding.  Genitourinary:  Genitourinary  Comments: Incontinent for B/B   Musculoskeletal: Normal range of motion. She exhibits no edema or tenderness.  Unsteady gait   Lymphadenopathy:    She has no cervical adenopathy.  Neurological: She is alert.  Skin: Skin is warm and dry. No rash noted. No erythema. No pallor.  Left great toe previous ulcer site healed.   Psychiatric: She has a normal mood and affect.    Labs reviewed:  Recent Labs  07/14/15 0710 07/14/15 1256 07/15/15 0559  10/26/15 12/21/15 01/24/16  NA 126* 128* 130*  < > 125* 128* 129*  K 4.4 4.5 4.5  < > 4.8 4.6 4.2  CL 97* 100* 94*  --   --   --   --   CO2 25 25 29   --   --   --   --   GLUCOSE 100* 105* 95  --   --   --   --   BUN 9 8 7   < > 13 15 9   CREATININE 0.82 0.81 0.84  < > 0.6 0.7 0.7  CALCIUM 8.7* 8.9 9.5  --   --   --   --   < > = values in this interval not displayed.  Recent Labs  07/11/15 1716 07/24/15 10/26/15  AST 29 16 27   ALT 21 18 29   ALKPHOS 102 112 98  BILITOT 0.7  --   --   PROT 7.3  --   --   ALBUMIN 3.4*  --   --     Recent Labs  07/11/15 1716 07/12/15 0532 07/13/15 0624 07/14/15 0710  12/21/15 01/24/16 03/21/16  WBC 5.3 4.3 4.2 4.2  < > 6.1 4.6 7.2  NEUTROABS 4.2  --   --   --   --   --   --   --   HGB 17.5* 17.0* 15.7* 15.2*  < > 13.9 12.7 11.8*  HCT 49.6* 48.1* 45.5 44.5  < > 42 39 37  MCV 91.2 91.1 93.2 93.9  --   --   --   --   PLT 426* 358 367 325  < > 536* 474* 587*  < > = values in this interval not displayed. Lab Results  Component Value Date   TSH 3.697 05/30/2015   Lab Results  Component Value Date   HGBA1C 6.0 12/02/2008  Lab Results  Component Value Date   CHOL 148 01/24/2016   HDL 58 01/24/2016   LDLCALC 77 01/24/2016   LDLDIRECT 141.6 12/02/2008   TRIG 65 01/24/2016   CHOLHDL 2 10/05/2014   Assessment/Plan HTN  B/p stable. Continue current meds. BMP 06/10/2016   CHF  Stable. No wheezing, rales, or shortness of breath   GERD Continue on Protonix   Hyperlipidemia  Continue on  Atorvastatin 10 mg tablet. Lipid panel 06/10/2016 Plan to discontinue if Lipid panel normal due to advance age.   Protein malnutrition  BMP 06/10/2016  Family/ staff Communication: Reviewed plan of care with patient and facility Nurse supervisor.   Labs/tests ordered:  CBC/diff, BMP, Lipid panel 06/10/2016

## 2016-07-08 ENCOUNTER — Encounter: Payer: Self-pay | Admitting: Family

## 2016-07-08 ENCOUNTER — Non-Acute Institutional Stay (SKILLED_NURSING_FACILITY): Payer: Medicare Other | Admitting: Family

## 2016-07-08 DIAGNOSIS — K5901 Slow transit constipation: Secondary | ICD-10-CM | POA: Diagnosis not present

## 2016-07-08 DIAGNOSIS — K219 Gastro-esophageal reflux disease without esophagitis: Secondary | ICD-10-CM | POA: Diagnosis not present

## 2016-07-08 DIAGNOSIS — I110 Hypertensive heart disease with heart failure: Secondary | ICD-10-CM

## 2016-07-08 DIAGNOSIS — R1312 Dysphagia, oropharyngeal phase: Secondary | ICD-10-CM

## 2016-07-08 DIAGNOSIS — I5032 Chronic diastolic (congestive) heart failure: Secondary | ICD-10-CM | POA: Diagnosis not present

## 2016-07-08 NOTE — Progress Notes (Signed)
Patient ID: Becky Morales, female   DOB: 1920-08-17, 82 y.o.   MRN: [id number]   Location:  [place] Room Number: 906-A Place of Service:  SNF (31) Provider:  Marlowe Sax, NP  Blanchie Serve, MD  Patient Care Team: Blanchie Serve, MD as PCP - General (Internal Medicine)  Extended Emergency Contact Information Primary Emergency Contact: Jari Pigg of [place] Phone: [phone] Work Phone: [phone] Relation: Friend Secondary Emergency Contact: [place] of [place] Phone: [phone] Relation: Friend  Code Status:  DNR Goals of care: Advanced Directive information Advanced Directives 07/08/2016  Does patient have an advance directive? Yes  Type of Advance Directive Out of facility DNR (pink MOST or yellow form)  Does patient want to make changes to advanced directive? No - Patient declined  Copy of advanced directive(s) in chart? Yes  Would patient like information on creating an advanced directive? -  Pre-existing out of facility DNR order (yellow form or pink MOST form) -     Chief Complaint  Patient presents with  . Medical Management of Chronic Issues    Follow up    HPI:  Pt is a 82 y.o. female seen today at [facility] for medical management of chronic diseases. She has a medical history of HTN, CHF, CAD, GERD, Hyperlipidemia, Anxiety among other conditions. She is seen in her room today. She states has had problems swallowing her pills had one tylenol stuck in her throat couldn't swallow nor get up but finaly went down.Skillied ST was 07/05/2016 consulted.She states will take medication crushed and put in apple sauce. She has had no other issues. She denies any acute issues this visit. No recent fall episodes, skin breakdown or recent hospital admission.Her weight has been stable for the past two months.     Past Medical History:  Diagnosis Date  . ALLERGIC  RHINITIS 04/13/2007  . Arthritis   . CAD (coronary artery disease)    NSTEMI.  LHC 09/03/11: pLAD 40-50%, pDx 80-90% (very small), lateral branch of OM 99%, m-dCFX 40%, oRCA 80%, m-dRCA 50-70%, EF 55%.  She had PCI with BMS to lateral branch of the OM.  RCA was tx medically.   . Cancer ([place])   . CARCINOID TUMOR 04/13/2007  . CHOLELITHIASIS 05/02/2010  . Dermatophytosis of nail   . Diphtheria    age 76  . Environmental allergies    has mucus in throat  . GERD 04/13/2007  . HYPERGLYCEMIA 04/13/2007  . HYPERTENSION 04/13/2007  . Hyponatremia   . Murmur    echo 11/12: EF 55-60%, mod calcified AV annulus, mild AI, mild MR  . UTI (urinary tract infection) 03/2015   Past Surgical History:  Procedure Laterality Date  . CARDIAC SURGERY    . COLON SURGERY     CANCER  . LEFT HEART CATHETERIZATION WITH CORONARY ANGIOGRAM N/A 09/03/2011   Procedure: LEFT HEART CATHETERIZATION WITH CORONARY ANGIOGRAM;  Surgeon: Peter M Martinique, MD;  Location: [facility] CATH LAB;  Service: Cardiovascular;  Laterality: N/A;  . PERCUTANEOUS CORONARY STENT INTERVENTION (PCI-S)  09/03/2011   Procedure: PERCUTANEOUS CORONARY STENT INTERVENTION (PCI-S);  Surgeon: Peter M Martinique, MD;  Location: [facility] CATH LAB;  Service: Cardiovascular;;  . SMALL BOWEL REPAIR    . TONSILLECTOMY     many years ago    Allergies  Allergen Reactions  . Ciprofloxacin     emesis  . Paxil [Paroxetine Hcl] Nausea And Vomiting  Medication List       Accurate as of 07/08/16  6:15 PM. Always use your most recent med list.          acetaminophen 500 MG tablet Commonly known as:  TYLENOL Take 1,000 mg by mouth 2 (two) times daily.   amLODipine 10 MG tablet Commonly known as:  NORVASC Take 10 mg by mouth daily. For HTN   aspirin 81 MG tablet Take 81 mg by mouth daily.   atorvastatin 10 MG tablet Commonly known as:  LIPITOR Take 10 mg by mouth daily at 6 PM.   bisacodyl 10 MG suppository Commonly known as:  DULCOLAX Place 1 suppository  (10 mg total) rectally daily as needed for moderate constipation.   calcium-vitamin D 500-200 MG-UNIT tablet Commonly known as:  OSCAL WITH D Take 1 tablet by mouth 2 (two) times daily.   docusate sodium 100 MG capsule Commonly known as:  COLACE Take 1 capsule (100 mg total) by mouth 2 (two) times daily.   metoprolol 50 MG tablet Commonly known as:  LOPRESSOR Take 1 tablet (50 mg total) by mouth 2 (two) times daily.   MULTI VITAMIN DAILY Tabs Take 1 tablet by mouth daily. Pt. Not sure of dose.   nitroGLYCERIN 0.4 MG SL tablet Commonly known as:  NITROSTAT Place 1 tablet (0.4 mg total) under the tongue every 5 (five) minutes as needed for chest pain.   pantoprazole 20 MG tablet Commonly known as:  PROTONIX Take 20 mg by mouth daily. For GERD   pregabalin 50 MG capsule Commonly known as:  LYRICA Take one capsule by mouth three times daily for neuropathy   senna-docusate 8.6-50 MG tablet Commonly known as:  Senokot-S Take 1 tablet by mouth at bedtime as needed for mild constipation.   sodium chloride 1 g tablet Take 1 g by mouth 2 (two) times daily with a meal.   traMADol 50 MG tablet Commonly known as:  ULTRAM 1 tab (50 mg)  by mouth for pain 2-5/10 every 6 hours , 2 tabs =  (100 mg) by mouth for pain 6-10/10 every 6 hours as needed   UNABLE TO FIND Med Name: Med Pass take 136ml by mouth three times daily.   valsartan 320 MG tablet Commonly known as:  DIOVAN Take 1 tablet (320 mg total) by mouth daily.       Review of Systems  Constitutional: Negative for activity change, appetite change, chills, fatigue and fever.  HENT: Positive for trouble swallowing. Negative for congestion, rhinorrhea, sinus pressure, sneezing and sore throat.   Eyes: Negative.   Respiratory: Negative for cough, chest tightness, shortness of breath and wheezing.   Cardiovascular: Negative for chest pain, palpitations and leg swelling.  Gastrointestinal: Negative for abdominal distention,  abdominal pain, constipation, diarrhea, nausea and vomiting.  Endocrine: Negative.   Genitourinary: Negative for dysuria, flank pain, frequency and urgency.  Musculoskeletal: Positive for gait problem. Negative for back pain.       Right leg pain   Skin: Negative for color change, pallor and rash.  Neurological: Negative for dizziness, weakness, light-headedness and headaches.  Hematological: Does not bruise/bleed easily.  Psychiatric/Behavioral: Negative for agitation, confusion, hallucinations and sleep disturbance. The patient is not nervous/anxious.     Immunization History  Administered Date(s) Administered  . Influenza Split 07/15/2011, 07/02/2012  . Influenza Whole 08/19/2007, 07/29/2008  . Influenza,inj,Quad PF,36+ Mos 10/12/2013  . Influenza-Unspecified 08/10/2015  . PPD Test 07/15/2015, 07/29/2015  . Pneumococcal Conjugate-13 12/27/2014  . Pneumococcal Polysaccharide-23  08/22/2003  . Pneumococcal-Unspecified 08/27/2015  . Tdap 05/30/2011   Pertinent  Health Maintenance Due  Topic Date Due  . INFLUENZA VACCINE  05/21/2016  . DEXA SCAN  12/26/2017 (Originally 12/22/1986)  . PNA vac Low Risk Adult  Completed   Fall Risk  12/27/2014  Falls in the past year? No      Vitals:   07/08/16 1222  BP: (!) 126/53  Pulse: 71  Resp: 20  Temp: 98 F (36.7 C)  TempSrc: Oral  SpO2: 96%  Weight: 109 lb 8 oz (49.7 kg)  Height: 5\' 7"  (1.702 m)   Body mass index is 17.15 kg/m. Physical Exam  Constitutional: She appears well-developed and well-nourished.  Pleasant elderly in no acute distress.    HENT:  Head: Normocephalic.  Mouth/Throat: Oropharynx is clear and moist. No oropharyngeal exudate.  Eyes: Conjunctivae and EOM are normal. Pupils are equal, round, and reactive to light. Right eye exhibits no discharge. Left eye exhibits no discharge. No scleral icterus.  Neck: Normal range of motion. No JVD present. No thyromegaly present.  Cardiovascular: Normal rate, regular  rhythm, normal heart sounds and intact distal pulses.  Exam reveals no gallop and no friction rub.   No murmur heard. Pulmonary/Chest: Effort normal and breath sounds normal. No respiratory distress. She has no wheezes. She has no rales.  Abdominal: Soft. Bowel sounds are normal. She exhibits no distension and no mass. There is no rebound and no guarding.  Genitourinary:  Genitourinary Comments: Incontinent for both bowel and bladder.   Musculoskeletal: Normal range of motion. She exhibits no edema, tenderness or deformity.  RLE pain. Unsteady gait   Lymphadenopathy:    She has no cervical adenopathy.  Neurological: She is alert.  Skin: Skin is warm and dry. No rash noted. No erythema. No pallor.  Left great toe previous wound site healed. Protective Band Aid in place.   Psychiatric: She has a normal mood and affect.    Labs reviewed:  Recent Labs  07/14/15 0710 07/14/15 1256 07/15/15 0559  12/21/15 01/24/16 06/06/16  NA 126* 128* 130*  < > 128* 129* 136*  K 4.4 4.5 4.5  < > 4.6 4.2 5.0  CL 97* 100* 94*  --   --   --   --   CO2 25 25 29   --   --   --   --   GLUCOSE 100* 105* 95  --   --   --   --   BUN 9 8 7   < > 15 9 23*  CREATININE 0.82 0.81 0.84  < > 0.7 0.7 0.9  CALCIUM 8.7* 8.9 9.5  --   --   --   --   < > = values in this interval not displayed.  Recent Labs  07/11/15 1716 07/24/15 10/26/15  AST 29 16 27   ALT 21 18 29   ALKPHOS 102 112 98  BILITOT 0.7  --   --   PROT 7.3  --   --   ALBUMIN 3.4*  --   --     Recent Labs  07/11/15 1716 07/12/15 0532 07/13/15 0624 07/14/15 0710  12/21/15 01/24/16 03/21/16  WBC 5.3 4.3 4.2 4.2  < > 6.1 4.6 7.2  NEUTROABS 4.2  --   --   --   --   --   --   --   HGB 17.5* 17.0* 15.7* 15.2*  < > 13.9 12.7 11.8*  HCT 49.6* 48.1* 45.5 44.5  < > 42  39 37  MCV 91.2 91.1 93.2 93.9  --   --   --   --   PLT 426* 358 367 325  < > 536* 474* 587*  < > = values in this interval not displayed. Lab Results  Component Value Date   TSH 3.697  05/30/2015   Lab Results  Component Value Date   HGBA1C 6.0 12/02/2008   Lab Results  Component Value Date   CHOL 149 06/06/2016   HDL 54 06/06/2016   LDLCALC 83 06/06/2016   LDLDIRECT 141.6 12/02/2008   TRIG 60 06/06/2016   CHOLHDL 2 10/05/2014     Assessment/Plan HTN B/p stable. Continue on Metoprolol and Amlodipine. Monitor BMP  CHF Asymptomatic. No abrupt weight gain or edema.Continue on Metoprolol. Currently off diuretic.Continue to monitor weight.   Constipation  Current regimen effective. Continue to encourage oral intake and hydration.   GERD Protonix effective.   Dysphagia Has has worsening trouble swallowing meds. Continue follow up with skilled ST. May crush medication and give in apple sauce.    Family/ staff Communication: Reviewed plan of care with patient and Nurse supervisor   Labs/tests ordered:  none

## 2016-07-25 LAB — BASIC METABOLIC PANEL
BUN: 21 mg/dL (ref 4–21)
CREATININE: 0.8 mg/dL (ref 0.5–1.1)
Glucose: 100 mg/dL
POTASSIUM: 4.7 mmol/L (ref 3.4–5.3)
SODIUM: 131 mmol/L — AB (ref 137–147)

## 2016-07-25 LAB — LIPID PANEL
CHOLESTEROL: 145 mg/dL (ref 0–200)
HDL: 57 mg/dL (ref 35–70)
LDL Cholesterol: 76 mg/dL
TRIGLYCERIDES: 64 mg/dL (ref 40–160)

## 2016-07-25 LAB — CBC AND DIFFERENTIAL
HCT: 29 % — AB (ref 36–46)
HEMOGLOBIN: 8.5 g/dL — AB (ref 12.0–16.0)
Platelets: 421 10*3/uL — AB (ref 150–399)
WBC: 8.3 10*3/mL

## 2016-07-25 LAB — TSH: TSH: 4.34 u[IU]/mL (ref 0.41–5.90)

## 2016-07-26 ENCOUNTER — Other Ambulatory Visit: Payer: Self-pay

## 2016-07-26 MED ORDER — PREGABALIN 50 MG PO CAPS: ORAL_CAPSULE | ORAL | 5 refills | Status: AC

## 2016-08-01 ENCOUNTER — Encounter: Payer: Self-pay | Admitting: Internal Medicine

## 2016-08-01 ENCOUNTER — Non-Acute Institutional Stay (SKILLED_NURSING_FACILITY): Payer: Medicare Other | Admitting: Internal Medicine

## 2016-08-01 DIAGNOSIS — I110 Hypertensive heart disease with heart failure: Secondary | ICD-10-CM

## 2016-08-01 DIAGNOSIS — K219 Gastro-esophageal reflux disease without esophagitis: Secondary | ICD-10-CM

## 2016-08-01 DIAGNOSIS — E871 Hypo-osmolality and hyponatremia: Secondary | ICD-10-CM | POA: Diagnosis not present

## 2016-08-01 DIAGNOSIS — M792 Neuralgia and neuritis, unspecified: Secondary | ICD-10-CM

## 2016-08-01 DIAGNOSIS — E440 Moderate protein-calorie malnutrition: Secondary | ICD-10-CM | POA: Diagnosis not present

## 2016-08-01 DIAGNOSIS — M170 Bilateral primary osteoarthritis of knee: Secondary | ICD-10-CM | POA: Diagnosis not present

## 2016-08-01 DIAGNOSIS — I5032 Chronic diastolic (congestive) heart failure: Secondary | ICD-10-CM | POA: Diagnosis not present

## 2016-08-01 DIAGNOSIS — E784 Other hyperlipidemia: Secondary | ICD-10-CM | POA: Diagnosis not present

## 2016-08-01 DIAGNOSIS — I25708 Atherosclerosis of coronary artery bypass graft(s), unspecified, with other forms of angina pectoris: Secondary | ICD-10-CM

## 2016-08-01 DIAGNOSIS — R4189 Other symptoms and signs involving cognitive functions and awareness: Secondary | ICD-10-CM | POA: Diagnosis not present

## 2016-08-01 DIAGNOSIS — E7849 Other hyperlipidemia: Secondary | ICD-10-CM

## 2016-08-01 DIAGNOSIS — Z7409 Other reduced mobility: Secondary | ICD-10-CM | POA: Diagnosis not present

## 2016-08-01 NOTE — Progress Notes (Signed)
Patient ID: Becky Morales, female   DOB: 01-12-1920, 82 y.o.   MRN: [id number]     [facility]  PCP: Blanchie Serve, MD  Code Status: Full Code  Allergies  Allergen Reactions  . Ciprofloxacin     emesis  . Paxil [Paroxetine Hcl] Nausea And Vomiting    Chief Complaint  Patient presents with  . Medical Management of Chronic Issues    Routine Visit     HPI:  82 y.o. patient is seen for routine visit. She has been at her baseline. No new concern from nursing staff. No fall reported. She is in bed all day and refuses to get out of the bed.   Review of Systems:  Constitutional: Negative for fever, chills.  HENT: Negative for congestion, hearing loss and sore throat.   Eyes: Negative for blurred vision, double vision and discharge.  Respiratory: Negative for cough, shortness of breath and wheezing.   Cardiovascular: Negative for chest pain Gastrointestinal: Negative for heartburn, nausea, vomiting, abdominal pain. Had bowel movement yesterday.  Genitourinary: Negative for dysuria.  Musculoskeletal: Negative for falls. Positive for joint pain and muscle aches in her legs Skin: Negative for itching and rash.  Neurological: Negative for dizziness.  Psychiatric/Behavioral: Negative for depression.      Past Medical History:  Diagnosis Date  . ALLERGIC RHINITIS 04/13/2007  . Arthritis   . CAD (coronary artery disease)    NSTEMI.  LHC 09/03/11: pLAD 40-50%, pDx 80-90% (very small), lateral branch of OM 99%, m-dCFX 40%, oRCA 80%, m-dRCA 50-70%, EF 55%.  She had PCI with BMS to lateral branch of the OM.  RCA was tx medically.   . Cancer ([place])   . CARCINOID TUMOR 04/13/2007  . CHOLELITHIASIS 05/02/2010  . Dermatophytosis of nail   . Diphtheria    age 82  . Environmental allergies    has mucus in throat  . GERD 04/13/2007  . HYPERGLYCEMIA 04/13/2007  . HYPERTENSION 04/13/2007  . Hyponatremia   . Murmur    echo 11/12: EF 55-60%, mod calcified AV annulus, mild AI, mild  MR  . UTI (urinary tract infection) 03/2015   Past Surgical History:  Procedure Laterality Date  . CARDIAC SURGERY    . COLON SURGERY     CANCER  . LEFT HEART CATHETERIZATION WITH CORONARY ANGIOGRAM N/A 09/03/2011   Procedure: LEFT HEART CATHETERIZATION WITH CORONARY ANGIOGRAM;  Surgeon: Peter M Martinique, MD;  Location: [facility] CATH LAB;  Service: Cardiovascular;  Laterality: N/A;  . PERCUTANEOUS CORONARY STENT INTERVENTION (PCI-S)  09/03/2011   Procedure: PERCUTANEOUS CORONARY STENT INTERVENTION (PCI-S);  Surgeon: Peter M Martinique, MD;  Location: [facility] CATH LAB;  Service: Cardiovascular;;  . SMALL BOWEL REPAIR    . TONSILLECTOMY     many years ago   Social History:   reports that she has never smoked. She has never used smokeless tobacco. She reports that she does not drink alcohol or use drugs.  Family History  Problem Relation Age of Onset  . Hypertension Mother   . Uterine cancer Mother   . Ovarian cancer Mother   . Hypertension Father   . Stroke Father   . Heart attack Brother   . Lung cancer Sister   . Hypertension Brother   . Hyperlipidemia Brother     Medications:   Medication List       Accurate as of 08/01/16  1:15 PM. Always use your most recent med list.  acetaminophen 500 MG tablet Commonly known as:  TYLENOL Take 1,000 mg by mouth 2 (two) times daily.   amLODipine 10 MG tablet Commonly known as:  NORVASC Take 10 mg by mouth daily. For HTN   aspirin 81 MG tablet Take 81 mg by mouth daily.   atorvastatin 10 MG tablet Commonly known as:  LIPITOR Take 10 mg by mouth daily at 6 PM.   bisacodyl 10 MG suppository Commonly known as:  DULCOLAX Place 1 suppository (10 mg total) rectally daily as needed for moderate constipation.   calcium-vitamin D 500-200 MG-UNIT tablet Commonly known as:  OSCAL WITH D Take 1 tablet by mouth 2 (two) times daily.   loperamide 2 MG capsule Commonly known as:  IMODIUM Take 4 mg by mouth as needed for diarrhea or loose  stools.   metoprolol 50 MG tablet Commonly known as:  LOPRESSOR Take 1 tablet (50 mg total) by mouth 2 (two) times daily.   MULTI VITAMIN DAILY Tabs Take 1 tablet by mouth daily. Pt. Not sure of dose.   nitroGLYCERIN 0.4 MG SL tablet Commonly known as:  NITROSTAT Place 1 tablet (0.4 mg total) under the tongue every 5 (five) minutes as needed for chest pain.   omeprazole 20 MG capsule Commonly known as:  PRILOSEC Take 20 mg by mouth daily. May sprinkle over food.   pregabalin 50 MG capsule Commonly known as:  LYRICA Take one capsule by mouth three times daily for neuropathy   senna 8.6 MG tablet Commonly known as:  SENOKOT Take 1 tablet by mouth 2 (two) times daily.   sodium chloride 1 g tablet Take 1 g by mouth 2 (two) times daily with a meal.   traMADol 50 MG tablet Commonly known as:  ULTRAM 1 tab (50 mg)  by mouth for pain 2-5/10 every 6 hours , 2 tabs =  (100 mg) by mouth for pain 6-10/10 every 6 hours as needed   UNABLE TO FIND Med Name: Med Pass take 133ml by mouth three times daily.   valsartan 320 MG tablet Commonly known as:  DIOVAN Take 1 tablet (320 mg total) by mouth daily.        Physical Exam: Vitals:   08/01/16 1127  BP: (!) 131/53  Pulse: 66  Resp: 18  Temp: 97.9 F (36.6 C)  TempSrc: Oral  SpO2: 97%  Weight: 114 lb 4.8 oz (51.8 kg)  Height: 5\' 7"  (1.702 m)  Body mass index is 17.9 kg/m.   General- elderly female, frail, thin built in no acute distress Head- normocephalic, atraumatic Throat- moist mucus membrane Eyes- no pallor, no icterus, no discharge, normal conjunctiva, normal sclera, has glasses Neck- no cervical lymphadenopathy Cardiovascular- normal s1,s2, no murmur, trace leg edema Respiratory- bilateral clear to auscultation, no wheeze, no rhonchi, no crackles, no use of accessory muscles Abdomen- bowel sounds present, soft, non tender Musculoskeletal- limited knee ROM limited with her severe OA. Able to move her  extremities Neurological- alert and oriented to person and place but not to time Skin- warm and dry   Labs reviewed:  CBC Latest Ref Rng & Units 03/21/2016 01/24/2016 12/21/2015  WBC 10:3/mL 7.2 4.6 6.1  Hemoglobin 12.0 - 16.0 g/dL 11.8(A) 12.7 13.9  Hematocrit 36 - 46 % 37 39 42  Platelets 150 - 399 K/L 587(A) 474(A) 536(A)   CMP Latest Ref Rng & Units 06/06/2016 01/24/2016 12/21/2015  Glucose 65 - 99 mg/dL - - -  BUN 4 - 21 mg/dL 23(A) 9 15  Creatinine 0.5 - 1.1 mg/dL 0.9 0.7 0.7  Sodium 137 - 147 mmol/L 136(A) 129(A) 128(A)  Potassium 3.4 - 5.3 mmol/L 5.0 4.2 4.6  Chloride 101 - 111 mmol/L - - -  CO2 22 - 32 mmol/L - - -  Calcium 8.9 - 10.3 mg/dL - - -  Total Protein 6.5 - 8.1 g/dL - - -  Total Bilirubin 0.3 - 1.2 mg/dL - - -  Alkaline Phos 25 - 125 U/L - - -  AST 13 - 35 U/L - - -  ALT 7 - 35 U/L - - -   Liver Function Tests:  Recent Labs  10/26/15  AST 27  ALT 29  ALKPHOS 98     Radiological Exams: Dg Chest 2 View  07/11/2015   CLINICAL DATA:  Fall from bed.  Right hip pain.  EXAM: CHEST  2 VIEW  COMPARISON:  05/29/2015  FINDINGS: The heart size and mediastinal contours are within normal limits. Both lungs are clear. The bones appear diffusely osteopenic. There is no acute fracture or subluxation identified.  IMPRESSION: No active cardiopulmonary disease.   Electronically Signed   By: Kerby Moors M.D.   On: 07/11/2015 17:52   Ct Head Wo Contrast  07/11/2015   CLINICAL DATA:  Pt is from home, fell out of bed, right hip pain today, left sided temporal abrasion. Denies LOC, no blood thinners. No obvious deformity. Pt usually uses a walker at home, is unsteady on her feet. Pt also had a fall on Saturday  EXAM: CT HEAD WITHOUT CONTRAST  CT CERVICAL SPINE WITHOUT CONTRAST  TECHNIQUE: Multidetector CT imaging of the head and cervical spine was performed following the standard protocol without intravenous contrast. Multiplanar CT image reconstructions of the cervical spine were also  generated.  COMPARISON:  05/25/2015  FINDINGS: CT HEAD FINDINGS  Partial opacification of right mastoid air cells as before. Atherosclerotic and physiologic intracranial calcifications. Diffuse parenchymal atrophy. Prominence of lateral ventricles as before. Patchy areas of hypoattenuation in deep and periventricular white matter bilaterally. Negative for acute intracranial hemorrhage, mass lesion, acute infarction, midline shift, or mass-effect. Acute infarct may be inapparent on noncontrast CT. Ventricles and sulci symmetric. Bone windows demonstrate no focal lesion.  CT CERVICAL SPINE FINDINGS  Normal alignment. No prevertebral soft tissue swelling. Facets are seated. Mild narrowing of the C2-3 and C5-6 interspaces. Fairly exuberant degenerative change at the atlantoaxial articulation. Anterior and posterior endplate spurring [id number]. Negative for fracture. Bilateral carotid calcified plaque. Scarring in the visualized lung apices. Calcification in the apical segment left upper lobe, incompletely visualized.  IMPRESSION: 1. Negative for bleed or other acute intracranial process. 2. Atrophy and nonspecific white matter changes as before. 3. Negative for cervical fracture or other acute bone abnormality. 4. Multilevel cervical degenerative changes as above.   Electronically Signed   By: Lucrezia Europe M.D.   On: 07/11/2015 18:01   Ct Cervical Spine Wo Contrast  07/11/2015   CLINICAL DATA:  Pt is from home, fell out of bed, right hip pain today, left sided temporal abrasion. Denies LOC, no blood thinners. No obvious deformity. Pt usually uses a walker at home, is unsteady on her feet. Pt also had a fall on Saturday  EXAM: CT HEAD WITHOUT CONTRAST  CT CERVICAL SPINE WITHOUT CONTRAST  TECHNIQUE: Multidetector CT imaging of the head and cervical spine was performed following the standard protocol without intravenous contrast. Multiplanar CT image reconstructions of the cervical spine were also generated.  COMPARISON:   05/25/2015  FINDINGS: CT HEAD FINDINGS  Partial opacification of right mastoid air cells as before. Atherosclerotic and physiologic intracranial calcifications. Diffuse parenchymal atrophy. Prominence of lateral ventricles as before. Patchy areas of hypoattenuation in deep and periventricular white matter bilaterally. Negative for acute intracranial hemorrhage, mass lesion, acute infarction, midline shift, or mass-effect. Acute infarct may be inapparent on noncontrast CT. Ventricles and sulci symmetric. Bone windows demonstrate no focal lesion.  CT CERVICAL SPINE FINDINGS  Normal alignment. No prevertebral soft tissue swelling. Facets are seated. Mild narrowing of the C2-3 and C5-6 interspaces. Fairly exuberant degenerative change at the atlantoaxial articulation. Anterior and posterior endplate spurring [id number]. Negative for fracture. Bilateral carotid calcified plaque. Scarring in the visualized lung apices. Calcification in the apical segment left upper lobe, incompletely visualized.  IMPRESSION: 1. Negative for bleed or other acute intracranial process. 2. Atrophy and nonspecific white matter changes as before. 3. Negative for cervical fracture or other acute bone abnormality. 4. Multilevel cervical degenerative changes as above.   Electronically Signed   By: Lucrezia Europe M.D.   On: 07/11/2015 18:01   Dg Hip Unilat With Pelvis 2-3 Views Right  07/11/2015   CLINICAL DATA:  Right hip pain  EXAM: DG HIP (WITH OR WITHOUT PELVIS) 2-3V RIGHT  COMPARISON:  05/26/2015  FINDINGS: There is foreshortening of the right femoral neck which is suspicious for a subcapital femoral neck fracture. There is advanced osteoarthritis involving the right hip joint with near bone-on-bone contact. Marginal spur formation is noted. No dislocation.  IMPRESSION: 1. Findings are worrisome for impacted subcapital femoral neck fracture involving the right hip. Recommend confirmatory imaging with right hip CT or MRI. 2. Osteopenia. 3.  Degenerative joint disease.   Electronically Signed   By: Kerby Moors M.D.   On: 07/11/2015 17:49     Assessment/Plan  Neuropathic pain Currently on pregabalin 50 mg tid. Stable.   Cognitive impairment Likely has vascular component. Check tsh, b12 level. Reviewed ct head from 2016 showing atrophy and white matter hypoattenuation. Get BIMS to evaluate further. Continue aspirin  gerd Stable with omeprazole 20 mg daily  Limited mobility Continue air mattress and pressure ulcer prophylaxis. Encouraged to be out of bed.  CAD S/p bare metal stent in 2012. Remains chest pain free. Continue aspirin and metoprolol. Given her age, muscle aches, d/c statin. Continue prn NTG  Hypertensive heart failure Continue amlodipine 10 mg daily, valsartan 320 mg daily and lopressor 50 mg bid and monitor BP. Continue baby aspirin  Chronic diastolic chf Stable breathing, continue valsartan and lopressor.   Hyperlipidemia Lipid Panel     Component Value Date/Time   CHOL 149 06/06/2016   TRIG 60 06/06/2016   HDL 54 06/06/2016   CHOLHDL 2 10/05/2014 0836   VLDL 14.4 10/05/2014 0836   LDLCALC 83 06/06/2016   LDLDIRECT 141.6 12/02/2008 1215   LDL at goal. Given her age and with studies showing limited cardiovascular benefit, d/c statin  OA Currently on tylenol 1000 mg bid and tramadol 50 mg 1-2 tab q6h prn pain. Change tylenol to 1000 mg tid and monitor  Protein calorie malnutrition Body mass index is 17.9 kg/m. Encourage po intake, monitor weight. Continue nutritional supplement  Chronic hyponatremia Monitor BMP, continue sodium chloride 1gm bid for now    Family/ staff Communication: reviewed care plan with patient and nursing supervisor    Blanchie Serve, MD Internal Medicine [address], [place] [address] Cell Phone (Monday-Friday 8 am - 5 pm):  [phone] On Call: [phone] and follow prompts after 5 pm and on  weekends Office Phone: [phone] Office Fax: [phone]

## 2016-08-30 ENCOUNTER — Non-Acute Institutional Stay (SKILLED_NURSING_FACILITY): Payer: Medicare Other | Admitting: Family

## 2016-08-30 DIAGNOSIS — K5901 Slow transit constipation: Secondary | ICD-10-CM | POA: Diagnosis not present

## 2016-08-30 DIAGNOSIS — E782 Mixed hyperlipidemia: Secondary | ICD-10-CM

## 2016-08-30 DIAGNOSIS — M792 Neuralgia and neuritis, unspecified: Secondary | ICD-10-CM

## 2016-08-30 DIAGNOSIS — I5032 Chronic diastolic (congestive) heart failure: Secondary | ICD-10-CM

## 2016-08-30 DIAGNOSIS — M170 Bilateral primary osteoarthritis of knee: Secondary | ICD-10-CM

## 2016-08-30 NOTE — Progress Notes (Signed)
Location:  [place] Room Number: [place] of Service:  SNF (31) Provider:  Naomii Kreger FNP-C   Blanchie Serve, MD  Patient Care Team: Blanchie Serve, MD as PCP - General (Internal Medicine)  Extended Emergency Contact Information Primary Emergency Contact: Jari Pigg of [place] Phone: [phone] Work Phone: [phone] Relation: Friend Secondary Emergency Contact: [place] of [place] Phone: [phone] Relation: Friend  Code Status:  DNR  Goals of care: Advanced Directive information Advanced Directives 07/08/2016  Does patient have an advance directive? Yes  Type of Advance Directive Out of facility DNR (pink MOST or yellow form)  Does patient want to make changes to advanced directive? No - Patient declined  Copy of advanced directive(s) in chart? Yes  Would patient like information on creating an advanced directive? -  Pre-existing out of facility DNR order (yellow form or pink MOST form) -     Chief Complaint  Patient presents with  . Medical Management of Chronic Issues    HPI:  Pt is a 82 y.o. female seen today at [facility]  for medical management of chronic diseases.She has a medical history of HTN, CHF, CAD, Hyperlipidemia, Neuropathy, chronic back pain, OA among others. She is seen in her room today. She states feeling disappointed today since she did not get up for facility activities " I don't understand why". She states she enjoyed being up the previous day " I played Bingo and did the exercises and won". Discussed with patient that provider will notify facility Nurse to assist to all facility activities. Patient looking forward to next Bingo and exercises.     Past Medical History:  Diagnosis Date  . ALLERGIC RHINITIS 04/13/2007  . Arthritis   . CAD (coronary artery disease)    NSTEMI.  LHC 09/03/11: pLAD 40-50%, pDx 80-90% (very small),  lateral branch of OM 99%, m-dCFX 40%, oRCA 80%, m-dRCA 50-70%, EF 55%.  She had PCI with BMS to lateral branch of the OM.  RCA was tx medically.   . Cancer ([place])   . CARCINOID TUMOR 04/13/2007  . CHOLELITHIASIS 05/02/2010  . Dermatophytosis of nail   . Diphtheria    age 82  . Environmental allergies    has mucus in throat  . GERD 04/13/2007  . HYPERGLYCEMIA 04/13/2007  . HYPERTENSION 04/13/2007  . Hyponatremia   . Murmur    echo 11/12: EF 55-60%, mod calcified AV annulus, mild AI, mild MR  . UTI (urinary tract infection) 03/2015   Past Surgical History:  Procedure Laterality Date  . CARDIAC SURGERY    . COLON SURGERY     CANCER  . LEFT HEART CATHETERIZATION WITH CORONARY ANGIOGRAM N/A 09/03/2011   Procedure: LEFT HEART CATHETERIZATION WITH CORONARY ANGIOGRAM;  Surgeon: Peter M Martinique, MD;  Location: [facility] CATH LAB;  Service: Cardiovascular;  Laterality: N/A;  . PERCUTANEOUS CORONARY STENT INTERVENTION (PCI-S)  09/03/2011   Procedure: PERCUTANEOUS CORONARY STENT INTERVENTION (PCI-S);  Surgeon: Peter M Martinique, MD;  Location: [facility] CATH LAB;  Service: Cardiovascular;;  . SMALL BOWEL REPAIR    . TONSILLECTOMY     many years ago    Allergies  Allergen Reactions  . Ciprofloxacin     emesis  . Paxil [Paroxetine Hcl] Nausea And Vomiting      Medication List       Accurate as of 08/30/16  6:27 PM. Always use your most recent med list.  acetaminophen 500 MG tablet Commonly known as:  TYLENOL Take 1,000 mg by mouth 2 (two) times daily.   amLODipine 10 MG tablet Commonly known as:  NORVASC Take 10 mg by mouth daily. For HTN   aspirin 81 MG tablet Take 81 mg by mouth daily.   atorvastatin 10 MG tablet Commonly known as:  LIPITOR Take 10 mg by mouth daily at 6 PM.   bisacodyl 10 MG suppository Commonly known as:  DULCOLAX Place 1 suppository (10 mg total) rectally daily as needed for moderate constipation.   calcium-vitamin D 500-200 MG-UNIT tablet Commonly known as:   OSCAL WITH D Take 1 tablet by mouth 2 (two) times daily.   loperamide 2 MG capsule Commonly known as:  IMODIUM Take 4 mg by mouth as needed for diarrhea or loose stools.   metoprolol 50 MG tablet Commonly known as:  LOPRESSOR Take 1 tablet (50 mg total) by mouth 2 (two) times daily.   MULTI VITAMIN DAILY Tabs Take 1 tablet by mouth daily. Pt. Not sure of dose.   nitroGLYCERIN 0.4 MG SL tablet Commonly known as:  NITROSTAT Place 1 tablet (0.4 mg total) under the tongue every 5 (five) minutes as needed for chest pain.   omeprazole 20 MG capsule Commonly known as:  PRILOSEC Take 20 mg by mouth daily. May sprinkle over food.   pregabalin 50 MG capsule Commonly known as:  LYRICA Take one capsule by mouth three times daily for neuropathy   senna 8.6 MG tablet Commonly known as:  SENOKOT Take 1 tablet by mouth 2 (two) times daily.   sodium chloride 1 g tablet Take 1 g by mouth 2 (two) times daily with a meal.   traMADol 50 MG tablet Commonly known as:  ULTRAM 1 tab (50 mg)  by mouth for pain 2-5/10 every 6 hours , 2 tabs =  (100 mg) by mouth for pain 6-10/10 every 6 hours as needed   UNABLE TO FIND Med Name: Med Pass take 170ml by mouth three times daily.   valsartan 320 MG tablet Commonly known as:  DIOVAN Take 1 tablet (320 mg total) by mouth daily.       Review of Systems  Constitutional: Negative for activity change, appetite change, chills, fatigue and fever.  HENT: Negative for congestion, rhinorrhea, sinus pressure, sneezing and sore throat.   Eyes: Negative.   Respiratory: Negative for cough, chest tightness, shortness of breath and wheezing.   Cardiovascular: Negative for chest pain, palpitations and leg swelling.  Gastrointestinal: Negative for abdominal distention, abdominal pain, constipation, diarrhea, nausea and vomiting.  Endocrine: Negative.   Genitourinary: Negative for dysuria, flank pain, frequency and urgency.  Musculoskeletal: Positive for gait  problem. Negative for back pain.       Back/Right leg pain   Skin: Negative for color change, pallor and rash.  Neurological: Negative for dizziness, weakness, light-headedness and headaches.  Hematological: Does not bruise/bleed easily.  Psychiatric/Behavioral: Negative for agitation, confusion, hallucinations and sleep disturbance. The patient is not nervous/anxious.     Immunization History  Administered Date(s) Administered  . Influenza Split 07/15/2011, 07/02/2012  . Influenza Whole 08/19/2007, 07/29/2008  . Influenza,inj,Quad PF,36+ Mos 10/12/2013  . Influenza-Unspecified 08/10/2015  . PPD Test 07/15/2015, 07/29/2015  . Pneumococcal Conjugate-13 12/27/2014  . Pneumococcal Polysaccharide-23 08/22/2003  . Pneumococcal-Unspecified 08/27/2015  . Tdap 05/30/2011   Pertinent  Health Maintenance Due  Topic Date Due  . INFLUENZA VACCINE  05/21/2016  . DEXA SCAN  12/26/2017 (Originally 12/22/1986)  . PNA  vac Low Risk Adult  Completed   Fall Risk  12/27/2014  Falls in the past year? No      Vitals:   08/30/16 1630  BP: 136/68  Pulse: 70  Resp: 16  Temp: 97.5 F (36.4 C)  SpO2: 96%  Weight: 118 lb 1.6 oz (53.6 kg)  Height: 5\' 7"  (1.702 m)   Body mass index is 18.5 kg/m. Physical Exam  Constitutional: She appears well-developed and well-nourished.  Pleasant elderly in no acute distress.Vibrant this visit.     HENT:  Head: Normocephalic.  Mouth/Throat: Oropharynx is clear and moist. No oropharyngeal exudate.  Eyes: Conjunctivae and EOM are normal. Pupils are equal, round, and reactive to light. Right eye exhibits no discharge. Left eye exhibits no discharge. No scleral icterus.  Neck: Normal range of motion. No JVD present. No thyromegaly present.  Cardiovascular: Normal rate, regular rhythm, normal heart sounds and intact distal pulses.  Exam reveals no gallop and no friction rub.   No murmur heard. Pulmonary/Chest: Effort normal and breath sounds normal. No respiratory  distress. She has no wheezes. She has no rales.  Abdominal: Soft. Bowel sounds are normal. She exhibits no distension and no mass. There is no rebound and no guarding.  Genitourinary:  Genitourinary Comments: Incontinent for both bowel and bladder.   Musculoskeletal: Normal range of motion. She exhibits no edema, tenderness or deformity.  RLE pain. Unsteady gait   Lymphadenopathy:    She has no cervical adenopathy.  Neurological: She is alert.  Skin: Skin is warm and dry. No rash noted. No erythema. No pallor.  Left great toe previous wound site scab noted.  Psychiatric: She has a normal mood and affect.    Labs reviewed:  Recent Labs  12/21/15 01/24/16 06/06/16  NA 128* 129* 136*  K 4.6 4.2 5.0  BUN 15 9 23*  CREATININE 0.7 0.7 0.9    Recent Labs  10/26/15  AST 27  ALT 29  ALKPHOS 98    Recent Labs  12/21/15 01/24/16 03/21/16  WBC 6.1 4.6 7.2  HGB 13.9 12.7 11.8*  HCT 42 39 37  PLT 536* 474* 587*   Lab Results  Component Value Date   TSH 3.697 05/30/2015   Lab Results  Component Value Date   HGBA1C 6.0 12/02/2008   Lab Results  Component Value Date   CHOL 149 06/06/2016   HDL 54 06/06/2016   LDLCALC 83 06/06/2016   LDLDIRECT 141.6 12/02/2008   TRIG 60 06/06/2016   CHOLHDL 2 10/05/2014   Assessment/Plan 1. Chronic diastolic CHF (congestive heart failure) (HCC) Stable. No abrupt weight gain. Exam findings negative. Continue Metoprolol. Monitor weight.   2. Mixed hyperlipidemia Continue on Lipitor. Monitor lipid panel.   3. Slow transit constipation Current regimen effective.   4. Primary osteoarthritis of both knees Continue PRN pain medications.   5. Neuropathic pain Continue on Lyrica.     Family/ staff Communication: Reviewed plan of care with patient,Facility Nurse and Nurse supervisor.   Labs/tests ordered:  None

## 2016-09-09 ENCOUNTER — Non-Acute Institutional Stay (SKILLED_NURSING_FACILITY): Payer: Medicare Other | Admitting: Family

## 2016-09-09 DIAGNOSIS — M792 Neuralgia and neuritis, unspecified: Secondary | ICD-10-CM

## 2016-09-09 DIAGNOSIS — M25551 Pain in right hip: Secondary | ICD-10-CM | POA: Diagnosis not present

## 2016-09-09 DIAGNOSIS — R399 Unspecified symptoms and signs involving the genitourinary system: Secondary | ICD-10-CM

## 2016-09-09 DIAGNOSIS — K5901 Slow transit constipation: Secondary | ICD-10-CM | POA: Diagnosis not present

## 2016-09-09 DIAGNOSIS — I110 Hypertensive heart disease with heart failure: Secondary | ICD-10-CM

## 2016-09-09 DIAGNOSIS — I5032 Chronic diastolic (congestive) heart failure: Secondary | ICD-10-CM | POA: Diagnosis not present

## 2016-09-09 NOTE — Progress Notes (Signed)
Location:  [place] Room Number: [place] of Service:  SNF (31) Provider:  Diamonte Stavely FNP-C   Blanchie Serve, MD  Patient Care Team: Blanchie Serve, MD as PCP - General (Internal Medicine)  Extended Emergency Contact Information Primary Emergency Contact: Jari Pigg of [place] Phone: [phone] Work Phone: [phone] Relation: Friend Secondary Emergency Contact: [place] of [place] Phone: [phone] Relation: Friend  Code Status: DNR  Goals of care: Advanced Directive information Advanced Directives 07/08/2016  Does patient have an advance directive? Yes  Type of Advance Directive Out of facility DNR (pink MOST or yellow form)  Does patient want to make changes to advanced directive? No - Patient declined  Copy of advanced directive(s) in chart? Yes  Would patient like information on creating an advanced directive? -  Pre-existing out of facility DNR order (yellow form or pink MOST form) -     Chief Complaint  Patient presents with  . Medical Management of Chronic Issues    HPI:  Pt is a 82 y.o. female seen today at [facility] for medical management of chronic diseases. She has a medical history HTN, CHF, Neuropathy, chronic Hyponatremia among other conditions. She is seen in her room today. She states right leg pain under control with current Tylenol. She has had no recent fall episodes or recent hospital admission. She complains of burning with urination.she denies any fever or chills.   Past Medical History:  Diagnosis Date  . ALLERGIC RHINITIS 04/13/2007  . Arthritis   . CAD (coronary artery disease)    NSTEMI.  LHC 09/03/11: pLAD 40-50%, pDx 80-90% (very small), lateral branch of OM 99%, m-dCFX 40%, oRCA 80%, m-dRCA 50-70%, EF 55%.  She had PCI with BMS to lateral branch of the OM.  RCA was tx medically.   . Cancer ([place])   . CARCINOID TUMOR  04/13/2007  . CHOLELITHIASIS 05/02/2010  . Dermatophytosis of nail   . Diphtheria    age 82  . Environmental allergies    has mucus in throat  . GERD 04/13/2007  . HYPERGLYCEMIA 04/13/2007  . HYPERTENSION 04/13/2007  . Hyponatremia   . Murmur    echo 11/12: EF 55-60%, mod calcified AV annulus, mild AI, mild MR  . UTI (urinary tract infection) 03/2015   Past Surgical History:  Procedure Laterality Date  . CARDIAC SURGERY    . COLON SURGERY     CANCER  . LEFT HEART CATHETERIZATION WITH CORONARY ANGIOGRAM N/A 09/03/2011   Procedure: LEFT HEART CATHETERIZATION WITH CORONARY ANGIOGRAM;  Surgeon: Peter M Martinique, MD;  Location: [facility] CATH LAB;  Service: Cardiovascular;  Laterality: N/A;  . PERCUTANEOUS CORONARY STENT INTERVENTION (PCI-S)  09/03/2011   Procedure: PERCUTANEOUS CORONARY STENT INTERVENTION (PCI-S);  Surgeon: Peter M Martinique, MD;  Location: [facility] CATH LAB;  Service: Cardiovascular;;  . SMALL BOWEL REPAIR    . TONSILLECTOMY     many years ago    Allergies  Allergen Reactions  . Ciprofloxacin     emesis  . Paxil [Paroxetine Hcl] Nausea And Vomiting      Medication List       Accurate as of 09/09/16  4:34 PM. Always use your most recent med list.          acetaminophen 500 MG tablet Commonly known as:  TYLENOL Take 1,000 mg by mouth 2 (two) times daily.   amLODipine 10 MG tablet Commonly known as:  NORVASC  Take 10 mg by mouth daily. For HTN   aspirin 81 MG tablet Take 81 mg by mouth daily.   bisacodyl 10 MG suppository Commonly known as:  DULCOLAX Place 1 suppository (10 mg total) rectally daily as needed for moderate constipation.   calcium-vitamin D 500-200 MG-UNIT tablet Commonly known as:  OSCAL WITH D Take 1 tablet by mouth 2 (two) times daily.   loperamide 2 MG capsule Commonly known as:  IMODIUM Take 4 mg by mouth as needed for diarrhea or loose stools.   metoprolol 50 MG tablet Commonly known as:  LOPRESSOR Take 1 tablet (50 mg total) by mouth 2 (two)  times daily.   MULTI VITAMIN DAILY Tabs Take 1 tablet by mouth daily. Pt. Not sure of dose.   nitroGLYCERIN 0.4 MG SL tablet Commonly known as:  NITROSTAT Place 1 tablet (0.4 mg total) under the tongue every 5 (five) minutes as needed for chest pain.   omeprazole 20 MG capsule Commonly known as:  PRILOSEC Take 20 mg by mouth daily. May sprinkle over food.   pregabalin 50 MG capsule Commonly known as:  LYRICA Take one capsule by mouth three times daily for neuropathy   senna 8.6 MG tablet Commonly known as:  SENOKOT Take 1 tablet by mouth 2 (two) times daily.   sodium chloride 1 g tablet Take 1 g by mouth 2 (two) times daily with a meal.   traMADol 50 MG tablet Commonly known as:  ULTRAM 1 tab (50 mg)  by mouth for pain 2-5/10 every 6 hours , 2 tabs =  (100 mg) by mouth for pain 6-10/10 every 6 hours as needed   UNABLE TO FIND Med Name: Med Pass take 154ml by mouth three times daily.   valsartan 320 MG tablet Commonly known as:  DIOVAN Take 1 tablet (320 mg total) by mouth daily.       Review of Systems  Constitutional: Negative for activity change, appetite change, chills, fatigue and fever.  HENT: Negative for congestion, rhinorrhea, sinus pressure, sneezing and sore throat.   Eyes: Negative.   Respiratory: Negative for cough, chest tightness, shortness of breath and wheezing.   Cardiovascular: Negative for chest pain, palpitations and leg swelling.  Gastrointestinal: Negative for abdominal distention, abdominal pain, constipation, diarrhea, nausea and vomiting.  Endocrine: Negative.   Genitourinary: Negative for dysuria, flank pain, frequency and urgency.       Burning sensation with voiding.   Musculoskeletal: Positive for gait problem. Negative for back pain.       Right leg pain under control with current tylenol.   Skin: Negative for color change, pallor and rash.  Neurological: Negative for dizziness, weakness, light-headedness and headaches.  Hematological:  Does not bruise/bleed easily.  Psychiatric/Behavioral: Negative for agitation, confusion, hallucinations and sleep disturbance. The patient is not nervous/anxious.     Immunization History  Administered Date(s) Administered  . Influenza Split 07/15/2011, 07/02/2012  . Influenza Whole 08/19/2007, 07/29/2008  . Influenza,inj,Quad PF,36+ Mos 10/12/2013  . Influenza-Unspecified 08/10/2015  . PPD Test 07/15/2015, 07/29/2015  . Pneumococcal Conjugate-13 12/27/2014  . Pneumococcal Polysaccharide-23 08/22/2003  . Pneumococcal-Unspecified 08/27/2015  . Tdap 05/30/2011   Pertinent  Health Maintenance Due  Topic Date Due  . INFLUENZA VACCINE  05/21/2016  . DEXA SCAN  12/26/2017 (Originally 12/22/1986)  . PNA vac Low Risk Adult  Completed   Fall Risk  12/27/2014  Falls in the past year? No      Vitals:   09/09/16 1215  BP: 118/66  Pulse: 76  Resp: 18  Temp: 98.2 F (36.8 C)  SpO2: 98%  Weight: 118 lb 1.6 oz (53.6 kg)  Height: 5\' 7"  (1.702 m)   Body mass index is 18.5 kg/m. Physical Exam  Constitutional: She appears well-developed and well-nourished.  Pleasant elderly in no acute distress.  HENT:  Head: Normocephalic.  Mouth/Throat: Oropharynx is clear and moist. No oropharyngeal exudate.  Eyes: Conjunctivae and EOM are normal. Pupils are equal, round, and reactive to light. Right eye exhibits no discharge. Left eye exhibits no discharge. No scleral icterus.  Neck: Normal range of motion. No JVD present. No thyromegaly present.  Cardiovascular: Normal rate, regular rhythm, normal heart sounds and intact distal pulses.  Exam reveals no gallop and no friction rub.   No murmur heard. Pulmonary/Chest: Effort normal and breath sounds normal. No respiratory distress. She has no wheezes. She has no rales.  Abdominal: Soft. Bowel sounds are normal. She exhibits no distension and no mass. There is no rebound and no guarding.  Genitourinary:  Genitourinary Comments: Incontinent for both  bowel and bladder.   Musculoskeletal: Normal range of motion. She exhibits no edema, tenderness or deformity.   Unsteady gait. Guarding right leg when turning.    Lymphadenopathy:    She has no cervical adenopathy.  Neurological: She is alert.  Skin: Skin is warm and dry. No rash noted. No erythema. No pallor.  Skin intact.   Psychiatric: She has a normal mood and affect.    Labs reviewed:  Recent Labs  12/21/15 01/24/16 06/06/16  NA 128* 129* 136*  K 4.6 4.2 5.0  BUN 15 9 23*  CREATININE 0.7 0.7 0.9    Recent Labs  10/26/15  AST 27  ALT 29  ALKPHOS 98    Recent Labs  12/21/15 01/24/16 03/21/16  WBC 6.1 4.6 7.2  HGB 13.9 12.7 11.8*  HCT 42 39 37  PLT 536* 474* 587*   Lab Results  Component Value Date   TSH 3.697 05/30/2015   Lab Results  Component Value Date   HGBA1C 6.0 12/02/2008   Lab Results  Component Value Date   CHOL 149 06/06/2016   HDL 54 06/06/2016   LDLCALC 83 06/06/2016   LDLDIRECT 141.6 12/02/2008   TRIG 60 06/06/2016   CHOLHDL 2 10/05/2014   Assessment/Plan 1. Right hip pain Current regimen effective. Continue to monitor.   2. Chronic diastolic CHF (congestive heart failure) (HCC) Stable. No recent abrupt weight gain. Exam findings negative for shortness of breath, wheezing, cough or rales. Continue on Metoprolol. Continue to monitor.   3. Hypertensive heart disease with heart failure (HCC) B/p stable. Continue on amlodipine, Metoprolol and Valsartan. Continue to monitor.   4. Slow transit constipation Current regimen effective. Continue to encourage oral intake and fluids.   5. Neuropathic pain Continue on lyrica.   6. Urinary symptoms  Burning sensation with voiding.will obtain urine specimen for U/A and C/S rule out UTI.      Family/ staff Communication: Reviewed plan of care with patient and facility Nurse supervisor.   Labs/tests ordered:  urine specimen for U/A and C/S rule out UTI.

## 2016-10-10 ENCOUNTER — Non-Acute Institutional Stay (SKILLED_NURSING_FACILITY): Payer: Medicare Other | Admitting: Family

## 2016-10-10 ENCOUNTER — Encounter: Payer: Self-pay | Admitting: Family

## 2016-10-10 DIAGNOSIS — I5032 Chronic diastolic (congestive) heart failure: Secondary | ICD-10-CM

## 2016-10-10 DIAGNOSIS — I110 Hypertensive heart disease with heart failure: Secondary | ICD-10-CM | POA: Diagnosis not present

## 2016-10-10 DIAGNOSIS — K219 Gastro-esophageal reflux disease without esophagitis: Secondary | ICD-10-CM

## 2016-10-10 DIAGNOSIS — K5901 Slow transit constipation: Secondary | ICD-10-CM | POA: Diagnosis not present

## 2016-10-10 DIAGNOSIS — I2510 Atherosclerotic heart disease of native coronary artery without angina pectoris: Secondary | ICD-10-CM

## 2016-10-10 DIAGNOSIS — R399 Unspecified symptoms and signs involving the genitourinary system: Secondary | ICD-10-CM | POA: Diagnosis not present

## 2016-10-10 NOTE — Progress Notes (Signed)
Patient ID: Becky Morales, female   DOB: Jan 27, 1920, 82 y.o.   MRN: [id number]  Location:  [facility] Room Number: 906 A Place of Service:  SNF (31) Provider:  Shayle Donahoo FNP-C   Blanchie Serve, MD  Patient Care Team: Blanchie Serve, MD as PCP - General (Internal Medicine)  Extended Emergency Contact Information Primary Emergency Contact: Jari Pigg of [place] Phone: [phone] Work Phone: [phone] Relation: Friend Secondary Emergency Contact: [place] of [place] Phone: [phone] Relation: Friend  Code Status: DNR  Goals of care: Advanced Directive information Advanced Directives 10/10/2016  Does Patient Have a Medical Advance Directive? Yes  Type of Advance Directive Out of facility DNR (pink MOST or yellow form)  Does patient want to make changes to medical advance directive? -  Copy of [place] in Chart? -  Would patient like information on creating a medical advance directive? -  Pre-existing out of facility DNR order (yellow form or pink MOST form) Yellow form placed in chart (order not valid for inpatient use)     Chief Complaint  Patient presents with  . Medical Management of Chronic Issues    Routine Visit    HPI:  Pt is a 82 y.o. female seen today at [facility]  for medical management of chronic diseases. She has a medical history of HTN, CHF, CAD, GERD, Neuropathy, OA, Anxiety among other conditions. She is seen in her room today. She states having slight burning sensation with urination though states not as bad as previous one.She has been getting out of bed daily and enjoys sitting at the dinning talking with other residence. She also enjoys participating in the facility activities. She has had no recent fall episode or hospital admission since prior visit. Facility Nurse reports no new concerns.     Past Medical History:  Diagnosis  Date  . ALLERGIC RHINITIS 04/13/2007  . Arthritis   . CAD (coronary artery disease)    NSTEMI.  LHC 09/03/11: pLAD 40-50%, pDx 80-90% (very small), lateral branch of OM 99%, m-dCFX 40%, oRCA 80%, m-dRCA 50-70%, EF 55%.  She had PCI with BMS to lateral branch of the OM.  RCA was tx medically.   . Cancer ([place])   . CARCINOID TUMOR 04/13/2007  . CHOLELITHIASIS 05/02/2010  . Dermatophytosis of nail   . Diphtheria    age 57  . Environmental allergies    has mucus in throat  . GERD 04/13/2007  . HYPERGLYCEMIA 04/13/2007  . HYPERTENSION 04/13/2007  . Hyponatremia   . Murmur    echo 11/12: EF 55-60%, mod calcified AV annulus, mild AI, mild MR  . UTI (urinary tract infection) 03/2015   Past Surgical History:  Procedure Laterality Date  . CARDIAC SURGERY    . COLON SURGERY     CANCER  . LEFT HEART CATHETERIZATION WITH CORONARY ANGIOGRAM N/A 09/03/2011   Procedure: LEFT HEART CATHETERIZATION WITH CORONARY ANGIOGRAM;  Surgeon: Peter M Martinique, MD;  Location: [facility] CATH LAB;  Service: Cardiovascular;  Laterality: N/A;  . PERCUTANEOUS CORONARY STENT INTERVENTION (PCI-S)  09/03/2011   Procedure: PERCUTANEOUS CORONARY STENT INTERVENTION (PCI-S);  Surgeon: Peter M Martinique, MD;  Location: [facility] CATH LAB;  Service: Cardiovascular;;  . SMALL BOWEL REPAIR    . TONSILLECTOMY     many years ago    Allergies  Allergen Reactions  . Ciprofloxacin     emesis  . Paxil [Paroxetine Hcl] Nausea  And Vomiting    Allergies as of 10/10/2016      Reactions   Ciprofloxacin    emesis   Paxil [paroxetine Hcl] Nausea And Vomiting      Medication List       Accurate as of 10/10/16 12:24 PM. Always use your most recent med list.          acetaminophen 500 MG tablet Commonly known as:  TYLENOL Take 1,000 mg by mouth 2 (two) times daily.   amLODipine 10 MG tablet Commonly known as:  NORVASC Take 10 mg by mouth daily. For HTN   aspirin 81 MG tablet Take 81 mg by mouth daily.   bisacodyl 10 MG  suppository Commonly known as:  DULCOLAX Place 1 suppository (10 mg total) rectally daily as needed for moderate constipation.   calcium-vitamin D 500-200 MG-UNIT tablet Commonly known as:  OSCAL WITH D Take 1 tablet by mouth 2 (two) times daily.   loperamide 2 MG capsule Commonly known as:  IMODIUM Take 4 mg by mouth as needed for diarrhea or loose stools.   metoprolol 50 MG tablet Commonly known as:  LOPRESSOR Take 1 tablet (50 mg total) by mouth 2 (two) times daily.   MULTI VITAMIN DAILY Tabs Take 1 tablet by mouth daily. Pt. Not sure of dose.   nitroGLYCERIN 0.4 MG SL tablet Commonly known as:  NITROSTAT Place 1 tablet (0.4 mg total) under the tongue every 5 (five) minutes as needed for chest pain.   omeprazole 20 MG capsule Commonly known as:  PRILOSEC Take 20 mg by mouth daily. May sprinkle over food.   pregabalin 50 MG capsule Commonly known as:  LYRICA Take one capsule by mouth three times daily for neuropathy   senna 8.6 MG tablet Commonly known as:  SENOKOT Take 1 tablet by mouth 2 (two) times daily.   sodium chloride 1 g tablet Take 1 g by mouth 2 (two) times daily with a meal.   traMADol 50 MG tablet Commonly known as:  ULTRAM 1 tab (50 mg)  by mouth for pain 2-5/10 every 6 hours , 2 tabs =  (100 mg) by mouth for pain 6-10/10 every 6 hours as needed   UNABLE TO FIND Med Name: Med Pass take 164ml by mouth three times daily.   valsartan 320 MG tablet Commonly known as:  DIOVAN Take 1 tablet (320 mg total) by mouth daily.       Review of Systems  Constitutional: Negative for activity change, appetite change, chills, fatigue and fever.  HENT: Negative for congestion, rhinorrhea, sinus pressure, sneezing and sore throat.   Eyes: Negative.   Respiratory: Negative for cough, chest tightness, shortness of breath and wheezing.   Cardiovascular: Negative for chest pain, palpitations and leg swelling.  Gastrointestinal: Negative for abdominal distention,  abdominal pain, constipation, diarrhea, nausea and vomiting.  Endocrine: Negative.   Genitourinary: Negative for dysuria, flank pain, frequency and urgency.       Burning sensation with voiding.   Musculoskeletal: Positive for gait problem. Negative for back pain.       Right leg pain under control with current tylenol.   Skin: Negative for color change, pallor and rash.  Neurological: Negative for dizziness, seizures, weakness, light-headedness and headaches.  Hematological: Does not bruise/bleed easily.  Psychiatric/Behavioral: Negative for agitation, confusion, hallucinations and sleep disturbance. The patient is not nervous/anxious.     Immunization History  Administered Date(s) Administered  . Influenza Split 07/15/2011, 07/02/2012  . Influenza Whole 08/19/2007, 07/29/2008  .  Influenza,inj,Quad PF,36+ Mos 10/12/2013  . Influenza-Unspecified 08/10/2015  . PPD Test 07/15/2015, 07/29/2015  . Pneumococcal Conjugate-13 12/27/2014  . Pneumococcal Polysaccharide-23 08/22/2003  . Pneumococcal-Unspecified 08/27/2015  . Tdap 05/30/2011   Pertinent  Health Maintenance Due  Topic Date Due  . INFLUENZA VACCINE  05/21/2016  . DEXA SCAN  12/26/2017 (Originally 12/22/1986)  . PNA vac Low Risk Adult  Completed   Fall Risk  12/27/2014  Falls in the past year? No    Vitals:   10/10/16 1222  BP: 131/80  Pulse: 64  Resp: 17  Temp: 98.4 F (36.9 C)  TempSrc: Oral  SpO2: 96%  Weight: 125 lb 3.2 oz (56.8 kg)  Height: 5\' 7"  (1.702 m)   Body mass index is 19.61 kg/m. Physical Exam  Constitutional: She appears well-developed and well-nourished.  Pleasant elderly in no acute distress.  HENT:  Head: Normocephalic.  Mouth/Throat: Oropharynx is clear and moist. No oropharyngeal exudate.  Eyes: Conjunctivae and EOM are normal. Pupils are equal, round, and reactive to light. Right eye exhibits no discharge. Left eye exhibits no discharge. No scleral icterus.  Neck: Normal range of motion. No  JVD present. No thyromegaly present.  Cardiovascular: Normal rate, regular rhythm, normal heart sounds and intact distal pulses.  Exam reveals no gallop and no friction rub.   No murmur heard. Pulmonary/Chest: Effort normal and breath sounds normal. No respiratory distress. She has no wheezes. She has no rales.  Abdominal: Soft. Bowel sounds are normal. She exhibits no distension and no mass. There is no rebound and no guarding.  Genitourinary:  Genitourinary Comments: Incontinent for both bowel and bladder.   Musculoskeletal: Normal range of motion. She exhibits no edema, tenderness or deformity.   Unsteady gait.     Lymphadenopathy:    She has no cervical adenopathy.  Neurological: She is alert.  Skin: Skin is warm and dry. No rash noted. No erythema. No pallor.  Skin intact.   Psychiatric: She has a normal mood and affect.    Labs reviewed:  Recent Labs  12/21/15 01/24/16 06/06/16  NA 128* 129* 136*  K 4.6 4.2 5.0  BUN 15 9 23*  CREATININE 0.7 0.7 0.9    Recent Labs  10/26/15  AST 27  ALT 29  ALKPHOS 98    Recent Labs  12/21/15 01/24/16 03/21/16  WBC 6.1 4.6 7.2  HGB 13.9 12.7 11.8*  HCT 42 39 37  PLT 536* 474* 587*   Lab Results  Component Value Date   TSH 3.697 05/30/2015   Lab Results  Component Value Date   HGBA1C 6.0 12/02/2008   Lab Results  Component Value Date   CHOL 149 06/06/2016   HDL 54 06/06/2016   LDLCALC 83 06/06/2016   LDLDIRECT 141.6 12/02/2008   TRIG 60 06/06/2016   CHOLHDL 2 10/05/2014   Assessment/Plan  Hypertension  B/p stable. Continue Metoprolol.Monitor BMP   Chronic diastolic CHF (congestive heart failure) (HCC) Stable. No abrupt weight gain. Exam findings negative. Continue Metoprolol. Monitor weight.   CAD Chest pain free. Continue on ASA.    Slow transit constipation Current regimen effective. Continue to encourage hydration and oral intake.OOB daily.   GERD Asymptomatic. Continue on Omeprazole.   Urinary Tract  symptoms Burning sensation with voiding reported. Afebrile. Will obtain urine specimen for U/A and C/S   Family/ staff Communication: Reviewed plan of care with patient and facility Nurse supervisor.   Labs/tests ordered:  urine specimen for U/A and C/S

## 2016-11-04 ENCOUNTER — Non-Acute Institutional Stay (SKILLED_NURSING_FACILITY): Payer: Medicare Other | Admitting: Family

## 2016-11-04 DIAGNOSIS — R600 Localized edema: Secondary | ICD-10-CM

## 2016-11-04 DIAGNOSIS — R05 Cough: Secondary | ICD-10-CM | POA: Diagnosis not present

## 2016-11-04 DIAGNOSIS — I5032 Chronic diastolic (congestive) heart failure: Secondary | ICD-10-CM | POA: Diagnosis not present

## 2016-11-04 DIAGNOSIS — I2510 Atherosclerotic heart disease of native coronary artery without angina pectoris: Secondary | ICD-10-CM

## 2016-11-04 DIAGNOSIS — I110 Hypertensive heart disease with heart failure: Secondary | ICD-10-CM | POA: Diagnosis not present

## 2016-11-04 DIAGNOSIS — R059 Cough, unspecified: Secondary | ICD-10-CM

## 2016-11-04 MED ORDER — IPRATROPIUM-ALBUTEROL 0.5-2.5 (3) MG/3ML IN SOLN: 3.0000 mL | Freq: Four times a day (QID) | RESPIRATORY_TRACT | Status: AC

## 2016-11-04 NOTE — Progress Notes (Signed)
Patient ID: JEFFIFER ONEIL, female   DOB: [birth year], 83 y.o.   MRN: [id number]  Location:  [facility] Room Number: 906 A Place of Service:  SNF (31) Provider:  Dinah Ngetich FNP-C   Blanchie Serve, MD  Patient Care Team: Blanchie Serve, MD as PCP - General (Internal Medicine)  Extended Emergency Contact Information Primary Emergency Contact: Jari Pigg of [place] Phone: [phone] Work Phone: [phone] Relation: Friend Secondary Emergency Contact: Jordan of [place] Phone: [phone] Relation: Friend  Code Status: DNR  Goals of care: Advanced Directive information Advanced Directives 10/10/2016  Does Patient Have a Medical Advance Directive? Yes  Type of Advance Directive Out of facility DNR (pink MOST or yellow form)  Does patient want to make changes to medical advance directive? -  Copy of [place] in Chart? -  Would patient like information on creating a medical advance directive? -  Pre-existing out of facility DNR order (yellow form or pink MOST form) Yellow form placed in chart (order not valid for inpatient use)     Chief Complaint  Patient presents with  . Medical Management of Chronic Issues    HPI:  Pt is a 83 y.o. female seen today at [facility]  for medical management of chronic diseases. She has a medical history of HTN, CHF, CAD, GERD, Neuropathy, OA, Anxiety among other conditions. She is seen in her room today. She complains of having a cold for several days. She denies any fever, chills or shortness of breath. She continues to get OOB daily.She has had a progressive weight gain 5 lbs over two months though weight gain beneficial to patient due to previous weight loss.No recent fall episode or hospital admission since prior visit. Facility Nurse reports no new concerns.     Past Medical History:  Diagnosis Date  . ALLERGIC RHINITIS  04/13/2007  . Arthritis   . CAD (coronary artery disease)    NSTEMI.  LHC 09/03/11: pLAD 40-50%, pDx 80-90% (very small), lateral branch of OM 99%, m-dCFX 40%, oRCA 80%, m-dRCA 50-70%, EF 55%.  She had PCI with BMS to lateral branch of the OM.  RCA was tx medically.   . Cancer ([place])   . CARCINOID TUMOR 04/13/2007  . CHOLELITHIASIS 05/02/2010  . Dermatophytosis of nail   . Diphtheria    age 39  . Environmental allergies    has mucus in throat  . GERD 04/13/2007  . HYPERGLYCEMIA 04/13/2007  . HYPERTENSION 04/13/2007  . Hyponatremia   . Murmur    echo 11/12: EF 55-60%, mod calcified AV annulus, mild AI, mild MR  . UTI (urinary tract infection) 03/2015   Past Surgical History:  Procedure Laterality Date  . CARDIAC SURGERY    . COLON SURGERY     CANCER  . LEFT HEART CATHETERIZATION WITH CORONARY ANGIOGRAM N/A 09/03/2011   Procedure: LEFT HEART CATHETERIZATION WITH CORONARY ANGIOGRAM;  Surgeon: Peter M Martinique, MD;  Location: [facility] CATH LAB;  Service: Cardiovascular;  Laterality: N/A;  . PERCUTANEOUS CORONARY STENT INTERVENTION (PCI-S)  09/03/2011   Procedure: PERCUTANEOUS CORONARY STENT INTERVENTION (PCI-S);  Surgeon: Peter M Martinique, MD;  Location: [facility] CATH LAB;  Service: Cardiovascular;;  . SMALL BOWEL REPAIR    . TONSILLECTOMY     many years ago    Allergies  Allergen Reactions  . Ciprofloxacin     emesis  . Paxil [Paroxetine Hcl] Nausea And Vomiting  Allergies as of 11/04/2016      Reactions   Ciprofloxacin    emesis   Paxil [paroxetine Hcl] Nausea And Vomiting      Medication List       Accurate as of 11/04/16  3:01 PM. Always use your most recent med list.          acetaminophen 500 MG tablet Commonly known as:  TYLENOL Take 1,000 mg by mouth 2 (two) times daily.   amLODipine 10 MG tablet Commonly known as:  NORVASC Take 10 mg by mouth daily. For HTN   aspirin 81 MG tablet Take 81 mg by mouth daily.   bisacodyl 10 MG suppository Commonly known as:   DULCOLAX Place 1 suppository (10 mg total) rectally daily as needed for moderate constipation.   calcium-vitamin D 500-200 MG-UNIT tablet Commonly known as:  OSCAL WITH D Take 1 tablet by mouth 2 (two) times daily.   loperamide 2 MG capsule Commonly known as:  IMODIUM Take 4 mg by mouth as needed for diarrhea or loose stools.   metoprolol 50 MG tablet Commonly known as:  LOPRESSOR Take 1 tablet (50 mg total) by mouth 2 (two) times daily.   MULTI VITAMIN DAILY Tabs Take 1 tablet by mouth daily. Pt. Not sure of dose.   nitroGLYCERIN 0.4 MG SL tablet Commonly known as:  NITROSTAT Place 1 tablet (0.4 mg total) under the tongue every 5 (five) minutes as needed for chest pain.   omeprazole 20 MG capsule Commonly known as:  PRILOSEC Take 20 mg by mouth daily. May sprinkle over food.   pregabalin 50 MG capsule Commonly known as:  LYRICA Take one capsule by mouth three times daily for neuropathy   senna 8.6 MG tablet Commonly known as:  SENOKOT Take 1 tablet by mouth 2 (two) times daily.   sodium chloride 1 g tablet Take 1 g by mouth 2 (two) times daily with a meal.   traMADol 50 MG tablet Commonly known as:  ULTRAM 1 tab (50 mg)  by mouth for pain 2-5/10 every 6 hours , 2 tabs =  (100 mg) by mouth for pain 6-10/10 every 6 hours as needed   UNABLE TO FIND Med Name: Med Pass take 179ml by mouth three times daily.   valsartan 320 MG tablet Commonly known as:  DIOVAN Take 1 tablet (320 mg total) by mouth daily.       Review of Systems  Constitutional: Negative for activity change, appetite change, chills, fatigue and fever.  HENT: Negative for congestion, rhinorrhea, sinus pressure, sneezing and sore throat.   Eyes: Negative.   Respiratory: Negative for cough, chest tightness, shortness of breath and wheezing.   Cardiovascular: Negative for chest pain, palpitations and leg swelling.  Gastrointestinal: Negative for abdominal distention, abdominal pain, constipation,  diarrhea, nausea and vomiting.  Endocrine: Negative for cold intolerance, heat intolerance, polydipsia, polyphagia and polyuria.  Genitourinary: Negative for dysuria, flank pain, frequency and urgency.  Musculoskeletal: Positive for gait problem. Negative for back pain.       Right leg pain under control with current tylenol.   Skin: Negative for color change, pallor and rash.  Neurological: Negative for dizziness, seizures, weakness, light-headedness and headaches.  Hematological: Does not bruise/bleed easily.  Psychiatric/Behavioral: Negative for agitation, confusion, hallucinations and sleep disturbance. The patient is not nervous/anxious.     Immunization History  Administered Date(s) Administered  . Influenza Split 07/15/2011, 07/02/2012  . Influenza Whole 08/19/2007, 07/29/2008  . Influenza,inj,Quad PF,36+ Mos 10/12/2013  .  Influenza-Unspecified 08/10/2015  . PPD Test 07/15/2015, 07/29/2015  . Pneumococcal Conjugate-13 12/27/2014  . Pneumococcal Polysaccharide-23 08/22/2003  . Pneumococcal-Unspecified 08/27/2015  . Tdap 05/30/2011   Pertinent  Health Maintenance Due  Topic Date Due  . INFLUENZA VACCINE  05/21/2016  . DEXA SCAN  12/26/2017 (Originally 12/22/1986)  . PNA vac Low Risk Adult  Completed   Fall Risk  12/27/2014  Falls in the past year? No    Vitals:   11/04/16 1100  BP: (!) 134/50  Pulse: 68  Resp: 18  Temp: 97 F (36.1 C)  SpO2: 93%  Weight: 123 lb 6.4 oz (56 kg)  Height: 5\' 7"  (1.702 m)   Body mass index is 19.33 kg/m. Physical Exam  Constitutional: She appears well-developed and well-nourished.  elderly in no acute distress.  HENT:  Head: Normocephalic.  Mouth/Throat: Oropharynx is clear and moist. No oropharyngeal exudate.  Eyes: Conjunctivae and EOM are normal. Pupils are equal, round, and reactive to light. Right eye exhibits no discharge. Left eye exhibits no discharge. No scleral icterus.  Neck: Normal range of motion. No JVD present. No  thyromegaly present.  Cardiovascular: Normal rate, regular rhythm, normal heart sounds and intact distal pulses.  Exam reveals no gallop and no friction rub.   No murmur heard. Pulmonary/Chest: Effort normal. No respiratory distress. She has no rales.  Bilateral lung wheezes noted   Abdominal: Soft. Bowel sounds are normal. She exhibits no distension and no mass. There is no rebound and no guarding.  Genitourinary:  Genitourinary Comments: Incontinent for both bowel and bladder.   Musculoskeletal: She exhibits no tenderness or deformity.  Moves x 4 extremities.unsteady gait.1-2+ edema to lower extremities.   Lymphadenopathy:    She has no cervical adenopathy.  Neurological: She is alert.  Skin: Skin is warm and dry. No rash noted. No erythema. No pallor.  No skin breakdown   Psychiatric: She has a normal mood and affect.    Labs reviewed:  Recent Labs  12/21/15 01/24/16 06/06/16  NA 128* 129* 136*  K 4.6 4.2 5.0  BUN 15 9 23*  CREATININE 0.7 0.7 0.9   Lab Results  Component Value Date   TSH 3.697 05/30/2015   Lab Results  Component Value Date   HGBA1C 6.0 12/02/2008   Lab Results  Component Value Date   CHOL 149 06/06/2016   HDL 54 06/06/2016   LDLCALC 83 06/06/2016   LDLDIRECT 141.6 12/02/2008   TRIG 60 06/06/2016   CHOLHDL 2 10/05/2014   Assessment/Plan Edema  Bilateral 1-2 + to lower extremities.Will monitor daily weight.Apply Ted hose on in the morning and off at bedtime.   Chronic diastolic CHF (congestive heart failure) (HCC) Stable. Has had progressive weight gain over two months.edema 1-2+ to lower extremities.Bilateral exp wheezes noted.Continue Metoprolol. Daily weight.continue to monitor. Will add diuretic if worsening edema.    Hypertension  B/p stable. Continue Metoprolol.Monitor BMP  Cough  Afebrile. Bilateral Exp wheezes on auscultation. Will obtain portable CXR PA/Lat rule out PNA. Duoneb 0.5-2.5( 3)mg /3 ml inhale 3 ml every 6 hours X 5 days.  Continue current cough syrup.   CAD  Chest pain free.continue on ASA, metoprolol, amlodipine and Nitro.      Family/ staff Communication: Reviewed plan of care with patient and facility Nurse supervisor.   Labs/tests ordered: Portable CXR PA/Lat rule out PNA.

## 2016-12-04 ENCOUNTER — Non-Acute Institutional Stay (SKILLED_NURSING_FACILITY): Payer: Medicare Other | Admitting: Family

## 2016-12-04 DIAGNOSIS — I5032 Chronic diastolic (congestive) heart failure: Secondary | ICD-10-CM

## 2016-12-04 DIAGNOSIS — I110 Hypertensive heart disease with heart failure: Secondary | ICD-10-CM | POA: Diagnosis not present

## 2016-12-04 DIAGNOSIS — R05 Cough: Secondary | ICD-10-CM

## 2016-12-04 DIAGNOSIS — K219 Gastro-esophageal reflux disease without esophagitis: Secondary | ICD-10-CM

## 2016-12-04 DIAGNOSIS — R059 Cough, unspecified: Secondary | ICD-10-CM

## 2016-12-04 MED ORDER — IPRATROPIUM-ALBUTEROL 0.5-2.5 (3) MG/3ML IN SOLN: 3.0000 mL | Freq: Four times a day (QID) | RESPIRATORY_TRACT | Status: DC

## 2016-12-04 NOTE — Progress Notes (Signed)
Patient ID: Becky Morales, female   DOB: 1920-08-05, 83 y.o.   MRN: [id number]  Location:  [place] Room Number: 906 A  Place of Service:  SNF (31) Provider:  Dinah Ngetich FNP-C   Blanchie Serve, MD  Patient Care Team: Blanchie Serve, MD as PCP - General (Internal Medicine)  Extended Emergency Contact Information Primary Emergency Contact: Jari Pigg of [place] Phone: [phone] Work Phone: [phone] Relation: Friend Secondary Emergency Contact: Wayne of [place] Phone: [phone] Relation: Friend  Code Status: DNR  Goals of care: Advanced Directive information Advanced Directives 10/10/2016  Does Patient Have a Medical Advance Directive? Yes  Type of Advance Directive Out of facility DNR (pink MOST or yellow form)  Does patient want to make changes to medical advance directive? -  Copy of [place] in Chart? -  Would patient like information on creating a medical advance directive? -  Pre-existing out of facility DNR order (yellow form or pink MOST form) Yellow form placed in chart (order not valid for inpatient use)     Chief Complaint  Patient presents with  . Medical Management of Chronic Issues    HPI:  Pt is a 83 y.o. female seen today at [facility]  for medical management of chronic diseases.She has a medical history of HTN, CHF, CAD, GERD, Neuropathy, OA, Anxiety among other conditions. She is seen in her room today. She denies any acute issues during visit.She continues to enjoy getting out of bed daily and participating in the facility activities.She had progressively gained weight in the past several months which was beneficial for her due to malnutrition. Recently seen by Registered dietician now off Med pass supplements.Her weight is stable this visit.Facility Nurse reports no new concerns.   Past Medical History:  Diagnosis Date  .  ALLERGIC RHINITIS 04/13/2007  . Arthritis   . CAD (coronary artery disease)    NSTEMI.  LHC 09/03/11: pLAD 40-50%, pDx 80-90% (very small), lateral branch of OM 99%, m-dCFX 40%, oRCA 80%, m-dRCA 50-70%, EF 55%.  She had PCI with BMS to lateral branch of the OM.  RCA was tx medically.   . Cancer ([place])   . CARCINOID TUMOR 04/13/2007  . CHOLELITHIASIS 05/02/2010  . Dermatophytosis of nail   . Diphtheria    age 47  . Environmental allergies    has mucus in throat  . GERD 04/13/2007  . HYPERGLYCEMIA 04/13/2007  . HYPERTENSION 04/13/2007  . Hyponatremia   . Murmur    echo 11/12: EF 55-60%, mod calcified AV annulus, mild AI, mild MR  . UTI (urinary tract infection) 03/2015   Past Surgical History:  Procedure Laterality Date  . CARDIAC SURGERY    . COLON SURGERY     CANCER  . LEFT HEART CATHETERIZATION WITH CORONARY ANGIOGRAM N/A 09/03/2011   Procedure: LEFT HEART CATHETERIZATION WITH CORONARY ANGIOGRAM;  Surgeon: Peter M Martinique, MD;  Location: [facility] CATH LAB;  Service: Cardiovascular;  Laterality: N/A;  . PERCUTANEOUS CORONARY STENT INTERVENTION (PCI-S)  09/03/2011   Procedure: PERCUTANEOUS CORONARY STENT INTERVENTION (PCI-S);  Surgeon: Peter M Martinique, MD;  Location: [facility] CATH LAB;  Service: Cardiovascular;;  . SMALL BOWEL REPAIR    . TONSILLECTOMY     many years ago    Allergies  Allergen Reactions  . Ciprofloxacin     emesis  . Paxil [Paroxetine Hcl] Nausea And Vomiting    Allergies as of 12/04/2016  Reactions   Ciprofloxacin    emesis   Paxil [paroxetine Hcl] Nausea And Vomiting      Medication List       Accurate as of 12/04/16  2:21 PM. Always use your most recent med list.          acetaminophen 500 MG tablet Commonly known as:  TYLENOL Take 1,000 mg by mouth 2 (two) times daily.   amLODipine 10 MG tablet Commonly known as:  NORVASC Take 10 mg by mouth daily. For HTN   aspirin 81 MG tablet Take 81 mg by mouth daily.   bisacodyl 10 MG suppository Commonly known  as:  DULCOLAX Place 1 suppository (10 mg total) rectally daily as needed for moderate constipation.   calcium-vitamin D 500-200 MG-UNIT tablet Commonly known as:  OSCAL WITH D Take 1 tablet by mouth 2 (two) times daily.   loperamide 2 MG capsule Commonly known as:  IMODIUM Take 4 mg by mouth as needed for diarrhea or loose stools.   metoprolol 50 MG tablet Commonly known as:  LOPRESSOR Take 1 tablet (50 mg total) by mouth 2 (two) times daily.   MULTI VITAMIN DAILY Tabs Take 1 tablet by mouth daily. Pt. Not sure of dose.   nitroGLYCERIN 0.4 MG SL tablet Commonly known as:  NITROSTAT Place 1 tablet (0.4 mg total) under the tongue every 5 (five) minutes as needed for chest pain.   omeprazole 20 MG capsule Commonly known as:  PRILOSEC Take 20 mg by mouth daily. May sprinkle over food.   pregabalin 50 MG capsule Commonly known as:  LYRICA Take one capsule by mouth three times daily for neuropathy   senna 8.6 MG tablet Commonly known as:  SENOKOT Take 1 tablet by mouth 2 (two) times daily.   sodium chloride 1 g tablet Take 1 g by mouth 2 (two) times daily with a meal.   traMADol 50 MG tablet Commonly known as:  ULTRAM 1 tab (50 mg)  by mouth for pain 2-5/10 every 6 hours , 2 tabs =  (100 mg) by mouth for pain 6-10/10 every 6 hours as needed   valsartan 320 MG tablet Commonly known as:  DIOVAN Take 1 tablet (320 mg total) by mouth daily.       Review of Systems  Constitutional: Negative for activity change, appetite change, chills, fatigue and fever.  HENT: Negative for congestion, rhinorrhea, sinus pressure, sneezing and sore throat.   Eyes: Negative.   Respiratory: Negative for cough, chest tightness, shortness of breath and wheezing.   Cardiovascular: Negative for chest pain, palpitations and leg swelling.  Gastrointestinal: Negative for abdominal distention, abdominal pain, constipation, diarrhea, nausea and vomiting.  Endocrine: Negative for cold intolerance, heat  intolerance, polydipsia, polyphagia and polyuria.  Genitourinary: Negative for dysuria, flank pain, frequency and urgency.  Musculoskeletal: Positive for gait problem. Negative for back pain.       Right leg pain under control with pain regimen.   Skin: Negative for color change, pallor and rash.  Neurological: Negative for dizziness, seizures, weakness, light-headedness and headaches.  Hematological: Does not bruise/bleed easily.  Psychiatric/Behavioral: Negative for agitation, confusion, hallucinations and sleep disturbance. The patient is not nervous/anxious.     Immunization History  Administered Date(s) Administered  . Influenza Split 07/15/2011, 07/02/2012  . Influenza Whole 08/19/2007, 07/29/2008  . Influenza,inj,Quad PF,36+ Mos 10/12/2013  . Influenza-Unspecified 08/10/2015  . PPD Test 07/15/2015, 07/29/2015  . Pneumococcal Conjugate-13 12/27/2014  . Pneumococcal Polysaccharide-23 08/22/2003  . Pneumococcal-Unspecified 08/27/2015  .  Tdap 05/30/2011   Pertinent  Health Maintenance Due  Topic Date Due  . INFLUENZA VACCINE  05/21/2016  . DEXA SCAN  12/26/2017 (Originally 12/22/1986)  . PNA vac Low Risk Adult  Completed   Fall Risk  12/27/2014  Falls in the past year? No    Vitals:   12/04/16 1115  BP: (!) 129/59  Pulse: (!) 56  Resp: 18  Temp: 97.6 F (36.4 C)  SpO2: 96%  Weight: 128 lb (58.1 kg)  Height: 5\' 7"  (1.702 m)   Body mass index is 20.05 kg/m. Physical Exam  Constitutional: She appears well-developed and well-nourished. No distress.  HENT:  Head: Normocephalic.  Mouth/Throat: Oropharynx is clear and moist. No oropharyngeal exudate.  Eyes: Conjunctivae and EOM are normal. Pupils are equal, round, and reactive to light. Right eye exhibits no discharge. Left eye exhibits no discharge. No scleral icterus.  Neck: Normal range of motion. No JVD present. No thyromegaly present.  Cardiovascular: Normal rate, regular rhythm, normal heart sounds and intact distal  pulses.  Exam reveals no gallop and no friction rub.   No murmur heard. Pulmonary/Chest: Effort normal. No respiratory distress. She has no rales.  Bilateral lung wheezes  Abdominal: Soft. Bowel sounds are normal. She exhibits no distension and no mass. There is no rebound and no guarding.  Genitourinary:  Genitourinary Comments: Incontinent  Musculoskeletal: She exhibits no edema, tenderness or deformity.  Moves x 4 extremities.unsteady gait.  Lymphadenopathy:    She has no cervical adenopathy.  Neurological: She is alert.  Skin: Skin is warm and dry. No rash noted. No erythema. No pallor.  Skin intact  Psychiatric: She has a normal mood and affect.    Labs reviewed:  Recent Labs  01/24/16 06/06/16 07/25/16  NA 129* 136* 131*  K 4.2 5.0 4.7  BUN 9 23* 21  CREATININE 0.7 0.9 0.8   Lab Results  Component Value Date   TSH 4.34 07/25/2016   Lab Results  Component Value Date   HGBA1C 6.0 12/02/2008   Lab Results  Component Value Date   CHOL 145 07/25/2016   HDL 57 07/25/2016   LDLCALC 76 07/25/2016   LDLDIRECT 141.6 12/02/2008   TRIG 64 07/25/2016   CHOLHDL 2 10/05/2014   Assessment/Plan Chronic diastolic CHF (congestive heart failure) ([place]) No recent weight gain.No edema noted.Bilateral exp wheezes with occasional cough noted.Continue Metoprolol. Daily weight.  Hypertension  B/p stable. Continue Metoprolol.Monitor BMP  Cough  Afebrile. Bilateral wheezes noted.CXR 11/05/2016 was negative for PNA. Will restart Duoneb 0.5-2.5( 3)mg /3 ml inhale 3 ml every 6 hours.  GERD Stable.Continue on omeprazole.   Family/ staff Communication: Reviewed plan of care with patient and facility Nurse supervisor.   Labs/tests ordered: Portable CXR PA/Lat rule out PNA.

## 2016-12-26 ENCOUNTER — Non-Acute Institutional Stay (SKILLED_NURSING_FACILITY): Payer: Medicare Other | Admitting: Internal Medicine

## 2016-12-26 ENCOUNTER — Encounter: Payer: Self-pay | Admitting: Internal Medicine

## 2016-12-26 DIAGNOSIS — I2510 Atherosclerotic heart disease of native coronary artery without angina pectoris: Secondary | ICD-10-CM | POA: Diagnosis not present

## 2016-12-26 DIAGNOSIS — M170 Bilateral primary osteoarthritis of knee: Secondary | ICD-10-CM | POA: Diagnosis not present

## 2016-12-26 DIAGNOSIS — I110 Hypertensive heart disease with heart failure: Secondary | ICD-10-CM

## 2016-12-26 DIAGNOSIS — I5032 Chronic diastolic (congestive) heart failure: Secondary | ICD-10-CM

## 2016-12-26 DIAGNOSIS — R1310 Dysphagia, unspecified: Secondary | ICD-10-CM | POA: Diagnosis not present

## 2016-12-26 DIAGNOSIS — M792 Neuralgia and neuritis, unspecified: Secondary | ICD-10-CM | POA: Diagnosis not present

## 2016-12-26 NOTE — Progress Notes (Signed)
Patient ID: Becky Morales, female   DOB: 01-Nov-1919, 83 y.o.   MRN: [id number]     [facility]  PCP: Blanchie Serve, MD  Code Status: Full Code  Allergies  Allergen Reactions  . Ciprofloxacin     emesis  . Paxil [Paroxetine Hcl] Nausea And Vomiting    Chief Complaint  Patient presents with  . Medical Management of Chronic Issues    Routine Visit      HPI:  83 y.o. patient is seen for routine visit. She has been at her baseline. She is in bed all day and refuses to get out of the bed. She denies any concern this visit.  Review of Systems:  Constitutional: Negative for fever, chills.  HENT: Negative for congestion, sore throat.   positive for hearing loss Eyes: Negative for blurred vision, double vision and discharge.  Respiratory: Negative for shortness of breath and wheezing.   positive for cough with white phlegm for a week. Positive for runny nose. Negative for sore throat. Cardiovascular: Negative for chest pain and palpitation Gastrointestinal: Positive for difficulty with swallowing mainly solid food. She mentions having choking sensation at times. Negative for heartburn, nausea, vomiting, abdominal pain. Had bowel movement yesterday.  Genitourinary: Negative for dysuria.  Musculoskeletal: Negative for falls. Positive for joint pain and muscle aches in her legs Skin: Negative for itching and rash.  Neurological: Negative for dizziness.  positive for numbness and tingling in her feet. Psychiatric/Behavioral: Negative for depression.      Past Medical History:  Diagnosis Date  . ALLERGIC RHINITIS 04/13/2007  . Arthritis   . CAD (coronary artery disease)    NSTEMI.  LHC 09/03/11: pLAD 40-50%, pDx 80-90% (very small), lateral branch of OM 99%, m-dCFX 40%, oRCA 80%, m-dRCA 50-70%, EF 55%.  She had PCI with BMS to lateral branch of the OM.  RCA was tx medically.   . Cancer ([place])   . CARCINOID TUMOR 04/13/2007  . CHOLELITHIASIS 05/02/2010  . Dermatophytosis  of nail   . Diphtheria    age 65  . Environmental allergies    has mucus in throat  . GERD 04/13/2007  . HYPERGLYCEMIA 04/13/2007  . HYPERTENSION 04/13/2007  . Hyponatremia   . Murmur    echo 11/12: EF 55-60%, mod calcified AV annulus, mild AI, mild MR  . UTI (urinary tract infection) 03/2015   Past Surgical History:  Procedure Laterality Date  . CARDIAC SURGERY    . COLON SURGERY     CANCER  . LEFT HEART CATHETERIZATION WITH CORONARY ANGIOGRAM N/A 09/03/2011   Procedure: LEFT HEART CATHETERIZATION WITH CORONARY ANGIOGRAM;  Surgeon: Peter M Martinique, MD;  Location: [facility] CATH LAB;  Service: Cardiovascular;  Laterality: N/A;  . PERCUTANEOUS CORONARY STENT INTERVENTION (PCI-S)  09/03/2011   Procedure: PERCUTANEOUS CORONARY STENT INTERVENTION (PCI-S);  Surgeon: Peter M Martinique, MD;  Location: [facility] CATH LAB;  Service: Cardiovascular;;  . SMALL BOWEL REPAIR    . TONSILLECTOMY     many years ago   Social History:   reports that she has never smoked. She has never used smokeless tobacco. She reports that she does not drink alcohol or use drugs.  Family History  Problem Relation Age of Onset  . Hypertension Mother   . Uterine cancer Mother   . Ovarian cancer Mother   . Hypertension Father   . Stroke Father   . Heart attack Brother   . Lung cancer Sister   . Hypertension Brother   . Hyperlipidemia  Brother     Medications: Allergies as of 12/26/2016      Reactions   Ciprofloxacin    emesis   Paxil [paroxetine Hcl] Nausea And Vomiting      Medication List       Accurate as of 12/26/16  3:55 PM. Always use your most recent med list.          acetaminophen 500 MG tablet Commonly known as:  TYLENOL Take 1,000 mg by mouth 2 (two) times daily.   amLODipine 10 MG tablet Commonly known as:  NORVASC Take 10 mg by mouth daily. For HTN   aspirin 81 MG tablet Take 81 mg by mouth daily.   bisacodyl 10 MG suppository Commonly known as:  DULCOLAX Place 1 suppository (10 mg total)  rectally daily as needed for moderate constipation.   calcium-vitamin D 500-200 MG-UNIT tablet Commonly known as:  OSCAL WITH D Take 1 tablet by mouth 2 (two) times daily.   ipratropium-albuterol 0.5-2.5 (3) MG/3ML Soln Commonly known as:  DUONEB Take 3 mLs by nebulization every 6 (six) hours as needed.   loperamide 2 MG capsule Commonly known as:  IMODIUM Take 4 mg by mouth as needed for diarrhea or loose stools.   metoprolol 50 MG tablet Commonly known as:  LOPRESSOR Take 1 tablet (50 mg total) by mouth 2 (two) times daily.   MULTI VITAMIN DAILY Tabs Take 1 tablet by mouth daily. Pt. Not sure of dose.   nitroGLYCERIN 0.4 MG SL tablet Commonly known as:  NITROSTAT Place 1 tablet (0.4 mg total) under the tongue every 5 (five) minutes as needed for chest pain.   omeprazole 20 MG capsule Commonly known as:  PRILOSEC Take 20 mg by mouth daily. May sprinkle over food.   pregabalin 50 MG capsule Commonly known as:  LYRICA Take one capsule by mouth three times daily for neuropathy   senna 8.6 MG tablet Commonly known as:  SENOKOT Take 1 tablet by mouth 2 (two) times daily.   sodium chloride 1 g tablet Take 1 g by mouth 2 (two) times daily with a meal.   traMADol 50 MG tablet Commonly known as:  ULTRAM 1 tab (50 mg)  by mouth for pain 2-5/10 every 6 hours , 2 tabs =  (100 mg) by mouth for pain 6-10/10 every 6 hours as needed   valsartan 320 MG tablet Commonly known as:  DIOVAN Take 1 tablet (320 mg total) by mouth daily.        Physical Exam: Vitals:   12/26/16 1549  BP: 139/74  Pulse: 76  Resp: 20  Temp: 98.1 F (36.7 C)  TempSrc: Oral  SpO2: 97%  Weight: 128 lb 9.6 oz (58.3 kg)  Height: 5\' 7"  (1.702 m)  Body mass index is 20.14 kg/m.   General- elderly female, frail, thin built in no acute distress Head- normocephalic, atraumatic Throat- moist mucus membrane, Missing teeth Eyes- no pallor, no icterus, no discharge, normal conjunctiva, normal sclera,  has glasses Neck- no cervical lymphadenopathy Cardiovascular- normal s1,s2, no murmur, trace leg edema Respiratory- bilateral clear to auscultation, no wheeze, no rhonchi, no crackles, no use of accessory muscles Abdomen- bowel sounds present, soft, non tender Musculoskeletal- limited knee ROM limited with her severe OA. Able to move her extremities Neurological- alert and oriented to person, place and time Skin- warm and dry   Labs reviewed:  CBC Latest Ref Rng & Units 07/25/2016 03/21/2016 01/24/2016  WBC 10:3/mL 8.3 7.2 4.6  Hemoglobin 12.0 - 16.0  g/dL 8.5(A) 11.8(A) 12.7  Hematocrit 36 - 46 % 29(A) 37 39  Platelets 150 - 399 K/L 421(A) 587(A) 474(A)   CMP Latest Ref Rng & Units 07/25/2016 06/06/2016 01/24/2016  Glucose 65 - 99 mg/dL - - -  BUN 4 - 21 mg/dL 21 23(A) 9  Creatinine 0.5 - 1.1 mg/dL 0.8 0.9 0.7  Sodium 137 - 147 mmol/L 131(A) 136(A) 129(A)  Potassium 3.4 - 5.3 mmol/L 4.7 5.0 4.2  Chloride 101 - 111 mmol/L - - -  CO2 22 - 32 mmol/L - - -  Calcium 8.9 - 10.3 mg/dL - - -  Total Protein 6.5 - 8.1 g/dL - - -  Total Bilirubin 0.3 - 1.2 mg/dL - - -  Alkaline Phos 25 - 125 U/L - - -  AST 13 - 35 U/L - - -  ALT 7 - 35 U/L - - -   Liver Function Tests: No results for input(s): AST, ALT, ALKPHOS, BILITOT, PROT, ALBUMIN in the last 8760 hours.   Radiological Exams: Dg Chest 2 View  07/11/2015   CLINICAL DATA:  Fall from bed.  Right hip pain.  EXAM: CHEST  2 VIEW  COMPARISON:  05/29/2015  FINDINGS: The heart size and mediastinal contours are within normal limits. Both lungs are clear. The bones appear diffusely osteopenic. There is no acute fracture or subluxation identified.  IMPRESSION: No active cardiopulmonary disease.   Electronically Signed   By: Kerby Moors M.D.   On: 07/11/2015 17:52   Ct Head Wo Contrast  07/11/2015   CLINICAL DATA:  Pt is from home, fell out of bed, right hip pain today, left sided temporal abrasion. Denies LOC, no blood thinners. No obvious  deformity. Pt usually uses a walker at home, is unsteady on her feet. Pt also had a fall on Saturday  EXAM: CT HEAD WITHOUT CONTRAST  CT CERVICAL SPINE WITHOUT CONTRAST  TECHNIQUE: Multidetector CT imaging of the head and cervical spine was performed following the standard protocol without intravenous contrast. Multiplanar CT image reconstructions of the cervical spine were also generated.  COMPARISON:  05/25/2015  FINDINGS: CT HEAD FINDINGS  Partial opacification of right mastoid air cells as before. Atherosclerotic and physiologic intracranial calcifications. Diffuse parenchymal atrophy. Prominence of lateral ventricles as before. Patchy areas of hypoattenuation in deep and periventricular white matter bilaterally. Negative for acute intracranial hemorrhage, mass lesion, acute infarction, midline shift, or mass-effect. Acute infarct may be inapparent on noncontrast CT. Ventricles and sulci symmetric. Bone windows demonstrate no focal lesion.  CT CERVICAL SPINE FINDINGS  Normal alignment. No prevertebral soft tissue swelling. Facets are seated. Mild narrowing of the C2-3 and C5-6 interspaces. Fairly exuberant degenerative change at the atlantoaxial articulation. Anterior and posterior endplate spurring J0-K9. Negative for fracture. Bilateral carotid calcified plaque. Scarring in the visualized lung apices. Calcification in the apical segment left upper lobe, incompletely visualized.  IMPRESSION: 1. Negative for bleed or other acute intracranial process. 2. Atrophy and nonspecific white matter changes as before. 3. Negative for cervical fracture or other acute bone abnormality. 4. Multilevel cervical degenerative changes as above.   Electronically Signed   By: Lucrezia Europe M.D.   On: 07/11/2015 18:01   Ct Cervical Spine Wo Contrast  07/11/2015   CLINICAL DATA:  Pt is from home, fell out of bed, right hip pain today, left sided temporal abrasion. Denies LOC, no blood thinners. No obvious deformity. Pt usually uses a  walker at home, is unsteady on her feet. Pt also had a  fall on Saturday  EXAM: CT HEAD WITHOUT CONTRAST  CT CERVICAL SPINE WITHOUT CONTRAST  TECHNIQUE: Multidetector CT imaging of the head and cervical spine was performed following the standard protocol without intravenous contrast. Multiplanar CT image reconstructions of the cervical spine were also generated.  COMPARISON:  05/25/2015  FINDINGS: CT HEAD FINDINGS  Partial opacification of right mastoid air cells as before. Atherosclerotic and physiologic intracranial calcifications. Diffuse parenchymal atrophy. Prominence of lateral ventricles as before. Patchy areas of hypoattenuation in deep and periventricular white matter bilaterally. Negative for acute intracranial hemorrhage, mass lesion, acute infarction, midline shift, or mass-effect. Acute infarct may be inapparent on noncontrast CT. Ventricles and sulci symmetric. Bone windows demonstrate no focal lesion.  CT CERVICAL SPINE FINDINGS  Normal alignment. No prevertebral soft tissue swelling. Facets are seated. Mild narrowing of the C2-3 and C5-6 interspaces. Fairly exuberant degenerative change at the atlantoaxial articulation. Anterior and posterior endplate spurring B8-G6. Negative for fracture. Bilateral carotid calcified plaque. Scarring in the visualized lung apices. Calcification in the apical segment left upper lobe, incompletely visualized.  IMPRESSION: 1. Negative for bleed or other acute intracranial process. 2. Atrophy and nonspecific white matter changes as before. 3. Negative for cervical fracture or other acute bone abnormality. 4. Multilevel cervical degenerative changes as above.   Electronically Signed   By: Lucrezia Europe M.D.   On: 07/11/2015 18:01   Dg Hip Unilat With Pelvis 2-3 Views Right  07/11/2015   CLINICAL DATA:  Right hip pain  EXAM: DG HIP (WITH OR WITHOUT PELVIS) 2-3V RIGHT  COMPARISON:  05/26/2015  FINDINGS: There is foreshortening of the right femoral neck which is suspicious  for a subcapital femoral neck fracture. There is advanced osteoarthritis involving the right hip joint with near bone-on-bone contact. Marginal spur formation is noted. No dislocation.  IMPRESSION: 1. Findings are worrisome for impacted subcapital femoral neck fracture involving the right hip. Recommend confirmatory imaging with right hip CT or MRI. 2. Osteopenia. 3. Degenerative joint disease.   Electronically Signed   By: Kerby Moors M.D.   On: 07/11/2015 17:49     Assessment/Plan  Dysphagia Will have speech therapy to evaluate. Aspiration precautions to be taken. Continue omeprazole 20 mg daily for now to help with her reflux symptoms. Recent CXR is negative for infiltrates.  Cough Add mucinex dm 600 mg bid, chest xray is negative for pneumonia, aspiration precautions to be taken.  Severe osteoarthritis Off in place. Continue Ultram 50 mg 1-2 tablet every 6 hours as needed for pain  Chronic diastolic heart failure Continue metoprolol tartrate 50 twice a day, valsartan 320 mg daily and monitor.  Neuropathic pain Currently on pregabalin 50 mg tid and this has been helpful with her symptoms. Monitor. No changes.  Hypertension Monitor blood pressure reading. Continue Norvasc, Diovan and metoprolol tartrate. Fall precautions to be taken. Monitor BMP periodically. Continue aspirin.  CAD S/p bare metal stent in 2012. Remains chest pain free. Continue aspirin and metoprolol. Continue prn NTG. Off statin with her age and myalgias with statin.     Family/ staff Communication: reviewed care plan with patient and nursing supervisor    Blanchie Serve, MD Internal Medicine [place], [place] [address] Cell Phone (Monday-Friday 8 am - 5 pm): [phone] On Call: [phone] and follow prompts after 5 pm and on weekends Office Phone: [phone] Office Fax: [phone]

## 2017-01-23 LAB — BASIC METABOLIC PANEL
BUN: 21 mg/dL (ref 4–21)
Creatinine: 1 mg/dL (ref 0.5–1.1)
Glucose: 87 mg/dL
Potassium: 4.9 mmol/L (ref 3.4–5.3)
SODIUM: 137 mmol/L (ref 137–147)

## 2017-01-23 LAB — CBC AND DIFFERENTIAL
HEMATOCRIT: 32 % — AB (ref 36–46)
HEMOGLOBIN: 8.9 g/dL — AB (ref 12.0–16.0)
PLATELETS: 472 10*3/uL — AB (ref 150–399)
WBC: 9.2 10^3/mL

## 2017-01-23 LAB — LIPID PANEL
CHOLESTEROL: 166 mg/dL (ref 0–200)
HDL: 67 mg/dL (ref 35–70)
LDL CALC: 84 mg/dL
TRIGLYCERIDES: 75 mg/dL (ref 40–160)

## 2017-01-29 ENCOUNTER — Encounter: Payer: Self-pay | Admitting: Family

## 2017-01-29 ENCOUNTER — Non-Acute Institutional Stay (SKILLED_NURSING_FACILITY): Payer: Medicare Other | Admitting: Family

## 2017-01-29 DIAGNOSIS — I110 Hypertensive heart disease with heart failure: Secondary | ICD-10-CM

## 2017-01-29 DIAGNOSIS — K219 Gastro-esophageal reflux disease without esophagitis: Secondary | ICD-10-CM

## 2017-01-29 DIAGNOSIS — J189 Pneumonia, unspecified organism: Secondary | ICD-10-CM

## 2017-01-29 DIAGNOSIS — K5901 Slow transit constipation: Secondary | ICD-10-CM

## 2017-01-29 DIAGNOSIS — J181 Lobar pneumonia, unspecified organism: Secondary | ICD-10-CM | POA: Diagnosis not present

## 2017-01-30 NOTE — Progress Notes (Signed)
Location:  [place] Room Number: 906-A Place of Service:  SNF (31) Provider: Dinah Ngetich FNP-C   Blanchie Serve, MD  Patient Care Team: Blanchie Serve, MD as PCP - General (Internal Medicine)  Extended Emergency Contact Information Primary Emergency Contact: Jari Pigg of [place] Phone: [phone] Work Phone: [phone] Relation: Friend Secondary Emergency Contact: [place] of [place] Phone: [phone] Relation: Friend  Code Status:  DNR  Goals of care: Advanced Directive information Advanced Directives 10/10/2016  Does Patient Have a Medical Advance Directive? Yes  Type of Advance Directive Out of facility DNR (pink MOST or yellow form)  Does patient want to make changes to medical advance directive? -  Copy of [place] in Chart? -  Would patient like information on creating a medical advance directive? -  Pre-existing out of facility DNR order (yellow form or pink MOST form) Yellow form placed in chart (order not valid for inpatient use)     Chief Complaint  Patient presents with  . Medical Management of Chronic Issues    1 month routine follow-up   . Acute Concerns    Patient with acute concerns also    HPI:  Pt is a 83 y.o. female seen today [facility] for medical management of chronic diseases.she has a medical history of HTN, CHF, CAD, GERD, Anxiety, Neuropathic pain among other conditions. She is seen in her room today. She states not feeling well this visit. Facility Nurse reports patient has had a loose nonproductive cough. She denies any fever, chills or shortness of breath. she has had no recent weight changes or fall episodes since prior visit. She continues to enjoy participating in facility activities.      Past Medical History:  Diagnosis Date  . ALLERGIC RHINITIS 04/13/2007  . Arthritis   . CAD (coronary artery  disease)    NSTEMI.  LHC 09/03/11: pLAD 40-50%, pDx 80-90% (very small), lateral branch of OM 99%, m-dCFX 40%, oRCA 80%, m-dRCA 50-70%, EF 55%.  She had PCI with BMS to lateral branch of the OM.  RCA was tx medically.   . Cancer ([place])   . CARCINOID TUMOR 04/13/2007  . CHOLELITHIASIS 05/02/2010  . Dermatophytosis of nail   . Diphtheria    age 21  . Environmental allergies    has mucus in throat  . GERD 04/13/2007  . HYPERGLYCEMIA 04/13/2007  . HYPERTENSION 04/13/2007  . Hyponatremia   . Murmur    echo 11/12: EF 55-60%, mod calcified AV annulus, mild AI, mild MR  . UTI (urinary tract infection) 03/2015   Past Surgical History:  Procedure Laterality Date  . CARDIAC SURGERY    . COLON SURGERY     CANCER  . LEFT HEART CATHETERIZATION WITH CORONARY ANGIOGRAM N/A 09/03/2011   Procedure: LEFT HEART CATHETERIZATION WITH CORONARY ANGIOGRAM;  Surgeon: Peter M Martinique, MD;  Location: [facility] CATH LAB;  Service: Cardiovascular;  Laterality: N/A;  . PERCUTANEOUS CORONARY STENT INTERVENTION (PCI-S)  09/03/2011   Procedure: PERCUTANEOUS CORONARY STENT INTERVENTION (PCI-S);  Surgeon: Peter M Martinique, MD;  Location: [facility] CATH LAB;  Service: Cardiovascular;;  . SMALL BOWEL REPAIR    . TONSILLECTOMY     many years ago    Allergies  Allergen Reactions  . Ciprofloxacin     emesis  . Paxil [Paroxetine Hcl] Nausea And Vomiting    Allergies as of 01/29/2017      Reactions   Ciprofloxacin  emesis   Paxil [paroxetine Hcl] Nausea And Vomiting      Medication List       Accurate as of 01/29/17 11:59 PM. Always use your most recent med list.          acetaminophen 500 MG tablet Commonly known as:  TYLENOL Take 1,000 mg by mouth 2 (two) times daily.   amLODipine 10 MG tablet Commonly known as:  NORVASC Take 10 mg by mouth daily. For HTN   aspirin 81 MG tablet Take 81 mg by mouth daily.   bisacodyl 10 MG suppository Commonly known as:  DULCOLAX Place 1 suppository (10 mg total) rectally daily as  needed for moderate constipation.   calcium-vitamin D 500-200 MG-UNIT tablet Commonly known as:  OSCAL WITH D Take 1 tablet by mouth 2 (two) times daily.   ipratropium-albuterol 0.5-2.5 (3) MG/3ML Soln Commonly known as:  DUONEB Take 3 mLs by nebulization every 6 (six) hours as needed.   loperamide 2 MG capsule Commonly known as:  IMODIUM Take 4 mg by mouth as needed for diarrhea or loose stools.   metoprolol 50 MG tablet Commonly known as:  LOPRESSOR Take 1 tablet (50 mg total) by mouth 2 (two) times daily.   MULTI VITAMIN DAILY Tabs Take 1 tablet by mouth daily. Pt. Not sure of dose.   nitroGLYCERIN 0.4 MG SL tablet Commonly known as:  NITROSTAT Place 1 tablet (0.4 mg total) under the tongue every 5 (five) minutes as needed for chest pain.   omeprazole 20 MG capsule Commonly known as:  PRILOSEC Take 20 mg by mouth daily. May sprinkle over food.   pregabalin 50 MG capsule Commonly known as:  LYRICA Take one capsule by mouth three times daily for neuropathy   sennosides-docusate sodium 8.6-50 MG tablet Commonly known as:  SENOKOT-S Take 1 tablet by mouth 2 (two) times daily.   sodium chloride 1 g tablet Take 1 g by mouth 2 (two) times daily with a meal.   SYSTANE BALANCE 0.6 % Soln Generic drug:  Propylene Glycol Apply 1 drop to eye 2 (two) times daily. Both eyes for dry eye   traMADol 50 MG tablet Commonly known as:  ULTRAM 1 tab (50 mg)  by mouth for pain 2-5/10 every 6 hours , 2 tabs =  (100 mg) by mouth for pain 6-10/10 every 6 hours as needed   valsartan 320 MG tablet Commonly known as:  DIOVAN Take 1 tablet (320 mg total) by mouth daily.       Review of Systems  Constitutional: Negative for activity change, appetite change, chills, fatigue and fever.  HENT: Negative for congestion, rhinorrhea, sinus pressure, sneezing and sore throat.   Eyes: Negative.   Respiratory: Negative for cough, chest tightness, shortness of breath and wheezing.     Cardiovascular: Negative for chest pain, palpitations and leg swelling.  Gastrointestinal: Negative for abdominal distention, abdominal pain, constipation, diarrhea, nausea and vomiting.  Endocrine: Negative for cold intolerance, heat intolerance, polydipsia, polyphagia and polyuria.  Genitourinary: Negative for dysuria, flank pain, frequency and urgency.  Musculoskeletal: Positive for gait problem. Negative for back pain.  Skin: Negative for color change, pallor and rash.  Neurological: Negative for dizziness, seizures, weakness, light-headedness and headaches.  Hematological: Does not bruise/bleed easily.  Psychiatric/Behavioral: Negative for agitation, confusion, hallucinations and sleep disturbance. The patient is not nervous/anxious.     Immunization History  Administered Date(s) Administered  . Influenza Split 07/15/2011, 07/02/2012  . Influenza Whole 08/19/2007, 07/29/2008  . Influenza,inj,Quad PF,36+  Mos 10/12/2013  . Influenza-Unspecified 08/10/2015  . PPD Test 07/15/2015, 07/29/2015  . Pneumococcal Conjugate-13 12/27/2014  . Pneumococcal Polysaccharide-23 08/22/2003  . Pneumococcal-Unspecified 08/27/2015  . Tdap 05/30/2011   Pertinent  Health Maintenance Due  Topic Date Due  . DEXA SCAN  12/26/2017 (Originally 12/22/1986)  . INFLUENZA VACCINE  05/21/2017  . PNA vac Low Risk Adult  Completed   Fall Risk  12/27/2014  Falls in the past year? No    Vitals:   01/29/17 1514  BP: 121/78  Pulse: 68  Resp: 16  Temp: 97.4 F (36.3 C)  TempSrc: Oral  SpO2: 96%  Weight: 132 lb (59.9 kg)  Height: 5\' 7"  (1.702 m)   Body mass index is 20.67 kg/m. Physical Exam  Constitutional: She appears well-developed and well-nourished. No distress.  HENT:  Head: Normocephalic.  Mouth/Throat: Oropharynx is clear and moist. No oropharyngeal exudate.  Eyes: Conjunctivae and EOM are normal. Pupils are equal, round, and reactive to light. Right eye exhibits no discharge. Left eye exhibits  no discharge. No scleral icterus.  Neck: Normal range of motion. No JVD present. No thyromegaly present.  Cardiovascular: Normal rate, regular rhythm, normal heart sounds and intact distal pulses.  Exam reveals no gallop and no friction rub.   No murmur heard. Pulmonary/Chest: Effort normal. No respiratory distress. She has no wheezes.  Left lower crackles   Abdominal: Soft. Bowel sounds are normal. She exhibits no distension and no mass. There is no rebound and no guarding.  Genitourinary:  Genitourinary Comments: Incontinent  Musculoskeletal: She exhibits no edema, tenderness or deformity.  Moves x 4 extremities.unsteady gait.  Lymphadenopathy:    She has no cervical adenopathy.  Neurological: She is alert.  Skin: Skin is warm and dry. No rash noted. No erythema. No pallor.  No skin breakdown   Psychiatric: She has a normal mood and affect.    Labs reviewed:  Recent Labs  06/06/16 07/25/16 01/23/17  NA 136* 131* 137  K 5.0 4.7 4.9  BUN 23* 21 21  CREATININE 0.9 0.8 1.0    Recent Labs  03/21/16 07/25/16 01/23/17  WBC 7.2 8.3 9.2  HGB 11.8* 8.5* 8.9*  HCT 37 29* 32*  PLT 587* 421* 472*   Lab Results  Component Value Date   TSH 4.34 07/25/2016   Lab Results  Component Value Date   HGBA1C 6.0 12/02/2008   Lab Results  Component Value Date   CHOL 166 01/23/2017   HDL 67 01/23/2017   LDLCALC 84 01/23/2017   LDLDIRECT 141.6 12/02/2008   TRIG 75 01/23/2017   CHOLHDL 2 10/05/2014   Assessment/Plan 1. Community acquired pneumonia of left lower lobe of lung  Afebrile. Portable CXR Pa/Lat ordered results showed left lower lobe pneumonia. Will start on doxycycline 100 mg tablet one twice daily x 10 days. Florastor 250 mg Capsule one by mouth twice daily x 10 days.   2. Hypertensive heart disease with heart failure B/p stable.continue on Valsartan, Metoprolol and amlodipine. Monitor BMP  3. Gastroesophageal reflux disease without esophagitis Continue on omeprazole  daily. Will reevaluate needed or switch to H2 blocker.   4. Slow transit constipation Current bowel regimen effective. Continue to encourage oral and fluid intake. Continue to monitor.   Family/ staff Communication: Reviewed plan of care with patient and facility Nurse supervisor   Labs/tests ordered: None   Sandrea Hughs, NP

## 2017-02-06 LAB — HM DIABETES FOOT EXAM

## 2017-02-25 ENCOUNTER — Encounter: Payer: Self-pay | Admitting: Family

## 2017-02-25 ENCOUNTER — Non-Acute Institutional Stay (SKILLED_NURSING_FACILITY): Payer: Medicare Other | Admitting: Family

## 2017-02-25 DIAGNOSIS — K219 Gastro-esophageal reflux disease without esophagitis: Secondary | ICD-10-CM

## 2017-02-25 DIAGNOSIS — I110 Hypertensive heart disease with heart failure: Secondary | ICD-10-CM

## 2017-02-25 DIAGNOSIS — R0781 Pleurodynia: Secondary | ICD-10-CM

## 2017-02-25 DIAGNOSIS — I5032 Chronic diastolic (congestive) heart failure: Secondary | ICD-10-CM | POA: Diagnosis not present

## 2017-02-26 NOTE — Progress Notes (Signed)
Location:  [place] Room Number: [place] of Service:  SNF (31) Provider: Dinah Ngetich FNP-C   Blanchie Serve, MD  Patient Care Team: Blanchie Serve, MD as PCP - General (Internal Medicine)  Extended Emergency Contact Information Primary Emergency Contact: Jari Pigg of [place] Phone: [phone] Work Phone: [phone] Relation: Friend Secondary Emergency Contact: [place] of [place] Phone: [phone] Relation: Friend  Code Status:  DNR  Goals of care: Advanced Directive information Advanced Directives 02/25/2017  Does Patient Have a Medical Advance Directive? Yes  Type of Advance Directive Out of facility DNR (pink MOST or yellow form)  Does patient want to make changes to medical advance directive? -  Copy of [place] in Chart? -  Would patient like information on creating a medical advance directive? -  Pre-existing out of facility DNR order (yellow form or pink MOST form) Yellow form placed in chart (order not valid for inpatient use)     Chief Complaint  Patient presents with  . Medical Management of Chronic Issues    Routine Visit    HPI:  Pt is a 83 y.o. female seen today [address] for medical management of chronic diseases.she has a medical history of HTN, CHF, CAD, GERD, Anxiety, Neuropathic pain among other conditions. She is seen in her room today. She complains of right side pain worse with movement and deep breathing. She describes pain as " catchy". Pain started about four days ago.she has taken pain medication with much improvement. She denies any fever,chills, cough or shortness of breath. No recent weight changes or fall episode since prior visit.     Past Medical History:  Diagnosis Date  . ALLERGIC RHINITIS 04/13/2007  . Arthritis   . CAD (coronary artery disease)    NSTEMI.  LHC 09/03/11: pLAD 40-50%, pDx 80-90%  (very small), lateral branch of OM 99%, m-dCFX 40%, oRCA 80%, m-dRCA 50-70%, EF 55%.  She had PCI with BMS to lateral branch of the OM.  RCA was tx medically.   . Cancer ([place])   . CARCINOID TUMOR 04/13/2007  . CHOLELITHIASIS 05/02/2010  . Dermatophytosis of nail   . Diphtheria    age 54  . Environmental allergies    has mucus in throat  . GERD 04/13/2007  . HYPERGLYCEMIA 04/13/2007  . HYPERTENSION 04/13/2007  . Hyponatremia   . Murmur    echo 11/12: EF 55-60%, mod calcified AV annulus, mild AI, mild MR  . UTI (urinary tract infection) 03/2015   Past Surgical History:  Procedure Laterality Date  . CARDIAC SURGERY    . COLON SURGERY     CANCER  . LEFT HEART CATHETERIZATION WITH CORONARY ANGIOGRAM N/A 09/03/2011   Procedure: LEFT HEART CATHETERIZATION WITH CORONARY ANGIOGRAM;  Surgeon: Peter M Martinique, MD;  Location: [facility] CATH LAB;  Service: Cardiovascular;  Laterality: N/A;  . PERCUTANEOUS CORONARY STENT INTERVENTION (PCI-S)  09/03/2011   Procedure: PERCUTANEOUS CORONARY STENT INTERVENTION (PCI-S);  Surgeon: Peter M Martinique, MD;  Location: [facility] CATH LAB;  Service: Cardiovascular;;  . SMALL BOWEL REPAIR    . TONSILLECTOMY     many years ago    Allergies  Allergen Reactions  . Ciprofloxacin     emesis  . Paxil [Paroxetine Hcl] Nausea And Vomiting    Allergies as of 02/25/2017      Reactions   Ciprofloxacin    emesis   Paxil [paroxetine Hcl] Nausea And Vomiting  Medication List       Accurate as of 02/25/17 11:59 PM. Always use your most recent med list.          acetaminophen 500 MG tablet Commonly known as:  TYLENOL Take 1,000 mg by mouth 2 (two) times daily.   amLODipine 10 MG tablet Commonly known as:  NORVASC Take 10 mg by mouth daily. For HTN   aspirin 81 MG tablet Take 81 mg by mouth daily.   bisacodyl 10 MG suppository Commonly known as:  DULCOLAX Place 1 suppository (10 mg total) rectally daily as needed for moderate constipation.   calcium-vitamin D 500-200  MG-UNIT tablet Commonly known as:  OSCAL WITH D Take 1 tablet by mouth 2 (two) times daily.   ipratropium-albuterol 0.5-2.5 (3) MG/3ML Soln Commonly known as:  DUONEB Take 3 mLs by nebulization every 6 (six) hours as needed.   loperamide 2 MG capsule Commonly known as:  IMODIUM Take 4 mg by mouth as needed for diarrhea or loose stools.   metoprolol 50 MG tablet Commonly known as:  LOPRESSOR Take 1 tablet (50 mg total) by mouth 2 (two) times daily.   MULTI VITAMIN DAILY Tabs Take 1 tablet by mouth daily. Pt. Not sure of dose.   nitroGLYCERIN 0.4 MG SL tablet Commonly known as:  NITROSTAT Place 1 tablet (0.4 mg total) under the tongue every 5 (five) minutes as needed for chest pain.   omeprazole 20 MG capsule Commonly known as:  PRILOSEC Take 20 mg by mouth daily. May sprinkle over food.   pregabalin 50 MG capsule Commonly known as:  LYRICA Take one capsule by mouth three times daily for neuropathy   sennosides-docusate sodium 8.6-50 MG tablet Commonly known as:  SENOKOT-S Take 1 tablet by mouth 2 (two) times daily.   sodium chloride 1 g tablet Take 1 g by mouth 2 (two) times daily with a meal.   SYSTANE BALANCE 0.6 % Soln Generic drug:  Propylene Glycol Apply 1 drop to eye 2 (two) times daily. Both eyes for dry eye   traMADol 50 MG tablet Commonly known as:  ULTRAM 1 tab (50 mg)  by mouth for pain 2-5/10 every 6 hours , 2 tabs =  (100 mg) by mouth for pain 6-10/10 every 6 hours as needed   UNABLE TO FIND Take 60 mLs by mouth 2 (two) times daily. Med Name: MedPass 2.0 (Document % consumed)   valsartan 320 MG tablet Commonly known as:  DIOVAN Take 1 tablet (320 mg total) by mouth daily.       Review of Systems  Constitutional: Negative for activity change, appetite change, chills, fatigue and fever.  HENT: Negative for congestion, rhinorrhea, sinus pressure, sneezing and sore throat.   Eyes: Negative.   Respiratory: Negative for cough, chest tightness,  shortness of breath and wheezing.        Right side pain per HPI   Cardiovascular: Negative for chest pain, palpitations and leg swelling.  Gastrointestinal: Negative for abdominal distention, abdominal pain, constipation, diarrhea, nausea and vomiting.  Endocrine: Negative for cold intolerance, heat intolerance, polydipsia, polyphagia and polyuria.  Genitourinary: Negative for dysuria, flank pain, frequency and urgency.  Musculoskeletal: Positive for gait problem. Negative for back pain.  Skin: Negative for color change, pallor and rash.  Neurological: Negative for dizziness, seizures, weakness, light-headedness and headaches.  Hematological: Does not bruise/bleed easily.  Psychiatric/Behavioral: Negative for agitation, confusion, hallucinations and sleep disturbance. The patient is not nervous/anxious.     Immunization History  Administered  Date(s) Administered  . Influenza Split 07/15/2011, 07/02/2012  . Influenza Whole 08/19/2007, 07/29/2008  . Influenza,inj,Quad PF,36+ Mos 10/12/2013  . Influenza-Unspecified 08/10/2015  . PPD Test 07/15/2015, 07/29/2015  . Pneumococcal Conjugate-13 12/27/2014  . Pneumococcal Polysaccharide-23 08/22/2003  . Pneumococcal-Unspecified 08/27/2015  . Tdap 05/30/2011   Pertinent  Health Maintenance Due  Topic Date Due  . DEXA SCAN  12/26/2017 (Originally 12/[birth year]/1986)  . INFLUENZA VACCINE  05/21/2017  . PNA vac Low Risk Adult  Completed   Fall Risk  12/27/2014  Falls in the past year? No    Vitals:   02/25/17 1027  BP: (!) 139/52  Pulse: 63  Resp: 16  Temp: (!) 95.9 F (35.5 C)  SpO2: 97%  Weight: 130 lb 12.8 oz (59.3 kg)  Height: 5\' 7"  (1.702 m)   Body mass index is 20.49 kg/m. Physical Exam  Constitutional: She appears well-developed and well-nourished. No distress.  HENT:  Head: Normocephalic.  Mouth/Throat: Oropharynx is clear and moist. No oropharyngeal exudate.  Eyes: Conjunctivae and EOM are normal. Pupils are equal, round, and  reactive to light. Right eye exhibits no discharge. Left eye exhibits no discharge. No scleral icterus.  Neck: Normal range of motion. No JVD present. No thyromegaly present.  Cardiovascular: Normal rate, regular rhythm, normal heart sounds and intact distal pulses.  Exam reveals no gallop and no friction rub.   No murmur heard. Pulmonary/Chest: Effort normal. No respiratory distress. She has no wheezes.  Right upper lobe rales noted   Abdominal: Soft. Bowel sounds are normal. She exhibits no distension and no mass. There is no rebound and no guarding.  Genitourinary:  Genitourinary Comments: Incontinent  Musculoskeletal: She exhibits no edema, tenderness or deformity.  Moves x 4 extremities self propels on wheelchair   Lymphadenopathy:    She has no cervical adenopathy.  Neurological: She is alert.  Skin: Skin is warm and dry. No rash noted. No erythema. No pallor.  Psychiatric: She has a normal mood and affect.    Labs reviewed:  Recent Labs  06/06/16 07/25/16 01/23/17  NA 136* 131* 137  K 5.0 4.7 4.9  BUN 23* 21 21  CREATININE 0.9 0.8 1.0    Recent Labs  03/21/16 07/25/16 01/23/17  WBC 7.2 8.3 9.2  HGB 11.8* 8.5* 8.9*  HCT 37 29* 32*  PLT 587* 421* 472*   Lab Results  Component Value Date   TSH 4.34 07/25/2016   Lab Results  Component Value Date   HGBA1C 6.0 12/02/2008   Lab Results  Component Value Date   CHOL 166 01/23/2017   HDL 67 01/23/2017   LDLCALC 84 01/23/2017   LDLDIRECT 141.6 12/02/2008   TRIG 75 01/23/2017   CHOLHDL 2 10/05/2014   Assessment/Plan 1. Right side pain  Pain worse with movement or deep breathing. No recent injuries to site. Non tender to touch.Suspect possible PNA.  Portable chest X-ray to rule out pneumonia already ordered awaiting results.    2. Gastroesophageal reflux disease without esophagitis Stable. Will switch to H2 blocker discontinue omeprazole daily then start Zantac 150 mg Tablet twice daily.   3.CHF  Wt stable.no  edema or shortness of breath noted except right lobe rales.Continue on metoprolol. Continue on fluid restrictions. Daily weight.    4. Hypertensive heart disease with heart failure B/p stable.continue on Valsartan, Metoprolol and amlodipine. Monitor BMP  Family/ staff Communication: Reviewed plan of care with patient and facility Nurse supervisor   Labs/tests ordered: None   Nelda Bucks Ngetich, NP

## 2017-08-02 IMAGING — CT CT HIP*R* W/O CM
2 of 3 series · 16 of 46 positions shown, 18 images · non-contrast
Comparison: 07/02/2006.

CLINICAL DATA: History of carcinoid tumor. Evaluate for acute
versus chronic right hip fracture. Right hip pain.

EXAM:
CT OF THE RIGHT HIP WITHOUT CONTRAST
TECHNIQUE: Multidetector CT imaging of the right hip was performed according to
the standard protocol. Multiplanar CT image reconstructions were
also generated.

[Series 3: pelvis 5.0 i31f 1 · axial · 0.45mm/px · z∈[-382,-182]mm · 13 of 48 slices shown, 15 images]
[im 4/48  soft-tissue]
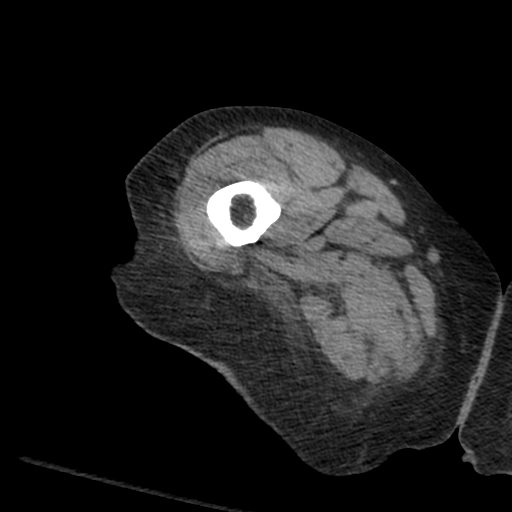
[im 4/48  bone]
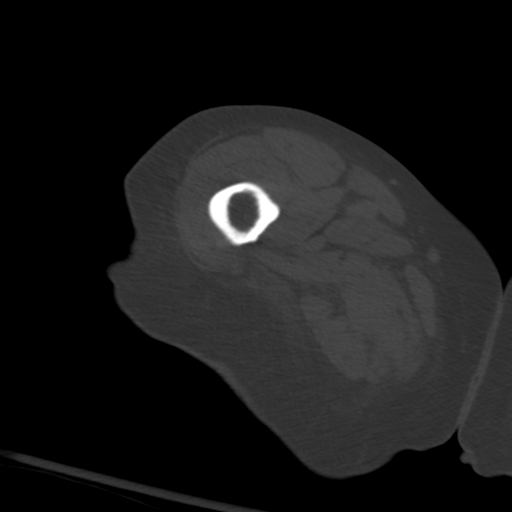
[im 7/48  soft-tissue]
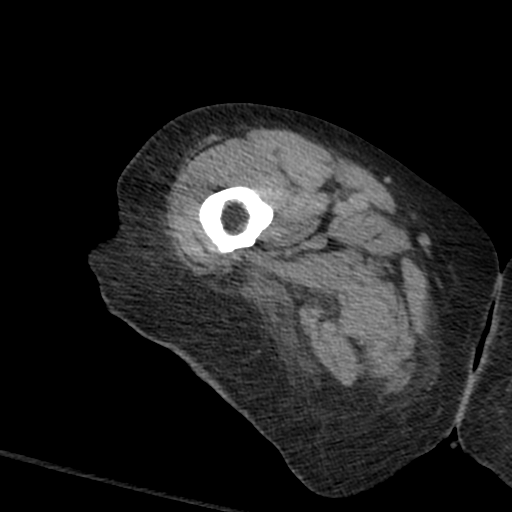
[im 10/48  soft-tissue]
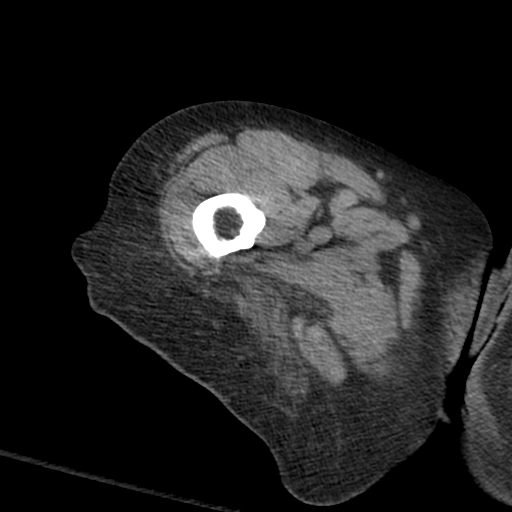
[im 14/48  soft-tissue]
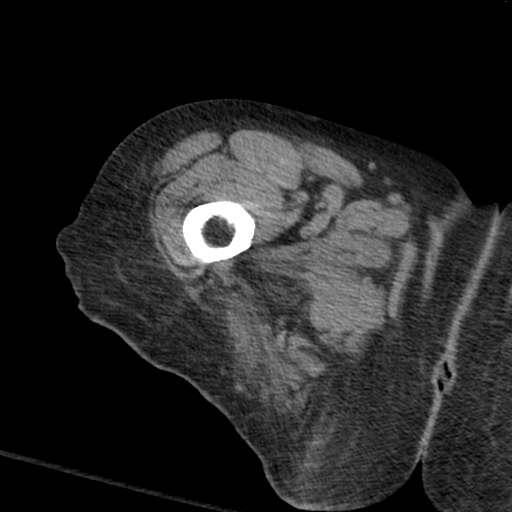
[im 17/48  soft-tissue]
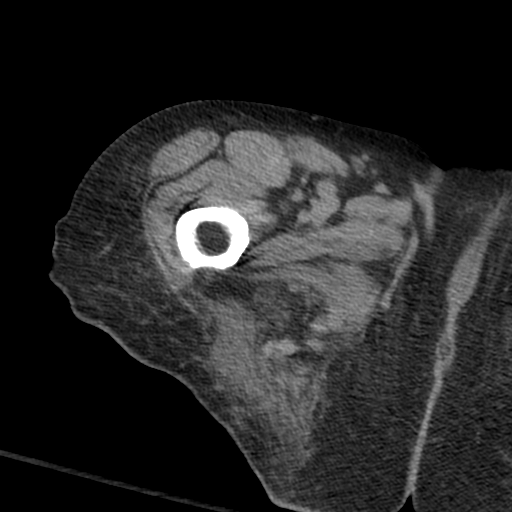
[im 20/48  soft-tissue]
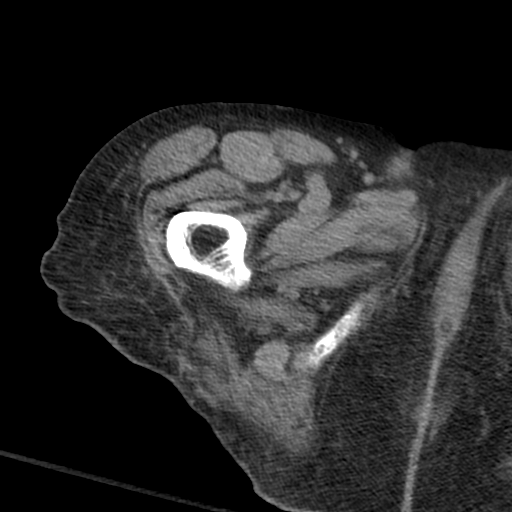
[im 25/48  soft-tissue]
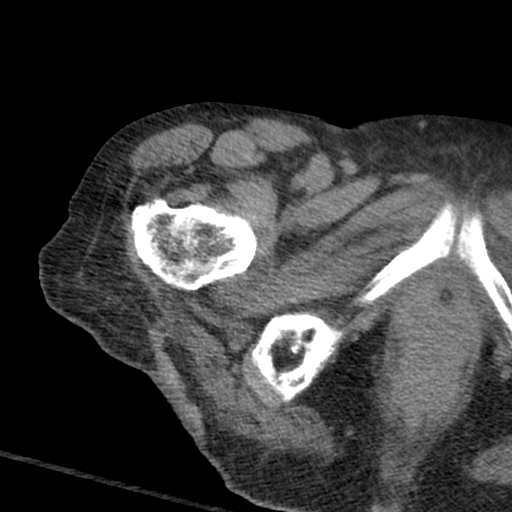
[im 28/48  soft-tissue]
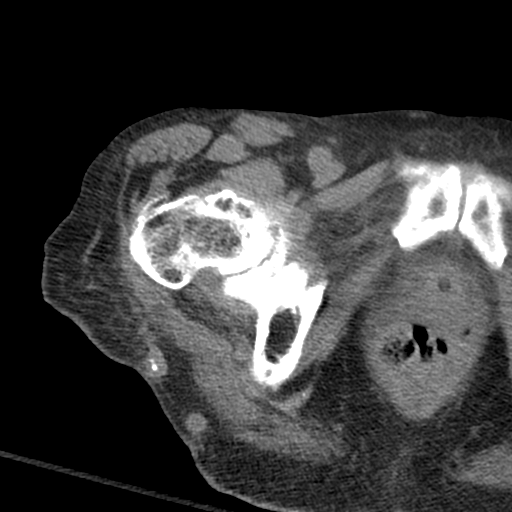
[im 31/48  soft-tissue]
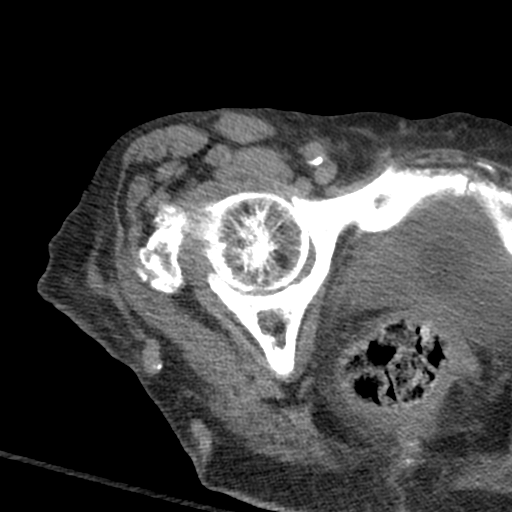
[im 31/48  bone]
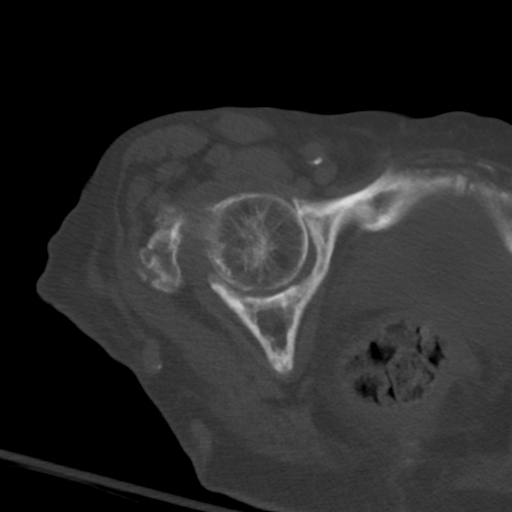
[im 34/48  soft-tissue]
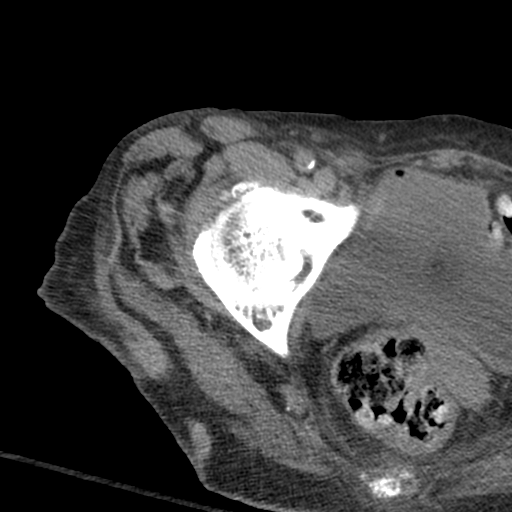
[im 38/48  soft-tissue]
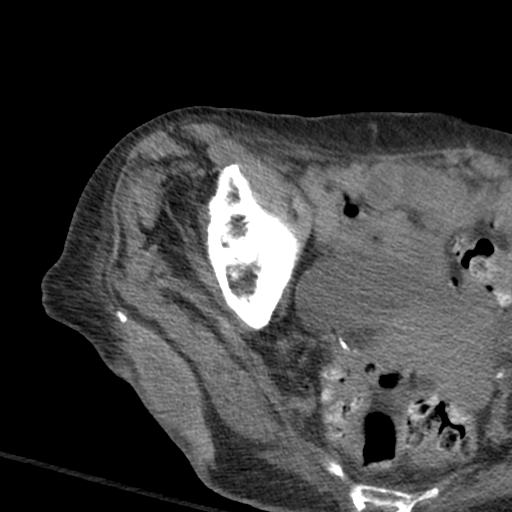
[im 41/48  soft-tissue]
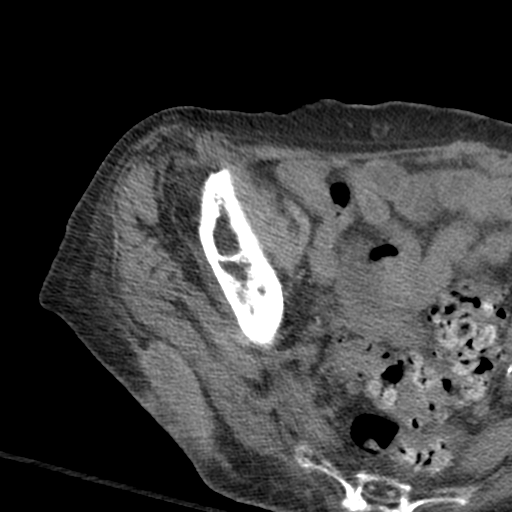
[im 44/48  soft-tissue]
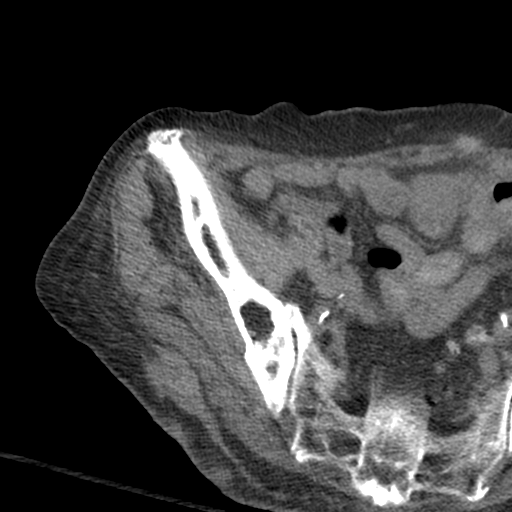

[Series 6: coronal · coronal · 0.41mm/px · 3 of 61 slices shown]
[im 21/61  soft-tissue]
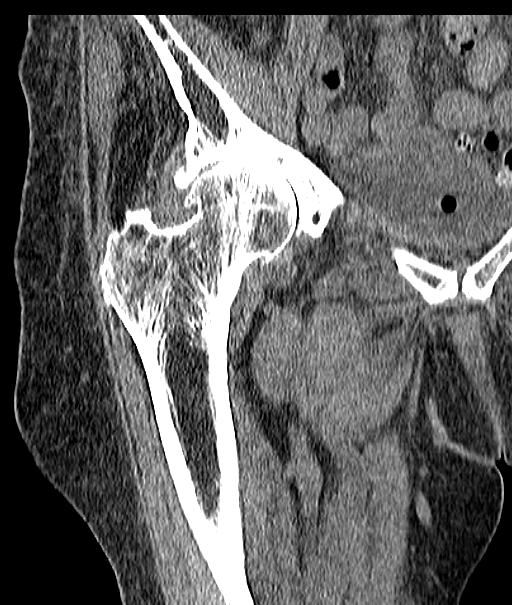
[im 27/61  soft-tissue]
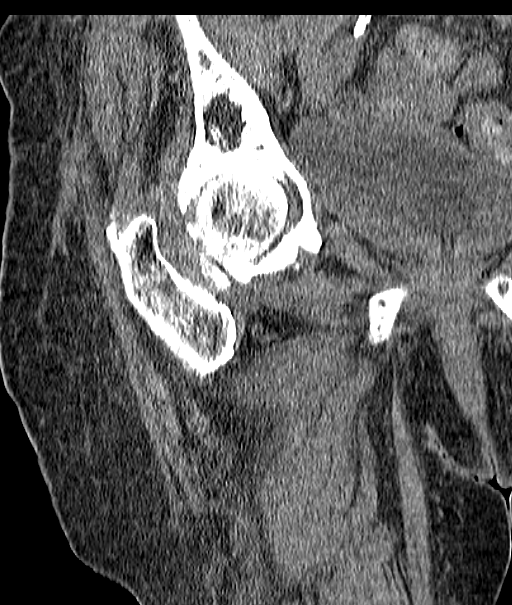
[im 34/61  soft-tissue]
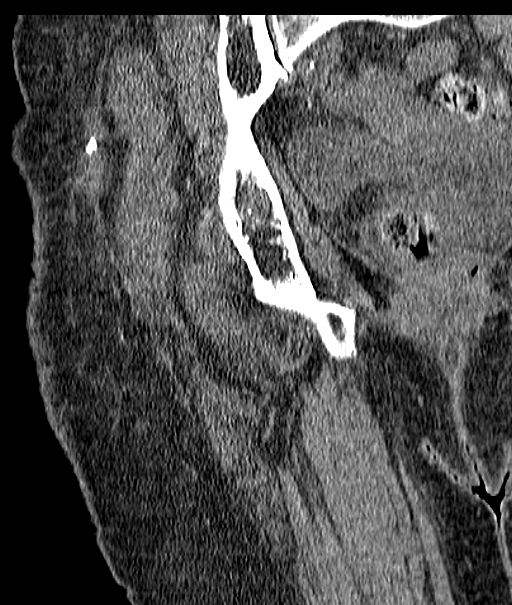

[16 of 46 positions shown; findings below may reference images not displayed]

FINDINGS: There is generalized osteopenia. There is no acute fracture or
dislocation. There is severe osteoarthritis of the right hip with
severe joint space narrowing with a bone-on-bone appearance and
marginal osteophytosis. There are mild degenerative changes of the
right SI joint.

There is trabecular thickening involving the right hemipelvis with
mild bony expansion most consistent with Paget's disease.

There is a chronic 7.2 x 2.6 cm complex fluid collection in the
subcutaneous fat overlying the right gluteus maximus muscle
unchanged compared with 07/02/2006. There is no other fluid
collection. The muscles are normal. There is no inguinal
lymphadenopathy. There is no inguinal hernia. There is
diverticulosis without evidence diverticulitis of the visualized
sigmoid colon.
IMPRESSION: 1. No acute osseous injury of the right hip.
2. Severe osteoarthritis of the right hip.
3. Paget's disease of the right hemipelvis.
4. Chronic 7.2 x 2.6 cm complex fluid collection in the subcutaneous
fat overlying the right gluteus maximus muscle unchanged compared
with 07/02/2006. This likely represents sequela prior trauma.

## 2018-06-03 ENCOUNTER — Encounter: Payer: Self-pay | Admitting: Internal Medicine

## 2019-04-08 ENCOUNTER — Non-Acute Institutional Stay: Payer: Medicare Other | Admitting: Student

## 2019-04-08 ENCOUNTER — Other Ambulatory Visit: Payer: Self-pay

## 2019-04-08 DIAGNOSIS — Z515 Encounter for palliative care: Secondary | ICD-10-CM

## 2019-04-08 NOTE — Progress Notes (Addendum)
[place] Consult Note Telephone: [phone]  Fax: [phone]  PATIENT NAME: Becky Morales DOB: [birth year] MRN: [id number]  PRIMARY CARE PROVIDER:   Patient, No Pcp Per  REFERRING PROVIDER:  No referring provider defined for this encounter.  RESPONSIBLE PARTY:  Bayard Hugger [phone]   ASSESSMENT:Due to the COVID-19 crisis, this visit was done via telemedicine and it was initiated and consent by this patient and or family. [name] is in common area about to play bingo. NP explained reason for visit. She has pleasant affect. She answers questions. Discussed with ADON Eulis Canner and left message for patient's cousin [name]; awaiting return call. Per Eulis Canner, patient wants to be kept comfort, she does not want additional testing, labs drawn. Patient is a DNR and MOST form is on file at facility. Palliative Medicine will continue to follow, provide support and make recommendations as needed.           RECOMMENDATIONS and PLAN:  1. Code status: DNR; MOST on file at facility. 2. Medical goals of therapy/symptom management: For patient to be kept comfortable. Dysphagia: continue mechanical soft diet with pureed meats, thin liquids. Palliative Medicine will monitor for changes and declines; will make recommendations as needed.  3. Discharge Planning: [name] will continue to reside at [facility].  Left Message for family member, Renea Ee, awaiting return call.  Palliative Medicine will follow up in 8 weeks or sooner, if needed.  I spent 35 minutes providing this consultation,  From 2:50pm to 3:25pm. More than 50% of the time in this consultation was spent coordinating communication.   HISTORY OF PRESENT ILLNESS:  Becky Morales is a 85 y.o.  female with multiple medical problems including atherosclerosis of native arteries of extremities-bilateral legs, peripheral vascular disease, constipation,  dysphagia-oropharyngeal phase, allergic rhinitis, hyponatremia, osteoarthritis, vascular dementia without behavioral disturbance, chronic conjunctivitis, unspecified glaucoma, essential hypertension, chronic diastolic congestive heart failure, severe protein calorie malnutrition, hyperlipidemia, hypomagnesemia, anemia, gerd without esophagitis. Palliative Care was asked to help address goals of care. She presently resides at [facility] [place]. Patient was recently evaluated for Hospice services, but it was determined that referral should have been for Palliative. Patient had multiple testing performed and it was decided that she did not want to have any more test performed, labs drawn. Patient reports doing okay. She is out of bed daily to wheel chair; she propels herself around. Her appetite is fair; she eats around 50% of meals. She is receiving a mechanical soft diet, pureed meats and thin liquids. Weight on 03/24/2019 was 110.2 pounds She has excoriation to buttocks, nystatin powder being applied. Otherwise, no skin breakdown reported. No sleep difficulty reported. No recent emergency room visits or hospitalizations.    CODE STATUS: DNR  PPS: 40% HOSPICE ELIGIBILITY/DIAGNOSIS: TBD  PAST MEDICAL HISTORY:  Past Medical History:  Diagnosis Date  . ALLERGIC RHINITIS 04/13/2007  . Arthritis   . CAD (coronary artery disease)    NSTEMI.  LHC 09/03/11: pLAD 40-50%, pDx 80-90% (very small), lateral branch of OM 99%, m-dCFX 40%, oRCA 80%, m-dRCA 50-70%, EF 55%.  She had PCI with BMS to lateral branch of the OM.  RCA was tx medically.   . Cancer ([place])   . CARCINOID TUMOR 04/13/2007  . CHOLELITHIASIS 05/02/2010  . Dermatophytosis of nail   . Diphtheria    age 41  . Environmental allergies    has mucus in throat  .  GERD 04/13/2007  . HYPERGLYCEMIA 04/13/2007  . HYPERTENSION 04/13/2007  . Hyponatremia   . Murmur    echo 11/12: EF 55-60%, mod calcified AV annulus, mild AI, mild MR  . UTI (urinary  tract infection) 03/2015    SOCIAL HX:  Social History   Tobacco Use  . Smoking status: Never Smoker  . Smokeless tobacco: Never Used  Substance Use Topics  . Alcohol use: No    ALLERGIES:  Allergies  Allergen Reactions  . Ciprofloxacin     emesis  . Paxil [Paroxetine Hcl] Nausea And Vomiting     PERTINENT MEDICATIONS:  Outpatient Encounter Medications as of 04/08/2019  Medication Sig  . acetaminophen (TYLENOL) 650 MG suppository Place 650 mg rectally every 4 (four) hours as needed.  Marland Kitchen amLODipine (NORVASC) 10 MG tablet Take 10 mg by mouth daily. For HTN  . aspirin 81 MG tablet Take 81 mg by mouth daily.   Marland Kitchen docusate sodium (COLACE) 100 MG capsule Take 100 mg by mouth 2 (two) times daily.  Marland Kitchen loperamide (IMODIUM) 2 MG capsule Take 4 mg by mouth as needed for diarrhea or loose stools.  Marland Kitchen loratadine (CLARITIN) 10 MG tablet Take 10 mg by mouth daily.  Marland Kitchen losartan (COZAAR) 50 MG tablet Take 50 mg by mouth daily.  Marland Kitchen menthol-cetylpyridinium (CEPACOL) 3 MG lozenge Take 1 lozenge by mouth as needed for sore throat.  . metoprolol tartrate (LOPRESSOR) 50 MG tablet Take 1 tablet (50 mg total) by mouth 2 (two) times daily.  . Multiple Vitamin (MULTI VITAMIN DAILY) TABS Take 1 tablet by mouth daily. Pt. Not sure of dose.  . Multiple Vitamins-Minerals (PRESERVISION AREDS 2) CAPS Take 1 capsule by mouth 2 (two) times daily.  . nitroGLYCERIN (NITROSTAT) 0.4 MG SL tablet Place 1 tablet (0.4 mg total) under the tongue every 5 (five) minutes as needed for chest pain.  Marland Kitchen nystatin cream (MYCOSTATIN) Apply 1 application topically 2 (two) times daily.  Marland Kitchen omeprazole (PRILOSEC) 20 MG capsule Take 20 mg by mouth daily. May sprinkle over food.  . ondansetron (ZOFRAN) 4 MG tablet Take 4 mg by mouth every 4 (four) hours as needed for nausea or vomiting.  . pregabalin (LYRICA) 50 MG capsule Take one capsule by mouth three times daily for neuropathy (Patient taking differently: 50 mg 2 (two) times daily. Take one  capsule by mouth three times daily for neuropathy)  . Propylene Glycol (SYSTANE BALANCE) 0.6 % SOLN Apply 1 drop to eye 2 (two) times daily. Both eyes for dry eye  . senna-docusate (SENOKOT-S) 8.6-50 MG tablet Take 1 tablet by mouth at bedtime.  . sodium chloride 1 G tablet Take 1 g by mouth daily.   . traMADol (ULTRAM) 50 MG tablet 50 mg daily.   Marland Kitchen ZINC OXIDE, TOPICAL, 25 % OINT Apply 1 application topically daily.  Marland Kitchen acetaminophen (TYLENOL) 500 MG tablet Take 1,000 mg by mouth 2 (two) times daily.  . bisacodyl (DULCOLAX) 10 MG suppository Place 1 suppository (10 mg total) rectally daily as needed for moderate constipation. (Patient not taking: Reported on 04/08/2019)  . calcium-vitamin D (OSCAL WITH D) 500-200 MG-UNIT per tablet Take 1 tablet by mouth 2 (two) times daily.  Marland Kitchen ipratropium-albuterol (DUONEB) 0.5-2.5 (3) MG/3ML SOLN Take 3 mLs by nebulization every 6 (six) hours as needed.  . sennosides-docusate sodium (SENOKOT-S) 8.6-50 MG tablet Take 1 tablet by mouth 2 (two) times daily.  Marland Kitchen UNABLE TO FIND Take 60 mLs by mouth 2 (two) times daily. Med Name: MedPass 2.0 (  Document % consumed)  . valsartan (DIOVAN) 320 MG tablet Take 1 tablet (320 mg total) by mouth daily. (Patient not taking: Reported on 04/08/2019)  . [DISCONTINUED] potassium chloride (K-DUR,KLOR-CON) 10 MEQ tablet Take 1 tablet (10 mEq total) by mouth daily.   No facility-administered encounter medications on file as of 04/08/2019.     PHYSICAL EXAM:   General: NAD, frail appearing, thin  Ezekiel Slocumb, NP

## 2019-04-09 ENCOUNTER — Telehealth: Payer: Self-pay | Admitting: Student

## 2019-04-09 NOTE — Telephone Encounter (Signed)
Palliative NP received return call from patient's responsible party, Renea Ee. We discussed role of Palliative Medicine. We discussed goals of care and symptom management. She is given the opportunity to ask questions. She denies needs at this time.

## 2019-08-27 ENCOUNTER — Non-Acute Institutional Stay: Payer: Medicare Other | Admitting: Adult Health Nurse Practitioner

## 2019-08-27 ENCOUNTER — Other Ambulatory Visit: Payer: Self-pay

## 2019-08-27 DIAGNOSIS — Z515 Encounter for palliative care: Secondary | ICD-10-CM

## 2019-08-28 NOTE — Progress Notes (Signed)
[place] Consult Note Telephone: [phone]  Fax: [phone]  PATIENT NAME: Becky Morales DOB: 09-16-1920 MRN: [id number]  PRIMARY CARE PROVIDER:   Patient, No Pcp Per  REFERRING PROVIDER:  No referring provider defined for this encounter.  RESPONSIBLE PARTY:      Bayard Hugger [phone]     RECOMMENDATIONS and PLAN:  1.  Advanced care planning.  Patient is a DNR.  No changes made today  2.  Comorbidities.  Medical history includes vascular dementia, CH, HTN, PVD, OA.  Patient does state that her bottom hurts.  Staff reports that she has excoriation and redness to her bottom.  Is being followed by wound nurse at facility.  Currently are using zinc oxide and repositioning.  Have log book to record when patient is repositioned. Continue current wound management. Denies SOB, cough, chest pain, N/V/D, constipation. No reported changes in appetite and weight has been stable.  No reports of falls, infection, or hospitalization since last visit.  Continue current plan of care and palliative will continue to monitor for symptom management and/or decline and will make recommendations as needed  I spent 30 minutes providing this consultation,  from 11:15 to 11:45. More than 50% of the time in this consultation was spent coordinating communication.   HISTORY OF PRESENT ILLNESS:  Becky Morales is a 85 y.o. year old female with multiple medical problems including atherosclerosis of native arteries of extremities-bilateral legs, peripheral vascular disease, constipation, dysphagia-oropharyngeal phase, allergic rhinitis, hyponatremia, osteoarthritis, vascular dementia without behavioral disturbance, chronic conjunctivitis, unspecified glaucoma, essential hypertension, chronic diastolic congestive heart failure, severe protein calorie malnutrition, hyperlipidemia, hypomagnesemia, anemia, gerd without esophagitis. Palliative Care was asked to help  address goals of care.   CODE STATUS: DNR  PPS: 40% HOSPICE ELIGIBILITY/DIAGNOSIS: TBD  PHYSICAL EXAM:   General: NAD, frail appearing, thin Cardiovascular: regular rate and rhythm Pulmonary: lung sounds clear; normal respiratory effort Abdomen: soft, nontender, + bowel sounds GU: no suprapubic tenderness Extremities: no edema, no joint deformities Neurological: Weakness; A&O to person and place   PAST MEDICAL HISTORY:  Past Medical History:  Diagnosis Date  . ALLERGIC RHINITIS 04/13/2007  . Arthritis   . CAD (coronary artery disease)    NSTEMI.  LHC 09/03/11: pLAD 40-50%, pDx 80-90% (very small), lateral branch of OM 99%, m-dCFX 40%, oRCA 80%, m-dRCA 50-70%, EF 55%.  She had PCI with BMS to lateral branch of the OM.  RCA was tx medically.   . Cancer ([place])   . CARCINOID TUMOR 04/13/2007  . CHOLELITHIASIS 05/02/2010  . Dermatophytosis of nail   . Diphtheria    age 24  . Environmental allergies    has mucus in throat  . GERD 04/13/2007  . HYPERGLYCEMIA 04/13/2007  . HYPERTENSION 04/13/2007  . Hyponatremia   . Murmur    echo 11/12: EF 55-60%, mod calcified AV annulus, mild AI, mild MR  . UTI (urinary tract infection) 03/2015    SOCIAL HX:  Social History   Tobacco Use  . Smoking status: Never Smoker  . Smokeless tobacco: Never Used  Substance Use Topics  . Alcohol use: No    ALLERGIES:  Allergies  Allergen Reactions  . Ciprofloxacin     emesis  . Paxil [Paroxetine Hcl] Nausea And Vomiting     PERTINENT MEDICATIONS:  Outpatient Encounter Medications as of 08/27/2019  Medication Sig  . acetaminophen (TYLENOL) 500 MG tablet Take 1,000 mg by mouth 2 (two) times daily.  Marland Kitchen acetaminophen (  TYLENOL) 650 MG suppository Place 650 mg rectally every 4 (four) hours as needed.  Marland Kitchen amLODipine (NORVASC) 10 MG tablet Take 10 mg by mouth daily. For HTN  . aspirin 81 MG tablet Take 81 mg by mouth daily.   . bisacodyl (DULCOLAX) 10 MG suppository Place 1 suppository (10 mg total)  rectally daily as needed for moderate constipation. (Patient not taking: Reported on 04/08/2019)  . calcium-vitamin D (OSCAL WITH D) 500-200 MG-UNIT per tablet Take 1 tablet by mouth 2 (two) times daily.  Marland Kitchen docusate sodium (COLACE) 100 MG capsule Take 100 mg by mouth 2 (two) times daily.  Marland Kitchen ipratropium-albuterol (DUONEB) 0.5-2.5 (3) MG/3ML SOLN Take 3 mLs by nebulization every 6 (six) hours as needed.  . loperamide (IMODIUM) 2 MG capsule Take 4 mg by mouth as needed for diarrhea or loose stools.  Marland Kitchen loratadine (CLARITIN) 10 MG tablet Take 10 mg by mouth daily.  Marland Kitchen losartan (COZAAR) 50 MG tablet Take 50 mg by mouth daily.  Marland Kitchen menthol-cetylpyridinium (CEPACOL) 3 MG lozenge Take 1 lozenge by mouth as needed for sore throat.  . metoprolol tartrate (LOPRESSOR) 50 MG tablet Take 1 tablet (50 mg total) by mouth 2 (two) times daily.  . Multiple Vitamin (MULTI VITAMIN DAILY) TABS Take 1 tablet by mouth daily. Pt. Not sure of dose.  . Multiple Vitamins-Minerals (PRESERVISION AREDS 2) CAPS Take 1 capsule by mouth 2 (two) times daily.  . nitroGLYCERIN (NITROSTAT) 0.4 MG SL tablet Place 1 tablet (0.4 mg total) under the tongue every 5 (five) minutes as needed for chest pain.  Marland Kitchen nystatin cream (MYCOSTATIN) Apply 1 application topically 2 (two) times daily.  Marland Kitchen omeprazole (PRILOSEC) 20 MG capsule Take 20 mg by mouth daily. May sprinkle over food.  . ondansetron (ZOFRAN) 4 MG tablet Take 4 mg by mouth every 4 (four) hours as needed for nausea or vomiting.  . pregabalin (LYRICA) 50 MG capsule Take one capsule by mouth three times daily for neuropathy (Patient taking differently: 50 mg 2 (two) times daily. Take one capsule by mouth three times daily for neuropathy)  . Propylene Glycol (SYSTANE BALANCE) 0.6 % SOLN Apply 1 drop to eye 2 (two) times daily. Both eyes for dry eye  . senna-docusate (SENOKOT-S) 8.6-50 MG tablet Take 1 tablet by mouth at bedtime.  . sennosides-docusate sodium (SENOKOT-S) 8.6-50 MG tablet Take 1  tablet by mouth 2 (two) times daily.  . sodium chloride 1 G tablet Take 1 g by mouth daily.   . traMADol (ULTRAM) 50 MG tablet 50 mg daily.   Marland Kitchen UNABLE TO FIND Take 60 mLs by mouth 2 (two) times daily. Med Name: MedPass 2.0 (Document % consumed)  . valsartan (DIOVAN) 320 MG tablet Take 1 tablet (320 mg total) by mouth daily. (Patient not taking: Reported on 04/08/2019)  . ZINC OXIDE, TOPICAL, 25 % OINT Apply 1 application topically daily.   No facility-administered encounter medications on file as of 08/27/2019.       Jenetta Downer, NP

## 2019-10-22 DEATH — deceased
